# Patient Record
Sex: Female | Born: 1970 | Race: Black or African American | Hispanic: No | Marital: Single | Smoking: Never smoker
Health system: Southern US, Community
[De-identification: ages and names within clinical notes are randomized; demographics above are authoritative.]

## PROBLEM LIST (undated history)

## (undated) DIAGNOSIS — C55 Malignant neoplasm of uterus, part unspecified: Secondary | ICD-10-CM

## (undated) DIAGNOSIS — K56609 Unspecified intestinal obstruction, unspecified as to partial versus complete obstruction: Secondary | ICD-10-CM

## (undated) DIAGNOSIS — O1490 Unspecified pre-eclampsia, unspecified trimester: Secondary | ICD-10-CM

## (undated) DIAGNOSIS — K567 Ileus, unspecified: Secondary | ICD-10-CM

## (undated) DIAGNOSIS — I10 Essential (primary) hypertension: Secondary | ICD-10-CM

## (undated) DIAGNOSIS — Z8759 Personal history of other complications of pregnancy, childbirth and the puerperium: Secondary | ICD-10-CM

## (undated) DIAGNOSIS — D509 Iron deficiency anemia, unspecified: Secondary | ICD-10-CM

## (undated) DIAGNOSIS — K9189 Other postprocedural complications and disorders of digestive system: Secondary | ICD-10-CM

## (undated) HISTORY — DX: Ileus, unspecified: K56.7

## (undated) HISTORY — DX: Iron deficiency anemia, unspecified: D50.9

## (undated) HISTORY — DX: Other postprocedural complications and disorders of digestive system: K91.89

## (undated) HISTORY — PX: OTHER SURGICAL HISTORY: SHX169

## (undated) HISTORY — DX: Unspecified pre-eclampsia, unspecified trimester: O14.90

## (undated) HISTORY — DX: Personal history of other complications of pregnancy, childbirth and the puerperium: Z87.59

## (undated) HISTORY — PX: APPENDECTOMY: SHX54

---

## 1999-12-09 ENCOUNTER — Emergency Department (HOSPITAL_COMMUNITY): Admission: EM | Admit: 1999-12-09 | Discharge: 1999-12-09 | Payer: Self-pay | Admitting: Emergency Medicine

## 2000-03-24 ENCOUNTER — Emergency Department (HOSPITAL_COMMUNITY): Admission: EM | Admit: 2000-03-24 | Discharge: 2000-03-24 | Payer: Self-pay | Admitting: Emergency Medicine

## 2000-07-28 ENCOUNTER — Other Ambulatory Visit: Admission: RE | Admit: 2000-07-28 | Discharge: 2000-07-28 | Payer: Self-pay | Admitting: *Deleted

## 2000-09-27 ENCOUNTER — Ambulatory Visit (HOSPITAL_COMMUNITY): Admission: RE | Admit: 2000-09-27 | Discharge: 2000-09-27 | Payer: Self-pay | Admitting: *Deleted

## 2000-09-27 ENCOUNTER — Encounter: Payer: Self-pay | Admitting: *Deleted

## 2000-11-01 ENCOUNTER — Encounter: Payer: Self-pay | Admitting: Family Medicine

## 2000-11-01 ENCOUNTER — Encounter: Admission: RE | Admit: 2000-11-01 | Discharge: 2000-11-01 | Payer: Self-pay | Admitting: Family Medicine

## 2001-01-16 ENCOUNTER — Encounter: Payer: Self-pay | Admitting: *Deleted

## 2001-01-16 ENCOUNTER — Inpatient Hospital Stay (HOSPITAL_COMMUNITY): Admission: AD | Admit: 2001-01-16 | Discharge: 2001-01-16 | Payer: Self-pay | Admitting: *Deleted

## 2001-02-19 ENCOUNTER — Emergency Department (HOSPITAL_COMMUNITY): Admission: EM | Admit: 2001-02-19 | Discharge: 2001-02-19 | Payer: Self-pay | Admitting: Emergency Medicine

## 2001-08-30 ENCOUNTER — Inpatient Hospital Stay (HOSPITAL_COMMUNITY): Admission: AD | Admit: 2001-08-30 | Discharge: 2001-08-30 | Payer: Self-pay | Admitting: Obstetrics and Gynecology

## 2001-09-08 ENCOUNTER — Inpatient Hospital Stay (HOSPITAL_COMMUNITY): Admission: AD | Admit: 2001-09-08 | Discharge: 2001-09-13 | Payer: Self-pay | Admitting: Obstetrics and Gynecology

## 2001-09-16 ENCOUNTER — Inpatient Hospital Stay (HOSPITAL_COMMUNITY): Admission: AD | Admit: 2001-09-16 | Discharge: 2001-09-16 | Payer: Self-pay | Admitting: Obstetrics and Gynecology

## 2001-10-28 ENCOUNTER — Emergency Department (HOSPITAL_COMMUNITY): Admission: EM | Admit: 2001-10-28 | Discharge: 2001-10-28 | Payer: Self-pay | Admitting: *Deleted

## 2001-12-17 ENCOUNTER — Emergency Department (HOSPITAL_COMMUNITY): Admission: EM | Admit: 2001-12-17 | Discharge: 2001-12-17 | Payer: Self-pay | Admitting: Emergency Medicine

## 2001-12-17 ENCOUNTER — Encounter: Payer: Self-pay | Admitting: Emergency Medicine

## 2002-02-13 ENCOUNTER — Emergency Department (HOSPITAL_COMMUNITY): Admission: EM | Admit: 2002-02-13 | Discharge: 2002-02-13 | Payer: Self-pay | Admitting: Emergency Medicine

## 2002-07-28 ENCOUNTER — Emergency Department (HOSPITAL_COMMUNITY): Admission: EM | Admit: 2002-07-28 | Discharge: 2002-07-28 | Payer: Self-pay | Admitting: Emergency Medicine

## 2002-08-25 ENCOUNTER — Emergency Department (HOSPITAL_COMMUNITY): Admission: EM | Admit: 2002-08-25 | Discharge: 2002-08-26 | Payer: Self-pay | Admitting: Emergency Medicine

## 2003-01-06 ENCOUNTER — Emergency Department (HOSPITAL_COMMUNITY): Admission: EM | Admit: 2003-01-06 | Discharge: 2003-01-07 | Payer: Self-pay | Admitting: Emergency Medicine

## 2003-12-31 ENCOUNTER — Inpatient Hospital Stay (HOSPITAL_COMMUNITY): Admission: AD | Admit: 2003-12-31 | Discharge: 2004-01-02 | Payer: Self-pay | Admitting: Obstetrics and Gynecology

## 2004-01-28 ENCOUNTER — Inpatient Hospital Stay (HOSPITAL_COMMUNITY): Admission: AD | Admit: 2004-01-28 | Discharge: 2004-01-28 | Payer: Self-pay | Admitting: Obstetrics and Gynecology

## 2004-01-30 ENCOUNTER — Other Ambulatory Visit: Admission: RE | Admit: 2004-01-30 | Discharge: 2004-01-30 | Payer: Self-pay | Admitting: Obstetrics and Gynecology

## 2004-03-26 ENCOUNTER — Ambulatory Visit (HOSPITAL_COMMUNITY): Admission: RE | Admit: 2004-03-26 | Discharge: 2004-03-26 | Payer: Self-pay | Admitting: Obstetrics and Gynecology

## 2004-04-09 ENCOUNTER — Ambulatory Visit (HOSPITAL_COMMUNITY): Admission: RE | Admit: 2004-04-09 | Discharge: 2004-04-09 | Payer: Self-pay | Admitting: Obstetrics and Gynecology

## 2004-07-25 ENCOUNTER — Inpatient Hospital Stay (HOSPITAL_COMMUNITY): Admission: AD | Admit: 2004-07-25 | Discharge: 2004-08-03 | Payer: Self-pay | Admitting: Obstetrics and Gynecology

## 2004-07-27 ENCOUNTER — Encounter (INDEPENDENT_AMBULATORY_CARE_PROVIDER_SITE_OTHER): Payer: Self-pay | Admitting: *Deleted

## 2004-10-27 ENCOUNTER — Emergency Department (HOSPITAL_COMMUNITY): Admission: EM | Admit: 2004-10-27 | Discharge: 2004-10-27 | Payer: Self-pay | Admitting: Emergency Medicine

## 2004-10-31 ENCOUNTER — Emergency Department (HOSPITAL_COMMUNITY): Admission: EM | Admit: 2004-10-31 | Discharge: 2004-11-01 | Payer: Self-pay | Admitting: Emergency Medicine

## 2005-02-27 ENCOUNTER — Emergency Department (HOSPITAL_COMMUNITY): Admission: EM | Admit: 2005-02-27 | Discharge: 2005-02-27 | Payer: Self-pay | Admitting: Emergency Medicine

## 2005-06-04 ENCOUNTER — Ambulatory Visit: Payer: Self-pay | Admitting: Nurse Practitioner

## 2005-06-05 ENCOUNTER — Ambulatory Visit (HOSPITAL_COMMUNITY): Admission: RE | Admit: 2005-06-05 | Discharge: 2005-06-05 | Payer: Self-pay | Admitting: Internal Medicine

## 2005-08-06 ENCOUNTER — Ambulatory Visit: Payer: Self-pay | Admitting: Nurse Practitioner

## 2005-08-07 ENCOUNTER — Ambulatory Visit: Payer: Self-pay | Admitting: Nurse Practitioner

## 2005-08-07 ENCOUNTER — Ambulatory Visit: Payer: Self-pay | Admitting: *Deleted

## 2005-09-03 ENCOUNTER — Ambulatory Visit: Payer: Self-pay | Admitting: Internal Medicine

## 2005-11-12 ENCOUNTER — Ambulatory Visit: Payer: Self-pay | Admitting: Nurse Practitioner

## 2006-02-23 ENCOUNTER — Ambulatory Visit: Payer: Self-pay | Admitting: Nurse Practitioner

## 2006-03-04 ENCOUNTER — Ambulatory Visit: Payer: Self-pay | Admitting: Nurse Practitioner

## 2006-03-18 ENCOUNTER — Ambulatory Visit: Payer: Self-pay | Admitting: Nurse Practitioner

## 2006-03-18 ENCOUNTER — Ambulatory Visit (HOSPITAL_COMMUNITY): Admission: RE | Admit: 2006-03-18 | Discharge: 2006-03-18 | Payer: Self-pay | Admitting: Nurse Practitioner

## 2006-09-29 ENCOUNTER — Encounter (INDEPENDENT_AMBULATORY_CARE_PROVIDER_SITE_OTHER): Payer: Self-pay | Admitting: *Deleted

## 2007-10-29 ENCOUNTER — Emergency Department (HOSPITAL_COMMUNITY): Admission: EM | Admit: 2007-10-29 | Discharge: 2007-10-30 | Payer: Self-pay | Admitting: Emergency Medicine

## 2007-12-31 ENCOUNTER — Emergency Department (HOSPITAL_COMMUNITY): Admission: EM | Admit: 2007-12-31 | Discharge: 2007-12-31 | Payer: Self-pay | Admitting: Emergency Medicine

## 2009-04-14 ENCOUNTER — Inpatient Hospital Stay (HOSPITAL_COMMUNITY): Admission: AD | Admit: 2009-04-14 | Discharge: 2009-04-14 | Payer: Self-pay | Admitting: Obstetrics and Gynecology

## 2009-05-09 ENCOUNTER — Inpatient Hospital Stay (HOSPITAL_COMMUNITY): Admission: AD | Admit: 2009-05-09 | Discharge: 2009-05-09 | Payer: Self-pay | Admitting: Obstetrics & Gynecology

## 2009-05-15 ENCOUNTER — Inpatient Hospital Stay (HOSPITAL_COMMUNITY): Admission: AD | Admit: 2009-05-15 | Discharge: 2009-05-15 | Payer: Self-pay | Admitting: Family Medicine

## 2009-05-15 ENCOUNTER — Ambulatory Visit: Payer: Self-pay | Admitting: Advanced Practice Midwife

## 2009-05-23 ENCOUNTER — Ambulatory Visit: Payer: Self-pay | Admitting: Obstetrics & Gynecology

## 2009-05-23 LAB — CONVERTED CEMR LAB
ALT: 11 units/L (ref 0–35)
AST: 14 units/L (ref 0–37)
Albumin: 4 g/dL (ref 3.5–5.2)
Alkaline Phosphatase: 58 units/L (ref 39–117)
Calcium: 8.8 mg/dL (ref 8.4–10.5)
Chloride: 105 meq/L (ref 96–112)
Eosinophils Relative: 2 % (ref 0–5)
HCT: 36.3 % (ref 36.0–46.0)
Hepatitis B Surface Ag: NEGATIVE
Hgb A2 Quant: 2.8 % (ref 2.2–3.2)
Hgb A: 97.2 % (ref 96.8–97.8)
Lymphocytes Relative: 28 % (ref 12–46)
MCV: 83.4 fL (ref 78.0–100.0)
Monocytes Absolute: 0.4 10*3/uL (ref 0.1–1.0)
Monocytes Relative: 5 % (ref 3–12)
Neutrophils Relative %: 65 % (ref 43–77)
Potassium: 3.8 meq/L (ref 3.5–5.3)
RDW: 15.1 % (ref 11.5–15.5)
Sodium: 135 meq/L (ref 135–145)
Total Bilirubin: 0.2 mg/dL — ABNORMAL LOW (ref 0.3–1.2)
Total Protein: 7.4 g/dL (ref 6.0–8.3)
Uric Acid, Serum: 3.6 mg/dL (ref 2.4–7.0)
WBC: 8 10*3/uL (ref 4.0–10.5)

## 2009-05-24 ENCOUNTER — Encounter: Payer: Self-pay | Admitting: Obstetrics & Gynecology

## 2009-05-24 LAB — CONVERTED CEMR LAB
Creatinine 24 HR UR: 778 mg/24hr (ref 700–1800)
Creatinine Clearance: 83 mL/min (ref 75–115)
Creatinine, Urine: 88.9 mg/dL

## 2009-05-25 ENCOUNTER — Inpatient Hospital Stay (HOSPITAL_COMMUNITY): Admission: AD | Admit: 2009-05-25 | Discharge: 2009-05-25 | Payer: Self-pay | Admitting: Obstetrics and Gynecology

## 2009-06-06 ENCOUNTER — Ambulatory Visit: Payer: Self-pay | Admitting: Obstetrics & Gynecology

## 2009-06-26 ENCOUNTER — Ambulatory Visit: Payer: Self-pay | Admitting: Obstetrics and Gynecology

## 2009-07-03 ENCOUNTER — Ambulatory Visit: Payer: Self-pay | Admitting: Obstetrics and Gynecology

## 2009-07-12 ENCOUNTER — Encounter: Payer: Self-pay | Admitting: Family Medicine

## 2009-07-12 ENCOUNTER — Ambulatory Visit (HOSPITAL_COMMUNITY): Admission: RE | Admit: 2009-07-12 | Discharge: 2009-07-12 | Payer: Self-pay | Admitting: Family Medicine

## 2009-07-19 ENCOUNTER — Ambulatory Visit (HOSPITAL_COMMUNITY): Admission: RE | Admit: 2009-07-19 | Discharge: 2009-07-19 | Payer: Self-pay | Admitting: Family Medicine

## 2009-07-19 ENCOUNTER — Encounter: Payer: Self-pay | Admitting: Family Medicine

## 2009-07-24 ENCOUNTER — Ambulatory Visit: Payer: Self-pay | Admitting: Obstetrics and Gynecology

## 2009-07-25 ENCOUNTER — Encounter: Payer: Self-pay | Admitting: Family Medicine

## 2009-07-25 ENCOUNTER — Ambulatory Visit: Payer: Self-pay | Admitting: Obstetrics and Gynecology

## 2009-07-25 ENCOUNTER — Inpatient Hospital Stay (HOSPITAL_COMMUNITY)
Admission: AD | Admit: 2009-07-25 | Discharge: 2009-07-27 | Payer: Self-pay | Source: Home / Self Care | Admitting: Obstetrics and Gynecology

## 2009-08-01 ENCOUNTER — Ambulatory Visit: Payer: Self-pay | Admitting: Family Medicine

## 2009-08-08 ENCOUNTER — Ambulatory Visit: Payer: Self-pay | Admitting: Obstetrics & Gynecology

## 2009-08-08 ENCOUNTER — Ambulatory Visit (HOSPITAL_COMMUNITY): Admission: RE | Admit: 2009-08-08 | Discharge: 2009-08-08 | Payer: Self-pay | Admitting: Family Medicine

## 2009-08-22 ENCOUNTER — Ambulatory Visit: Payer: Self-pay | Admitting: Obstetrics & Gynecology

## 2009-08-29 ENCOUNTER — Encounter: Payer: Self-pay | Admitting: Family Medicine

## 2009-08-29 ENCOUNTER — Ambulatory Visit (HOSPITAL_COMMUNITY): Admission: RE | Admit: 2009-08-29 | Discharge: 2009-08-29 | Payer: Self-pay | Admitting: Family Medicine

## 2009-08-29 ENCOUNTER — Ambulatory Visit: Payer: Self-pay | Admitting: Obstetrics & Gynecology

## 2009-09-26 ENCOUNTER — Ambulatory Visit: Payer: Self-pay | Admitting: Family Medicine

## 2009-09-26 ENCOUNTER — Encounter: Payer: Self-pay | Admitting: Obstetrics and Gynecology

## 2009-09-30 ENCOUNTER — Ambulatory Visit (HOSPITAL_COMMUNITY): Admission: RE | Admit: 2009-09-30 | Discharge: 2009-09-30 | Payer: Self-pay | Admitting: Family Medicine

## 2009-10-07 ENCOUNTER — Ambulatory Visit: Payer: Self-pay | Admitting: Obstetrics & Gynecology

## 2009-10-10 ENCOUNTER — Ambulatory Visit: Payer: Self-pay | Admitting: Obstetrics & Gynecology

## 2009-10-14 ENCOUNTER — Encounter (INDEPENDENT_AMBULATORY_CARE_PROVIDER_SITE_OTHER): Payer: Self-pay | Admitting: *Deleted

## 2009-10-14 ENCOUNTER — Ambulatory Visit (HOSPITAL_COMMUNITY): Admission: RE | Admit: 2009-10-14 | Discharge: 2009-10-14 | Payer: Self-pay | Admitting: Family Medicine

## 2009-10-14 ENCOUNTER — Ambulatory Visit: Payer: Self-pay | Admitting: Obstetrics & Gynecology

## 2009-10-14 LAB — CONVERTED CEMR LAB
ALT: 10 units/L (ref 0–35)
AST: 13 units/L (ref 0–37)
Albumin: 3.4 g/dL — ABNORMAL LOW (ref 3.5–5.2)
Alkaline Phosphatase: 142 units/L — ABNORMAL HIGH (ref 39–117)
Chloride: 107 meq/L (ref 96–112)
Creatinine, Ser: 0.62 mg/dL (ref 0.40–1.20)
Creatinine, Urine: 127.3 mg/dL
HCT: 30.8 % — ABNORMAL LOW (ref 36.0–46.0)
Hemoglobin: 9.9 g/dL — ABNORMAL LOW (ref 12.0–15.0)
MCHC: 32.1 g/dL (ref 30.0–36.0)
Platelets: 243 10*3/uL (ref 150–400)
Protein, Ur: 63 mg/24hr (ref 50–100)
Sodium: 138 meq/L (ref 135–145)
Total Bilirubin: 0.3 mg/dL (ref 0.3–1.2)
Uric Acid, Serum: 5.3 mg/dL (ref 2.4–7.0)
WBC: 12.5 10*3/uL — ABNORMAL HIGH (ref 4.0–10.5)

## 2009-10-17 ENCOUNTER — Ambulatory Visit: Payer: Self-pay | Admitting: Obstetrics & Gynecology

## 2009-10-17 ENCOUNTER — Encounter: Payer: Self-pay | Admitting: Family Medicine

## 2009-10-17 ENCOUNTER — Observation Stay (HOSPITAL_COMMUNITY): Admission: AD | Admit: 2009-10-17 | Discharge: 2009-10-18 | Payer: Self-pay | Admitting: Obstetrics & Gynecology

## 2009-10-21 ENCOUNTER — Ambulatory Visit: Payer: Self-pay | Admitting: Obstetrics and Gynecology

## 2009-10-22 ENCOUNTER — Encounter: Payer: Self-pay | Admitting: Family Medicine

## 2009-10-22 ENCOUNTER — Ambulatory Visit (HOSPITAL_COMMUNITY): Admission: RE | Admit: 2009-10-22 | Discharge: 2009-10-22 | Payer: Self-pay | Admitting: Family Medicine

## 2009-10-24 ENCOUNTER — Ambulatory Visit (HOSPITAL_COMMUNITY): Admission: RE | Admit: 2009-10-24 | Discharge: 2009-10-24 | Payer: Self-pay | Admitting: Family Medicine

## 2009-10-24 ENCOUNTER — Ambulatory Visit: Payer: Self-pay | Admitting: Family Medicine

## 2009-10-29 ENCOUNTER — Ambulatory Visit (HOSPITAL_COMMUNITY): Admission: RE | Admit: 2009-10-29 | Discharge: 2009-10-29 | Payer: Self-pay | Admitting: Family Medicine

## 2009-10-31 ENCOUNTER — Ambulatory Visit: Payer: Self-pay | Admitting: Family Medicine

## 2009-11-04 ENCOUNTER — Ambulatory Visit: Payer: Self-pay | Admitting: Family Medicine

## 2009-11-05 ENCOUNTER — Ambulatory Visit (HOSPITAL_COMMUNITY): Admission: RE | Admit: 2009-11-05 | Discharge: 2009-11-05 | Payer: Self-pay | Admitting: Obstetrics & Gynecology

## 2009-11-07 ENCOUNTER — Ambulatory Visit: Payer: Self-pay | Admitting: Obstetrics & Gynecology

## 2009-11-07 LAB — CONVERTED CEMR LAB
Albumin: 3.3 g/dL — ABNORMAL LOW (ref 3.5–5.2)
BUN: 7 mg/dL (ref 6–23)
CO2: 20 meq/L (ref 19–32)
Calcium: 8.5 mg/dL (ref 8.4–10.5)
Chlamydia, DNA Probe: NEGATIVE
Chloride: 106 meq/L (ref 96–112)
Creatinine, Ser: 0.64 mg/dL (ref 0.40–1.20)
GC Probe Amp, Genital: NEGATIVE
Glucose, Bld: 68 mg/dL — ABNORMAL LOW (ref 70–99)
Hemoglobin: 9.7 g/dL — ABNORMAL LOW (ref 12.0–15.0)
MCHC: 30.2 g/dL (ref 30.0–36.0)
MCV: 85.8 fL (ref 78.0–100.0)
Potassium: 4.4 meq/L (ref 3.5–5.3)
Total Bilirubin: 0.4 mg/dL (ref 0.3–1.2)
WBC: 8.6 10*3/uL (ref 4.0–10.5)

## 2009-11-08 ENCOUNTER — Encounter: Payer: Self-pay | Admitting: Obstetrics & Gynecology

## 2009-11-08 LAB — CONVERTED CEMR LAB
Collection Interval-CRCL: 24 hr
Creatinine 24 HR UR: 862 mg/24hr (ref 700–1800)

## 2009-11-12 ENCOUNTER — Inpatient Hospital Stay (HOSPITAL_COMMUNITY)
Admission: AD | Admit: 2009-11-12 | Discharge: 2009-11-18 | Payer: Self-pay | Source: Home / Self Care | Admitting: Obstetrics and Gynecology

## 2009-11-12 ENCOUNTER — Ambulatory Visit: Payer: Self-pay | Admitting: Obstetrics and Gynecology

## 2009-11-12 ENCOUNTER — Encounter: Payer: Self-pay | Admitting: Obstetrics & Gynecology

## 2009-11-13 ENCOUNTER — Encounter: Payer: Self-pay | Admitting: Obstetrics and Gynecology

## 2009-12-04 ENCOUNTER — Inpatient Hospital Stay (HOSPITAL_COMMUNITY): Admission: AD | Admit: 2009-12-04 | Discharge: 2009-12-04 | Payer: Self-pay | Admitting: Obstetrics and Gynecology

## 2009-12-18 ENCOUNTER — Ambulatory Visit: Payer: Self-pay | Admitting: Obstetrics and Gynecology

## 2010-02-01 ENCOUNTER — Encounter: Payer: Self-pay | Admitting: Obstetrics & Gynecology

## 2010-02-02 ENCOUNTER — Encounter: Payer: Self-pay | Admitting: Family Medicine

## 2010-03-25 LAB — TYPE AND SCREEN
Antibody Screen: NEGATIVE
Unit division: 0

## 2010-03-25 LAB — CBC
HCT: 24.6 % — ABNORMAL LOW (ref 36.0–46.0)
HCT: 28.3 % — ABNORMAL LOW (ref 36.0–46.0)
HCT: 31.8 % — ABNORMAL LOW (ref 36.0–46.0)
Hemoglobin: 8 g/dL — ABNORMAL LOW (ref 12.0–15.0)
MCH: 27.4 pg (ref 26.0–34.0)
MCH: 27.4 pg (ref 26.0–34.0)
MCHC: 32.5 g/dL (ref 30.0–36.0)
MCHC: 32.6 g/dL (ref 30.0–36.0)
MCHC: 32.8 g/dL (ref 30.0–36.0)
MCV: 84 fL (ref 78.0–100.0)
MCV: 84.1 fL (ref 78.0–100.0)
Platelets: 208 10*3/uL (ref 150–400)
Platelets: 238 10*3/uL (ref 150–400)
RBC: 2.96 MIL/uL — ABNORMAL LOW (ref 3.87–5.11)
RBC: 3.83 MIL/uL — ABNORMAL LOW (ref 3.87–5.11)
WBC: 10.5 10*3/uL (ref 4.0–10.5)
WBC: 10.8 10*3/uL — ABNORMAL HIGH (ref 4.0–10.5)

## 2010-03-25 LAB — COMPREHENSIVE METABOLIC PANEL
ALT: 15 U/L (ref 0–35)
Albumin: 2.7 g/dL — ABNORMAL LOW (ref 3.5–5.2)
Alkaline Phosphatase: 109 U/L (ref 39–117)
Alkaline Phosphatase: 154 U/L — ABNORMAL HIGH (ref 39–117)
BUN: 7 mg/dL (ref 6–23)
CO2: 23 mEq/L (ref 19–32)
Chloride: 100 mEq/L (ref 96–112)
Chloride: 105 mEq/L (ref 96–112)
Creatinine, Ser: 0.6 mg/dL (ref 0.4–1.2)
Creatinine, Ser: 0.6 mg/dL (ref 0.4–1.2)
GFR calc Af Amer: >60 mL/min (ref >60)
GFR calc non Af Amer: >60 mL/min (ref >60)
Glucose, Bld: 68 mg/dL — ABNORMAL LOW (ref 70–99)
Glucose, Bld: 77 mg/dL (ref 70–99)
Total Protein: 6.1 g/dL (ref 6.0–8.3)

## 2010-03-25 LAB — URINALYSIS, ROUTINE W REFLEX MICROSCOPIC
Glucose, UA: NEGATIVE mg/dL
Ketones, ur: NEGATIVE mg/dL
Protein, ur: NEGATIVE mg/dL
Specific Gravity, Urine: 1.015 (ref 1.005–1.030)
Urobilinogen, UA: 0.2 mg/dL (ref 0.0–1.0)
pH: 7 (ref 5.0–8.0)

## 2010-03-25 LAB — PROTEIN / CREATININE RATIO, URINE: Protein Creatinine Ratio: 0.46 — ABNORMAL HIGH (ref 0.00–0.15)

## 2010-03-26 LAB — COMPREHENSIVE METABOLIC PANEL
ALT: 12 U/L (ref 0–35)
ALT: 15 U/L (ref 0–35)
AST: 15 U/L (ref 0–37)
Albumin: 2.4 g/dL — ABNORMAL LOW (ref 3.5–5.2)
Albumin: 2.6 g/dL — ABNORMAL LOW (ref 3.5–5.2)
Alkaline Phosphatase: 121 U/L — ABNORMAL HIGH (ref 39–117)
Alkaline Phosphatase: 133 U/L — ABNORMAL HIGH (ref 39–117)
BUN: 7 mg/dL (ref 6–23)
CO2: 23 mEq/L (ref 19–32)
Calcium: 8.8 mg/dL (ref 8.4–10.5)
Chloride: 105 mEq/L (ref 96–112)
Creatinine, Ser: 0.61 mg/dL (ref 0.4–1.2)
GFR calc Af Amer: >60 mL/min (ref >60)
GFR calc non Af Amer: >60 mL/min (ref >60)
Glucose, Bld: 80 mg/dL (ref 70–99)
Potassium: 3.7 mEq/L (ref 3.5–5.1)
Potassium: 3.8 mEq/L (ref 3.5–5.1)
Sodium: 134 mEq/L — ABNORMAL LOW (ref 135–145)
Sodium: 135 mEq/L (ref 135–145)
Total Bilirubin: 0.5 mg/dL (ref 0.3–1.2)
Total Protein: 5.8 g/dL — ABNORMAL LOW (ref 6.0–8.3)
Total Protein: 6.4 g/dL (ref 6.0–8.3)

## 2010-03-26 LAB — CBC
HCT: 29.8 % — ABNORMAL LOW (ref 36.0–46.0)
Hemoglobin: 9.9 g/dL — ABNORMAL LOW (ref 12.0–15.0)
MCH: 28 pg (ref 26.0–34.0)
MCHC: 33.2 g/dL (ref 30.0–36.0)
MCV: 84.4 fL (ref 78.0–100.0)
Platelets: 191 10*3/uL (ref 150–400)
Platelets: 211 10*3/uL (ref 150–400)
RBC: 3.53 MIL/uL — ABNORMAL LOW (ref 3.87–5.11)
RDW: 15.1 % (ref 11.5–15.5)
RDW: 15.8 % — ABNORMAL HIGH (ref 11.5–15.5)
WBC: 11 10*3/uL — ABNORMAL HIGH (ref 4.0–10.5)
WBC: 11.9 10*3/uL — ABNORMAL HIGH (ref 4.0–10.5)

## 2010-03-26 LAB — POCT URINALYSIS DIPSTICK
Bilirubin Urine: NEGATIVE
Bilirubin Urine: NEGATIVE
Bilirubin Urine: NEGATIVE
Glucose, UA: NEGATIVE mg/dL
Glucose, UA: NEGATIVE mg/dL
Hgb urine dipstick: NEGATIVE
Hgb urine dipstick: NEGATIVE
Hgb urine dipstick: NEGATIVE
Hgb urine dipstick: NEGATIVE
Ketones, ur: NEGATIVE mg/dL
Nitrite: NEGATIVE
Nitrite: NEGATIVE
Nitrite: NEGATIVE
Nitrite: NEGATIVE
Protein, ur: NEGATIVE mg/dL
Protein, ur: NEGATIVE mg/dL
Protein, ur: NEGATIVE mg/dL
Specific Gravity, Urine: 1.02 (ref 1.005–1.030)
Specific Gravity, Urine: 1.02 (ref 1.005–1.030)
Urobilinogen, UA: 0.2 mg/dL (ref 0.0–1.0)
Urobilinogen, UA: 0.2 mg/dL (ref 0.0–1.0)
Urobilinogen, UA: 0.2 mg/dL (ref 0.0–1.0)
Urobilinogen, UA: 1 mg/dL (ref 0.0–1.0)
pH: 5 (ref 5.0–8.0)
pH: 5.5 (ref 5.0–8.0)
pH: 5.5 (ref 5.0–8.0)

## 2010-03-26 LAB — CREATININE CLEARANCE, URINE, 24 HOUR
Collection Interval-CRCL: 24 hours
Creatinine Clearance: 195 mL/min — ABNORMAL HIGH (ref 75–115)
Creatinine, 24H Ur: 1545 mg/d (ref 700–1800)
Creatinine, Urine: 81.3 mg/dL
Creatinine: 0.55 mg/dL (ref 0.4–1.2)
Urine Total Volume-CRCL: 1900 mL

## 2010-03-26 LAB — PROTEIN, URINE, 24 HOUR
Collection Interval-UPROT: 24 hours
Protein, 24H Urine: 95 mg/d (ref 50–100)
Urine Total Volume-UPROT: 1900 mL

## 2010-03-27 LAB — POCT URINALYSIS DIPSTICK
Bilirubin Urine: NEGATIVE
Glucose, UA: NEGATIVE mg/dL
Hgb urine dipstick: NEGATIVE
Ketones, ur: NEGATIVE mg/dL
Nitrite: NEGATIVE
Nitrite: NEGATIVE
Nitrite: NEGATIVE
Protein, ur: NEGATIVE mg/dL
Protein, ur: NEGATIVE mg/dL
Protein, ur: NEGATIVE mg/dL
Protein, ur: NEGATIVE mg/dL
Specific Gravity, Urine: 1.02 (ref 1.005–1.030)
Urobilinogen, UA: 0.2 mg/dL (ref 0.0–1.0)
Urobilinogen, UA: 0.2 mg/dL (ref 0.0–1.0)
Urobilinogen, UA: 0.2 mg/dL (ref 0.0–1.0)
pH: 5.5 (ref 5.0–8.0)
pH: 5.5 (ref 5.0–8.0)
pH: 6 (ref 5.0–8.0)
pH: 6 (ref 5.0–8.0)

## 2010-03-28 LAB — POCT URINALYSIS DIPSTICK
Nitrite: NEGATIVE
Protein, ur: NEGATIVE mg/dL
Urobilinogen, UA: 0.2 mg/dL (ref 0.0–1.0)
pH: 5.5 (ref 5.0–8.0)

## 2010-03-29 LAB — POCT URINALYSIS DIP (DEVICE)
Bilirubin Urine: NEGATIVE
Glucose, UA: NEGATIVE mg/dL
Glucose, UA: NEGATIVE mg/dL
Nitrite: NEGATIVE
Nitrite: NEGATIVE
Protein, ur: NEGATIVE mg/dL
Urobilinogen, UA: 0.2 mg/dL (ref 0.0–1.0)
Urobilinogen, UA: 0.2 mg/dL (ref 0.0–1.0)

## 2010-03-30 LAB — POCT URINALYSIS DIP (DEVICE)
Bilirubin Urine: NEGATIVE
Bilirubin Urine: NEGATIVE
Glucose, UA: NEGATIVE mg/dL
Ketones, ur: NEGATIVE mg/dL
Nitrite: NEGATIVE
Nitrite: NEGATIVE
Protein, ur: NEGATIVE mg/dL
Protein, ur: 30 mg/dL — AB
Specific Gravity, Urine: 1.025 (ref 1.005–1.030)
pH: 6.5 (ref 5.0–8.0)
pH: 5.5 (ref 5.0–8.0)

## 2010-03-30 LAB — CBC
HCT: 30.1 % — ABNORMAL LOW (ref 36.0–46.0)
Hemoglobin: 10 g/dL — ABNORMAL LOW (ref 12.0–15.0)
MCHC: 33.2 g/dL (ref 30.0–36.0)
RDW: 15.3 % (ref 11.5–15.5)
WBC: 9 10*3/uL (ref 4.0–10.5)

## 2010-03-31 LAB — POCT URINALYSIS DIP (DEVICE)
Bilirubin Urine: NEGATIVE
Glucose, UA: NEGATIVE mg/dL
Hgb urine dipstick: NEGATIVE
Ketones, ur: NEGATIVE mg/dL
Specific Gravity, Urine: 1.02 (ref 1.005–1.030)
Urobilinogen, UA: 0.2 mg/dL (ref 0.0–1.0)

## 2010-04-01 LAB — WET PREP, GENITAL: Trich, Wet Prep: NONE SEEN

## 2010-04-01 LAB — GC/CHLAMYDIA PROBE AMP, GENITAL
Chlamydia, DNA Probe: NEGATIVE
GC Probe Amp, Genital: NEGATIVE

## 2010-04-01 LAB — CBC
MCV: 83 fL (ref 78.0–100.0)
Platelets: 211 10*3/uL (ref 150–400)
WBC: 9 10*3/uL (ref 4.0–10.5)

## 2010-04-01 LAB — POCT URINALYSIS DIP (DEVICE)
Bilirubin Urine: NEGATIVE
Glucose, UA: NEGATIVE mg/dL
Protein, ur: NEGATIVE mg/dL
Urobilinogen, UA: 0.2 mg/dL (ref 0.0–1.0)

## 2010-04-01 LAB — COMPREHENSIVE METABOLIC PANEL
AST: 15 U/L (ref 0–37)
Albumin: 3.2 g/dL — ABNORMAL LOW (ref 3.5–5.2)
Chloride: 107 mEq/L (ref 96–112)
Creatinine, Ser: 0.62 mg/dL (ref 0.4–1.2)
GFR calc Af Amer: >60 mL/min (ref >60)
Potassium: 3.6 mEq/L (ref 3.5–5.1)
Total Bilirubin: 0.3 mg/dL (ref 0.3–1.2)
Total Protein: 6.6 g/dL (ref 6.0–8.3)

## 2010-04-01 LAB — URINALYSIS, ROUTINE W REFLEX MICROSCOPIC
Bilirubin Urine: NEGATIVE
Glucose, UA: NEGATIVE mg/dL
Hgb urine dipstick: NEGATIVE
Specific Gravity, Urine: 1.025 (ref 1.005–1.030)
Urobilinogen, UA: 0.2 mg/dL (ref 0.0–1.0)

## 2010-04-02 LAB — CBC
HCT: 33.1 % — ABNORMAL LOW (ref 36.0–46.0)
Hemoglobin: 10.8 g/dL — ABNORMAL LOW (ref 12.0–15.0)
MCHC: 32.5 g/dL (ref 30.0–36.0)
MCV: 82.7 fL (ref 78.0–100.0)
Platelets: 232 10*3/uL (ref 150–400)
RDW: 15.7 % — ABNORMAL HIGH (ref 11.5–15.5)

## 2010-04-02 LAB — URINALYSIS, ROUTINE W REFLEX MICROSCOPIC
Bilirubin Urine: NEGATIVE
Hgb urine dipstick: NEGATIVE
Ketones, ur: NEGATIVE mg/dL
Nitrite: NEGATIVE
Protein, ur: NEGATIVE mg/dL
Specific Gravity, Urine: 1.01 (ref 1.005–1.030)
Urobilinogen, UA: 0.2 mg/dL (ref 0.0–1.0)

## 2010-04-02 LAB — WET PREP, GENITAL

## 2010-05-30 NOTE — H&P (Signed)
Krystal [name], Krystal [name]               ACCOUNT NO.:  [id number]   MEDICAL RECORD NO.:  [id number]          PATIENT TYPE:  MAT   LOCATION:  MATC                          [facility]   PHYSICIAN:  Osborn Coho, M.D.   DATE OF BIRTH:  07/27/1970   DATE OF ADMISSION:  07/25/2004  DATE OF DISCHARGE:                                HISTORY & PHYSICAL   HISTORY OF PRESENT ILLNESS:  This is a 40 year old gravida 2, para 1-0-0-1,  at 71 and 5/7ths weeks who presents for ultrasound and decreased fetal  movement.  She was one hour late for the office ultrasound for size less  than dates and was sent here when she stated that the baby had not moved all  day.  She denies leaking or bleeding, and reports positive fetal movement.  She does report seeing stars for the last 1-2 weeks.  Denies headache.  She  reports that she had elevated blood pressure 2 weeks ago, and a normal blood  pressure last week.  The pregnancy has been followed by Dr. Estanislado Pandy and  remarkable for (1) AMA, (2) previous cesarean section, (3) history of PIH,  (4) history of malaria, (5) history of hyperemesis.   OBSTETRIC HISTORY:  Remarkable for primary low transverse cesarean section  in 2003 of a female infant at [redacted] weeks gestation, weighing 7 pounds, 7  ounces, remarkable for induction secondary to pregnancy-induced hypertension  and non-reassuring fetal heart rate.   MEDICAL HISTORY:  1.  Remarkable for pregnancy-induced hypertension with first pregnancy.  2.  Unknown varicella status.  3.  History of malaria numerous times during her lifetime.   SURGICAL HISTORY:  1.  Appendectomy in 1985.  2.  Cesarean section in 2003.   FAMILY HISTORY:  Remarkable for mother with hypertension.   GENETIC HISTORY:  Unremarkable.   SOCIAL HISTORY:  The patient is married to [facility], who is involved and  supportive.  She is of the [place] faith.  She is from [place], [place] and  speaks [place] and [place], as well as [place].   She works in [employer],  and her husband works in Office manager.  She denies any alcohol, tobacco, or drug  use.   PRENATAL LABORATORIES:  Hemoglobin 10.4, platelets 305.  Blood type B+.  Antibody screen negative.  Sickle cell negative.  RPR nonreactive.  Rubella  immune.  Hepatitis negative.  HIV negative.  Gonorrhea negative.  Chlamydia  negative.   HISTORY OF CURRENT PREGNANCY:  The patient entered care at [redacted] weeks  gestation.  She discussed amniocentesis with Dr. Estanislado Pandy at 14 weeks and  declined.  She was placed on Zofran for nausea and vomiting.  She had an  ultrasound at 18 weeks, which was normal, and a follow up one at 20 weeks to  complete the anatomy screen.  She did sign a VBAC consent.  She had an  elevated blood pressure 2 weeks ago, and a normal blood pressure last week.   OBJECTIVE DATA:  VITAL SIGNS:  Stable.  Blood pressures are 141-152/92-94.  HEENT:  Within normal limits.  Thyroid normal and  enlarged.  CHEST:  Clear to auscultation.  HEART:  Regular rate and rhythm.  ABDOMEN:  Gravid at 35 35 cm.  Vertex to Leopold's.  EFM shows a reactive  fetal heart rate with uterine irritability.  PELVIC:  Cervix exam deferred for now, but measures 3.0 cm long on  ultrasound.  Ultrasound shows single intrauterine pregnancy with growth at  75th to 95th percentile, cephalic presentation, AFI of 9.2.  Cervix 3.0,  long.   LABORATORY DATA:  Biophysical profile is 10/10.  CBC shows a white blood  cell count of 9.6, hemoglobin 11, platelets 247.  Chemistries are pending.  Urine is pending.   ASSESSMENT:  1.  Intrauterine pregnancy at 36 and 5/7ths weeks.  2.  Pregnancy-induced hypertension, rule out preeclampsia.  3.  Reassuring fetal status.   PLAN:  Discussed with Dr. Su Hilt, who wants to admit her to the antenatal  unit for a 24-hour urine measurement and vital sign monitoring.  She will  also remain on fetal monitoring, and further orders will follow.       MLW/MEDQ  D:   07/25/2004  T:  07/25/2004  Job:  [id number]

## 2010-05-30 NOTE — Op Note (Signed)
[name], Krystal [name]               ACCOUNT NO.:  [id number]   MEDICAL RECORD NO.:  [id number]          PATIENT TYPE:  INP   LOCATION:  9371                          [facility]   PHYSICIAN:  Osborn Coho, M.D.   DATE OF BIRTH:  10-23-1970   DATE OF PROCEDURE:  07/27/2004  DATE OF DISCHARGE:                                 OPERATIVE REPORT   PREOPERATIVE DIAGNOSIS:  1.  A 36-week intrauterine pregnancy.  2.  Preeclampsia.  3.  Repeat cesarean section.   POSTOPERATIVE DIAGNOSIS:  1.  A 36-week intrauterine pregnancy.  2.  Preeclampsia.  3.  Repeat cesarean section.   PROCEDURE:  Repeat C-section via Pfannenstiel skin incision.   ANESTHESIA:  Spinal.   ATTENDING:  Osborn Coho, M.D.   ASSISTANT:  [name], C.N.M.   FLUIDS:  1500 mL.   URINE OUTPUT:  400 mL.   ESTIMATED BLOOD LOSS:  500 mL.   COMPLICATIONS:  None.   FINDINGS:  Live female infant, Believe. Apgars 9 at one minute and 9 at  minutes. Normal-appearing bilateral ovaries and fallopian tubes.   PROCEDURE:  The patient was taken to the operating room after risks,  benefits, and alternatives reviewed with the patient. The patient verbalized  understanding and consent signed and witnessed. The patient was given spinal  per anesthesia and prepped and draped in the normal sterile fashion in the  dorsal supine position with a leftward tilt. A Pfannenstiel skin incision  was made at the site of the prior scar and carried down to the underlying  layer of fascia with the scalpel and the Bovie. The fascia was excised  bilaterally in the midline and extended bilaterally with the Mayo scissors.  Kocher clamps were placed on the inferior aspect of the fascial incision and  the rectus muscle excised from the fascia. The same was done on the superior  aspect of the fascial incision. The muscle was separated superiorly in the  midline, however, dense adhesions at the lower aspect were taken down with  the  Bovie and with the Mayo scissors. The peritoneum was entered bluntly and  the peritoneum manually extended. The bladder blade was placed and bladder  flap created with the Metzenbaum scissors. A very thin lower uterine segment  was noted, almost in appearance of a window. A low transverse an incision  was made and extended bilaterally with the bandage scissors. The infant was  delivered without difficulty and suctioned. Cord was clamped and cut and the  infant handed to the awaiting pediatricians. Cord bloods were collected.  Placenta was removed via fundal massage and the uterus cleared of all clots  and debris. The uterine incision was repaired with 0 Vicryl in a running  locked fashion and a second imbricating layer was performed. The adnexal  findings were as noted above. Copious irrigation was performed and the  uterine incision was noted to be hemostatic. The  peritoneum was repaired  with 2-0 chromic in a running fashion. The fascia was repaired with 0 Vicryl  in a running fashion. The subcutaneous tissue was irrigated and made  hemostatic with the Bovie. The skin was reapproximated using staples.  Sponge, lap and needle count was correct. The patient tolerated procedure  well and is awaiting transfer to the recovery room in good condition.       AR/MEDQ  D:  07/27/2004  T:  07/27/2004  Job:  [id number]

## 2010-05-30 NOTE — Discharge Summary (Signed)
Krystal [name], Krystal [name]            ACCOUNT NO.:  [id number]   MEDICAL RECORD NO.:  [id number]          PATIENT TYPE:  INP   LOCATION:  9104                          [facility]   PHYSICIAN:  Naima A. Dillard, M.D. DATE OF BIRTH:  30-Oct-1970   DATE OF ADMISSION:  07/25/2004  DATE OF DISCHARGE:  08/03/2004                                 DISCHARGE SUMMARY   ADMITTING DIAGNOSES:  1.  Intrauterine pregnancy at 35-5/7 weeks.  2.  Preeclampsia.  3.  Previous cesarean section, desires repeat.   PROCEDURE:  Repeat low transverse cesarean section.   DISCHARGE DIAGNOSIS:  1.  Intrauterine pregnancy at 36 weeks, delivered.  2.  Preeclampsia with labile blood pressures, stable.  3.  Repeat cesarean section.   HOSPITAL COURSE:  Krystal [name] is a 40 year old gravida 2, para 1-0-0-1 who  was admitted at 35-5/[redacted] weeks gestation for evaluation of PIH to rule out  preeclampsia.  Preeclampsia was identified, and decision was made per  discussion by Dr. Osborn Coho weighing the risks, benefits and  alternatives to proceed with repeat low transverse cesarean section.  The  patient underwent repeat C-section on July 27, 2004, with Dr. Osborn Coho  as surgeon for the birth of a day a viable female infant, named Krystal [name],  with Apgar scores of nine at one minute and 9 at five minutes.  The patient  has had extremely labile blood pressures in the post operative period.  Her  blood pressure medicine has been adjusted several times following  discontinuation of magnesium sulfate after 24 hours.  The patient is now  taking Procardia 60 mg XL once a day and HCTZ 25 mg p.o. every day., and on  this her postoperative day #7, her vital signs have remained stable in the  past 24 hours.  Her blood pressures have been 130-140s over 80s to 90s.  The  patient is feeling much better with no headaches, visual changes or  epigastric pain.  Her physical exam is within normal limits. Her abdomen is  soft. Her  incision is healing well and she is judged to be in satisfactory  condition for discharge.   DISCHARGE INSTRUCTIONS:  Per [facility] handout.   DISCHARGE MEDICATIONS:  1.  Procardia 60 mg XL p.o. every day.  2.  Hydrochlorothiazide 25 mg p.o. every day.  3.  Motrin 600 mg p.o. q.6 h p.r.n. pain.  4.  Tylox one to two p.o. q.3-4h. pain.   FOLLOWUP:  The patient will follow up with the office of [facility] at the end of  this week for a blood pressure check, and she will call for any further  problems or concerns.      Krystal [name], C.N.M.      Naima A. Normand Sloop, M.D.  Electronically Signed    SDM/MEDQ  D:  08/03/2004  T:  08/03/2004  Job:  [id number]

## 2010-06-11 ENCOUNTER — Ambulatory Visit: Payer: Self-pay | Admitting: Family Medicine

## 2010-06-13 ENCOUNTER — Other Ambulatory Visit: Payer: Self-pay | Admitting: General Practice

## 2010-06-13 ENCOUNTER — Ambulatory Visit
Admission: RE | Admit: 2010-06-13 | Discharge: 2010-06-13 | Disposition: A | Discharge status: Home / Self Care | Payer: Self-pay | Source: Ambulatory Visit | Attending: General Practice | Admitting: General Practice

## 2010-06-13 DIAGNOSIS — R05 Cough: Secondary | ICD-10-CM

## 2010-06-18 ENCOUNTER — Other Ambulatory Visit: Payer: Self-pay | Admitting: Obstetrics and Gynecology

## 2010-06-18 ENCOUNTER — Ambulatory Visit (INDEPENDENT_AMBULATORY_CARE_PROVIDER_SITE_OTHER): Payer: Self-pay | Admitting: Obstetrics and Gynecology

## 2010-06-18 DIAGNOSIS — D259 Leiomyoma of uterus, unspecified: Secondary | ICD-10-CM

## 2010-06-18 DIAGNOSIS — N949 Unspecified condition associated with female genital organs and menstrual cycle: Secondary | ICD-10-CM

## 2010-06-19 ENCOUNTER — Other Ambulatory Visit: Payer: Self-pay | Admitting: Obstetrics and Gynecology

## 2010-06-19 ENCOUNTER — Other Ambulatory Visit (HOSPITAL_COMMUNITY)
Admission: RE | Admit: 2010-06-19 | Discharge: 2010-06-19 | Disposition: A | Discharge status: Home / Self Care | Payer: Self-pay | Source: Ambulatory Visit | Attending: Obstetrics and Gynecology | Admitting: Obstetrics and Gynecology

## 2010-06-19 DIAGNOSIS — D219 Benign neoplasm of connective and other soft tissue, unspecified: Secondary | ICD-10-CM

## 2010-06-19 DIAGNOSIS — C549 Malignant neoplasm of corpus uteri, unspecified: Secondary | ICD-10-CM | POA: Insufficient documentation

## 2010-06-19 NOTE — Group Therapy Note (Signed)
Krystal [name], Krystal [name]               ACCOUNT NO.:  [id number]  MEDICAL RECORD NO.:  [id number]           PATIENT TYPE:  A  LOCATION:  [facility]                   [facility]  [facility]  PHYSICIAN:  [name], MD     DATE OF BIRTH:  29-Jul-1970  DATE OF SERVICE:  06/18/2010                                 CLINIC NOTE  This is a 40 year old G4, P2-1-1-3 who is status post repeat C-section on November 13, 2009, with bilateral tubal ligation who presents today for evaluation of dysfunctional uterine bleeding.  The patient states that since her delivery, she was initially breast-feeding and had not seen her period until the month of February 2012 and since the month of February 2012, she has been experiencing menometrorrhagia as she describes having period that lasts 2 weeks that is heavy, incompetent with clots with 1 week of no bleeding followed by the onset of another period lasting for 2 weeks.  The patient states that she changes pads every 2 hours or so, and they are completely soaked.  At times, the bleeding goes down her leg, soiling her clothes.  The patient also complaining of generalized weakness and fatigue.  Denies any chest pain, shortness of breath, or lightheadedness.  The patient presents today, requesting further evaluation as well as hysterectomy.  PAST MEDICAL HISTORY:  Significant for hypertension.  PAST SURGICAL HISTORY:  She has had a C-section.  PAST OB HISTORY:  She has 3 C-sections.  All of them were complicated by preeclampsia and she has a first trimester miscarriage.  PAST GYN HISTORY:  She denies any cyst.  She does have a history of fibroid uterus and denies any history of abnormal Pap smear.  The patient is using bilateral tubal ligation for birth control.  SOCIAL HISTORY:  She denies drinking, smoking, or the use of illicit drugs.  She lives with her husband and her children.  FAMILY HISTORY:  Noncontributory.  REVIEW OF SYSTEMS:  Otherwise, within  normal limits.  PHYSICAL EXAMINATION:  VITAL SIGNS:  Her blood pressure is 127/80, pulse of 92, weight of 87.1 kg, height of 66 inches. LUNGS:  Clear to auscultation bilaterally. HEART:  Regular rate and rhythm. BREASTS:  Nontender and equal in size.  No palpable masses or lymphadenopathy.  No expressible nipple discharge.  No skin dimpling. ABDOMEN:  Soft, nontender, nondistended. PELVIC:  She had normal-appearing external genitalia.  Normal-appearing vaginal mucosa and cervix.  No abnormal bleeding or discharge. Approximately 14-week sized fibroid uterus.  No palpable adnexal masses or tenderness.  ASSESSMENT AND PLAN:  This is a 40 year old G4, P2-1-1-3, who presents today for evaluation of dysfunctional uterine bleeding.  A Pap smear was performed today.  Endometrial biopsy was also performed with 2 passes, likely not well tolerated by the patient.  The uterine cavity sounded to 7 cm.  Pelvic ultrasound will be ordered to evaluate her fibroid uterus. Medical management with Depo-Provera will be initiated today.  The patient will return to discuss the results of the ultrasound, endometrial biopsy, and planning for hysterectomy.          ______________________________ [name], MD    PC/MEDQ  D:  06/18/2010  T:  06/19/2010  Job:  [id number]

## 2010-06-25 ENCOUNTER — Other Ambulatory Visit (HOSPITAL_COMMUNITY): Payer: Self-pay

## 2010-06-25 ENCOUNTER — Ambulatory Visit (HOSPITAL_COMMUNITY)
Admission: RE | Admit: 2010-06-25 | Discharge: 2010-06-25 | Disposition: A | Discharge status: Home / Self Care | Payer: Self-pay | Source: Ambulatory Visit | Attending: Obstetrics and Gynecology | Admitting: Obstetrics and Gynecology

## 2010-06-25 DIAGNOSIS — D219 Benign neoplasm of connective and other soft tissue, unspecified: Secondary | ICD-10-CM

## 2010-06-25 DIAGNOSIS — N83209 Unspecified ovarian cyst, unspecified side: Secondary | ICD-10-CM | POA: Insufficient documentation

## 2010-06-25 DIAGNOSIS — N938 Other specified abnormal uterine and vaginal bleeding: Secondary | ICD-10-CM | POA: Insufficient documentation

## 2010-06-25 DIAGNOSIS — N852 Hypertrophy of uterus: Secondary | ICD-10-CM | POA: Insufficient documentation

## 2010-06-25 DIAGNOSIS — N949 Unspecified condition associated with female genital organs and menstrual cycle: Secondary | ICD-10-CM | POA: Insufficient documentation

## 2010-06-25 DIAGNOSIS — D251 Intramural leiomyoma of uterus: Secondary | ICD-10-CM | POA: Insufficient documentation

## 2010-06-27 ENCOUNTER — Ambulatory Visit (INDEPENDENT_AMBULATORY_CARE_PROVIDER_SITE_OTHER): Payer: Self-pay | Admitting: Advanced Practice Midwife

## 2010-06-27 DIAGNOSIS — C55 Malignant neoplasm of uterus, part unspecified: Secondary | ICD-10-CM

## 2010-06-27 NOTE — Group Therapy Note (Unsigned)
Krystal [name], PATRIE               ACCOUNT NO.:  [id number]  MEDICAL RECORD NO.:  [id number]           PATIENT TYPE:  A  [facility]ATION:  [facility]                   [facility]  [facility]  PHYSICIAN:  Jaynie Collins, MD     DATE OF BIRTH:  Sep 16, 1970  DATE OF SERVICE:  06/27/2010                                 CLINIC NOTE  REASON FOR VISIT:  The patient is here to discuss recent diagnosis of uterine cancer.  HISTORY OF PRESENT ILLNESS:  The patient is a 40 year old, gravida 4, para 2-1-1-3 who was evaluated for dysfunctional uterine bleeding on June 18, 2010.  During this visit, she had a Pap smear and an endometrial biopsy in addition to pelvic ultrasound and was started on Depo-Provera. 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 Her pelvic ultrasound showed a 15-week sized uterus, focal prominence of the anterior midbody myometrium with areas of irregular shadowing, right posterolateral probable fibroid measuring 2.4 cm in diameter. Endometrium was noted to be 1.4 cm and uniformly echogenic, normal adnexa bilaterally.  Given those findings, the patient was called and told to come in for review of the pathology and for further discussion about management.  PAST MEDICAL HISTORY:  Hypertension.  PAST SURGICAL HISTORY:  Three cesarean sections.  PAST OBSTETRIC/GYNECOLOGICAL HISTORY:  The patient has had 3 cesarean sections.  All pregnancies were complicated by preeclampsia.  She has had a first trimester miscarriage.  She denies any abnormal Pap smears. Her Pap smear in May 2011 was negative.  The patient also underwent bilateral tubal sterilization.  SOCIAL HISTORY:  The patient denies any drinking, smoking, and use of illicit drugs.  FAMILY HISTORY:  Noncontributory.  MEDICATIONS:  Norvasc 10 mg daily and iron  tablets.  ALLERGIES:  No known drug allergies.  The diagnosis was reviewed with the patient who was appropriately saddened by this news.  Appropriate support was given to the patient. She was given copies of her pathology report and also given up-to-date patient education pamphlets about uterine cancer, surgery, and other therapies that are used for this diagnosis.  All her questions were answered and she was told that an appointment has been scheduled for her with Dr. Stanford Breed in the [facility] on July 01, 2010, at 3:15 p.m.  It was stressed about how important this appointment is for further evaluation and management.  The patient denies any other acute symptoms now.  She does say she continues to have small amount of bleeding.  She is understandably overwhelmed by this new diagnosis and she wants everything done.  Of note, the patient did bring up that she does not have any health insurance.  She was told that this is something that will be discussed during her visits at the [facility] and that there could be seven resources available there that can help with the cost of her management and evaluation.  She was told that she would likely meet with the case worker at the [facility].  The patient was told to call or come back in for  any further concerns.  We will wait recommendations by Dr. Loree Fee.          ______________________________ Jaynie Collins, MD    UA/MEDQ  D:  06/27/2010  T:  06/27/2010  Job:  [id number]

## 2010-07-01 ENCOUNTER — Other Ambulatory Visit: Payer: Self-pay | Admitting: Gynecology

## 2010-07-01 ENCOUNTER — Ambulatory Visit: Payer: Self-pay | Attending: Gynecology | Admitting: Gynecology

## 2010-07-01 DIAGNOSIS — N898 Other specified noninflammatory disorders of vagina: Secondary | ICD-10-CM | POA: Insufficient documentation

## 2010-07-01 DIAGNOSIS — C541 Malignant neoplasm of endometrium: Secondary | ICD-10-CM

## 2010-07-01 DIAGNOSIS — C549 Malignant neoplasm of corpus uteri, unspecified: Secondary | ICD-10-CM | POA: Insufficient documentation

## 2010-07-01 DIAGNOSIS — Z79899 Other long term (current) drug therapy: Secondary | ICD-10-CM | POA: Insufficient documentation

## 2010-07-01 DIAGNOSIS — I10 Essential (primary) hypertension: Secondary | ICD-10-CM | POA: Insufficient documentation

## 2010-07-01 DIAGNOSIS — Z9089 Acquired absence of other organs: Secondary | ICD-10-CM | POA: Insufficient documentation

## 2010-07-01 DIAGNOSIS — D259 Leiomyoma of uterus, unspecified: Secondary | ICD-10-CM | POA: Insufficient documentation

## 2010-07-01 LAB — CBC
HCT: 34.7 % — ABNORMAL LOW (ref 36.0–46.0)
Hemoglobin: 10.5 g/dL — ABNORMAL LOW (ref 12.0–15.0)
MCH: 23.8 pg — ABNORMAL LOW (ref 26.0–34.0)
MCHC: 30.3 g/dL (ref 30.0–36.0)
Platelets: 329 10*3/uL (ref 150–400)
WBC: 7.6 10*3/uL (ref 4.0–10.5)

## 2010-07-01 LAB — HCG, QUANTITATIVE, PREGNANCY: hCG, Beta Chain, Quant, S: 1 m[IU]/mL (ref <5)

## 2010-07-02 ENCOUNTER — Ambulatory Visit (HOSPITAL_COMMUNITY)
Admission: RE | Admit: 2010-07-02 | Discharge: 2010-07-02 | Disposition: A | Discharge status: Home / Self Care | Payer: Self-pay | Source: Ambulatory Visit | Attending: Gynecology | Admitting: Gynecology

## 2010-07-02 DIAGNOSIS — C549 Malignant neoplasm of corpus uteri, unspecified: Secondary | ICD-10-CM | POA: Insufficient documentation

## 2010-07-02 DIAGNOSIS — C541 Malignant neoplasm of endometrium: Secondary | ICD-10-CM

## 2010-07-02 DIAGNOSIS — K7689 Other specified diseases of liver: Secondary | ICD-10-CM | POA: Insufficient documentation

## 2010-07-02 DIAGNOSIS — E041 Nontoxic single thyroid nodule: Secondary | ICD-10-CM | POA: Insufficient documentation

## 2010-07-02 DIAGNOSIS — N852 Hypertrophy of uterus: Secondary | ICD-10-CM | POA: Insufficient documentation

## 2010-07-02 MED ORDER — IOHEXOL 300 MG/ML  SOLN
100.0000 mL | Freq: Once | INTRAMUSCULAR | Status: AC | PRN
  Administered 2010-07-02: 100 mL via INTRAVENOUS

## 2010-07-02 NOTE — Consult Note (Signed)
Krystal [name], [name]               ACCOUNT NO.:  [id number]  MEDICAL RECORD NO.:  [id number]  LOCATION:  GYN                          FACILITY:  [facility]  PHYSICIAN:  [name], M.D.DATE OF BIRTH:  1970/07/14  DATE OF CONSULTATION:  07/01/2010 DATE OF DISCHARGE:                                CONSULTATION   CHIEF COMPLAINT:  Endometrial cancer.  HISTORY OF PRESENT ILLNESS:  A 40 year old African female seen in consultation at the request of Dr. 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  The patient had her 3rd child by cesarean section approximately 7 months ago.  Since discontinuing breastfeeding in February, she has had intermittent vaginal bleeding sometimes heavy.  She saw Dr. Jolayne Panther in early June.  At that time, she had a Pap smear and an endometrial biopsy obtained.  The uterus is enlarged and the patient has known uterine fibroids.  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  The patient reports that she has always had normal Pap smears and has had no other gynecologic problems except for known fibroids of the uterus.  The patient been pregnant 3 times and delivered by cesarean section 3 times predominately because of severe preeclampsia.  PAST MEDICAL HISTORY/MEDICAL ILLNESSES:  Hypertension.  PAST SURGICAL HISTORY:  Cesarean section x3 and an appendectomy.  CURRENT MEDICATIONS:  Norvasc and iron.  FAMILY HISTORY:  Negative for gynecologic, breast, or colon cancer.  SOCIAL HISTORY:  The patient immigrated from [place] approximately 12 years ago.  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 Her children are 8, 5, and 92 months old.  DRUG ALLERGIES:  None.  REVIEW OF SYSTEMS:  The patient had a cough, but this is cleared with antibiotics approximately 3 weeks  ago.  She denies any abdominal pain and has moderate vaginal bleeding.  PHYSICAL EXAMINATION:  VITAL SIGNS:  Weight 190 pounds, height 5 feet 6 inches, blood pressure 130/68. GENERAL:  The patient is a healthy African female in no acute distress. HEENT:  The left eye to have what appears to be a cataract.  She has a birthmark on the skin around her right eye.  NECK:  Supple without thyromegaly.  There is no supraclavicular or inguinal adenopathy. ABDOMEN:  Soft and nontender.  She has a well healed Pfannenstiel incision.  There is a palpable mass approximately halfway between the pubis and the umbilicus, which is nontender. PELVIC:  EGBUS, vagina, bladder, urethra are normal except for some blood in the os.  The cervix appears normal.  There are no lesions noted.  On bimanual examination, the uterus is approximately 16 weeks' gestational size, mobile, and nontender.  Rectovaginal exam confirms. There is no parametrial involvement.  IMPRESSION:  Based on the biopsy, the patient has a high-grade endometrial carcinoma or carcinosarcoma.  This is clearly a most unusual finding in a 40 year old who had a infant only 7 months ago.  On the Outside Chance that this represents gestational trophoblastic disease, we will obtain a serum hCG.  We will proceed assuming that this is an endometrial cancer and obtain a  CT scan of the chest, abdomen, and pelvis tomorrow.  We will schedule her for abdominal hysterectomy, bilateral salpingo-oophorectomy, and pelvic and periaortic lymphadenectomy.  My next available operating day is at [facility] on June 26th, next Tuesday.  She will come to [facility] prior to surgery for preoperative evaluation and anesthesia consultation.     [name], M.D.     DC/MEDQ  D:  07/01/2010  T:  07/02/2010  Job:  [id number]  cc:   [name], MD  Telford Nab, R.N. [address] [place], [place] [address]    Electronically Signed by [name]  M.D. on 07/02/2010 09:14:50 AM

## 2010-07-08 HISTORY — PX: ABDOMINAL HYSTERECTOMY: SHX81

## 2010-07-13 DIAGNOSIS — K567 Ileus, unspecified: Secondary | ICD-10-CM

## 2010-07-13 HISTORY — DX: Ileus, unspecified: K56.7

## 2010-07-14 ENCOUNTER — Ambulatory Visit: Payer: Self-pay | Admitting: Obstetrics and Gynecology

## 2010-07-20 ENCOUNTER — Encounter (HOSPITAL_COMMUNITY): Payer: Self-pay | Admitting: *Deleted

## 2010-07-20 ENCOUNTER — Inpatient Hospital Stay (HOSPITAL_COMMUNITY)
Admission: AD | Admit: 2010-07-20 | Discharge: 2010-07-20 | Disposition: A | Discharge status: Home / Self Care | Payer: Self-pay | Source: Ambulatory Visit | Attending: Obstetrics & Gynecology | Admitting: Obstetrics & Gynecology

## 2010-07-20 ENCOUNTER — Inpatient Hospital Stay (HOSPITAL_COMMUNITY): Payer: Self-pay | Admitting: Obstetrics & Gynecology

## 2010-07-20 DIAGNOSIS — T8131XA Disruption of external operation (surgical) wound, not elsewhere classified, initial encounter: Secondary | ICD-10-CM | POA: Insufficient documentation

## 2010-07-20 DIAGNOSIS — Y838 Other surgical procedures as the cause of abnormal reaction of the patient, or of later complication, without mention of misadventure at the time of the procedure: Secondary | ICD-10-CM | POA: Insufficient documentation

## 2010-07-20 HISTORY — DX: Essential (primary) hypertension: I10

## 2010-07-20 HISTORY — DX: Malignant neoplasm of uterus, part unspecified: C55

## 2010-07-20 NOTE — ED Provider Notes (Signed)
History     Chief Complaint  Patient presents with  . Wound Check   HPI   Had surgery for uterine cancer.  Was discharged from [facility] on Thursday. Was cleaning incision today and noticed that it was open further on lower part of incision today.  No malodorous discharge.  No fever. Came to be checked.  Has an appointment for chemotherapy this week.  Past Medical History  Diagnosis Date  . Uterine cancer   . Hypertension   . Asthma     Past Surgical History  Procedure Date  . Cesarean section   . Abdominal hysterectomy   . Salpingoophorectomy 07/08/2010    No family history on file.  History  Substance Use Topics  . Smoking status: Never Smoker   . Smokeless tobacco: Not on file  . Alcohol Use: No    Allergies: No Known Allergies  Prescriptions prior to admission  Medication Sig Dispense Refill  . amLODipine (NORVASC) 10 MG tablet Take 10 mg by mouth daily.        Marland Kitchen docusate sodium (COLACE) 100 MG capsule Take 100 mg by mouth 2 (two) times daily as needed. bm       . glycerin adult (GLYCERIN ADULT) 2 G SUPP Place 1 suppository rectally once as needed. bm       . ibuprofen (ADVIL,MOTRIN) 800 MG tablet Take 800 mg by mouth every 8 (eight) hours as needed. pain       . oxyCODONE-acetaminophen (PERCOCET) 5-325 MG per tablet Take 1 tablet by mouth every 4 (four) hours as needed. pain         ROS Physical Exam   Blood pressure 120/79, pulse 81, temperature 98.4 F (36.9 C), temperature source Oral, resp. rate 16, last menstrual period 07/02/2010.  Physical Exam Client is stable, vital signs normal, abdomen clean and dry.  Vertical incision from above umbilicus to pubic symphysis.  1/2 of upper incision well closed.  Middle of incision shows skin layer only open approx 2mm.  Bottom of incision skin layer is open 12-61mm. Fatty layer completely closed. No active drainage.  No inflammation at incision edges.  nontender to palpation at entire length of incision.   No opening through the incision.  No signs of infection noted. MAU Course  Procedures  MDM Incision opened slightly but healing well.  Plan: Client advised to continue to cleanse area and cover as she has been doing.  Advised to leave area open as it is and do not try to approximate edges.  Advised area will heal from inside to outside.  Advised to keep appointments as scheduled and to have provider look at incision again at her appointment.  Reviewed signs of infection and advised to return with pain, redness, abnormal drainage, or fever.   Nolene Bernheim, NP 07/20/10 1425

## 2010-07-20 NOTE — Initial Assessments (Signed)
Pt had hysterectomy and bilateral salpingo-oophorectomy on 07/08/10.  Went home on Thursday.  Was told would have home health, but no one has come yet.  Lower incision began to open yesterday.  No discharge, redness, or odor.

## 2010-07-20 NOTE — Progress Notes (Signed)
Had surgery on 07/08/10 for uterine cancer, feels like incision opening

## 2010-07-22 ENCOUNTER — Encounter (HOSPITAL_BASED_OUTPATIENT_CLINIC_OR_DEPARTMENT_OTHER): Payer: Self-pay | Admitting: Oncology

## 2010-07-22 ENCOUNTER — Other Ambulatory Visit: Payer: Self-pay | Admitting: Oncology

## 2010-07-22 DIAGNOSIS — C549 Malignant neoplasm of corpus uteri, unspecified: Secondary | ICD-10-CM

## 2010-07-22 LAB — CBC WITH DIFFERENTIAL/PLATELET
BASO%: 0 % (ref 0.0–2.0)
Basophils Absolute: 0 10*3/uL (ref 0.0–0.1)
EOS%: 5.6 % (ref 0.0–7.0)
HGB: 10.9 g/dL — ABNORMAL LOW (ref 11.6–15.9)
MCH: 25.5 pg (ref 25.1–34.0)
MCHC: 32.4 g/dL (ref 31.5–36.0)
MCV: 78.6 fL — ABNORMAL LOW (ref 79.5–101.0)
MONO%: 3.5 % (ref 0.0–14.0)
RBC: 4.28 10*6/uL (ref 3.70–5.45)
RDW: 18.2 % — ABNORMAL HIGH (ref 11.2–14.5)
lymph#: 1.8 10*3/uL (ref 0.9–3.3)

## 2010-07-22 LAB — COMPREHENSIVE METABOLIC PANEL
ALT: 35 U/L (ref 0–35)
AST: 14 U/L (ref 0–37)
Albumin: 3.9 g/dL (ref 3.5–5.2)
Alkaline Phosphatase: 102 U/L (ref 39–117)
BUN: 12 mg/dL (ref 6–23)
Creatinine, Ser: 0.78 mg/dL (ref 0.50–1.10)
Potassium: 4.5 mEq/L (ref 3.5–5.3)

## 2010-07-22 LAB — IRON AND TIBC
%SAT: 7 % — ABNORMAL LOW (ref 20–55)
TIBC: 369 ug/dL (ref 250–470)

## 2010-08-01 ENCOUNTER — Encounter (HOSPITAL_BASED_OUTPATIENT_CLINIC_OR_DEPARTMENT_OTHER): Payer: Self-pay | Admitting: Oncology

## 2010-08-01 DIAGNOSIS — Z5111 Encounter for antineoplastic chemotherapy: Secondary | ICD-10-CM

## 2010-08-01 DIAGNOSIS — C779 Secondary and unspecified malignant neoplasm of lymph node, unspecified: Secondary | ICD-10-CM

## 2010-08-01 DIAGNOSIS — C7960 Secondary malignant neoplasm of unspecified ovary: Secondary | ICD-10-CM

## 2010-08-01 DIAGNOSIS — C549 Malignant neoplasm of corpus uteri, unspecified: Secondary | ICD-10-CM

## 2010-08-08 ENCOUNTER — Other Ambulatory Visit: Payer: Self-pay | Admitting: Oncology

## 2010-08-08 ENCOUNTER — Encounter (HOSPITAL_BASED_OUTPATIENT_CLINIC_OR_DEPARTMENT_OTHER): Payer: Self-pay | Admitting: Oncology

## 2010-08-08 DIAGNOSIS — C549 Malignant neoplasm of corpus uteri, unspecified: Secondary | ICD-10-CM

## 2010-08-08 LAB — CBC WITH DIFFERENTIAL/PLATELET
Basophils Absolute: 0 10*3/uL (ref 0.0–0.1)
Eosinophils Absolute: 0.4 10*3/uL (ref 0.0–0.5)
HCT: 32.9 % — ABNORMAL LOW (ref 34.8–46.6)
HGB: 10.2 g/dL — ABNORMAL LOW (ref 11.6–15.9)
NEUT#: 3 10*3/uL (ref 1.5–6.5)
RDW: 16.9 % — ABNORMAL HIGH (ref 11.2–14.5)
lymph#: 1.7 10*3/uL (ref 0.9–3.3)

## 2010-08-11 ENCOUNTER — Ambulatory Visit
Admission: RE | Admit: 2010-08-11 | Discharge: 2010-08-11 | Disposition: A | Discharge status: Home / Self Care | Payer: Self-pay | Source: Ambulatory Visit | Attending: Radiation Oncology | Admitting: Radiation Oncology

## 2010-08-11 DIAGNOSIS — Z510 Encounter for antineoplastic radiation therapy: Secondary | ICD-10-CM | POA: Insufficient documentation

## 2010-08-11 DIAGNOSIS — R110 Nausea: Secondary | ICD-10-CM | POA: Insufficient documentation

## 2010-08-11 DIAGNOSIS — C549 Malignant neoplasm of corpus uteri, unspecified: Secondary | ICD-10-CM | POA: Insufficient documentation

## 2010-08-13 ENCOUNTER — Other Ambulatory Visit: Payer: Self-pay | Admitting: Oncology

## 2010-08-13 ENCOUNTER — Encounter: Payer: Self-pay | Admitting: Oncology

## 2010-08-15 ENCOUNTER — Other Ambulatory Visit: Payer: Self-pay | Admitting: Oncology

## 2010-08-15 ENCOUNTER — Encounter (HOSPITAL_BASED_OUTPATIENT_CLINIC_OR_DEPARTMENT_OTHER): Payer: Self-pay | Admitting: Oncology

## 2010-08-15 DIAGNOSIS — C549 Malignant neoplasm of corpus uteri, unspecified: Secondary | ICD-10-CM

## 2010-08-15 DIAGNOSIS — D509 Iron deficiency anemia, unspecified: Secondary | ICD-10-CM

## 2010-08-15 DIAGNOSIS — D702 Other drug-induced agranulocytosis: Secondary | ICD-10-CM

## 2010-08-15 LAB — CBC WITH DIFFERENTIAL/PLATELET
BASO%: 0.4 % (ref 0.0–2.0)
Basophils Absolute: 0 10*3/uL (ref 0.0–0.1)
Eosinophils Absolute: 0 10*3/uL (ref 0.0–0.5)
HCT: 32.6 % — ABNORMAL LOW (ref 34.8–46.6)
HGB: 10.5 g/dL — ABNORMAL LOW (ref 11.6–15.9)
MONO#: 0.3 10*3/uL (ref 0.1–0.9)
NEUT#: 0.7 10*3/uL — ABNORMAL LOW (ref 1.5–6.5)
NEUT%: 25.4 % — ABNORMAL LOW (ref 38.4–76.8)
WBC: 2.9 10*3/uL — ABNORMAL LOW (ref 3.9–10.3)
lymph#: 1.8 10*3/uL (ref 0.9–3.3)

## 2010-08-15 LAB — COMPREHENSIVE METABOLIC PANEL
ALT: 22 U/L (ref 0–35)
BUN: 13 mg/dL (ref 6–23)
CO2: 23 mEq/L (ref 19–32)
Calcium: 9.4 mg/dL (ref 8.4–10.5)
Chloride: 104 mEq/L (ref 96–112)
Creatinine, Ser: 0.79 mg/dL (ref 0.50–1.10)

## 2010-08-15 LAB — URINE CULTURE

## 2010-08-16 ENCOUNTER — Encounter (HOSPITAL_BASED_OUTPATIENT_CLINIC_OR_DEPARTMENT_OTHER): Payer: Self-pay | Admitting: Oncology

## 2010-08-16 DIAGNOSIS — D702 Other drug-induced agranulocytosis: Secondary | ICD-10-CM

## 2010-08-16 DIAGNOSIS — C549 Malignant neoplasm of corpus uteri, unspecified: Secondary | ICD-10-CM

## 2010-08-18 ENCOUNTER — Other Ambulatory Visit: Payer: Self-pay | Admitting: Oncology

## 2010-08-18 ENCOUNTER — Encounter (HOSPITAL_BASED_OUTPATIENT_CLINIC_OR_DEPARTMENT_OTHER): Payer: Self-pay | Admitting: Oncology

## 2010-08-18 DIAGNOSIS — C549 Malignant neoplasm of corpus uteri, unspecified: Secondary | ICD-10-CM

## 2010-08-18 LAB — CBC WITH DIFFERENTIAL/PLATELET
BASO%: 0.8 % (ref 0.0–2.0)
HCT: 35 % (ref 34.8–46.6)
MCHC: 30.9 g/dL — ABNORMAL LOW (ref 31.5–36.0)
MONO#: 1.1 10*3/uL — ABNORMAL HIGH (ref 0.1–0.9)
NEUT%: 76.9 % — ABNORMAL HIGH (ref 38.4–76.8)
RBC: 4.45 10*6/uL (ref 3.70–5.45)
WBC: 19.1 10*3/uL — ABNORMAL HIGH (ref 3.9–10.3)
lymph#: 3 10*3/uL (ref 0.9–3.3)
nRBC: 0 % (ref 0–0)

## 2010-08-22 ENCOUNTER — Other Ambulatory Visit: Payer: Self-pay | Admitting: Oncology

## 2010-08-22 ENCOUNTER — Encounter (HOSPITAL_BASED_OUTPATIENT_CLINIC_OR_DEPARTMENT_OTHER): Payer: Self-pay | Admitting: Oncology

## 2010-08-22 DIAGNOSIS — Z5111 Encounter for antineoplastic chemotherapy: Secondary | ICD-10-CM

## 2010-08-22 DIAGNOSIS — C549 Malignant neoplasm of corpus uteri, unspecified: Secondary | ICD-10-CM

## 2010-08-22 LAB — CBC WITH DIFFERENTIAL/PLATELET
Basophils Absolute: 0 10*3/uL (ref 0.0–0.1)
Eosinophils Absolute: 0 10*3/uL (ref 0.0–0.5)
HGB: 11.2 g/dL — ABNORMAL LOW (ref 11.6–15.9)
MONO#: 0.1 10*3/uL (ref 0.1–0.9)
NEUT#: 5.2 10*3/uL (ref 1.5–6.5)
RDW: 17.2 % — ABNORMAL HIGH (ref 11.2–14.5)
lymph#: 1.2 10*3/uL (ref 0.9–3.3)

## 2010-08-29 ENCOUNTER — Other Ambulatory Visit: Payer: Self-pay | Admitting: Oncology

## 2010-08-29 ENCOUNTER — Encounter (HOSPITAL_BASED_OUTPATIENT_CLINIC_OR_DEPARTMENT_OTHER): Payer: Self-pay | Admitting: Oncology

## 2010-08-29 DIAGNOSIS — D702 Other drug-induced agranulocytosis: Secondary | ICD-10-CM

## 2010-08-29 DIAGNOSIS — C549 Malignant neoplasm of corpus uteri, unspecified: Secondary | ICD-10-CM

## 2010-08-29 DIAGNOSIS — C55 Malignant neoplasm of uterus, part unspecified: Secondary | ICD-10-CM

## 2010-08-30 ENCOUNTER — Encounter (HOSPITAL_BASED_OUTPATIENT_CLINIC_OR_DEPARTMENT_OTHER): Payer: Self-pay | Admitting: Oncology

## 2010-08-30 DIAGNOSIS — C549 Malignant neoplasm of corpus uteri, unspecified: Secondary | ICD-10-CM

## 2010-08-30 DIAGNOSIS — D721 Eosinophilia, unspecified: Secondary | ICD-10-CM

## 2010-08-31 ENCOUNTER — Encounter (HOSPITAL_BASED_OUTPATIENT_CLINIC_OR_DEPARTMENT_OTHER): Payer: Self-pay | Admitting: Oncology

## 2010-08-31 DIAGNOSIS — D702 Other drug-induced agranulocytosis: Secondary | ICD-10-CM

## 2010-08-31 DIAGNOSIS — C549 Malignant neoplasm of corpus uteri, unspecified: Secondary | ICD-10-CM

## 2010-09-01 ENCOUNTER — Encounter (HOSPITAL_BASED_OUTPATIENT_CLINIC_OR_DEPARTMENT_OTHER): Payer: Self-pay | Admitting: Oncology

## 2010-09-01 ENCOUNTER — Other Ambulatory Visit: Payer: Self-pay | Admitting: Oncology

## 2010-09-01 DIAGNOSIS — C549 Malignant neoplasm of corpus uteri, unspecified: Secondary | ICD-10-CM

## 2010-09-01 LAB — URINALYSIS, MICROSCOPIC - CHCC
Bilirubin (Urine): NEGATIVE
Glucose: NEGATIVE g/dL
Specific Gravity, Urine: 1.02 (ref 1.003–1.035)

## 2010-09-03 LAB — URINE CULTURE

## 2010-09-08 ENCOUNTER — Other Ambulatory Visit: Payer: Self-pay | Admitting: Oncology

## 2010-09-08 ENCOUNTER — Encounter (HOSPITAL_BASED_OUTPATIENT_CLINIC_OR_DEPARTMENT_OTHER): Payer: Self-pay | Admitting: Oncology

## 2010-09-08 DIAGNOSIS — D509 Iron deficiency anemia, unspecified: Secondary | ICD-10-CM

## 2010-09-08 DIAGNOSIS — C779 Secondary and unspecified malignant neoplasm of lymph node, unspecified: Secondary | ICD-10-CM

## 2010-09-08 DIAGNOSIS — C7960 Secondary malignant neoplasm of unspecified ovary: Secondary | ICD-10-CM

## 2010-09-08 DIAGNOSIS — C549 Malignant neoplasm of corpus uteri, unspecified: Secondary | ICD-10-CM

## 2010-09-08 LAB — COMPREHENSIVE METABOLIC PANEL
AST: 17 U/L (ref 0–37)
Albumin: 4.6 g/dL (ref 3.5–5.2)
BUN: 15 mg/dL (ref 6–23)
Calcium: 9.7 mg/dL (ref 8.4–10.5)
Chloride: 106 mEq/L (ref 96–112)
Potassium: 3.8 mEq/L (ref 3.5–5.3)
Sodium: 142 mEq/L (ref 135–145)
Total Protein: 7.9 g/dL (ref 6.0–8.3)

## 2010-09-08 LAB — CBC WITH DIFFERENTIAL/PLATELET
Basophils Absolute: 0 10*3/uL (ref 0.0–0.1)
EOS%: 1 % (ref 0.0–7.0)
Eosinophils Absolute: 0 10*3/uL (ref 0.0–0.5)
HGB: 10.6 g/dL — ABNORMAL LOW (ref 11.6–15.9)
MCH: 25 pg — ABNORMAL LOW (ref 25.1–34.0)
NEUT#: 2 10*3/uL (ref 1.5–6.5)
RBC: 4.22 10*6/uL (ref 3.70–5.45)
RDW: 21 % — ABNORMAL HIGH (ref 11.2–14.5)
lymph#: 1.6 10*3/uL (ref 0.9–3.3)

## 2010-09-09 ENCOUNTER — Other Ambulatory Visit (HOSPITAL_COMMUNITY): Payer: Self-pay

## 2010-09-09 ENCOUNTER — Ambulatory Visit (HOSPITAL_COMMUNITY)
Admission: RE | Admit: 2010-09-09 | Discharge: 2010-09-09 | Disposition: A | Discharge status: Home / Self Care | Payer: Self-pay | Source: Ambulatory Visit | Attending: Oncology | Admitting: Oncology

## 2010-09-09 ENCOUNTER — Other Ambulatory Visit: Payer: Self-pay | Admitting: Oncology

## 2010-09-09 DIAGNOSIS — I10 Essential (primary) hypertension: Secondary | ICD-10-CM | POA: Insufficient documentation

## 2010-09-09 DIAGNOSIS — C55 Malignant neoplasm of uterus, part unspecified: Secondary | ICD-10-CM

## 2010-09-12 ENCOUNTER — Other Ambulatory Visit: Payer: Self-pay | Admitting: Oncology

## 2010-09-12 ENCOUNTER — Encounter (HOSPITAL_BASED_OUTPATIENT_CLINIC_OR_DEPARTMENT_OTHER): Payer: Self-pay | Admitting: Oncology

## 2010-09-12 DIAGNOSIS — Z5111 Encounter for antineoplastic chemotherapy: Secondary | ICD-10-CM

## 2010-09-12 DIAGNOSIS — C549 Malignant neoplasm of corpus uteri, unspecified: Secondary | ICD-10-CM

## 2010-09-12 LAB — CBC WITH DIFFERENTIAL/PLATELET
BASO%: 0.2 % (ref 0.0–2.0)
EOS%: 0 % (ref 0.0–7.0)
HGB: 10.8 g/dL — ABNORMAL LOW (ref 11.6–15.9)
MCH: 24.3 pg — ABNORMAL LOW (ref 25.1–34.0)
MCHC: 31.3 g/dL — ABNORMAL LOW (ref 31.5–36.0)
MCV: 77.5 fL — ABNORMAL LOW (ref 79.5–101.0)
MONO%: 0.5 % (ref 0.0–14.0)
RBC: 4.45 10*6/uL (ref 3.70–5.45)
RDW: 19 % — ABNORMAL HIGH (ref 11.2–14.5)
lymph#: 0.6 10*3/uL — ABNORMAL LOW (ref 0.9–3.3)

## 2010-09-19 ENCOUNTER — Encounter (HOSPITAL_BASED_OUTPATIENT_CLINIC_OR_DEPARTMENT_OTHER): Payer: Self-pay | Admitting: Oncology

## 2010-09-19 DIAGNOSIS — C549 Malignant neoplasm of corpus uteri, unspecified: Secondary | ICD-10-CM

## 2010-09-19 DIAGNOSIS — D702 Other drug-induced agranulocytosis: Secondary | ICD-10-CM

## 2010-09-20 ENCOUNTER — Encounter: Payer: Self-pay | Admitting: Oncology

## 2010-09-21 ENCOUNTER — Encounter: Payer: Self-pay | Admitting: Oncology

## 2010-09-30 ENCOUNTER — Encounter (HOSPITAL_BASED_OUTPATIENT_CLINIC_OR_DEPARTMENT_OTHER): Payer: Self-pay | Admitting: Oncology

## 2010-09-30 ENCOUNTER — Other Ambulatory Visit: Payer: Self-pay | Admitting: Oncology

## 2010-09-30 DIAGNOSIS — C549 Malignant neoplasm of corpus uteri, unspecified: Secondary | ICD-10-CM

## 2010-09-30 DIAGNOSIS — D509 Iron deficiency anemia, unspecified: Secondary | ICD-10-CM

## 2010-09-30 DIAGNOSIS — T451X5A Adverse effect of antineoplastic and immunosuppressive drugs, initial encounter: Secondary | ICD-10-CM

## 2010-09-30 LAB — CBC WITH DIFFERENTIAL/PLATELET
Basophils Absolute: 0 10*3/uL (ref 0.0–0.1)
Eosinophils Absolute: 0 10*3/uL (ref 0.0–0.5)
HGB: 9.8 g/dL — ABNORMAL LOW (ref 11.6–15.9)
MONO%: 6.8 % (ref 0.0–14.0)
NEUT#: 2.6 10*3/uL (ref 1.5–6.5)
RBC: 4.01 10*6/uL (ref 3.70–5.45)
RDW: 19.9 % — ABNORMAL HIGH (ref 11.2–14.5)
WBC: 4.6 10*3/uL (ref 3.9–10.3)
lymph#: 1.6 10*3/uL (ref 0.9–3.3)

## 2010-10-10 ENCOUNTER — Encounter (HOSPITAL_BASED_OUTPATIENT_CLINIC_OR_DEPARTMENT_OTHER): Payer: Self-pay | Admitting: Oncology

## 2010-10-10 DIAGNOSIS — Z23 Encounter for immunization: Secondary | ICD-10-CM

## 2010-10-17 LAB — URINALYSIS, ROUTINE W REFLEX MICROSCOPIC
Glucose, UA: NEGATIVE mg/dL
Hgb urine dipstick: NEGATIVE
Protein, ur: NEGATIVE mg/dL
Specific Gravity, Urine: 1.024 (ref 1.005–1.030)
Urobilinogen, UA: 1 mg/dL (ref 0.0–1.0)

## 2010-10-17 LAB — POCT PREGNANCY, URINE: Preg Test, Ur: NEGATIVE

## 2010-10-21 ENCOUNTER — Other Ambulatory Visit: Payer: Self-pay | Admitting: Oncology

## 2010-10-21 ENCOUNTER — Encounter (HOSPITAL_BASED_OUTPATIENT_CLINIC_OR_DEPARTMENT_OTHER): Payer: Self-pay | Admitting: Oncology

## 2010-10-21 DIAGNOSIS — C7960 Secondary malignant neoplasm of unspecified ovary: Secondary | ICD-10-CM

## 2010-10-21 DIAGNOSIS — C549 Malignant neoplasm of corpus uteri, unspecified: Secondary | ICD-10-CM

## 2010-10-21 DIAGNOSIS — C779 Secondary and unspecified malignant neoplasm of lymph node, unspecified: Secondary | ICD-10-CM

## 2010-10-21 DIAGNOSIS — D509 Iron deficiency anemia, unspecified: Secondary | ICD-10-CM

## 2010-10-21 LAB — CBC WITH DIFFERENTIAL/PLATELET
Basophils Absolute: 0 10*3/uL (ref 0.0–0.1)
EOS%: 2.1 % (ref 0.0–7.0)
Eosinophils Absolute: 0.1 10*3/uL (ref 0.0–0.5)
HCT: 31.9 % — ABNORMAL LOW (ref 34.8–46.6)
HGB: 10 g/dL — ABNORMAL LOW (ref 11.6–15.9)
MCH: 25.1 pg (ref 25.1–34.0)
MCV: 79.9 fL (ref 79.5–101.0)
MONO%: 7 % (ref 0.0–14.0)
NEUT#: 2.1 10*3/uL (ref 1.5–6.5)
NEUT%: 72.4 % (ref 38.4–76.8)
RDW: 19.6 % — ABNORMAL HIGH (ref 11.2–14.5)

## 2010-10-21 LAB — URINALYSIS, MICROSCOPIC - CHCC
Bilirubin (Urine): NEGATIVE
Glucose: NEGATIVE g/dL
Ketones: NEGATIVE mg/dL
Leukocyte Esterase: NEGATIVE
Nitrite: NEGATIVE
Protein: NEGATIVE mg/dL
Specific Gravity, Urine: 1.01 (ref 1.003–1.035)
WBC, UA: NEGATIVE (ref 0–2)
pH: 6.5 (ref 4.6–8.0)

## 2010-10-23 LAB — URINE CULTURE

## 2010-10-27 ENCOUNTER — Encounter (HOSPITAL_BASED_OUTPATIENT_CLINIC_OR_DEPARTMENT_OTHER): Payer: Self-pay | Admitting: Oncology

## 2010-10-27 ENCOUNTER — Other Ambulatory Visit: Payer: Self-pay | Admitting: Oncology

## 2010-10-27 DIAGNOSIS — C779 Secondary and unspecified malignant neoplasm of lymph node, unspecified: Secondary | ICD-10-CM

## 2010-10-27 DIAGNOSIS — C549 Malignant neoplasm of corpus uteri, unspecified: Secondary | ICD-10-CM

## 2010-10-27 DIAGNOSIS — D509 Iron deficiency anemia, unspecified: Secondary | ICD-10-CM

## 2010-10-27 DIAGNOSIS — C7960 Secondary malignant neoplasm of unspecified ovary: Secondary | ICD-10-CM

## 2010-10-27 DIAGNOSIS — Z452 Encounter for adjustment and management of vascular access device: Secondary | ICD-10-CM

## 2010-10-27 LAB — COMPREHENSIVE METABOLIC PANEL
AST: 32 U/L (ref 0–37)
Albumin: 3.9 g/dL (ref 3.5–5.2)
BUN: 12 mg/dL (ref 6–23)
CO2: 26 mEq/L (ref 19–32)
Calcium: 9.5 mg/dL (ref 8.4–10.5)
Chloride: 103 mEq/L (ref 96–112)
Creatinine, Ser: 0.63 mg/dL (ref 0.50–1.10)
Glucose, Bld: 99 mg/dL (ref 70–99)
Potassium: 3.3 mEq/L — ABNORMAL LOW (ref 3.5–5.3)

## 2010-10-27 LAB — CBC WITH DIFFERENTIAL/PLATELET
Basophils Absolute: 0 10*3/uL (ref 0.0–0.1)
Eosinophils Absolute: 0.1 10*3/uL (ref 0.0–0.5)
HCT: 30.8 % — ABNORMAL LOW (ref 34.8–46.6)
HGB: 10 g/dL — ABNORMAL LOW (ref 11.6–15.9)
MONO#: 0.3 10*3/uL (ref 0.1–0.9)
NEUT#: 1.7 10*3/uL (ref 1.5–6.5)
NEUT%: 74.3 % (ref 38.4–76.8)
RDW: 21.3 % — ABNORMAL HIGH (ref 11.2–14.5)
lymph#: 0.2 10*3/uL — ABNORMAL LOW (ref 0.9–3.3)

## 2010-11-10 ENCOUNTER — Ambulatory Visit
Admission: RE | Admit: 2010-11-10 | Discharge: 2010-11-10 | Disposition: A | Discharge status: Home / Self Care | Payer: Self-pay | Source: Ambulatory Visit | Attending: Radiation Oncology | Admitting: Radiation Oncology

## 2010-11-10 DIAGNOSIS — C549 Malignant neoplasm of corpus uteri, unspecified: Secondary | ICD-10-CM | POA: Insufficient documentation

## 2010-11-12 ENCOUNTER — Ambulatory Visit: Payer: Self-pay | Attending: Gynecology | Admitting: Gynecology

## 2010-11-12 DIAGNOSIS — Z9221 Personal history of antineoplastic chemotherapy: Secondary | ICD-10-CM | POA: Insufficient documentation

## 2010-11-12 DIAGNOSIS — Z9079 Acquired absence of other genital organ(s): Secondary | ICD-10-CM | POA: Insufficient documentation

## 2010-11-12 DIAGNOSIS — I10 Essential (primary) hypertension: Secondary | ICD-10-CM | POA: Insufficient documentation

## 2010-11-12 DIAGNOSIS — C549 Malignant neoplasm of corpus uteri, unspecified: Secondary | ICD-10-CM | POA: Insufficient documentation

## 2010-11-12 DIAGNOSIS — Z90710 Acquired absence of both cervix and uterus: Secondary | ICD-10-CM | POA: Insufficient documentation

## 2010-11-14 NOTE — Consult Note (Signed)
Krystal [name], Krystal [name]               ACCOUNT NO.:  [id number]  MEDICAL RECORD NO.:  [id number]  LOCATION:  GYN                          FACILITY:  [facility]  PHYSICIAN:  [name], M.D.DATE OF BIRTH:  1970-12-16  DATE OF CONSULTATION:  11/12/2010 DATE OF DISCHARGE:                                CONSULTATION   CHIEF COMPLAINT:  Papillary serous endometrial carcinoma.  INTERVAL HISTORY:  The patient returns today for continuing followup and evaluation.  She has now received 3 cycles of carboplatin and Taxol, and subsequently extended field radiation therapy.  She is about ready to enter the second phase of her chemotherapy with plans additional 3 cycles of carboplatin and Taxol.  In addition, we still have plans to provide upper vaginal brachytherapy.  Overall, she seems to be tolerating the therapy well with her main complaint being that of nausea.  She denies any vomiting.  She apparently is eating well and actually her weight has increased.  She has no pelvic pain, pressure, vaginal bleeding, or discharge.  HISTORY OF PRESENT ILLNESS:  The patient underwent a total abdominal hysterectomy, bilateral salpingo-oophorectomy, pelvic and periaortic lymphadenectomy, and omentectomy on July 08, 2010.  She was found to have a papillary serous carcinoma.  Which was poorly differentiated with deep myometrial involvement, involvement of the cervix and adnexal as well as 9 of 34 pelvic and periaortic lymph nodes.  Sandwich chemotherapy and radiation therapy was advised, and the patient is currently mid way through that at planned regimen.  PAST MEDICAL HISTORY:  Medical illnesses, hypertension.  PAST SURGICAL HISTORY:  Cesarean section x3, appendectomy, TAH-BSO, and pelvic and periaortic lymphadenectomy.  CURRENT MEDICATIONS:  Norvasc and iron.  FAMILY HISTORY:  Negative for gynecologic, breast, or colon cancer.  SOCIAL HISTORY:  The patient emigrated from [place] approximately 12 years ago.  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 She has children that her 70, 5, and 3 months old.  DRUG ALLERGIES:  None.  REVIEW OF SYSTEMS:  Ten-point comprehensive review of systems is negative except as noted above.  PHYSICAL EXAMINATION:  VITAL SIGNS:  Weight 200 pounds, blood pressure 130/64, pulse 84, and respiratory rate 20. GENERAL:  Patient is pleasant African female, in no acute distress. HEENT:  Negative. NECK:  Supple without thyromegaly.  There is no supraclavicular or inguinal adenopathy. ABDOMEN:  Soft and nontender.  No masses, organomegaly, ascites, or hernias noted.  Midline incision is well healed. PELVIC EXAM:  EGBUS, vagina, bladder, and urethra are normal.  Cervix and uterus are surgically absent.  The vaginal cuff is well healed.  No lesions are noted.  Bimanual rectovaginal exam reveal no masses, induration, nodularity.  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 The patient is receiving adjuvant chemotherapy and radiation therapy.  I will plan on seeing the patient back for continued followup and surveillance, shortly after completing our current planned therapy.     [name], M.D.     DC/MEDQ  D:  11/12/2010  T:  11/13/2010  Job:  [id number]  cc:   [name], MD  Telford Nab, R.N. [address] [place], [place] [address]  Billie Lade,  Ph.D., M.D. Fax: [phone]  [name], M.D. Fax: [phone]  Electronically Signed by [name] M.D. on 11/14/2010 09:44:15 AM

## 2010-11-24 ENCOUNTER — Ambulatory Visit (HOSPITAL_BASED_OUTPATIENT_CLINIC_OR_DEPARTMENT_OTHER): Payer: Self-pay | Admitting: Oncology

## 2010-11-24 ENCOUNTER — Telehealth: Payer: Self-pay | Admitting: Oncology

## 2010-11-24 VITALS — BP 133/81 | HR 91 | Temp 97.3°F | Ht 66.0 in | Wt 201.9 lb

## 2010-11-24 DIAGNOSIS — C549 Malignant neoplasm of corpus uteri, unspecified: Secondary | ICD-10-CM

## 2010-11-24 DIAGNOSIS — R5383 Other fatigue: Secondary | ICD-10-CM

## 2010-11-24 DIAGNOSIS — C541 Malignant neoplasm of endometrium: Secondary | ICD-10-CM | POA: Insufficient documentation

## 2010-11-24 NOTE — Telephone Encounter (Signed)
gv pt appt schedule for nov/dec. °

## 2010-11-25 ENCOUNTER — Encounter: Payer: Self-pay | Admitting: *Deleted

## 2010-11-25 ENCOUNTER — Telehealth: Payer: Self-pay | Admitting: *Deleted

## 2010-11-25 DIAGNOSIS — O1490 Unspecified pre-eclampsia, unspecified trimester: Secondary | ICD-10-CM | POA: Insufficient documentation

## 2010-11-25 DIAGNOSIS — Z8759 Personal history of other complications of pregnancy, childbirth and the puerperium: Secondary | ICD-10-CM | POA: Insufficient documentation

## 2010-11-26 ENCOUNTER — Ambulatory Visit
Admission: RE | Admit: 2010-11-26 | Discharge: 2010-11-26 | Disposition: A | Discharge status: Home / Self Care | Payer: Self-pay | Source: Ambulatory Visit | Attending: Radiation Oncology | Admitting: Radiation Oncology

## 2010-11-26 ENCOUNTER — Encounter: Payer: Self-pay | Admitting: Radiation Oncology

## 2010-11-26 DIAGNOSIS — C549 Malignant neoplasm of corpus uteri, unspecified: Secondary | ICD-10-CM | POA: Insufficient documentation

## 2010-11-26 DIAGNOSIS — Z510 Encounter for antineoplastic radiation therapy: Secondary | ICD-10-CM | POA: Insufficient documentation

## 2010-11-26 NOTE — Procedures (Signed)
DIAGNOSIS:  Endometrial cancer.  NARRATIVE:  Earlier today, Krystal [name] underwent fitting for her vaginal brachytherapy treatments.  The optimal ring and diameter placement for the patient's anatomy was three 2.5 cm rings placed proximally within the vaginal vault.  More distally, two 2 cm rings were placed.  This was the arrangement that optimally dilated the vaginal vault without undue discomfort for the patient.  This vaginal cylinder arrangement will be used for her 3 high-dose treatments.    ______________________________ Billie Lade, Ph.D., M.D. JDK/MEDQ  D:  11/26/2010  T:  11/26/2010  Job:  [id number]

## 2010-11-26 NOTE — Progress Notes (Signed)
Encounter addended by: Lowella Petties, RN on: 11/26/2010  1:56 PM<BR>     Documentation filed: Charges VN

## 2010-11-26 NOTE — Progress Notes (Signed)
Encounter addended by: Lowella Petties, RN on: 11/26/2010  1:55 PM<BR>     Documentation filed: Visit Diagnoses, Notes Section

## 2010-11-26 NOTE — Procedures (Signed)
DIAGNOSIS:  Endometrial cancer.  NARRATIVE:  Earlier today, Krystal [name] underwent film verification of her vaginal cylinder in preparation for her first high-dose rate treatment. The patient had a fiducial marker placed within the vaginal cylinder. An AP and lateral film was obtained in the treatment position verifying accurate placement of the vaginal cylinder for treatment.    ______________________________ Billie Lade, Ph.D., M.D. JDK/MEDQ  D:  11/26/2010  T:  11/26/2010  Job:  [id number]

## 2010-11-26 NOTE — Progress Notes (Signed)
Pt foley d/c'd after ct sim earlier, pt left and came back after planning made, pt had 1st HDR rad tx, tolerated well and d/c home after,eill return next week for 2nd HDR tx, cylinder numbers#[id number]

## 2010-11-26 NOTE — Progress Notes (Signed)
DIAGNOSIS:  Endometrial cancer.  NARRATIVE:  Earlier today Ms. Garr underwent planning for her 1st high- dose rate treatment.  The patient was placed on the CT simulator table. The patient's previously constructed vaginal cylinder was placed in the proximal vagina.  The vaginal cylinder also had a fiducial marker placed for imaging purposes.  The vaginal cylinder was affixed to the CT/MR stabilization plate to prevent slippage.  The patient's Foley catheter was accessed and contrast was placed in the bladder.  The patient also had a rectal tube placed with contrast instilled into the rectum.  The patient then proceeded to undergo CT scan in the treatment position. Under virtual simulation, the patient's vaginal cylinder appeared to be in excellent position in the proximal vagina.  The patient had contouring of the bladder, rectum and sigmoid colon as well as the vaginal cylinder.  PLAN:  The patient will proceed with planning for her 1st high-dose rate treatment.  The patient will be prescribed a dose of 600 cGy to be delivered to the proximal vaginal mucosal surface.  Treatment length will be 3 cm.  The reading 192 will be the high-dose rate source.  The patient will receive 3 weekly treatments for a cumulative brachytherapy dose of 1800 cGy.    ______________________________ Billie Lade, Ph.D., M.D. JDK/MEDQ  D:  11/26/2010  T:  11/26/2010  Job:  [id number]

## 2010-11-26 NOTE — Procedures (Signed)
DIAGNOSIS:  Stage IIIC high-grade serous endometrial adenocarcinoma.  NARRATIVE:  Earlier today Krystal [name] underwent examination and preparation for her first high-dose rate treatment.  The patient was placed on the examination table in the dorsal lithotomy position.  The patient proceeded to undergo placement of a Foley catheter under sterile conditions.  This will be used for imaging of the bladder with contrast material.  The patient then proceeded to undergo serial dilatation of the introitus and pelvic examination.  The patient's vaginal introitus was quite narrowed and made the exam uncomfortable for the patient. Speculum exam showed no obvious recurrence along the vaginal vault.  The patient then underwent fitting for her vaginal cylinder.  The optimal ring and diameter arrangement to distend the vaginal vault without undue discomfort were three 2.5-cm rings placed within the proximal vagina. More distally two 2.0-cm rings were placed.  The patient overall tolerated the procedure reasonably well except for discomfort as above. The patient will proceed with CT simulation and planning for her first high-dose rate treatment later today.    ______________________________ Krystal [name], Ph.D., M.D. JDK/MEDQ  D:  11/26/2010  T:  11/26/2010  Job:  [id number]

## 2010-11-26 NOTE — Procedures (Signed)
DIAGNOSIS:  Endometrial cancer.  NARRATIVE:  Earlier today, Ms. Tiedeman underwent her first high-dose rate treatment directed at the proximal vagina.  The patient was prescribed a dose of 600 cGy to be delivered to the mucosal surface of the proximal vagina.  This was achieved with iridium-192 as the high-dose rate source using a remote afterloader.  The patient was treated with 1 channel. Treatment time was 210.2 seconds.  A total of 7 dwell positions were used to deliver the patient's treatment.  The patient tolerated the procedure well.  After completion of her therapy, a radiation survey was performed documenting return of the iridium source into the Nucletron safe.  The patient will return next week for her 2nd high-dose rate treatment.    ______________________________ Billie Lade, Ph.D., M.D. JDK/MEDQ  D:  11/26/2010  T:  11/26/2010  Job:  [id number]

## 2010-11-26 NOTE — Progress Notes (Signed)
Office Progress Note Date of Visit Nov.12, 2012  Patient is seen, alone for visit today, in continuing attention to her endometrial carcinoma, this IIIC high grade at surgery July 01, 2010. She has had 3 cycles of taxol/carboplatin through Sep 12, 2010 and is continuing radiation by Dr.Kinard. External beam completed Nov.2, 2012 and she is scheduled for HDR Nov 14, Nov 21 and Nov 28.  She had nausea related to the radiation, which has been better in the past week and appetite is good presently.  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 She is still very fatigued "I could sleep all the time", but overall continues to tolerate situation. Bowels have been moving 1- 3 times daily without laxatives now (history of chronic constipation). She denies any bleeding. The children all have respiratory infections at home.  Review of systems: no abdominal distension or abdominal or pelvic pain. Bladder ok. No SOB or other respiratory symptoms. Remainder of full 10 point ROS negative.  Physical Exam:  Weight down 2 lbs to 201. 129/87, 77, 20/not labored RA, 98.0  Hair is growing back. PERRL. Oral mucosa moist and clear. Neck supple without cervical or supraclavicular adenopathy, no axillary or inguinal adenopathy. Lungs clear to A&P. Cor RRR, clear heart sounds. Abdomen soft, nontender, not obviously distended, no appreciable HSM or masses. No inguinal adenopathy. LE no edema, cords, tenderness. Skin not remarkable. Neuro nonfocal. Portacath nontender and without erythema   Labs:  Last 10-27-10 noted  Impression/Plan 1.  IIIC high grade endometrial carcinoma in a 40 year old lady, post surgery by Dr Yolande Jolly with involvement into outer half of myometrium, lower uterine segment and cervix, + LVSI and multiple nodes involved. She had initial 3 cycles taxol/carbo and will resume chemo with 3 additional cycles beginning Dec 15, 2010. She will have CBC/CMET Nov 28 and CBC finger day of chemo. She will  have neupogen x3 days beginning Dec 10 (delay due to taxol aches). I will see her at least Dec. 19 with CBC/CMET and she will be due cycle #5 week of Dec.  25. 2. Portacath in place, last flushed Oct.15, 2012 3. Difficult social situation, with no insurance/ not [place] citizen/ 2 small children and an infant at home  [facility]

## 2010-11-26 NOTE — Progress Notes (Signed)
Pt assisted to table, inserted 12french foley catheter using sterile technique, 100cc uop clear yellow urine, pt tolerated well,taped catheter  tubing to top of right groin, pt tolertaed well, no c/o discomfort or pain,informed Md pt is ready, covered pt with sheet and warm blanket

## 2010-11-26 NOTE — Progress Notes (Signed)
Pt reconsult here for hdr internal radiation, endometrial cancer, explained need for foley cathetter 1 x,, verbal consent from pt, reviewd meds, allergies, no c/o pain

## 2010-12-02 ENCOUNTER — Telehealth: Payer: Self-pay | Admitting: *Deleted

## 2010-12-02 ENCOUNTER — Telehealth: Payer: Self-pay | Admitting: Oncology

## 2010-12-02 ENCOUNTER — Other Ambulatory Visit: Payer: Self-pay | Admitting: Oncology

## 2010-12-02 NOTE — Telephone Encounter (Signed)
Made note.

## 2010-12-02 NOTE — Progress Notes (Signed)
Encounter addended by: Delynn Flavin, RN on: 12/02/2010  7:00 PM<BR>     Documentation filed: Visit Diagnoses, Charges VN

## 2010-12-03 ENCOUNTER — Ambulatory Visit
Admission: RE | Admit: 2010-12-03 | Discharge: 2010-12-03 | Disposition: A | Discharge status: Home / Self Care | Payer: Self-pay | Source: Ambulatory Visit | Attending: Radiation Oncology | Admitting: Radiation Oncology

## 2010-12-03 NOTE — Procedures (Signed)
DIAGNOSIS:  Stage IIIC high-grade serous endometrial adenocarcinoma.  NARRATIVE:  Earlier today Krystal [name] underwent her 2nd brachy operative procedure.  VAGINAL BRACHYTHERAPY PROCEDURE:  The patient was placed on the high- dose rate treatment table.  The patient proceeded to undergo pelvic exam and serial dilatation of the vaginal introitus.  The patient's previously constructed vaginal cylinder was placed in the proximal vagina.  The vaginal cylinder was then affixed to the CT/MR stabilization plate to prevent slippage.  The patient tolerated the procedure well.  VERIFICATION AND SIMULATION:  The patient then had a fiducial marker placed within the vaginal cylinder.  An AP and lateral film was obtained in the treatment position.  This was compared to the patient's planning films last week showing good position of the vaginal cylinder for treatment.  HIGH-DOSE RATE BRACHYTHERAPY PROCEDURE:  The patient proceeded to have hookup of her remote afterloader to the vaginal cylinder.  The patient proceeded to undergo her 2nd high-dose rate treatment.  The patient was treated with iridium-192 with 7 dwell positions using 1 catheter.  A 2.5 cm diameter cylinder was used to deliver the patient's treatment.  After completion of the patient's therapy, a radiation survey was performed documenting return of the iridium source into the Nucletron safe.    ______________________________ Billie Lade, Ph.D., M.D. JDK/MEDQ  D:  12/03/2010  T:  12/03/2010  Job:  [id number]

## 2010-12-09 ENCOUNTER — Telehealth: Payer: Self-pay | Admitting: *Deleted

## 2010-12-10 ENCOUNTER — Ambulatory Visit
Admission: RE | Admit: 2010-12-10 | Discharge: 2010-12-10 | Disposition: A | Discharge status: Home / Self Care | Payer: Self-pay | Source: Ambulatory Visit | Attending: Radiation Oncology | Admitting: Radiation Oncology

## 2010-12-10 ENCOUNTER — Other Ambulatory Visit (HOSPITAL_BASED_OUTPATIENT_CLINIC_OR_DEPARTMENT_OTHER): Payer: Self-pay | Admitting: Lab

## 2010-12-10 ENCOUNTER — Telehealth: Payer: Self-pay

## 2010-12-10 DIAGNOSIS — C549 Malignant neoplasm of corpus uteri, unspecified: Secondary | ICD-10-CM

## 2010-12-10 DIAGNOSIS — C541 Malignant neoplasm of endometrium: Secondary | ICD-10-CM

## 2010-12-10 LAB — COMPREHENSIVE METABOLIC PANEL
ALT: 21 U/L (ref 0–35)
AST: 23 U/L (ref 0–37)
Albumin: 4.3 g/dL (ref 3.5–5.2)
Alkaline Phosphatase: 90 U/L (ref 39–117)
Glucose, Bld: 96 mg/dL (ref 70–99)
Potassium: 3.9 mEq/L (ref 3.5–5.3)
Sodium: 141 mEq/L (ref 135–145)
Total Protein: 7.6 g/dL (ref 6.0–8.3)

## 2010-12-10 LAB — CBC WITH DIFFERENTIAL/PLATELET
BASO%: 0.3 % (ref 0.0–2.0)
Eosinophils Absolute: 0 10*3/uL (ref 0.0–0.5)
MCHC: 31.1 g/dL — ABNORMAL LOW (ref 31.5–36.0)
MCV: 82.1 fL (ref 79.5–101.0)
MONO%: 8.5 % (ref 0.0–14.0)
NEUT#: 2.3 10*3/uL (ref 1.5–6.5)
RBC: 4.03 10*6/uL (ref 3.70–5.45)
RDW: 17.1 % — ABNORMAL HIGH (ref 11.2–14.5)
WBC: 3.3 10*3/uL — ABNORMAL LOW (ref 3.9–10.3)

## 2010-12-10 NOTE — Telephone Encounter (Signed)
TOLD MS. Goeken THAT HER BLOOD COUNTS WERE FINE TODAY FOR HER TREATMENT Monday 12-15-10.  PT. VERBALIZED UNDERSTANDING.

## 2010-12-11 NOTE — Progress Notes (Signed)
Encounter addended by: Delynn Flavin, RN on: 12/11/2010  9:22 AM<BR>     Documentation filed: Charges VN

## 2010-12-14 ENCOUNTER — Other Ambulatory Visit: Payer: Self-pay | Admitting: Oncology

## 2010-12-15 ENCOUNTER — Ambulatory Visit (HOSPITAL_BASED_OUTPATIENT_CLINIC_OR_DEPARTMENT_OTHER): Payer: Self-pay

## 2010-12-15 ENCOUNTER — Telehealth: Payer: Self-pay | Admitting: Oncology

## 2010-12-15 ENCOUNTER — Other Ambulatory Visit (HOSPITAL_BASED_OUTPATIENT_CLINIC_OR_DEPARTMENT_OTHER): Payer: Self-pay | Admitting: Lab

## 2010-12-15 VITALS — BP 143/88 | HR 99 | Temp 98.5°F

## 2010-12-15 DIAGNOSIS — Z5111 Encounter for antineoplastic chemotherapy: Secondary | ICD-10-CM

## 2010-12-15 DIAGNOSIS — C541 Malignant neoplasm of endometrium: Secondary | ICD-10-CM

## 2010-12-15 DIAGNOSIS — C549 Malignant neoplasm of corpus uteri, unspecified: Secondary | ICD-10-CM

## 2010-12-15 LAB — CBC WITH DIFFERENTIAL/PLATELET
BASO%: 0.2 % (ref 0.0–2.0)
EOS%: 0 % (ref 0.0–7.0)
LYMPH%: 9.7 % — ABNORMAL LOW (ref 14.0–49.7)
MCH: 25.8 pg (ref 25.1–34.0)
MCHC: 31.1 g/dL — ABNORMAL LOW (ref 31.5–36.0)
MCV: 83.2 fL (ref 79.5–101.0)
MONO%: 0.6 % (ref 0.0–14.0)
NEUT#: 4.2 10*3/uL (ref 1.5–6.5)
Platelets: 236 10*3/uL (ref 145–400)
RBC: 4.22 10*6/uL (ref 3.70–5.45)
RDW: 16.5 % — ABNORMAL HIGH (ref 11.2–14.5)
nRBC: 0 % (ref 0–0)

## 2010-12-15 MED ORDER — HEPARIN SOD (PORK) LOCK FLUSH 100 UNIT/ML IV SOLN
500.0000 [IU] | Freq: Once | INTRAVENOUS | Status: AC | PRN
  Administered 2010-12-15: 500 [IU]
  Filled 2010-12-15: qty 5

## 2010-12-15 MED ORDER — DEXAMETHASONE SODIUM PHOSPHATE 4 MG/ML IJ SOLN
20.0000 mg | Freq: Once | INTRAMUSCULAR | Status: AC
  Administered 2010-12-15: 20 mg via INTRAVENOUS

## 2010-12-15 MED ORDER — SODIUM CHLORIDE 0.9 % IJ SOLN
10.0000 mL | INTRAMUSCULAR | Status: DC | PRN
  Administered 2010-12-15: 10 mL
  Filled 2010-12-15: qty 10

## 2010-12-15 MED ORDER — FAMOTIDINE IN NACL 20-0.9 MG/50ML-% IV SOLN
20.0000 mg | Freq: Once | INTRAVENOUS | Status: AC
  Administered 2010-12-15: 20 mg via INTRAVENOUS

## 2010-12-15 MED ORDER — DIPHENHYDRAMINE HCL 50 MG/ML IJ SOLN
50.0000 mg | Freq: Once | INTRAMUSCULAR | Status: AC
  Administered 2010-12-15: 50 mg via INTRAVENOUS

## 2010-12-15 MED ORDER — SODIUM CHLORIDE 0.9 % IV SOLN: Freq: Once | INTRAVENOUS | Status: DC

## 2010-12-15 MED ORDER — SODIUM CHLORIDE 0.9 % IV SOLN
727.0000 mg | Freq: Once | INTRAVENOUS | Status: AC
  Administered 2010-12-15: 730 mg via INTRAVENOUS
  Filled 2010-12-15: qty 73

## 2010-12-15 MED ORDER — SODIUM CHLORIDE 0.9 % IV SOLN
16.0000 mg | Freq: Once | INTRAVENOUS | Status: AC
  Administered 2010-12-15: 16 mg via INTRAVENOUS
  Filled 2010-12-15: qty 8

## 2010-12-15 MED ORDER — PACLITAXEL CHEMO INJECTION 300 MG/50ML
175.0000 mg/m2 | Freq: Once | INTRAVENOUS | Status: AC
  Administered 2010-12-15: 360 mg via INTRAVENOUS
  Filled 2010-12-15: qty 60

## 2010-12-15 NOTE — Patient Instructions (Signed)
1545 Pt verbalized understanding of next appt date/time and to call if any questions

## 2010-12-15 NOTE — Telephone Encounter (Signed)
Put patients medicaid child care form in registration desk

## 2010-12-16 NOTE — Procedures (Signed)
DIAGNOSIS:  Endometrial cancer.  NARRATIVE:  Earlier today, Mrs. Maultsby underwent her 3rd high-dose rate brachytherapy procedure.  VAGINAL BRACHYTHERAPY PROCEDURE NOTE:  The patient was placed on the high-dose rate treatment table in the dorsal lithotomy position.  Pelvic exam was performed revealing no intravaginal masses.  The patient proceeded to undergo serial dilatation of the vaginal introitus.  The patient's previously constructed vaginal cylinder was placed in the proximal vagina.  The vaginal cylinder was then affixed to the CT/MR stabilization plate to prevent slippage.  The patient tolerated the procedure well.  VERIFICATION AND STIMULATION:  The patient's vaginal cylinder was accessed and a fiducial marker was placed within the vaginal cylinder. The patient then had an AP and lateral film obtained in the treatment position.  This was compared to the patient's planning films showing accurate position of the vaginal cylinder for treatment.  HIGH-DOSE RATE RADIATION TREATMENT:  The patient then had hooking up of the remote isolator to the patient's vaginal cylinder.  The patient then proceeded to undergo her 3rd high-dose rate treatment.  The patient was prescribed a dose of 600 cGy to be delivered to the mucosal surface. This was achieved with a total dwell time of 224.7 seconds.  The patient was treated with a 2.5 cm diameter cylinder with 7 dwell positions.  The patient tolerated the procedure well.  After completion of her therapy, a radiation survey was performed documenting return of the iridium source into the Nucletron safe.    ______________________________ Billie Lade, Ph.D., M.D. JDK/MEDQ  D:  12/10/2010  T:  12/16/2010  Job:  [id number]

## 2010-12-16 NOTE — Progress Notes (Signed)
CC:   [name], M.D. [name], M.D. [name], MD  DIAGNOSIS:  Stage III-C high-grade serous endometrial adenocarcinoma.  TREATMENT DATES: 1. External beam radiation therapy October 07, 2010 through November 10, 2010. 2. Intracavitary brachytherapy treatments on November 14th, November     21st and November 28th.  SITES/DOSE:  External beam radiation therapy, pelvis and periaortic area, 4500 cGy in 25 fractions (180 cGy per fraction).  The patient was treated with 4 conformally shaped fields encompassing the pelvis and periaortic area.  18 MV photons were used to deliver the patient's treatment.  After completion of the patient's external beam treatments, she proceeded to undergo intracavitary brachytherapy treatments using iridium 192 as the high-dose rate source.  The patient was prescribed a dose of 600 cGy to the mucosal surface.  The treatment length was 3 cm. The patient was treated with a 2.5 cm diameter cylinder.  The patient received 3 weekly treatments at 600 cGy for a cumulative brachytherapy dose of 1800 cGy.  Cumulative dose from external beam and intracavitary brachytherapy to the proximal vagina was 6300 cGy.  NARRATIVE:  [name] tolerated the treatments reasonably well.  The patient did experience some problems with nausea which was helped well with medication.  In addition, the patient did have some problems with fatigue as she progressed through the treatments.  FOLLOWUP APPOINTMENT:  One month.    ______________________________ Krystal [name], Ph.D., M.D. JDK/MEDQ  D:  12/15/2010  T:  12/16/2010  Job:  [id number]

## 2010-12-18 ENCOUNTER — Other Ambulatory Visit: Payer: Self-pay

## 2010-12-18 DIAGNOSIS — C541 Malignant neoplasm of endometrium: Secondary | ICD-10-CM

## 2010-12-18 MED ORDER — DEXAMETHASONE 4 MG PO TABS: 4.0000 mg | ORAL_TABLET | ORAL | Status: DC

## 2010-12-22 ENCOUNTER — Ambulatory Visit (HOSPITAL_BASED_OUTPATIENT_CLINIC_OR_DEPARTMENT_OTHER): Payer: Self-pay

## 2010-12-22 ENCOUNTER — Encounter (HOSPITAL_COMMUNITY): Payer: Self-pay | Admitting: *Deleted

## 2010-12-22 ENCOUNTER — Emergency Department (HOSPITAL_COMMUNITY)
Admission: EM | Admit: 2010-12-22 | Discharge: 2010-12-22 | Disposition: A | Discharge status: Home / Self Care | Payer: Self-pay | Attending: Emergency Medicine | Admitting: Emergency Medicine

## 2010-12-22 VITALS — BP 134/86 | HR 103 | Temp 97.4°F

## 2010-12-22 DIAGNOSIS — Z79899 Other long term (current) drug therapy: Secondary | ICD-10-CM | POA: Insufficient documentation

## 2010-12-22 DIAGNOSIS — R1032 Left lower quadrant pain: Secondary | ICD-10-CM | POA: Insufficient documentation

## 2010-12-22 DIAGNOSIS — M545 Low back pain, unspecified: Secondary | ICD-10-CM | POA: Insufficient documentation

## 2010-12-22 DIAGNOSIS — E669 Obesity, unspecified: Secondary | ICD-10-CM | POA: Insufficient documentation

## 2010-12-22 DIAGNOSIS — J45909 Unspecified asthma, uncomplicated: Secondary | ICD-10-CM | POA: Insufficient documentation

## 2010-12-22 DIAGNOSIS — Z8542 Personal history of malignant neoplasm of other parts of uterus: Secondary | ICD-10-CM | POA: Insufficient documentation

## 2010-12-22 DIAGNOSIS — G893 Neoplasm related pain (acute) (chronic): Secondary | ICD-10-CM

## 2010-12-22 DIAGNOSIS — C549 Malignant neoplasm of corpus uteri, unspecified: Secondary | ICD-10-CM

## 2010-12-22 DIAGNOSIS — G8929 Other chronic pain: Secondary | ICD-10-CM

## 2010-12-22 DIAGNOSIS — C541 Malignant neoplasm of endometrium: Secondary | ICD-10-CM

## 2010-12-22 DIAGNOSIS — I10 Essential (primary) hypertension: Secondary | ICD-10-CM | POA: Insufficient documentation

## 2010-12-22 MED ORDER — ACETAMINOPHEN 325 MG PO TABS
650.0000 mg | ORAL_TABLET | Freq: Once | ORAL | Status: AC
  Administered 2010-12-22: 650 mg via ORAL
  Filled 2010-12-22: qty 2

## 2010-12-22 MED ORDER — FILGRASTIM 300 MCG/0.5ML IJ SOLN
300.0000 ug | Freq: Once | INTRAMUSCULAR | Status: AC
  Administered 2010-12-22: 300 ug via SUBCUTANEOUS
  Filled 2010-12-22: qty 0.5

## 2010-12-22 NOTE — ED Provider Notes (Signed)
History     CSN: [id number] Arrival date & time: 12/22/2010  3:43 AM   First MD Initiated Contact with Patient 12/22/10 0350      Chief Complaint  Patient presents with  . Back Pain    r/t CA, hurts after chemo, last chemo monday     HPI  History provided by patient.  Pt initially presented to ED to have her daughter evaluated for fever and cough.  Pt repots having hx of uterine cancer and receiving chemotherapy with next scheduled dose later today.  While waiting for her daughter to be seen she began having increased chronic back and left side pains related to her medical problems.  Pt had asked staff for tylenol or ibuprofen but was told she needed to check into the ED if she wanted to be treated.  Pt plans to drive home with daughter.  Pt denies any change in pain symptoms.  Pt denies any weakness in legs and has been ambulatory.  Pt denies urinary or fecal incontinence.  Pt denies any dysuria, hematuria, urinary frequency. Pt denies other complaints.   Past Medical History  Diagnosis Date  . Hypertension     SINCE FIRST PREGNANCY  . Asthma   . Constipation   . History of miscarriage   . Preeclampsia   . Uterine cancer   . Postoperative ileus 07/2010  . Iron deficiency anemia     s/p transfusions [facility]    Past Surgical History  Procedure Date  . Cesarean section   . Abdominal hysterectomy 07-08-10     TAH-BSO, WITH OMENTECTOMY AND BILAT. PELVIC AND PERIAORTIC LYMPH NODE DISSECTION  . Appendectomy     IN CHILDHOOD    Family History  Problem Relation Age of Onset  . Diabetes Mother     History  Substance Use Topics  . Smoking status: Never Smoker   . Smokeless tobacco: Not on file  . Alcohol Use: No    OB History    Grav Para Term Preterm Abortions TAB SAB Ect Mult Living   4 3 2 1 1  0 1   3      Review of Systems  All other systems reviewed and are negative.    Allergies  Review of patient's allergies indicates no known allergies.  Home Medications     Current Outpatient Rx  Name Route Sig Dispense Refill  . AMLODIPINE BESYLATE 10 MG PO TABS Oral Take 10 mg by mouth daily.      Marland Kitchen CETIRIZINE HCL 10 MG PO TABS Oral Take 10 mg by mouth daily as needed.      Marland Kitchen DEXAMETHASONE 4 MG PO TABS Oral Take 1 tablet (4 mg total) by mouth as directed. Take 5 tabs =20mg  with food 12 hrs. And 6 hrs. Before taxol chemotherapy. 30 tablet   . DOCUSATE SODIUM 100 MG PO CAPS Oral Take 100 mg by mouth 2 (two) times daily as needed. bm     . GLYCERIN (LAXATIVE) 2 G RE SUPP Rectal Place 1 suppository rectally once as needed. bm     . IBUPROFEN 800 MG PO TABS Oral Take 800 mg by mouth every 8 (eight) hours as needed. pain     . LIDOCAINE-PRILOCAINE 2.5-2.5 % EX CREA Topical Apply 1 application topically as needed.      Marland Kitchen LORAZEPAM 1 MG PO TABS Oral Take 1 mg by mouth every 6 (six) hours as needed. Take 1/2 to 1 tab orally or under the tongue for nausea or to  relax     . ONE-DAILY MULTI VITAMINS PO TABS Oral Take 1 tablet by mouth daily. Take 5 tabs = 20 mg with food 12 hrs. And 6 hrs. Before taxol chemotherapy     . ONDANSETRON HCL 8 MG PO TABS Oral Take 8 mg by mouth every 12 (twelve) hours as needed. May take 1-2 tabs as needed     . OXYCODONE-ACETAMINOPHEN 5-325 MG PO TABS Oral Take 1 tablet by mouth every 4 (four) hours as needed. pain     . POLYETHYLENE GLYCOL 3350 PO PACK Oral Take 17 g by mouth 2 (two) times daily.      Marland Kitchen PROMETHAZINE HCL 25 MG PO TABS Oral Take 25 mg by mouth every 6 (six) hours as needed.        BP 152/99  Pulse 93  Resp 22  SpO2 100%  LMP 07/02/2010  Physical Exam  Nursing note and vitals reviewed. Constitutional: She is oriented to person, place, and time. She appears well-developed and well-nourished. No distress.  HENT:  Head: Normocephalic.  Neck: Normal range of motion. Neck supple.  Cardiovascular: Normal rate, regular rhythm and normal heart sounds.   Pulmonary/Chest: Effort normal. She has no wheezes. She has no rales.   Abdominal: Soft. There is tenderness in the left lower quadrant.       Obese  Musculoskeletal: Normal range of motion. She exhibits no edema and no tenderness.       Cervical back: Normal.       Thoracic back: Normal.       Lumbar back: She exhibits tenderness. She exhibits normal range of motion.       Arms: Neurological: She is alert and oriented to person, place, and time.  Skin: Skin is warm. No rash noted.  Psychiatric: She has a normal mood and affect. Her behavior is normal.    ED Course  Procedures (including critical care time)    1. Chronic pain   2. Cancer associated pain       MDM  Pt seen and evaluated.  Pt in no acute distress.        Angus Seller, PA 12/23/10 1537

## 2010-12-22 NOTE — ED Notes (Signed)
Pt alert, NAD, calm, interactive, skin W&D, resps e/u, denies needs sx questions or concerns unemt, no rx given, steady gait, declined w/c, out carrying child.

## 2010-12-22 NOTE — ED Notes (Signed)
C/o whole back pain, pt restless, intermitantly writhing, pacing, unable to remain still or find positon of comfort, relates pain to post chemo, usually has pain after chemo, last chemo on Monday, does not want anything strong, has an appt for an injection at 1600 today, states, "just give me tylenol", actually here to have daughter seen, but her pain is intolerable. (Denies: nvd, fever or other sx), "just pain".

## 2010-12-23 ENCOUNTER — Ambulatory Visit (HOSPITAL_BASED_OUTPATIENT_CLINIC_OR_DEPARTMENT_OTHER): Payer: Self-pay

## 2010-12-23 VITALS — BP 133/87 | HR 92 | Temp 97.3°F

## 2010-12-23 DIAGNOSIS — C541 Malignant neoplasm of endometrium: Secondary | ICD-10-CM

## 2010-12-23 DIAGNOSIS — Z5189 Encounter for other specified aftercare: Secondary | ICD-10-CM

## 2010-12-23 DIAGNOSIS — C549 Malignant neoplasm of corpus uteri, unspecified: Secondary | ICD-10-CM

## 2010-12-23 MED ORDER — FILGRASTIM 300 MCG/0.5ML IJ SOLN
300.0000 ug | Freq: Once | INTRAMUSCULAR | Status: AC
  Administered 2010-12-23: 300 ug via SUBCUTANEOUS
  Filled 2010-12-23: qty 0.5

## 2010-12-24 ENCOUNTER — Emergency Department (HOSPITAL_COMMUNITY): Payer: Self-pay

## 2010-12-24 ENCOUNTER — Other Ambulatory Visit: Payer: Self-pay

## 2010-12-24 ENCOUNTER — Telehealth: Payer: Self-pay

## 2010-12-24 ENCOUNTER — Ambulatory Visit: Payer: Self-pay

## 2010-12-24 ENCOUNTER — Emergency Department (HOSPITAL_COMMUNITY)
Admission: EM | Admit: 2010-12-24 | Discharge: 2010-12-24 | Disposition: A | Discharge status: Home / Self Care | Payer: Self-pay | Attending: Emergency Medicine | Admitting: Emergency Medicine

## 2010-12-24 ENCOUNTER — Encounter (HOSPITAL_COMMUNITY): Payer: Self-pay | Admitting: Emergency Medicine

## 2010-12-24 ENCOUNTER — Telehealth: Payer: Self-pay | Admitting: *Deleted

## 2010-12-24 DIAGNOSIS — R079 Chest pain, unspecified: Secondary | ICD-10-CM | POA: Insufficient documentation

## 2010-12-24 DIAGNOSIS — M542 Cervicalgia: Secondary | ICD-10-CM | POA: Insufficient documentation

## 2010-12-24 DIAGNOSIS — J45909 Unspecified asthma, uncomplicated: Secondary | ICD-10-CM | POA: Insufficient documentation

## 2010-12-24 DIAGNOSIS — I10 Essential (primary) hypertension: Secondary | ICD-10-CM | POA: Insufficient documentation

## 2010-12-24 LAB — BASIC METABOLIC PANEL
BUN: 12 mg/dL (ref 6–23)
CO2: 25 mEq/L (ref 19–32)
Calcium: 10 mg/dL (ref 8.4–10.5)
Chloride: 102 mEq/L (ref 96–112)
Creatinine, Ser: 0.58 mg/dL (ref 0.50–1.10)
GFR calc Af Amer: >90 mL/min (ref >90)
GFR calc non Af Amer: >90 mL/min (ref >90)
Glucose, Bld: 85 mg/dL (ref 70–99)
Potassium: 3.5 mEq/L (ref 3.5–5.1)
Sodium: 140 mEq/L (ref 135–145)

## 2010-12-24 LAB — CARDIAC PANEL(CRET KIN+CKTOT+MB+TROPI)
CK, MB: 2.1 ng/mL (ref 0.3–4.0)
CK, MB: 2 ng/mL (ref 0.3–4.0)
Relative Index: 1.2 (ref 0.0–2.5)
Relative Index: 1.2 (ref 0.0–2.5)
Total CK: 172 U/L (ref 7–177)
Total CK: 166 U/L (ref 7–177)
Troponin I: <0.3 ng/mL (ref <0.30)

## 2010-12-24 LAB — CBC
HCT: 30.5 % — ABNORMAL LOW (ref 36.0–46.0)
Hemoglobin: 9.5 g/dL — ABNORMAL LOW (ref 12.0–15.0)
MCH: 26 pg (ref 26.0–34.0)
MCHC: 31.1 g/dL (ref 30.0–36.0)
MCV: 83.6 fL (ref 78.0–100.0)
Platelets: 223 10*3/uL (ref 150–400)
RBC: 3.65 MIL/uL — ABNORMAL LOW (ref 3.87–5.11)
RDW: 16 % — ABNORMAL HIGH (ref 11.5–15.5)
WBC: 3.1 10*3/uL — ABNORMAL LOW (ref 4.0–10.5)

## 2010-12-24 MED ORDER — MORPHINE SULFATE 2 MG/ML IJ SOLN
2.0000 mg | Freq: Once | INTRAMUSCULAR | Status: AC
  Administered 2010-12-24: 2 mg via INTRAVENOUS

## 2010-12-24 MED ORDER — IOHEXOL 300 MG/ML  SOLN
70.0000 mL | Freq: Once | INTRAMUSCULAR | Status: AC | PRN
  Administered 2010-12-24: 70 mL via INTRAVENOUS

## 2010-12-24 MED ORDER — IOHEXOL 350 MG/ML SOLN
50.0000 mL | Freq: Once | INTRAVENOUS | Status: AC | PRN
  Administered 2010-12-24: 50 mL via INTRAVENOUS

## 2010-12-24 MED ORDER — MORPHINE SULFATE 4 MG/ML IJ SOLN
6.0000 mg | Freq: Once | INTRAMUSCULAR | Status: DC
  Filled 2010-12-24: qty 1

## 2010-12-24 MED ORDER — HEPARIN SOD (PORK) LOCK FLUSH 100 UNIT/ML IV SOLN
500.0000 [IU] | INTRAVENOUS | Status: AC | PRN
  Administered 2010-12-24: 500 [IU]

## 2010-12-24 MED ORDER — MORPHINE SULFATE 2 MG/ML IJ SOLN
INTRAMUSCULAR | Status: AC
  Filled 2010-12-24: qty 1

## 2010-12-24 MED ORDER — MORPHINE SULFATE 4 MG/ML IJ SOLN
4.0000 mg | Freq: Once | INTRAMUSCULAR | Status: AC
  Administered 2010-12-24: 4 mg via INTRAVENOUS

## 2010-12-24 NOTE — ED Notes (Signed)
Pt began having substernal chest pain that radiated to both sides of neck at 0600 today.  States was already awake at onset.  Reports pain was worse with movement.  Denies sob/ nausea.  States pain was 8/10 at worse, but now 0/10.  Per [facility], NTG x 1 given en route.  Sinus tach on monitor with initial BP of 152/98 then 144/100.  Hx HTN and uterine cancer.

## 2010-12-24 NOTE — ED Provider Notes (Signed)
History     CSN: [id number] Arrival date & time: 12/24/2010 12:21 PM   First MD Initiated Contact with Patient 12/24/10 1241      Chief Complaint  Patient presents with  . Chest Pain    (Consider location/radiation/quality/duration/timing/severity/associated sxs/prior treatment) HPI  40 y.o. female presents to the emergency department c/o chest and neck pain.  Pt awoke with pain at 6 am rating it at a 9/10 and describing it as sharp and changing with movement.  Pain in located in the center of her chest and in the back of her neck, she does not describe the chest pain as radiating into her neck.  Pt took mucinex after waking up, thinking the pain was related to a cold, but when it did not abate, she called 911.  She then took 325mg  ASA and EMS gave nitro 0.4mg  SL.  Pt states pain abated completely about 15 min after the nitro given.  Denies shortness of breath, diaphoresis, headache, nausea, vomiting, diarrhea, abdominal pain, paresthesias, weakness, cough, congestion, fever or fatigue.  She denies pain at initial assessment in the ED.  PMHx HTN and uterine CA with last chemo treatment on 12/22/10.  She is due for a Neupogen shot today.    Past Medical History  Diagnosis Date  . Hypertension     SINCE FIRST PREGNANCY  . Asthma   . Constipation   . History of miscarriage   . Preeclampsia   . Uterine cancer   . Postoperative ileus 07/2010  . Iron deficiency anemia     s/p transfusions [facility]    Past Surgical History  Procedure Date  . Cesarean section   . Abdominal hysterectomy 07-08-10     TAH-BSO, WITH OMENTECTOMY AND BILAT. PELVIC AND PERIAORTIC LYMPH NODE DISSECTION  . Appendectomy     IN CHILDHOOD    Family History  Problem Relation Age of Onset  . Diabetes Mother     History  Substance Use Topics  . Smoking status: Never Smoker   . Smokeless tobacco: Not on file  . Alcohol Use: No    OB History    Grav Para Term Preterm Abortions TAB SAB Ect Mult Living   4 3 2  1 1  0 1   3      Review of Systems  All pertinent positives and negatives in the history of present illness  Allergies  Review of patient's allergies indicates no known allergies.  Home Medications   Current Outpatient Rx  Name Route Sig Dispense Refill  . AMLODIPINE BESYLATE 10 MG PO TABS Oral Take 10 mg by mouth daily.      Marland Kitchen CETIRIZINE HCL 10 MG PO TABS Oral Take 10 mg by mouth daily as needed. For allergies    . DEXAMETHASONE 4 MG PO TABS Oral Take 4 mg by mouth as directed. Take 5 tabs =20mg  with food 12 hrs. And 6 hrs. Before taxol chemotherapy.     . DOCUSATE SODIUM 100 MG PO CAPS Oral Take 100 mg by mouth 2 (two) times daily as needed. For constipation    . GLYCERIN (LAXATIVE) 2 G RE SUPP Rectal Place 1 suppository rectally once as needed. For constipation    . IBUPROFEN 800 MG PO TABS Oral Take 800 mg by mouth every 8 (eight) hours as needed. pain     . LIDOCAINE-PRILOCAINE 2.5-2.5 % EX CREA Topical Apply 1 application topically as needed.      Marland Kitchen LORAZEPAM 1 MG PO TABS Oral Take 0.5-1 mg  by mouth every 6 (six) hours as needed. Take 1/2 to 1 tab orally or under the tongue for nausea or to relax    . ONE-DAILY MULTI VITAMINS PO TABS Oral Take 1 tablet by mouth daily. Take 5 tabs = 20 mg with food 12 hrs. And 6 hrs. Before taxol chemotherapy     . ONDANSETRON HCL 8 MG PO TABS Oral Take 8 mg by mouth every 12 (twelve) hours as needed. May take 1-2 tabs as needed for nausea    . OXYCODONE-ACETAMINOPHEN 5-325 MG PO TABS Oral Take 1 tablet by mouth every 4 (four) hours as needed. pain     . POLYETHYLENE GLYCOL 3350 PO PACK Oral Take 17 g by mouth 2 (two) times daily.      Marland Kitchen PROMETHAZINE HCL 25 MG PO TABS Oral Take 25 mg by mouth every 6 (six) hours as needed. For nausea      BP 129/82  Pulse 96  Temp(Src) 98.6 F (37 C) (Oral)  Resp 16  Ht 5\' 6"  (1.676 m)  Wt 205 lb (92.987 kg)  BMI 33.09 kg/m2  SpO2 100%  LMP 07/02/2010  Physical Exam  Constitutional: She is oriented to  person, place, and time. She appears well-developed and well-nourished. No distress.  HENT:  Head: Normocephalic and atraumatic.  Right Ear: External ear normal.  Left Ear: External ear normal.  Nose: Nose normal.  Mouth/Throat: Oropharynx is clear and moist.  Eyes: Conjunctivae and EOM are normal. Pupils are equal, round, and reactive to light.  Neck: Normal range of motion. Neck supple.  Cardiovascular: Normal rate, regular rhythm, S1 normal, S2 normal and intact distal pulses.  Exam reveals no gallop and no friction rub.   No murmur heard. Pulmonary/Chest: Effort normal and breath sounds normal. No respiratory distress. She has no decreased breath sounds. She has no wheezes. She has no rhonchi. She has no rales. She exhibits no tenderness.  Abdominal: Soft. Bowel sounds are normal. She exhibits no distension and no mass. There is no hepatosplenomegaly. There is no tenderness. There is no rebound and no guarding.  Lymphadenopathy:    She has no cervical adenopathy.       Right: No supraclavicular adenopathy present.       Left: No supraclavicular adenopathy present.  Neurological: She is alert and oriented to person, place, and time. She has normal strength. She exhibits normal muscle tone.  Skin: Skin is warm, dry and intact. She is not diaphoretic. No pallor.  Psychiatric: She has a normal mood and affect. Her behavior is normal. Judgment and thought content normal.    ED Course  Procedures (including critical care time)  Labs Reviewed  CBC - Abnormal; Notable for the following:    WBC 3.1 (*)    RBC 3.65 (*)    Hemoglobin 9.5 (*)    HCT 30.5 (*)    RDW 16.0 (*)    All other components within normal limits  BASIC METABOLIC PANEL  CARDIAC PANEL(CRET KIN+CKTOT+MB+TROPI)   Dg Chest 2 View  12/24/2010  *RADIOLOGY REPORT*  Clinical Data: Chest pain, hypertension, asthma.  CHEST - 2 VIEW  Comparison: 06/13/2010  Findings: Right Port-A-Cath is in place with the tip in the SVC. No  pneumothorax. Heart and mediastinal contours are within normal limits.  No focal opacities or effusions.  No acute bony abnormality.  IMPRESSION: No active cardiopulmonary disease.  Original Report Authenticated By: Cyndie Chime, M.D.      We will get a CTA of  her chest to look for pulmonary and was due to her moderate risk for PE due to her cancer history and current treatment.  This Chest pain seems atypical for cardiac chest pain based on her history of present illness and physical exam.  Patient is a normal vital signs here and has not been hypoxic.    MDM      Date: 12/24/2010  Rate: 82  Rhythm: normal sinus rhythm  QRS Axis: normal  Intervals: normal  ST/T Wave abnormalities: normal  Conduction Disutrbances:none  Narrative Interpretation:   Old EKG Reviewed: none available        Carlyle Dolly, PA-C 12/24/10 1551

## 2010-12-24 NOTE — ED Notes (Signed)
IV team nurse at pt bedside at this time for port access.

## 2010-12-24 NOTE — ED Notes (Signed)
Pt. Discharged to home after port access removed by IV Nurse

## 2010-12-24 NOTE — ED Notes (Signed)
Called IV team to de-access port.  IV team reports they are going through shift change and that it would be a few min.

## 2010-12-24 NOTE — ED Provider Notes (Signed)
Medical screening examination/treatment/procedure(s) were performed by non-physician practitioner and as supervising physician I was immediately available for consultation/collaboration.   Dayton Bailiff, MD 12/24/10 [phone]

## 2010-12-24 NOTE — Telephone Encounter (Signed)
Notified Louise(Dr Livesay's nurse) of missed appointment and call to patient

## 2010-12-24 NOTE — Telephone Encounter (Signed)
SPOKE WITH Krystal [name].  SHE STATED THAT SHE DID NOT COME FOR HER NEUPOGEN INJECTION THIS AFTERNOON BECAUSE SHE IS IN THE [place] WITH CHEST PAIN.  SHE STATED THAT SHE INFORMED THE ED DOCTOR THAT SHE NEEDED A NEUPOGEN INJECTION TODAY.  NOTIFIED DR. Darrold Span OF ABOVE.

## 2010-12-24 NOTE — ED Notes (Signed)
IV team notified of need for port access for med administration.

## 2010-12-24 NOTE — ED Notes (Signed)
Patients Ct angio chest negative for PE.  Patient stable for discharge home.  Patient did express concern that she missed getting her third neupogen injection from oncology today since she was here.  Spoke with Dr. Venda Rodes with hem/onc.  She can call in the morning for an appt time for neupogen tomorrow.    Candis Musa, PA 12/24/10 1854  Geoffery Lyons, MD 01/26/14 1220

## 2010-12-24 NOTE — ED Provider Notes (Signed)
Medical screening examination/treatment/procedure(s) were conducted as a shared visit with non-physician practitioner(s) and myself.  I personally evaluated the patient during the encounter  Hx uterine CA, awoke with posterior neck pain and chest pain at 6 am.  No trauma.  CP improved. No meningismus, normal neuro exam.  Glynn Octave, MD 12/24/10 [phone]

## 2010-12-25 ENCOUNTER — Ambulatory Visit (HOSPITAL_BASED_OUTPATIENT_CLINIC_OR_DEPARTMENT_OTHER): Payer: Self-pay

## 2010-12-25 VITALS — BP 117/73 | HR 93 | Temp 97.8°F

## 2010-12-25 DIAGNOSIS — C541 Malignant neoplasm of endometrium: Secondary | ICD-10-CM

## 2010-12-25 DIAGNOSIS — C549 Malignant neoplasm of corpus uteri, unspecified: Secondary | ICD-10-CM

## 2010-12-25 MED ORDER — FILGRASTIM 300 MCG/0.5ML IJ SOLN
300.0000 ug | Freq: Once | INTRAMUSCULAR | Status: AC
  Administered 2010-12-25: 300 ug via SUBCUTANEOUS
  Filled 2010-12-25: qty 0.5

## 2010-12-31 ENCOUNTER — Other Ambulatory Visit (HOSPITAL_BASED_OUTPATIENT_CLINIC_OR_DEPARTMENT_OTHER): Payer: Self-pay | Admitting: Lab

## 2010-12-31 ENCOUNTER — Ambulatory Visit (HOSPITAL_BASED_OUTPATIENT_CLINIC_OR_DEPARTMENT_OTHER): Payer: Self-pay | Admitting: Oncology

## 2010-12-31 ENCOUNTER — Other Ambulatory Visit: Payer: Self-pay

## 2010-12-31 VITALS — BP 134/84 | HR 92 | Temp 99.7°F | Ht 66.0 in | Wt 201.4 lb

## 2010-12-31 DIAGNOSIS — K439 Ventral hernia without obstruction or gangrene: Secondary | ICD-10-CM

## 2010-12-31 DIAGNOSIS — R110 Nausea: Secondary | ICD-10-CM

## 2010-12-31 DIAGNOSIS — K5900 Constipation, unspecified: Secondary | ICD-10-CM

## 2010-12-31 DIAGNOSIS — C541 Malignant neoplasm of endometrium: Secondary | ICD-10-CM

## 2010-12-31 DIAGNOSIS — C549 Malignant neoplasm of corpus uteri, unspecified: Secondary | ICD-10-CM

## 2010-12-31 LAB — CBC WITH DIFFERENTIAL/PLATELET
BASO%: 0.3 % (ref 0.0–2.0)
EOS%: 0 % (ref 0.0–7.0)
HCT: 30.4 % — ABNORMAL LOW (ref 34.8–46.6)
LYMPH%: 23.3 % (ref 14.0–49.7)
MCH: 25.8 pg (ref 25.1–34.0)
MCHC: 30.9 g/dL — ABNORMAL LOW (ref 31.5–36.0)
NEUT%: 67.3 % (ref 38.4–76.8)
lymph#: 0.7 10*3/uL — ABNORMAL LOW (ref 0.9–3.3)

## 2010-12-31 MED ORDER — ONDANSETRON HCL 8 MG PO TABS: ORAL_TABLET | ORAL | Status: DC

## 2010-12-31 MED ORDER — POLYETHYLENE GLYCOL 3350 17 GM/SCOOP PO POWD: 17.0000 g | Freq: Two times a day (BID) | ORAL | Status: AC | PRN

## 2010-12-31 NOTE — Patient Instructions (Signed)
[facility] [phone]

## 2011-01-01 ENCOUNTER — Telehealth: Payer: Self-pay | Admitting: Oncology

## 2011-01-01 NOTE — Progress Notes (Signed)
OFFICE PROGRESS NOTE Date of Visit 12-31-2010 Physicians: J.Kinard, P.Gehrig  INTERVAL HISTORY:   Patient is seen, alone for visit today, in continuing attention to her IIIC high grade endometrial carcinoma, treated to this point with surgery 07-01-2010 by Dr Yolande Jolly with involvement into outer half of myometrium, lower uterine segment and cervix, + LVSI and multiple nodes involved. She had  3 cycles of taxol/carboplatin thru late Aug, RT by Krystal [name] (external beam then 3 HDR thru Nov 28) and #4 taxol/carbo given 3Dec 2012 with neupogen x3 beginning 10Dec2012.  She presented to ED after second neupogen due to chest and upper back/shoulder pain with no findings of note on CT angio chest and evaluation otherwise consistent with pain from neupogen. She does have pain medication on hand now if similar symptoms after next gCSF. She needed more prn antiemetics, nausea now resolved. She had increased constipation, no BM x 5 days after running out of miralax. We have no samples of anitemetics or laxatives now;  I have spoken with our financial staff Krystal [name] and Krystal [name]) and wit LM for Child psychotherapist. She does have assistance funds that can be used at [facility] outpatient pharmacy, and application will be made for zofran from company. She does have decadron for next treatment. For past few days, she has been aware of central abdominal protuberance, which is not painful. She lifts her baby and small children regularly.  Review of Systems otherwise: no fever or symptoms of infection. Pushes po fluids (water) then up multiple times thru night to void. As she goes to bed by 2130, I have told her not to drink after 1830. No HA, no worse SOB, no cough, no pain now. No significant peripheral neuropathy symptoms. Remainder of 10 point ROS negative.  Objective:  Vital signs in last 24 hours:  BP 134/84  Pulse 92  Temp(Src) 99.7 F (37.6 C) (Oral)  Ht 5\' 6"  (1.676 m)  Wt 201 lb 6.4 oz (91.354 kg)  BMI 32.51 kg/m2   LMP 07/02/2010  Alert. Looks comfortable, easily mobile without assistance  HEENT:mucous membranes moist, pharynx normal without lesions. PERRL not icteric. LymphaticsCervical, supraclavicular, and axillary nodes normal.No inguinal adenopathy Resp: clear to auscultation bilaterally and normal percussion bilaterally Cardio: regular rate and rhythm GI: soft, nontender, few BS, no appreciable HSM/masses. Ventral hernia apparent, defect palpable ~ 6 cm in upper surgical incision, reducible, nontenter. Extremities: extremities normal, atraumatic, no cyanosis or edema Neuro:no sensory deficits noted  Portacath without erythema or tenderness  Lab Results:   Basename 12/31/10 1222  WBC 3.0*  HGB 9.4*  HCT 30.4*  PLT 195  ANC 2.0  BMET No results found for this basename: NA:2,K:2,CL:2,CO2:2,GLUCOSE:2,BUN:2,CREATININE:2,CALCIUM:2 in the last 72 hours  Studies/Results:  No results found.  Medications: I have reviewed the patient's current medications.  Assessment/Plan:  1. IIIC high grade endometrial carcinoma in 40 year old lady, adjuvant therapy continuing. She will have cycle 5 taxol/carbo on 01-07-2011 as long as ANC >= 1.5 and plt >=100k that day (will take decadron 20 mg 12 hrs and 6 hrs prior), then neupogen Jan 2-3-4 due to previous neutropenia. She is scheduled back to [facility] Jan 4 and to Krystal [name] Jan 14; I will see her with CBC/CMET also Jan 14, coordinating with KrystalKinard's visit. She will be due cycle 6 chemo Jan 16. 2.Ventral hernia: not symptomatic. She will discuss with KrystalClarkePearson at upcoming visit. She will try some light support garment. 3.Portacath in place 4.Difficult social situation with very limited finances, not [place] citizen,  2 small children + infant. Appreciate all assistance from [facility] support staff. 5.Chronic constipation much better on regular miralax.        Krystal [name] P, MD   01/01/2011, 9:13 AM

## 2011-01-01 NOTE — Telephone Encounter (Signed)
Patient received two prescription from [facility] on 12/31/10 $24.22,her remaning balance [facility] $145.97.

## 2011-01-07 ENCOUNTER — Other Ambulatory Visit (HOSPITAL_BASED_OUTPATIENT_CLINIC_OR_DEPARTMENT_OTHER): Payer: Self-pay | Admitting: Lab

## 2011-01-07 ENCOUNTER — Ambulatory Visit (HOSPITAL_BASED_OUTPATIENT_CLINIC_OR_DEPARTMENT_OTHER): Payer: Self-pay

## 2011-01-07 ENCOUNTER — Other Ambulatory Visit: Payer: Self-pay | Admitting: Certified Registered Nurse Anesthetist

## 2011-01-07 ENCOUNTER — Other Ambulatory Visit: Payer: Self-pay | Admitting: *Deleted

## 2011-01-07 VITALS — BP 131/85 | HR 92 | Temp 98.2°F

## 2011-01-07 DIAGNOSIS — C541 Malignant neoplasm of endometrium: Secondary | ICD-10-CM

## 2011-01-07 DIAGNOSIS — C549 Malignant neoplasm of corpus uteri, unspecified: Secondary | ICD-10-CM

## 2011-01-07 DIAGNOSIS — Z5111 Encounter for antineoplastic chemotherapy: Secondary | ICD-10-CM

## 2011-01-07 LAB — CBC WITH DIFFERENTIAL/PLATELET
EOS%: 0 % (ref 0.0–7.0)
MCH: 26 pg (ref 25.1–34.0)
MCV: 83.6 fL (ref 79.5–101.0)
MONO%: 0.6 % (ref 0.0–14.0)
NEUT#: 2.8 10*3/uL (ref 1.5–6.5)
RBC: 3.65 10*6/uL — ABNORMAL LOW (ref 3.70–5.45)
RDW: 16.4 % — ABNORMAL HIGH (ref 11.2–14.5)
nRBC: 0 % (ref 0–0)

## 2011-01-07 MED ORDER — FAMOTIDINE IN NACL 20-0.9 MG/50ML-% IV SOLN
20.0000 mg | Freq: Once | INTRAVENOUS | Status: AC
  Administered 2011-01-07: 20 mg via INTRAVENOUS

## 2011-01-07 MED ORDER — SODIUM CHLORIDE 0.9 % IJ SOLN
10.0000 mL | INTRAMUSCULAR | Status: DC | PRN
  Administered 2011-01-07: 10 mL
  Filled 2011-01-07: qty 10

## 2011-01-07 MED ORDER — SODIUM CHLORIDE 0.9 % IV SOLN
727.0000 mg | Freq: Once | INTRAVENOUS | Status: AC
  Administered 2011-01-07: 730 mg via INTRAVENOUS
  Filled 2011-01-07: qty 73

## 2011-01-07 MED ORDER — PACLITAXEL CHEMO INJECTION 300 MG/50ML
175.0000 mg/m2 | Freq: Once | INTRAVENOUS | Status: AC
  Administered 2011-01-07: 360 mg via INTRAVENOUS
  Filled 2011-01-07: qty 60

## 2011-01-07 MED ORDER — DIPHENHYDRAMINE HCL 50 MG/ML IJ SOLN
50.0000 mg | Freq: Once | INTRAMUSCULAR | Status: AC
  Administered 2011-01-07: 50 mg via INTRAVENOUS

## 2011-01-07 MED ORDER — HEPARIN SOD (PORK) LOCK FLUSH 100 UNIT/ML IV SOLN
500.0000 [IU] | Freq: Once | INTRAVENOUS | Status: AC | PRN
  Administered 2011-01-07: 500 [IU]
  Filled 2011-01-07: qty 5

## 2011-01-07 MED ORDER — SODIUM CHLORIDE 0.9 % IV SOLN: 16.0000 mg | Freq: Once | INTRAVENOUS | Status: DC

## 2011-01-07 MED ORDER — ONDANSETRON 16 MG/50ML IVPB (CHCC)
16.0000 mg | Freq: Once | INTRAVENOUS | Status: AC
  Administered 2011-01-07: 16 mg via INTRAVENOUS

## 2011-01-07 MED ORDER — DEXAMETHASONE SODIUM PHOSPHATE 4 MG/ML IJ SOLN
20.0000 mg | Freq: Once | INTRAMUSCULAR | Status: AC
  Administered 2011-01-07: 20 mg via INTRAVENOUS

## 2011-01-07 MED ORDER — SODIUM CHLORIDE 0.9 % IV SOLN
Freq: Once | INTRAVENOUS | Status: AC
  Administered 2011-01-07: 14:00:00 via INTRAVENOUS

## 2011-01-14 ENCOUNTER — Encounter: Payer: Self-pay | Admitting: Gynecologic Oncology

## 2011-01-14 ENCOUNTER — Ambulatory Visit (HOSPITAL_BASED_OUTPATIENT_CLINIC_OR_DEPARTMENT_OTHER): Payer: Self-pay

## 2011-01-14 VITALS — BP 123/84 | HR 100 | Temp 97.6°F

## 2011-01-14 DIAGNOSIS — C541 Malignant neoplasm of endometrium: Secondary | ICD-10-CM

## 2011-01-14 DIAGNOSIS — C549 Malignant neoplasm of corpus uteri, unspecified: Secondary | ICD-10-CM

## 2011-01-14 MED ORDER — FILGRASTIM 300 MCG/0.5ML IJ SOLN
300.0000 ug | Freq: Once | INTRAMUSCULAR | Status: AC
  Administered 2011-01-14: 300 ug via SUBCUTANEOUS
  Filled 2011-01-14: qty 0.5

## 2011-01-15 ENCOUNTER — Ambulatory Visit (HOSPITAL_BASED_OUTPATIENT_CLINIC_OR_DEPARTMENT_OTHER): Payer: Self-pay

## 2011-01-15 VITALS — BP 137/91 | HR 101 | Temp 99.2°F

## 2011-01-15 DIAGNOSIS — C549 Malignant neoplasm of corpus uteri, unspecified: Secondary | ICD-10-CM

## 2011-01-15 DIAGNOSIS — C541 Malignant neoplasm of endometrium: Secondary | ICD-10-CM

## 2011-01-15 MED ORDER — FILGRASTIM 300 MCG/0.5ML IJ SOLN
300.0000 ug | Freq: Once | INTRAMUSCULAR | Status: AC
  Administered 2011-01-15: 300 ug via SUBCUTANEOUS
  Filled 2011-01-15: qty 0.5

## 2011-01-16 ENCOUNTER — Ambulatory Visit: Payer: Self-pay | Attending: Gynecology | Admitting: Gynecology

## 2011-01-16 ENCOUNTER — Encounter: Payer: Self-pay | Admitting: Gynecology

## 2011-01-16 ENCOUNTER — Other Ambulatory Visit: Payer: Self-pay | Admitting: Physician Assistant

## 2011-01-16 ENCOUNTER — Ambulatory Visit (HOSPITAL_BASED_OUTPATIENT_CLINIC_OR_DEPARTMENT_OTHER): Payer: Self-pay

## 2011-01-16 VITALS — BP 125/88 | HR 90 | Temp 100.5°F

## 2011-01-16 VITALS — BP 118/60 | HR 72 | Temp 99.3°F | Resp 16 | Ht 66.0 in | Wt 200.8 lb

## 2011-01-16 DIAGNOSIS — C541 Malignant neoplasm of endometrium: Secondary | ICD-10-CM

## 2011-01-16 DIAGNOSIS — C549 Malignant neoplasm of corpus uteri, unspecified: Secondary | ICD-10-CM

## 2011-01-16 MED ORDER — FILGRASTIM 300 MCG/0.5ML IJ SOLN
300.0000 ug | Freq: Once | INTRAMUSCULAR | Status: AC
  Administered 2011-01-16: 300 ug via SUBCUTANEOUS
  Filled 2011-01-16: qty 0.5

## 2011-01-16 NOTE — Progress Notes (Signed)
Consult Note: Gyn-Onc   Krystal [name] 41 y.o. female  Chief Complaint  Patient presents with  . Endometrial cancer    Follow-up    Interval History: The patient returns today for a scheduled visit. She is now received a total of 5 cycles of carboplatin Taxol chemotherapy with her final (6) cycles scheduled for mid January. She's had a moderate amount of nausea associated with the chemotherapy. She is receiving Neulasta or marrow support. She denies any neuropathy. She has no other GI or GU symptoms and functional status is good.  HPI:The patient underwent a total abdominal  hysterectomy, bilateral salpingo-oophorectomy, pelvic and periaortic  lymphadenectomy, and omentectomy on July 08, 2010. 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 [place]wich  chemotherapy and radiation therapy was advised,    No Known Allergies  Past Medical History  Diagnosis Date  . Hypertension     SINCE FIRST PREGNANCY  . Asthma   . Constipation   . History of miscarriage   . Preeclampsia   . Uterine cancer   . Postoperative ileus 07/2010  . Iron deficiency anemia     s/p transfusions [facility]    Past Surgical History  Procedure Date  . Cesarean section   . Abdominal hysterectomy 07-08-10     TAH-BSO, WITH OMENTECTOMY AND BILAT. PELVIC AND PERIAORTIC LYMPH NODE DISSECTION  . Appendectomy     IN CHILDHOOD    Current Outpatient Prescriptions  Medication Sig Dispense Refill  . amLODipine (NORVASC) 10 MG tablet Take 10 mg by mouth daily.        . cetirizine (ZYRTEC) 10 MG tablet Take 10 mg by mouth daily as needed. For allergies      . dexamethasone (DECADRON) 4 MG tablet Take 4 mg by mouth as directed. Take 5 tabs =20mg  with food 12 hrs. And 6 hrs. Before taxol chemotherapy.       . docusate sodium (COLACE) 100 MG capsule Take 100 mg  by mouth 2 (two) times daily as needed. For constipation      . glycerin adult (GLYCERIN ADULT) 2 G SUPP Place 1 suppository rectally once as needed. For constipation      . ibuprofen (ADVIL,MOTRIN) 800 MG tablet Take 800 mg by mouth every 8 (eight) hours as needed. pain       . lidocaine-prilocaine (EMLA) cream Apply 1 application topically as needed.        Marland Kitchen LORazepam (ATIVAN) 1 MG tablet Take 0.5-1 mg by mouth every 6 (six) hours as needed. Take 1/2 to 1 tab orally or under the tongue for nausea or to relax      . Multiple Vitamin (MULTIVITAMIN) tablet Take 1 tablet by mouth daily. Take 5 tabs = 20 mg with food 12 hrs. And 6 hrs. Before taxol chemotherapy       . ondansetron (ZOFRAN) 8 MG tablet May take 1-2 tabs as needed for nausea  30 tablet  1  . oxyCODONE-acetaminophen (PERCOCET) 5-325 MG per tablet Take 1 tablet by mouth every 4 (four) hours as needed. pain       . promethazine (PHENERGAN) 25 MG tablet Take 25 mg by mouth every 6 (six) hours as needed. For nausea       No current facility-administered medications for this visit.   Facility-Administered Medications Ordered in Other Visits  Medication  Dose Route Frequency Provider Last Rate Last Dose  . filgrastim (NEUPOGEN) injection 300 mcg  300 mcg Subcutaneous Once [name], MD   300 mcg at 01/15/11 1627    History   Social History  . Marital Status: Single    Spouse Name: N/A    Number of Children: N/A  . Years of Education: N/A   Occupational History  . Not on file.   Social History Main Topics  . Smoking status: Never Smoker   . Smokeless tobacco: Not on file  . Alcohol Use: No  . Drug Use: No  . Sexually Active: Not Currently     due to surgery   Other Topics Concern  . Not on file   Social History Narrative  . No narrative on file    Family History  Problem Relation Age of Onset  . Diabetes Mother     Review of Systems: 10 point review of systems negative except as noted above  Vitals: Blood  pressure 118/60, pulse 72, temperature 99.3 F (37.4 C), temperature source Oral, resp. rate 16, height 5\' 6"  (1.676 m), weight 200 lb 12.8 oz (91.082 kg), last menstrual period 07/02/2010.  Physical Exam: General the patient is a healthy African female in no acute distress HEENT is negative neck supple without thyromegaly there is no supra-auricular or inguinal adenopathy  The abdomen is soft and nontender she is easily reducible ventral hernia at the apex of her midline incision. No masses organomegaly or ascites are noted.  Pelvic exam EGBUS vagina bladder urethra are normal cervix and uterus are surgically absent adnexa are without masses rectovaginal exam confirms  lower extremities without edema varicosity  Assessment/Plan: Impression stage IIIC to poorly differentiated endometrial carcinoma.  She will go ahead with her sixth and final cycle of carboplatin Taxol mid-January. In mid February we will obtain a new CT scan of the chest abdomen and pelvis for reassessment. I will see the patient back shortly after the CT scan to review all results. Hopefully this can wait negative and we will discontinue of therapy and put the patient and observation program.   Jeannette Corpus, MD 01/16/2011, 1:50 PM                         Consult Note: Gyn-Onc   Krystal [name] 41 y.o. female  Chief Complaint  Patient presents with  . Endometrial cancer    Follow-up    Interval History:   HPI:  No Known Allergies  Past Medical History  Diagnosis Date  . Hypertension     SINCE FIRST PREGNANCY  . Asthma   . Constipation   . History of miscarriage   . Preeclampsia   . Uterine cancer   . Postoperative ileus 07/2010  . Iron deficiency anemia     s/p transfusions [facility]    Past Surgical History  Procedure Date  . Cesarean section   . Abdominal hysterectomy 07-08-10     TAH-BSO, WITH OMENTECTOMY AND BILAT. PELVIC AND PERIAORTIC LYMPH NODE DISSECTION  .  Appendectomy     IN CHILDHOOD    Current Outpatient Prescriptions  Medication Sig Dispense Refill  . amLODipine (NORVASC) 10 MG tablet Take 10 mg by mouth daily.        . cetirizine (ZYRTEC) 10 MG tablet Take 10 mg by mouth daily as needed. For allergies      . dexamethasone (DECADRON) 4 MG tablet Take 4 mg by mouth as directed. Take  5 tabs =20mg  with food 12 hrs. And 6 hrs. Before taxol chemotherapy.       . docusate sodium (COLACE) 100 MG capsule Take 100 mg by mouth 2 (two) times daily as needed. For constipation      . glycerin adult (GLYCERIN ADULT) 2 G SUPP Place 1 suppository rectally once as needed. For constipation      . ibuprofen (ADVIL,MOTRIN) 800 MG tablet Take 800 mg by mouth every 8 (eight) hours as needed. pain       . lidocaine-prilocaine (EMLA) cream Apply 1 application topically as needed.        Marland Kitchen LORazepam (ATIVAN) 1 MG tablet Take 0.5-1 mg by mouth every 6 (six) hours as needed. Take 1/2 to 1 tab orally or under the tongue for nausea or to relax      . Multiple Vitamin (MULTIVITAMIN) tablet Take 1 tablet by mouth daily. Take 5 tabs = 20 mg with food 12 hrs. And 6 hrs. Before taxol chemotherapy       . ondansetron (ZOFRAN) 8 MG tablet May take 1-2 tabs as needed for nausea  30 tablet  1  . oxyCODONE-acetaminophen (PERCOCET) 5-325 MG per tablet Take 1 tablet by mouth every 4 (four) hours as needed. pain       . promethazine (PHENERGAN) 25 MG tablet Take 25 mg by mouth every 6 (six) hours as needed. For nausea       No current facility-administered medications for this visit.   Facility-Administered Medications Ordered in Other Visits  Medication Dose Route Frequency Provider Last Rate Last Dose  . filgrastim (NEUPOGEN) injection 300 mcg  300 mcg Subcutaneous Once [name], MD   300 mcg at 01/15/11 1627    History   Social History  . Marital Status: Single    Spouse Name: N/A    Number of Children: N/A  . Years of Education: N/A   Occupational History  .  Not on file.   Social History Main Topics  . Smoking status: Never Smoker   . Smokeless tobacco: Not on file  . Alcohol Use: No  . Drug Use: No  . Sexually Active: Not Currently     due to surgery   Other Topics Concern  . Not on file   Social History Narrative  . No narrative on file    Family History  Problem Relation Age of Onset  . Diabetes Mother     Review of Systems:  Vitals: Blood pressure 118/60, pulse 72, temperature 99.3 F (37.4 C), temperature source Oral, resp. rate 16, height 5\' 6"  (1.676 m), weight 200 lb 12.8 oz (91.082 kg), last menstrual period 07/02/2010.  Physical Exam:  Assessment/Plan:   Jeannette Corpus, MD 01/16/2011, 1:50 PM

## 2011-01-16 NOTE — Patient Instructions (Signed)
Schedule CT scan for mid February and to see Dr. Stanford Breed shortly thereafter.

## 2011-01-19 ENCOUNTER — Telehealth: Payer: Self-pay

## 2011-01-19 ENCOUNTER — Other Ambulatory Visit: Payer: Self-pay

## 2011-01-19 ENCOUNTER — Ambulatory Visit (HOSPITAL_BASED_OUTPATIENT_CLINIC_OR_DEPARTMENT_OTHER): Payer: Self-pay | Admitting: Oncology

## 2011-01-19 DIAGNOSIS — C569 Malignant neoplasm of unspecified ovary: Secondary | ICD-10-CM

## 2011-01-19 DIAGNOSIS — C549 Malignant neoplasm of corpus uteri, unspecified: Secondary | ICD-10-CM

## 2011-01-19 DIAGNOSIS — C541 Malignant neoplasm of endometrium: Secondary | ICD-10-CM

## 2011-01-19 LAB — CBC WITH DIFFERENTIAL/PLATELET
BASO%: 3.2 % — ABNORMAL HIGH (ref 0.0–2.0)
MCHC: 30.8 g/dL — ABNORMAL LOW (ref 31.5–36.0)
MONO#: 0.9 10*3/uL (ref 0.1–0.9)
RBC: 3.4 10*6/uL — ABNORMAL LOW (ref 3.70–5.45)
WBC: 4 10*3/uL (ref 3.9–10.3)
lymph#: 0.7 10*3/uL — ABNORMAL LOW (ref 0.9–3.3)
nRBC: 1 % — ABNORMAL HIGH (ref 0–0)

## 2011-01-19 MED ORDER — IBUPROFEN 800 MG PO TABS: 800.0000 mg | ORAL_TABLET | Freq: Three times a day (TID) | ORAL | Status: DC | PRN

## 2011-01-19 NOTE — Telephone Encounter (Signed)
PT. NOTIFIED THAT IBUPROFEN PRESCRIPTION WAS REFILLED.

## 2011-01-19 NOTE — Telephone Encounter (Signed)
[name] CALLED STATING THAT SHE HAS BEEN EXPERIENCING A LOT OF LOWER BACK AND PERINEAL PAIN THE LAST SEVERAL DAYS.  PT.'S TEMP 100.5 TODAY HAS BEEN 100.6 MAX.  NO SHAKING CHILLS. HER YOUNGEST CHILD IS ON ATB FOR COLD. PT. TREATED 01-07-11.  RECEIVED NEUPOGEN 300 MCG 1-2;3;4.  SHE WILL COME IN THIS AFTERNOON FOR A CBC AND WAIT FOR THE RESULTS. PT. OUT OF IBUPROFEN 800MG .  WILL CALL REFILL TO [facility].

## 2011-01-19 NOTE — Progress Notes (Signed)
PT.'S WBC GOOD.  TEMP. 98.0  CALLED IN IBUPROFEN 800 MG TABS FOR PATIENT.  KEEP APPT. 01-26-11 AS SCHEDULED.  SHE IS TO CALL IF TEP IS 101 OR HIGHER.  HER YOUNGEST CHILD IS CURRENTLY ON ATB FOR INFECTION.

## 2011-01-23 ENCOUNTER — Encounter: Payer: Self-pay | Admitting: Oncology

## 2011-01-23 NOTE — Progress Notes (Signed)
Patient received one prescription from [place] [facility] pharmacy on 01/22/11 $3.00,her remaning balance [facility] $142.97

## 2011-01-26 ENCOUNTER — Telehealth: Payer: Self-pay | Admitting: Oncology

## 2011-01-26 ENCOUNTER — Ambulatory Visit (HOSPITAL_BASED_OUTPATIENT_CLINIC_OR_DEPARTMENT_OTHER): Payer: Self-pay | Admitting: Oncology

## 2011-01-26 ENCOUNTER — Ambulatory Visit
Admission: RE | Admit: 2011-01-26 | Discharge: 2011-01-26 | Disposition: A | Discharge status: Home / Self Care | Payer: Self-pay | Source: Ambulatory Visit | Attending: Radiation Oncology | Admitting: Radiation Oncology

## 2011-01-26 ENCOUNTER — Other Ambulatory Visit: Payer: Self-pay | Admitting: Lab

## 2011-01-26 VITALS — BP 126/78 | HR 91 | Ht 67.0 in | Wt 204.5 lb

## 2011-01-26 DIAGNOSIS — C541 Malignant neoplasm of endometrium: Secondary | ICD-10-CM

## 2011-01-26 DIAGNOSIS — C549 Malignant neoplasm of corpus uteri, unspecified: Secondary | ICD-10-CM

## 2011-01-26 LAB — COMPREHENSIVE METABOLIC PANEL
ALT: 20 U/L (ref 0–35)
CO2: 27 mEq/L (ref 19–32)
Calcium: 9.2 mg/dL (ref 8.4–10.5)
Chloride: 104 mEq/L (ref 96–112)
Creatinine, Ser: 0.66 mg/dL (ref 0.50–1.10)

## 2011-01-26 LAB — CBC WITH DIFFERENTIAL/PLATELET
BASO%: 0 % (ref 0.0–2.0)
Basophils Absolute: 0 10*3/uL (ref 0.0–0.1)
HCT: 27.7 % — ABNORMAL LOW (ref 34.8–46.6)
HGB: 9.1 g/dL — ABNORMAL LOW (ref 11.6–15.9)
MCHC: 32.9 g/dL (ref 31.5–36.0)
MONO#: 0.2 10*3/uL (ref 0.1–0.9)
NEUT#: 2.4 10*3/uL (ref 1.5–6.5)
NEUT%: 74.2 % (ref 38.4–76.8)
WBC: 3.2 10*3/uL — ABNORMAL LOW (ref 3.9–10.3)
lymph#: 0.6 10*3/uL — ABNORMAL LOW (ref 0.9–3.3)

## 2011-01-26 NOTE — Progress Notes (Signed)
Encounter addended by: Tessa Lerner, RN on: 01/26/2011  9:22 AM<BR>     Documentation filed: Vitals Section

## 2011-01-26 NOTE — Progress Notes (Signed)
Given size medium dilator and instructions on use at least three times weekly to prevent closing of vaginal orifice.

## 2011-01-26 NOTE — Telephone Encounter (Signed)
INJ MADE FOR 1/23-1/25,AND LL APPT,LAB FLUSH MADE FOR 2/20,PRINTED FOR PT  AOM

## 2011-01-26 NOTE — Progress Notes (Signed)
Here for follow up post HDR radiation for endometrial ca. Pt. Scheduled for last chemo this week 1/16/1`2. Denies nausea.  Has some fatigue more r/t to being up with 41year old. Has some pain in lower extremities on first standing but resolves on ambulation.

## 2011-01-26 NOTE — Progress Notes (Signed)
Encounter addended by: Tessa Lerner, RN on: 01/26/2011  5:23 PM<BR>     Documentation filed: Notes Section

## 2011-01-26 NOTE — Progress Notes (Signed)
CC:   [name], M.D. [name], M.D. [name], MD  DIAGNOSIS:  Stage IIIC high-grade serous endometrial adenocarcinoma.  INTERVAL SINCE RADIATION THERAPY:  6 weeks.  NARRATIVE:  Mrs. Dicarlo comes in today for routine followup.  She completed external beam and intracavitary brachytherapy as part of the management of her advanced endometrial cancer.  The patient continues on chemotherapy through Dr. Precious Reel office.  According the patient she has 1 more treatment remaining.  The patient denies any vaginal bleeding or urination difficulties.  She denies any rectal bleeding.  The patient has had some constipation.  The patient did see Dr. Stanford Breed recently and the patient may have an incisional hernia.  Plans are for this to be tentatively corrected after the patient completes her chemotherapy.  PHYSICAL EXAMINATION:  Temperature is 98.6, pulse is 93, blood pressure is 105/69.  Weight is 204.6 pounds.  Examination of the neck and supraclavicular region reveals no evidence of adenopathy.  Axillary areas are free of adenopathy.  Examination of the lungs reveals them to be clear.  The heart has a regular rhythm and rate.  The abdomen is soft and nontender with normal bowel sounds.  The patient does have an easily reducible hernia in the upper aspect of her abdominal incision.  Pap smear and pelvic exam are not performed in light of the patient's recent completion of treatment.  IMPRESSION AND PLAN:  The patient is doing reasonably well at this time except for persistent fatigue related to her radiation and ongoing chemotherapy.  The patient will see Dr. Stanford Breed in February for a pelvic exam and Pap smear.  The patient returns for followup in Radiation Oncology in May of this year.  The patient was given a vaginal dilator and instructions on its use today.    ______________________________ Billie Lade, Ph.D., M.D. JDK/MEDQ  D:  01/26/2011  T:   01/26/2011  Job:  [id number]

## 2011-01-27 ENCOUNTER — Ambulatory Visit: Payer: Self-pay

## 2011-01-27 NOTE — Progress Notes (Signed)
OFFICE PROGRESS NOTE Date of Visit Jan 26, 2011 Physicians:  D.ClarkePearson, J.Kinard  INTERVAL HISTORY:   Patient is seen, alone for visit today, in continuing attention to her IIIC high grade endometrial carcinoma, due cycle 6 Taxol/carboplatin on 01-28-11. She has additionallly had surgery at diagnosis June 2012 by Dr.ClarkePearson (involvement into outer half of myometrium, lower uterine segment and cervix and multiple nodes positive) and RT by Dr.Kinard given after first 3 cycles of chemotherapy (external beam and HDR x 3). She saw Dr.ClarkePearson on Jan 4 and is scheduled back to him Feb 11; she needs repeat scans prior to that visit. Patient is still experiencing fatigue, but otherwise continues to tolerate treatment well. She has had no further fever or symptoms of infection. Bowels are moving regularly now back on miralax. She is tolerating taxol aches and does not have significant peripheral neuropathy symptoms. The abdominal hernia is not changed and light support garments there are helpful. She denies increased shortness of breath, is eating, no bleeding, no voiding symptoms, is sleeping at night. She does not think she will be able to tolerate standing x 7 hrs at work by February and I have written an extension on the out of work note. Review of Systems: otherwise full 10 point ROS negative.  Objective:  Vital signs in last 24 hours:  BP 126/78  Pulse 91  Ht 5\' 7"  (1.702 m)  Wt 204 lb 8 oz (92.761 kg)  BMI 32.03 kg/m2  LMP 07/02/2010  Alert, NAD, easily ambulatory, respirations not labored on RA.  HEENT:mucous membranes moist, pharynx normal without lesions. Complete alopecia. PERRL, not icteric  LymphaticsCervical, supraclavicular, and axillary nodes normal. Resp: clear to auscultation bilaterally and normal percussion bilaterally Cardio: regular rate and rhythm GI: soft, nontender, not obviously distended. Ventral hernia easily reducible, nontender. No appreciable HSM or  masses. No inguinal adenopathy. Extremities: extremities normal, atraumatic, no cyanosis or edema Skin without rash or ecchymoses Portacath-without erythema  Lab Results:   Basename 01/26/11 1003  WBC 3.2*  HGB 9.1*  HCT 27.7*  PLT 225  ANC 2.4 MCV 83  BMET/CMET  Basename 01/26/11 1003  NA 142  K 3.9  CL 104  CO2 27  GLUCOSE 101*  BUN 14  CREATININE 0.66  CALCIUM 9.2   Remainder of full CMET unremarkable Studies/Results:  No results found.  Medications: I have reviewed the patient's current medications.  Assessment/Plan:  1. IIIC high grade endometrial carcinoma in a 41 yo lady, post treatment as above and for 6th cycle of taxol/carboplatin 01-28-11. She will have neupogen x 3 doses beginning a week after chemo, then restaging scans and follow up appointment with gyn onc. I will see her at least 03-04-11 or sooner if needed. 2.PAC in 3.Chronic constipation preceeding cancer diagnosis 4.difficult social situation as she is not a [place] citizen, has no insurance and has 3 small children.  5.Ventral abdominal hernia, stable        Adriyana Greenbaum P, MD   01/27/2011, 9:22 PM

## 2011-01-28 ENCOUNTER — Other Ambulatory Visit: Payer: Self-pay | Admitting: Oncology

## 2011-01-28 ENCOUNTER — Ambulatory Visit (HOSPITAL_BASED_OUTPATIENT_CLINIC_OR_DEPARTMENT_OTHER): Payer: Self-pay

## 2011-01-28 VITALS — BP 122/76 | HR 86 | Temp 98.1°F

## 2011-01-28 DIAGNOSIS — C541 Malignant neoplasm of endometrium: Secondary | ICD-10-CM

## 2011-01-28 DIAGNOSIS — Z5111 Encounter for antineoplastic chemotherapy: Secondary | ICD-10-CM

## 2011-01-28 DIAGNOSIS — C549 Malignant neoplasm of corpus uteri, unspecified: Secondary | ICD-10-CM

## 2011-01-28 MED ORDER — SODIUM CHLORIDE 0.9 % IJ SOLN
10.0000 mL | INTRAMUSCULAR | Status: DC | PRN
  Administered 2011-01-28: 10 mL
  Filled 2011-01-28: qty 10

## 2011-01-28 MED ORDER — FAMOTIDINE IN NACL 20-0.9 MG/50ML-% IV SOLN
20.0000 mg | Freq: Once | INTRAVENOUS | Status: AC
  Administered 2011-01-28: 20 mg via INTRAVENOUS

## 2011-01-28 MED ORDER — ONDANSETRON 16 MG/50ML IVPB (CHCC)
16.0000 mg | Freq: Once | INTRAVENOUS | Status: AC
  Administered 2011-01-28: 16 mg via INTRAVENOUS

## 2011-01-28 MED ORDER — DIPHENHYDRAMINE HCL 50 MG/ML IJ SOLN
50.0000 mg | Freq: Once | INTRAMUSCULAR | Status: AC
  Administered 2011-01-28: 50 mg via INTRAVENOUS

## 2011-01-28 MED ORDER — DEXAMETHASONE SODIUM PHOSPHATE 4 MG/ML IJ SOLN
20.0000 mg | Freq: Once | INTRAMUSCULAR | Status: AC
  Administered 2011-01-28: 20 mg via INTRAVENOUS

## 2011-01-28 MED ORDER — HEPARIN SOD (PORK) LOCK FLUSH 100 UNIT/ML IV SOLN
500.0000 [IU] | Freq: Once | INTRAVENOUS | Status: AC | PRN
  Administered 2011-01-28: 500 [IU]
  Filled 2011-01-28: qty 5

## 2011-01-28 MED ORDER — SODIUM CHLORIDE 0.9 % IV SOLN
Freq: Once | INTRAVENOUS | Status: AC
  Administered 2011-01-28: 13:00:00 via INTRAVENOUS

## 2011-01-28 MED ORDER — SODIUM CHLORIDE 0.9 % IV SOLN
727.0000 mg | Freq: Once | INTRAVENOUS | Status: AC
  Administered 2011-01-28: 730 mg via INTRAVENOUS
  Filled 2011-01-28: qty 73

## 2011-01-28 MED ORDER — PACLITAXEL CHEMO INJECTION 300 MG/50ML
175.0000 mg/m2 | Freq: Once | INTRAVENOUS | Status: AC
  Administered 2011-01-28: 360 mg via INTRAVENOUS
  Filled 2011-01-28: qty 60

## 2011-01-29 ENCOUNTER — Telehealth: Payer: Self-pay | Admitting: Oncology

## 2011-01-29 ENCOUNTER — Other Ambulatory Visit: Payer: Self-pay | Admitting: Oncology

## 2011-01-29 DIAGNOSIS — C55 Malignant neoplasm of uterus, part unspecified: Secondary | ICD-10-CM

## 2011-01-29 NOTE — Telephone Encounter (Signed)
Talked to pt, gave her appt for injection, CT, MD and lab. Pt will come to pick up oral contrast before CT. Pt is aware to be NPO 4 hrs prior to scan

## 2011-02-02 ENCOUNTER — Telehealth: Payer: Self-pay

## 2011-02-02 NOTE — Telephone Encounter (Signed)
SPOKE WITH MS. [name] AND TOLD HER TO BE SURE TO P/U THE CONTRAST FOR HER CT SCAN  WHEN  SHE COME FOR INJECTIONS LATER THIS WEEK.  PT. VERBALIZED UNDERSTANDING.

## 2011-02-04 ENCOUNTER — Ambulatory Visit (HOSPITAL_BASED_OUTPATIENT_CLINIC_OR_DEPARTMENT_OTHER): Payer: Self-pay

## 2011-02-04 VITALS — BP 137/92 | HR 92 | Temp 98.6°F

## 2011-02-04 DIAGNOSIS — C549 Malignant neoplasm of corpus uteri, unspecified: Secondary | ICD-10-CM

## 2011-02-04 DIAGNOSIS — C541 Malignant neoplasm of endometrium: Secondary | ICD-10-CM

## 2011-02-04 MED ORDER — FILGRASTIM 300 MCG/0.5ML IJ SOLN
300.0000 ug | Freq: Once | INTRAMUSCULAR | Status: AC
  Administered 2011-02-04: 300 ug via SUBCUTANEOUS

## 2011-02-05 ENCOUNTER — Ambulatory Visit (HOSPITAL_BASED_OUTPATIENT_CLINIC_OR_DEPARTMENT_OTHER): Payer: Self-pay

## 2011-02-05 VITALS — BP 123/74 | HR 101 | Temp 98.7°F

## 2011-02-05 DIAGNOSIS — Z5189 Encounter for other specified aftercare: Secondary | ICD-10-CM

## 2011-02-05 DIAGNOSIS — C549 Malignant neoplasm of corpus uteri, unspecified: Secondary | ICD-10-CM

## 2011-02-05 DIAGNOSIS — C541 Malignant neoplasm of endometrium: Secondary | ICD-10-CM

## 2011-02-05 MED ORDER — FILGRASTIM 300 MCG/0.5ML IJ SOLN
300.0000 ug | Freq: Once | INTRAMUSCULAR | Status: AC
  Administered 2011-02-05: 300 ug via SUBCUTANEOUS
  Filled 2011-02-05: qty 0.5

## 2011-02-06 ENCOUNTER — Ambulatory Visit (HOSPITAL_BASED_OUTPATIENT_CLINIC_OR_DEPARTMENT_OTHER): Payer: Self-pay

## 2011-02-06 VITALS — BP 129/79 | HR 103 | Temp 100.4°F

## 2011-02-06 DIAGNOSIS — Z5189 Encounter for other specified aftercare: Secondary | ICD-10-CM

## 2011-02-06 DIAGNOSIS — C541 Malignant neoplasm of endometrium: Secondary | ICD-10-CM

## 2011-02-06 DIAGNOSIS — C549 Malignant neoplasm of corpus uteri, unspecified: Secondary | ICD-10-CM

## 2011-02-06 MED ORDER — FILGRASTIM 300 MCG/0.5ML IJ SOLN
300.0000 ug | Freq: Once | INTRAMUSCULAR | Status: AC
  Administered 2011-02-06: 300 ug via SUBCUTANEOUS
  Filled 2011-02-06: qty 0.5

## 2011-02-06 NOTE — Progress Notes (Signed)
Patient here for last Neupogen injection,  Temp 100.4(oral)   Rechecked after 5 minutes 100.2(oral). States she is feeling fine, "just too many clothes to keep warm"    Called Dr Precious Reel nurse Sallye Ober and we have told her to watch her temperature and take Tylenol.   Pt knows to call us if her temp goes up to 101.0.

## 2011-02-17 ENCOUNTER — Other Ambulatory Visit (HOSPITAL_COMMUNITY): Payer: Self-pay

## 2011-02-18 ENCOUNTER — Ambulatory Visit (HOSPITAL_COMMUNITY)
Admission: RE | Admit: 2011-02-18 | Discharge: 2011-02-18 | Disposition: A | Discharge status: Home / Self Care | Payer: Self-pay | Source: Ambulatory Visit | Attending: Oncology | Admitting: Oncology

## 2011-02-18 DIAGNOSIS — C541 Malignant neoplasm of endometrium: Secondary | ICD-10-CM

## 2011-02-18 DIAGNOSIS — R918 Other nonspecific abnormal finding of lung field: Secondary | ICD-10-CM | POA: Insufficient documentation

## 2011-02-18 DIAGNOSIS — C549 Malignant neoplasm of corpus uteri, unspecified: Secondary | ICD-10-CM | POA: Insufficient documentation

## 2011-02-18 DIAGNOSIS — Z9079 Acquired absence of other genital organ(s): Secondary | ICD-10-CM | POA: Insufficient documentation

## 2011-02-18 DIAGNOSIS — Z923 Personal history of irradiation: Secondary | ICD-10-CM | POA: Insufficient documentation

## 2011-02-18 DIAGNOSIS — Z90710 Acquired absence of both cervix and uterus: Secondary | ICD-10-CM | POA: Insufficient documentation

## 2011-02-18 DIAGNOSIS — Z9221 Personal history of antineoplastic chemotherapy: Secondary | ICD-10-CM | POA: Insufficient documentation

## 2011-02-18 DIAGNOSIS — K439 Ventral hernia without obstruction or gangrene: Secondary | ICD-10-CM | POA: Insufficient documentation

## 2011-02-18 DIAGNOSIS — K7689 Other specified diseases of liver: Secondary | ICD-10-CM | POA: Insufficient documentation

## 2011-02-18 MED ORDER — IOHEXOL 300 MG/ML  SOLN
100.0000 mL | Freq: Once | INTRAMUSCULAR | Status: AC | PRN
  Administered 2011-02-18: 100 mL via INTRAVENOUS

## 2011-02-23 ENCOUNTER — Encounter: Payer: Self-pay | Admitting: Gynecology

## 2011-02-23 ENCOUNTER — Ambulatory Visit: Payer: Self-pay | Attending: Gynecology | Admitting: Gynecology

## 2011-02-23 VITALS — BP 124/70 | HR 80 | Temp 99.3°F | Resp 20 | Ht 66.0 in | Wt 205.0 lb

## 2011-02-23 DIAGNOSIS — G609 Hereditary and idiopathic neuropathy, unspecified: Secondary | ICD-10-CM | POA: Insufficient documentation

## 2011-02-23 DIAGNOSIS — C541 Malignant neoplasm of endometrium: Secondary | ICD-10-CM

## 2011-02-23 DIAGNOSIS — Z90710 Acquired absence of both cervix and uterus: Secondary | ICD-10-CM | POA: Insufficient documentation

## 2011-02-23 DIAGNOSIS — I10 Essential (primary) hypertension: Secondary | ICD-10-CM | POA: Insufficient documentation

## 2011-02-23 DIAGNOSIS — C549 Malignant neoplasm of corpus uteri, unspecified: Secondary | ICD-10-CM | POA: Insufficient documentation

## 2011-02-23 DIAGNOSIS — Z79899 Other long term (current) drug therapy: Secondary | ICD-10-CM | POA: Insufficient documentation

## 2011-02-23 NOTE — Patient Instructions (Signed)
Return in 3 months. You may return for levels of activity. We will obtain a CT scan prior to her next visit. If you develop pain or problems with the hernia please contact us

## 2011-02-23 NOTE — Progress Notes (Signed)
Consult Note: Gyn-Onc   Krystal [name] 41 y.o. female  Chief Complaint  Patient presents with  . Endometrial cancer    follow up    Interval History: The patient returns today for followup. She had a CT scan on 02/18/2011 for re\re staging. The CT scan appears to be normal with the exception of a 6 mm nodule in the left lower lobe of the lung.  Overall the patient has increased her energy level is still feels somewhat fatigued. 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 Otherwise she has no GI or GU symptoms denies any pelvic pain pressure vaginal bleeding or discharge.  HPI::The patient underwent a total abdominal  hysterectomy, bilateral salpingo-oophorectomy, pelvic and periaortic  lymphadenectomy, and omentectomy on July 08, 2010. 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 Sandwich  Chemotherapy with carboplatin and taxol  and external radiation therapy was given postop.   No Known Allergies  Past Medical History  Diagnosis Date  . Hypertension     SINCE FIRST PREGNANCY  . Asthma   . Constipation   . History of miscarriage   . Preeclampsia   . Uterine cancer   . Postoperative ileus 07/2010  . Iron deficiency anemia     s/p transfusions [facility]    Past Surgical History  Procedure Date  . Cesarean section   . Abdominal hysterectomy 07-08-10     TAH-BSO, WITH OMENTECTOMY AND BILAT. PELVIC AND PERIAORTIC LYMPH NODE DISSECTION  . Appendectomy     IN CHILDHOOD    Current Outpatient Prescriptions  Medication Sig Dispense Refill  . amLODipine (NORVASC) 10 MG tablet Take 10 mg by mouth daily.        . cetirizine (ZYRTEC) 10 MG tablet Take 10 mg by mouth daily as needed. For allergies      . dexamethasone (DECADRON) 4 MG tablet  Take 4 mg by mouth as directed. Take 5 tabs =20mg  with food 12 hrs. And 6 hrs. Before taxol chemotherapy.       . docusate sodium (COLACE) 100 MG capsule Take 100 mg by mouth 2 (two) times daily as needed. For constipation      . glycerin adult (GLYCERIN ADULT) 2 G SUPP Place 1 suppository rectally once as needed. For constipation      . ibuprofen (ADVIL,MOTRIN) 800 MG tablet Take 1 tablet (800 mg total) by mouth every 8 (eight) hours as needed. pain  30 tablet  2  . lidocaine-prilocaine (EMLA) cream Apply 1 application topically as needed.        Marland Kitchen LORazepam (ATIVAN) 1 MG tablet Take 0.5-1 mg by mouth every 6 (six) hours as needed. Take 1/2 to 1 tab orally or under the tongue for nausea or to relax      . Multiple Vitamin (MULTIVITAMIN) tablet Take 1 tablet by mouth daily. Take 5 tabs = 20 mg with food 12 hrs. And 6 hrs. Before taxol chemotherapy       . ondansetron (ZOFRAN) 8 MG tablet May take 1-2 tabs as needed for nausea  30 tablet  1  . oxyCODONE-acetaminophen (PERCOCET) 5-325 MG per tablet Take 1 tablet by mouth every 4 (four) hours as needed. pain       . polyethylene glycol (MIRALAX /  GLYCOLAX) packet Take 17 g by mouth 2 (two) times daily as needed.      . promethazine (PHENERGAN) 25 MG tablet Take 25 mg by mouth every 6 (six) hours as needed. For nausea        History   Social History  . Marital Status: Single    Spouse Name: N/A    Number of Children: N/A  . Years of Education: N/A   Occupational History  . Not on file.   Social History Main Topics  . Smoking status: Never Smoker   . Smokeless tobacco: Not on file  . Alcohol Use: No  . Drug Use: No  . Sexually Active: Not Currently     due to surgery   Other Topics Concern  . Not on file   Social History Narrative  . No narrative on file    Family History  Problem Relation Age of Onset  . Diabetes Mother     Review of Systems: 10 point review of systems negative except as noted above  Vitals: Blood pressure  124/70, pulse 80, temperature 99.3 F (37.4 C), temperature source Oral, resp. rate 20, height 5\' 6"  (1.676 m), weight 205 lb (92.987 kg), last menstrual period 07/02/2010.  Physical Exam: In general this is a healthy female no acute distress  HEENT is negative neck is supple without thyromegaly  There is no supraventricular or inguinal adenopathy.  The abdomen is soft and nontender she does have a large ventral hernia. This is easily reduced and not painful.  Pelvic exam EGBUS vagina bladder urethra are normal  Vaginal cuff is well healed well supported and no lesions are noted.  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 Patient is clinically free of disease  She's given the okay to return for levels of activity  She return in 3 months for reevaluation and repeat CT scan will be obtained just prior to that visit with particular attention to the left lower lobe pulmonary nodule.  Jeannette Corpus, MD 02/23/2011, 2:40 PM

## 2011-03-04 ENCOUNTER — Ambulatory Visit: Payer: Self-pay

## 2011-03-04 ENCOUNTER — Ambulatory Visit (HOSPITAL_BASED_OUTPATIENT_CLINIC_OR_DEPARTMENT_OTHER): Payer: Self-pay | Admitting: Oncology

## 2011-03-04 ENCOUNTER — Telehealth: Payer: Self-pay | Admitting: Oncology

## 2011-03-04 ENCOUNTER — Other Ambulatory Visit: Payer: Self-pay | Admitting: Lab

## 2011-03-04 VITALS — Ht 66.0 in | Wt 200.1 lb

## 2011-03-04 DIAGNOSIS — C549 Malignant neoplasm of corpus uteri, unspecified: Secondary | ICD-10-CM

## 2011-03-04 DIAGNOSIS — C541 Malignant neoplasm of endometrium: Secondary | ICD-10-CM

## 2011-03-04 DIAGNOSIS — K5900 Constipation, unspecified: Secondary | ICD-10-CM

## 2011-03-04 DIAGNOSIS — K439 Ventral hernia without obstruction or gangrene: Secondary | ICD-10-CM

## 2011-03-04 LAB — COMPREHENSIVE METABOLIC PANEL
AST: 19 U/L (ref 0–37)
Albumin: 4.1 g/dL (ref 3.5–5.2)
Alkaline Phosphatase: 105 U/L (ref 39–117)
Potassium: 3.7 mEq/L (ref 3.5–5.3)
Sodium: 142 mEq/L (ref 135–145)
Total Protein: 6.9 g/dL (ref 6.0–8.3)

## 2011-03-04 LAB — CBC WITH DIFFERENTIAL/PLATELET
BASO%: 0.3 % (ref 0.0–2.0)
EOS%: 1.3 % (ref 0.0–7.0)
HGB: 9.3 g/dL — ABNORMAL LOW (ref 11.6–15.9)
MCH: 26.1 pg (ref 25.1–34.0)
MCHC: 31 g/dL — ABNORMAL LOW (ref 31.5–36.0)
RDW: 17.4 % — ABNORMAL HIGH (ref 11.2–14.5)
lymph#: 1.1 10*3/uL (ref 0.9–3.3)
nRBC: 0 % (ref 0–0)

## 2011-03-04 MED ORDER — SODIUM CHLORIDE 0.9 % IJ SOLN
10.0000 mL | INTRAMUSCULAR | Status: DC | PRN
  Administered 2011-03-04: 10 mL via INTRAVENOUS
  Filled 2011-03-04: qty 10

## 2011-03-04 MED ORDER — HEPARIN SOD (PORK) LOCK FLUSH 100 UNIT/ML IV SOLN
500.0000 [IU] | Freq: Once | INTRAVENOUS | Status: AC
  Administered 2011-03-04: 500 [IU] via INTRAVENOUS
  Filled 2011-03-04: qty 5

## 2011-03-04 NOTE — Progress Notes (Signed)
OFFICE PROGRESS NOTE Date of VIsit 03-04-2011 Physicians: D.ClarkePearson, J.Kinard  INTERVAL HISTORY:  Patient is seen, alone for visit, in continuing attention to her history of IIIC high grade endometrial carcinoma. She has been treated with surgery at diagnosis June 2012 by Dr.ClarkePearson, with 9/34 nodes involved as well as deep myometrial, cervical and adnexal involvement. She had total 6 cycles of taxol/carboplatin completed 01-28-2011, with external beam radiation + HDR given after first 3 chemotherapy treatments. She has PAC still in. She had repeat CT abdomen/pelvis done in the [facility] 02-18-2011, with an enlarging 6 mm nodule LLL lung, small fluid, no adenopathy or soft tissue mass, ventral hernia, no other obvious disease. She saw Dr.ClarkePearson 03-02-2011, his exam not remarkable. She is to have repeat CT CAP (set up for 05-21-11) and will see Dr.ClarkePearson again 05-25-11.   Patient has felt progressively better since completing treatment, tho she has persistent neuropathy symptoms in feet. We have discussed good shoe choices and she understands that this may still improve. She has had no recent infectious illnesses, no bleeding, no abdominal or pelvic pain and fatigue is some improved. She has had no problem with the PAC, this flushed today. She continues miralax daily which keeps bowels moving daily (has history of severe constipation since childhood). The ventral hernia is not painful, but is bothersome to her. We will ask general surgery to meet her and discuss repair. Remainder of 10 point review of systems negative/ unchanged.   Objective:  Vital signs in last 24 hours:  Ht 5\' 6"  (1.676 m)  Wt 200 lb 1.6 oz (90.765 kg)  BMI 32.30 kg/m2  LMP 07/02/2010 Alert, easily mobile, NAD. Still alopecia.   HEENT:mucous membranes moist, pharynx normal without lesions. PERRL.  LymphaticsCervical, supraclavicular, and axillary nodes normal.No inguinal adenopathy Resp: clear to  auscultation bilaterally and normal percussion bilaterally Cardio: regular rate and rhythm GI:  Large ventral hernia easily reducible, otherwise soft and nontender without HSM or mass, some bowel sounds, not distended Extremities: extremities normal, atraumatic, no cyanosis or edema Neuro:decreased light touch feet Portacath-without erythema or tenderness  Lab Results:   Basename 03/04/11 1004  WBC 3.8*  HGB 9.3*  HCT 30.0*  PLT 279  ANC 2.4 MCV 84  BMET  Basename 03/04/11 1004  NA 142  K 3.7  CL 107  CO2 25  GLUCOSE 100*  BUN 12  CREATININE 0.72  CALCIUM 9.4   Remainder of full CMET WNL Studies/Results:  CT AP 02-18-11 Medications: I have reviewed the patient's current medications.  Assessment/Plan: 1.IIIC high grade endometrial carcinoma in 41 yo lady, post treatment as above and with close follow up planned as she is certainly as high risk for recurrence. Will keep PAC in and flush every 8 wks when not used otherwise (~ 4-17, 6-12 and 8-7). I will see her back with flush 8-7 or sooner if needed. We will recheck CBC/ CMET from PAC 4-17 and 8-7.  2.Ventral abdominal hernia: patient would like to discuss repair with general surgery 3.PAC in 4.Chronic constipation much better on miralax 5.Difficult social situation as she is not [place] resident/ no insurance/ 3 small children. Her mother plans to visit soon.        [name], MD   03/04/2011, 6:24 PM

## 2011-03-04 NOTE — Telephone Encounter (Signed)
Gv pt appt for april-aug2013.  scheduled appt with Dr. Dwain Sarna for 03/07 @ 11am @ [facility]

## 2011-03-06 NOTE — Telephone Encounter (Signed)
xxx

## 2011-03-06 NOTE — Telephone Encounter (Signed)
XXXX 

## 2011-03-07 ENCOUNTER — Encounter: Payer: Self-pay | Admitting: Oncology

## 2011-03-19 ENCOUNTER — Encounter (INDEPENDENT_AMBULATORY_CARE_PROVIDER_SITE_OTHER): Payer: Self-pay | Admitting: General Surgery

## 2011-03-19 ENCOUNTER — Ambulatory Visit (INDEPENDENT_AMBULATORY_CARE_PROVIDER_SITE_OTHER): Payer: Self-pay | Admitting: General Surgery

## 2011-03-19 VITALS — BP 124/88 | HR 88 | Temp 97.4°F | Resp 18 | Ht 66.0 in | Wt 203.4 lb

## 2011-03-19 DIAGNOSIS — K432 Incisional hernia without obstruction or gangrene: Secondary | ICD-10-CM

## 2011-03-19 NOTE — Patient Instructions (Signed)
I will talk to Dr.Livesay and call you back

## 2011-03-19 NOTE — Progress Notes (Signed)
Patient ID: Krystal [name], female   DOB: December 23, 1970, 41 y.o.   MRN: [id number]  Chief Complaint  Patient presents with  . Hernia    new pt- eval; ventral hernia    HPI Krystal [name] is a 41 y.o. female.  Referred by Dr. Jama Flavors HPI 37 yof who underwent tah/bso with node dissection for uterine cancer followed by chemo/xrt.  Postoperatively she apparently had an ileus requiring ng placement.  She has complained of an increasing in size abdominal bulge for a number of months now.  This goes down when she is lying down but is mostly out when standing.  It is getting bigger and causing her some discomfort now.  She has some constipation but otherwise no changes in her bowel habits.  She underwent ct about a month ago which shows no evidence of recurrent disease and the presence of this hernia.  She states she is due another follow up ct in 2 months.  She comes in to discuss possible surgery for hernia.  Past Medical History  Diagnosis Date  . Hypertension     SINCE FIRST PREGNANCY  . Asthma   . Constipation   . History of miscarriage   . Preeclampsia   . Uterine cancer   . Postoperative ileus 07/2010  . Iron deficiency anemia     s/p transfusions [facility]    Past Surgical History  Procedure Date  . Cesarean section   . Abdominal hysterectomy 07-08-10     TAH-BSO, WITH OMENTECTOMY AND BILAT. PELVIC AND PERIAORTIC LYMPH NODE DISSECTION  . Appendectomy     IN CHILDHOOD    Family History  Problem Relation Age of Onset  . Diabetes Mother   . Hypertension Mother   . Hypertension Father     Social History History  Substance Use Topics  . Smoking status: Never Smoker   . Smokeless tobacco: Not on file  . Alcohol Use: Yes     occ.    No Known Allergies  Current Outpatient Prescriptions  Medication Sig Dispense Refill  . amLODipine (NORVASC) 10 MG tablet Take 10 mg by mouth daily.        . cetirizine (ZYRTEC) 10 MG tablet Take 10 mg by mouth daily as needed. For allergies       . dexamethasone (DECADRON) 4 MG tablet Take 4 mg by mouth as directed. Take 5 tabs =20mg  with food 12 hrs. And 6 hrs. Before taxol chemotherapy.       . docusate sodium (COLACE) 100 MG capsule Take 100 mg by mouth 2 (two) times daily as needed. For constipation      . glycerin adult (GLYCERIN ADULT) 2 G SUPP Place 1 suppository rectally once as needed. For constipation      . ibuprofen (ADVIL,MOTRIN) 800 MG tablet Take 1 tablet (800 mg total) by mouth every 8 (eight) hours as needed. pain  30 tablet  2  . lidocaine-prilocaine (EMLA) cream Apply 1 application topically as needed.        Marland Kitchen LORazepam (ATIVAN) 1 MG tablet Take 0.5-1 mg by mouth every 6 (six) hours as needed. Take 1/2 to 1 tab orally or under the tongue for nausea or to relax      . Multiple Vitamin (MULTIVITAMIN) tablet Take 1 tablet by mouth daily. Take 5 tabs = 20 mg with food 12 hrs. And 6 hrs. Before taxol chemotherapy       . ondansetron (ZOFRAN) 8 MG tablet May take 1-2 tabs as needed  for nausea  30 tablet  1  . oxyCODONE-acetaminophen (PERCOCET) 5-325 MG per tablet Take 1 tablet by mouth every 4 (four) hours as needed. pain       . polyethylene glycol (MIRALAX / GLYCOLAX) packet Take 17 g by mouth 2 (two) times daily as needed.      . promethazine (PHENERGAN) 25 MG tablet Take 25 mg by mouth every 6 (six) hours as needed. For nausea        Review of Systems Review of Systems  Constitutional: Negative for fever, chills and unexpected weight change.  HENT: Negative for hearing loss, congestion, sore throat, trouble swallowing and voice change.   Eyes: Negative for visual disturbance.  Respiratory: Positive for cough. Negative for wheezing.   Cardiovascular: Negative for chest pain, palpitations and leg swelling.  Gastrointestinal: Positive for constipation. Negative for nausea, vomiting, abdominal pain, diarrhea, blood in stool, abdominal distention and anal bleeding.  Genitourinary: Negative for hematuria, vaginal  bleeding and difficulty urinating.  Musculoskeletal: Negative for arthralgias.  Skin: Negative for rash and wound.  Neurological: Negative for seizures, syncope and headaches.  Hematological: Negative for adenopathy. Does not bruise/bleed easily.  Psychiatric/Behavioral: Negative for confusion.    Blood pressure 124/88, pulse 88, temperature 97.4 F (36.3 C), temperature source Temporal, resp. rate 18, height 5\' 6"  (1.676 m), weight 203 lb 6.4 oz (92.262 kg), last menstrual period 07/02/2010.  Physical Exam Physical Exam  Vitals reviewed. Constitutional: She appears well-developed and well-nourished.  Cardiovascular: Normal rate, regular rhythm and normal heart sounds.   Pulmonary/Chest: Effort normal. She has no wheezes. She has no rales.  Abdominal: Soft. There is no hepatomegaly. There is no tenderness. A hernia is present. Hernia confirmed positive in the ventral area.    Lymphadenopathy:    She has no cervical adenopathy.    Data Reviewed CT a/p reviewed and shown to patient  Assessment   Incisional hernia    Plan    She has a symptomatic hernia that needs to be repaired.  It think she is low risk for incarceration as this a a broad based defect but clearly there is colon involved with the hernia.  I will plan on discussing timing with her oncologist Dr. Darrold Span. We talked about laparoscopic vs open ventral hernia repair and the pros and cons of both of these.  I think either would be reasonable.  The laparoscopic repair will reduce risk of infection but wont necessarily get rid of the bulge there due to sac present and could have seroma at that site.  The open repair would make this flatter and give her a little more of a functional abdominal wall I think. I am going to wait to hear back from oncology then will discuss procedure and timing.    Kymberlee Viger 03/19/2011, 6:31 PM

## 2011-03-22 ENCOUNTER — Other Ambulatory Visit: Payer: Self-pay

## 2011-03-22 ENCOUNTER — Encounter (HOSPITAL_COMMUNITY): Payer: Self-pay | Admitting: *Deleted

## 2011-03-22 ENCOUNTER — Emergency Department (HOSPITAL_COMMUNITY): Payer: Self-pay

## 2011-03-22 ENCOUNTER — Emergency Department (HOSPITAL_COMMUNITY)
Admission: EM | Admit: 2011-03-22 | Discharge: 2011-03-23 | Disposition: A | Discharge status: Home / Self Care | Payer: Self-pay | Attending: Emergency Medicine | Admitting: Emergency Medicine

## 2011-03-22 DIAGNOSIS — Z8544 Personal history of malignant neoplasm of other female genital organs: Secondary | ICD-10-CM | POA: Insufficient documentation

## 2011-03-22 DIAGNOSIS — R05 Cough: Secondary | ICD-10-CM | POA: Insufficient documentation

## 2011-03-22 DIAGNOSIS — R911 Solitary pulmonary nodule: Secondary | ICD-10-CM | POA: Insufficient documentation

## 2011-03-22 DIAGNOSIS — R059 Cough, unspecified: Secondary | ICD-10-CM | POA: Insufficient documentation

## 2011-03-22 DIAGNOSIS — R079 Chest pain, unspecified: Secondary | ICD-10-CM | POA: Insufficient documentation

## 2011-03-22 LAB — COMPREHENSIVE METABOLIC PANEL
ALT: 22 U/L (ref 0–35)
Calcium: 9.7 mg/dL (ref 8.4–10.5)
Creatinine, Ser: 0.85 mg/dL (ref 0.50–1.10)
GFR calc Af Amer: >90 mL/min (ref >90)
Glucose, Bld: 103 mg/dL — ABNORMAL HIGH (ref 70–99)
Sodium: 140 mEq/L (ref 135–145)
Total Protein: 7.9 g/dL (ref 6.0–8.3)

## 2011-03-22 LAB — DIFFERENTIAL
Basophils Relative: 0 % (ref 0–1)
Eosinophils Absolute: 0.1 10*3/uL (ref 0.0–0.7)
Lymphs Abs: 1.1 10*3/uL (ref 0.7–4.0)
Monocytes Absolute: 0.5 10*3/uL (ref 0.1–1.0)
Neutrophils Relative %: 71 % (ref 43–77)

## 2011-03-22 LAB — CBC
Hemoglobin: 9.9 g/dL — ABNORMAL LOW (ref 12.0–15.0)
MCH: 27 pg (ref 26.0–34.0)
MCHC: 31.8 g/dL (ref 30.0–36.0)
Platelets: 191 10*3/uL (ref 150–400)

## 2011-03-22 LAB — URINALYSIS, ROUTINE W REFLEX MICROSCOPIC
Leukocytes, UA: NEGATIVE
Nitrite: NEGATIVE
Protein, ur: NEGATIVE mg/dL
Specific Gravity, Urine: 1.03 (ref 1.005–1.030)
Urobilinogen, UA: 0.2 mg/dL (ref 0.0–1.0)

## 2011-03-22 LAB — PROCALCITONIN: Procalcitonin: <0.1 ng/mL

## 2011-03-22 LAB — LACTIC ACID, PLASMA: Lactic Acid, Venous: 1.5 mmol/L (ref 0.5–2.2)

## 2011-03-22 MED ORDER — ACETAMINOPHEN 500 MG PO TABS
1000.0000 mg | ORAL_TABLET | Freq: Once | ORAL | Status: AC
  Administered 2011-03-22: 1000 mg via ORAL
  Filled 2011-03-22: qty 2

## 2011-03-22 MED ORDER — SODIUM CHLORIDE 0.9 % IV BOLUS (SEPSIS)
1000.0000 mL | Freq: Once | INTRAVENOUS | Status: AC
  Administered 2011-03-22: 1000 mL via INTRAVENOUS

## 2011-03-22 MED ORDER — ALBUTEROL SULFATE (5 MG/ML) 0.5% IN NEBU
2.5000 mg | INHALATION_SOLUTION | Freq: Once | RESPIRATORY_TRACT | Status: AC
  Administered 2011-03-22: 2.5 mg via RESPIRATORY_TRACT
  Filled 2011-03-22: qty 0.5

## 2011-03-22 NOTE — ED Notes (Signed)
Patient denies pain and is resting comfortably.  

## 2011-03-22 NOTE — ED Notes (Signed)
Correction in documentation Audelia Acton.

## 2011-03-22 NOTE — ED Notes (Signed)
Patient is resting comfortably. 

## 2011-03-22 NOTE — ED Provider Notes (Signed)
History     CSN: [id number]  Arrival date & time 03/22/11  1645   First MD Initiated Contact with Patient 03/22/11 1744      Chief Complaint  Patient presents with  . Pleurisy  . Cough    Pleasant 41 yo female with PMH significant for  IIIC high grade endometrial carcinoma in , post treatment.  Productive cough for past 4 days.  Chest pain w/ cough. Pain with coughing.  No fevers.  No numbness, weakness or tingling. No difficulty breathing. No calf pain. As for syncope. This is a nonsmoker. She's had no headaches. No vomiting   (Consider location/radiation/quality/duration/timing/severity/associated sxs/prior treatment) HPI  Past Medical History  Diagnosis Date  . Hypertension     SINCE FIRST PREGNANCY  . Asthma   . Constipation   . History of miscarriage   . Preeclampsia   . Uterine cancer   . Postoperative ileus 07/2010  . Iron deficiency anemia     s/p transfusions [facility]    Past Surgical History  Procedure Date  . Cesarean section   . Abdominal hysterectomy 07-08-10     TAH-BSO, WITH OMENTECTOMY AND BILAT. PELVIC AND PERIAORTIC LYMPH NODE DISSECTION  . Appendectomy     IN CHILDHOOD    Family History  Problem Relation Age of Onset  . Diabetes Mother   . Hypertension Mother   . Hypertension Father     History  Substance Use Topics  . Smoking status: Never Smoker   . Smokeless tobacco: Not on file  . Alcohol Use: Yes     occ.    OB History    Grav Para Term Preterm Abortions TAB SAB Ect Mult Living   4 3 2 1 1  0 1   3      Review of Systems  All other systems reviewed and are negative.    Allergies  Review of patient's allergies indicates no known allergies.  Home Medications   Current Outpatient Rx  Name Route Sig Dispense Refill  . AMLODIPINE BESYLATE 10 MG PO TABS Oral Take 10 mg by mouth daily.      Marland Kitchen DOCUSATE SODIUM 100 MG PO CAPS Oral Take 100 mg by mouth 2 (two) times daily as needed. For constipation    . IBUPROFEN 800 MG PO TABS Oral  Take 1 tablet (800 mg total) by mouth every 8 (eight) hours as needed. pain 30 tablet 2  . OXYCODONE-ACETAMINOPHEN 5-325 MG PO TABS Oral Take 1 tablet by mouth every 4 (four) hours as needed. pain       BP 129/78  Pulse 88  Temp 98 F (36.7 C)  Resp 16  SpO2 99%  LMP 07/02/2010  Physical Exam  Nursing note and vitals reviewed. Constitutional: She is oriented to person, place, and time. She appears well-developed and well-nourished.  HENT:  Head: Normocephalic and atraumatic.  Eyes: Conjunctivae and EOM are normal. Pupils are equal, round, and reactive to light.  Neck: Neck supple.  Cardiovascular: Normal rate and regular rhythm.  Exam reveals no gallop and no friction rub.   No murmur heard. Pulmonary/Chest: Breath sounds normal. She has no wheezes. She has no rales. She exhibits no tenderness.       Dry cough. Otherwise, negative.  Abdominal: Soft. Bowel sounds are normal. She exhibits no distension. There is no tenderness. There is no rebound and no guarding.  Musculoskeletal: Normal range of motion. She exhibits no edema and no tenderness.  Neurological: She is alert and oriented to person,  place, and time. No cranial nerve deficit. Coordination normal.  Skin: Skin is warm and dry. No rash noted.  Psychiatric: She has a normal mood and affect.    ED Course  Procedures (including critical care time)  Labs Reviewed - No data to display No results found.   No diagnosis found.    MDM  Patient is seen and examined, initial history and physical is completed. Evaluation initiated    Date: 03/22/2011  Rate: 95  Rhythm: normal sinus rhythm  QRS Axis: normal  Intervals: normal  ST/T Wave abnormalities: nonspecific ST/T changes  Conduction Disutrbances:none  Narrative Interpretation:   Old EKG Reviewed: none available     Results for orders placed during the hospital encounter of 03/22/11  CBC      Component Value Range   WBC 5.8  4.0 - 10.5 (K/uL)   RBC 3.67 (*)  3.87 - 5.11 (MIL/uL)   Hemoglobin 9.9 (*) 12.0 - 15.0 (g/dL)   HCT 29.5 (*) 62.1 - 46.0 (%)   MCV 84.7  78.0 - 100.0 (fL)   MCH 27.0  26.0 - 34.0 (pg)   MCHC 31.8  30.0 - 36.0 (g/dL)   RDW 30.8 (*) 65.7 - 15.5 (%)   Platelets 191  150 - 400 (K/uL)  DIFFERENTIAL      Component Value Range   Neutrophils Relative 71  43 - 77 (%)   Lymphocytes Relative 19  12 - 46 (%)   Monocytes Relative 8  3 - 12 (%)   Eosinophils Relative 2  0 - 5 (%)   Basophils Relative 0  0 - 1 (%)   Neutro Abs 4.1  1.7 - 7.7 (K/uL)   Lymphs Abs 1.1  0.7 - 4.0 (K/uL)   Monocytes Absolute 0.5  0.1 - 1.0 (K/uL)   Eosinophils Absolute 0.1  0.0 - 0.7 (K/uL)   Basophils Absolute 0.0  0.0 - 0.1 (K/uL)   RBC Morphology CRENATED RBCs    COMPREHENSIVE METABOLIC PANEL      Component Value Range   Sodium 140  135 - 145 (mEq/L)   Potassium 3.4 (*) 3.5 - 5.1 (mEq/L)   Chloride 103  96 - 112 (mEq/L)   CO2 27  19 - 32 (mEq/L)   Glucose, Bld 103 (*) 70 - 99 (mg/dL)   BUN 16  6 - 23 (mg/dL)   Creatinine, Ser 8.46  0.50 - 1.10 (mg/dL)   Calcium 9.7  8.4 - 96.2 (mg/dL)   Total Protein 7.9  6.0 - 8.3 (g/dL)   Albumin 3.7  3.5 - 5.2 (g/dL)   AST 20  0 - 37 (U/L)   ALT 22  0 - 35 (U/L)   Alkaline Phosphatase 118 (*) 39 - 117 (U/L)   Total Bilirubin 0.2 (*) 0.3 - 1.2 (mg/dL)   GFR calc non Af Amer 83 (*) >90 (mL/min)   GFR calc Af Amer >90  >90 (mL/min)  LACTIC ACID, PLASMA      Component Value Range   Lactic Acid, Venous 1.5  0.5 - 2.2 (mmol/L)  PROCALCITONIN      Component Value Range   Procalcitonin <0.10    URINALYSIS, ROUTINE W REFLEX MICROSCOPIC      Component Value Range   Color, Urine YELLOW  YELLOW    APPearance CLEAR  CLEAR    Specific Gravity, Urine 1.030  1.005 - 1.030    pH 5.0  5.0 - 8.0    Glucose, UA NEGATIVE  NEGATIVE (mg/dL)   Hgb  urine dipstick NEGATIVE  NEGATIVE    Bilirubin Urine NEGATIVE  NEGATIVE    Ketones, ur NEGATIVE  NEGATIVE (mg/dL)   Protein, ur NEGATIVE  NEGATIVE (mg/dL)    Urobilinogen, UA 0.2  0.0 - 1.0 (mg/dL)   Nitrite NEGATIVE  NEGATIVE    Leukocytes, UA NEGATIVE  NEGATIVE    Dg Chest 2 View  03/22/2011  *RADIOLOGY REPORT*  Clinical Data: Cough  CHEST - 2 VIEW  Comparison: 12/24/2010  Findings: Upper normal heart size.  Stable Port-A-Cath.  Clear lungs.  Bronchitic changes are chronic.  No pneumothorax and no pleural fluid.  IMPRESSION: No active cardiopulmonary disease.  Original Report Authenticated By: Donavan Burnet, M.D.     Initial lab studies, fairly unremarkable. Lactic acid was normal. White count was normal. Calcitonin was normal. Urine analysis was normal. Chest x-ray showed no acute cardiopulmonary disease. Disposition will be pending. CT angiogram of the chest.             Theron Arista A. Patrica Duel, MD 03/22/11 2334

## 2011-03-22 NOTE — ED Notes (Signed)
IV team responded aware of need for access of port or new site

## 2011-03-22 NOTE — ED Notes (Signed)
Joselyn Glassman RN not successful will access to [facility] port- IV team notified and Joselyn Glassman [place] to update IV team on this pt

## 2011-03-22 NOTE — ED Notes (Signed)
Pt made aware of pending CT

## 2011-03-22 NOTE — ED Notes (Signed)
MD at bedside. 

## 2011-03-22 NOTE — ED Notes (Signed)
Pt has uterine cancer. Developed a productive cough 4 days ago and started having chest pain with the cough this am. States chest only hurts when she is coughing. Pt's last chemotherapy was in January. Pt states that she wanted to come to the ER to because of the chest pain. Pt also has an abdominal hernia that she is to have surgery sometime in the near future. Pt has a productive cough at this time.

## 2011-03-22 NOTE — ED Notes (Signed)
Tyler RN present at [facility] to access port for CT scan [employer] RN aware of delay for this pt.  CT updated on the current status of this pt

## 2011-03-23 ENCOUNTER — Telehealth: Payer: Self-pay

## 2011-03-23 LAB — POCT I-STAT TROPONIN I: Troponin i, poc: 0.01 ng/mL (ref 0.00–0.08)

## 2011-03-23 MED ORDER — BENZONATATE 100 MG PO CAPS: 100.0000 mg | ORAL_CAPSULE | Freq: Three times a day (TID) | ORAL | Status: AC | PRN

## 2011-03-23 MED ORDER — IOHEXOL 300 MG/ML  SOLN
100.0000 mL | Freq: Once | INTRAMUSCULAR | Status: AC | PRN
  Administered 2011-03-23: 100 mL via INTRAVENOUS

## 2011-03-23 MED ORDER — AZITHROMYCIN 250 MG PO TABS
500.0000 mg | ORAL_TABLET | Freq: Once | ORAL | Status: AC
  Administered 2011-03-23: 500 mg via ORAL
  Filled 2011-03-23: qty 2

## 2011-03-23 MED ORDER — AZITHROMYCIN 250 MG PO TABS: 250.0000 mg | ORAL_TABLET | Freq: Every day | ORAL | Status: AC

## 2011-03-23 MED ORDER — AZITHROMYCIN 250 MG PO TABS: 250.0000 mg | ORAL_TABLET | Freq: Every day | ORAL | Status: DC

## 2011-03-23 NOTE — Telephone Encounter (Signed)
SPOKE WITH [name] AND TOLD HER THAT THE INFORMATION FROM THE ER WAS GATHERED AND WILL DISCUSS WITH GYN ONC AND DR. Darrold Span  WHEN THEY ARE IN THE OFFICE.  DR. Darrold Span IS OUT FOR THE WEEK AND GYN ONC OUT TIL WED. 03-25-11. THERE WERE SOME NEW NODULES ON CT SCAN DONE 03-22-11 IN THE LEFT LUNG. WILL CALL HER WITH FOLLOW UP PLAN WHEN DECIDED.

## 2011-03-23 NOTE — Discharge Instructions (Signed)
Your x-ray and CAT scan shows that you do not have a blood clot, you did not have any obvious signs of pneumonia. You do have some new nodules in your left lung and some swollen lymph nodes in your chest. This is concerning for possible cancer and you must call your doctor's in the morning to schedule a followup visit. You may return to the emergency department immediately for severe or worsening symptoms.  Zithromax for your cough.

## 2011-03-23 NOTE — ED Notes (Signed)
Patient given discharge instructions, information, prescriptions, and diet order. Patient states that they adequately understand discharge information given and to return to ED if symptoms return or worsen.     

## 2011-03-23 NOTE — ED Provider Notes (Signed)
  Physical Exam  BP 120/99  Pulse 72  Temp 98.5 F (36.9 C)  Resp 18  SpO2 100%  LMP 07/02/2010  Physical Exam  ED Course  Procedures  MDM Change of shift, care except from Dr. Patrica Duel - CT scan reviewed and shows no left hilar lymphadenopathy and pulmonary nodules, no signs of pulmonary embolism or infection. I have discussed these findings with the patient who is aware and is expressed her understanding of the need to followup with oncology this week. She states that she still has contact information and knows to call. Vital signs are normal, Zithromax given prior to discharge and prescription for home.      Vida Roller, MD 03/23/11 0200

## 2011-03-24 ENCOUNTER — Encounter: Payer: Self-pay | Admitting: Oncology

## 2011-03-24 LAB — URINE CULTURE

## 2011-03-24 NOTE — Progress Notes (Signed)
Patient received one prescription from [place] [facility] on 03/23/11 $3.54,her remaning balance [facility] $139.43.

## 2011-03-26 ENCOUNTER — Encounter: Payer: Self-pay | Admitting: Gynecologic Oncology

## 2011-03-26 ENCOUNTER — Ambulatory Visit: Payer: Self-pay | Attending: Gynecologic Oncology | Admitting: Gynecologic Oncology

## 2011-03-26 VITALS — BP 128/80 | HR 76 | Temp 98.2°F | Resp 18 | Ht 66.0 in | Wt 204.9 lb

## 2011-03-26 DIAGNOSIS — C541 Malignant neoplasm of endometrium: Secondary | ICD-10-CM

## 2011-03-26 DIAGNOSIS — Z90710 Acquired absence of both cervix and uterus: Secondary | ICD-10-CM | POA: Insufficient documentation

## 2011-03-26 DIAGNOSIS — C549 Malignant neoplasm of corpus uteri, unspecified: Secondary | ICD-10-CM | POA: Insufficient documentation

## 2011-03-26 DIAGNOSIS — Z9221 Personal history of antineoplastic chemotherapy: Secondary | ICD-10-CM | POA: Insufficient documentation

## 2011-03-26 DIAGNOSIS — I10 Essential (primary) hypertension: Secondary | ICD-10-CM | POA: Insufficient documentation

## 2011-03-26 DIAGNOSIS — Z833 Family history of diabetes mellitus: Secondary | ICD-10-CM | POA: Insufficient documentation

## 2011-03-26 DIAGNOSIS — R911 Solitary pulmonary nodule: Secondary | ICD-10-CM | POA: Insufficient documentation

## 2011-03-26 DIAGNOSIS — Z923 Personal history of irradiation: Secondary | ICD-10-CM | POA: Insufficient documentation

## 2011-03-26 NOTE — Patient Instructions (Signed)
Follow up for PET scan

## 2011-03-26 NOTE — Progress Notes (Signed)
Consult Note: Gyn-Onc  Krystal [name] 41 y.o. female  CC:  Chief Complaint  Patient presents with  . Endo ca    Follow up    HPI: Patient is a very pleasant 41 year old with stage IIIc endometrial carcinoma who underwent went to surgery in June of 2012. 9 of 34 lymph nodes were involved as well she had deep myometrial invasion, cervical and adnexal involvement. She completed 6 cycles of paclitaxel and carboplatin and exam which form with external beam radiation and high-dose radiation. She completed her chemotherapy 01/28/2011. She had a posttreatment CT scan of the abdomen and pelvis February 6 which revealed an enlarging 6 mm nodule in the left lower lung. There is no adenopathy or soft tissue masses. The CT did not have a dedicated chest component only the abdomen and pelvis with lung windows. She then presented to the emergency room with a chest pain and a cough. Of note her young child had a pneumonia. Due to her history she had a CT scan angiography which was compared to CT scan from December 12. It revealed no evidence of a pulmonary embolism. There was prevascular soft tissue density that was not unchanged measuring 1.9 x 3 cm. There was abnormal mediastinal and left hilar adenopathy appreciated. A 1.5 cm short axis diameter prevascular node and an adjacent 1.5 cm short axis diameter prevascular node were noted. In addition, it showed a 7 mm left lower pulmonary nodule and a 5 mm left lower pulmonary nodule. It is difficult to ascertain whether or not these are significantly different from the 6 mm nodule noted on the CT scan of her abdomen and pelvis that was obtained in February. She comes in today to discuss these results.  10 point review of systems is negative.  Interval History:   Review of Systems: As above  Current Meds:  Outpatient Encounter Prescriptions as of 03/26/2011  Medication Sig Dispense Refill  . amLODipine (NORVASC) 10 MG tablet Take 10 mg by mouth daily.        Marland Kitchen  azithromycin (ZITHROMAX Z-PAK) 250 MG tablet Take 1 tablet (250 mg total) by mouth daily. 500mg  PO day 1, then 250mg  PO days 205  6 tablet  0  . benzonatate (TESSALON PERLES) 100 MG capsule Take 1 capsule (100 mg total) by mouth 3 (three) times daily as needed for cough.  20 capsule  0  . docusate sodium (COLACE) 100 MG capsule Take 100 mg by mouth 2 (two) times daily as needed. For constipation      . ibuprofen (ADVIL,MOTRIN) 800 MG tablet Take 1 tablet (800 mg total) by mouth every 8 (eight) hours as needed. pain  30 tablet  2  . oxyCODONE-acetaminophen (PERCOCET) 5-325 MG per tablet Take 1 tablet by mouth every 4 (four) hours as needed. pain         Allergy: No Known Allergies  Social Hx:   History   Social History  . Marital Status: Single    Spouse Name: N/A    Number of Children: N/A  . Years of Education: N/A   Occupational History  . Not on file.   Social History Main Topics  . Smoking status: Never Smoker   . Smokeless tobacco: Not on file  . Alcohol Use: Yes     occ.  . Drug Use: No  . Sexually Active: Not Currently     due to surgery   Other Topics Concern  . Not on file   Social History Narrative  .  No narrative on file    Past Surgical Hx:  Past Surgical History  Procedure Date  . Cesarean section   . Abdominal hysterectomy 07-08-10     TAH-BSO, WITH OMENTECTOMY AND BILAT. PELVIC AND PERIAORTIC LYMPH NODE DISSECTION  . Appendectomy     IN CHILDHOOD    Past Medical Hx:  Past Medical History  Diagnosis Date  . Hypertension     SINCE FIRST PREGNANCY  . Asthma   . Constipation   . History of miscarriage   . Preeclampsia   . Uterine cancer   . Postoperative ileus 07/2010  . Iron deficiency anemia     s/p transfusions [facility]    Family Hx:  Family History  Problem Relation Age of Onset  . Diabetes Mother   . Hypertension Mother   . Hypertension Father     Vitals:  Blood pressure 128/80, pulse 76, temperature 98.2 F (36.8 C), resp. rate 18,  height 5\' 6"  (1.676 m), weight 204 lb 14.4 oz (92.942 kg), last menstrual period 07/02/2010.  Physical Exam: Well-nourished well-developed female in no acute distress.  Neck: No lymphadenopathy no thyromegaly.  Lungs: Clear to auscultation bilaterally.  Cardiovascular: Regular rate and rhythm.  Abdomen: 5 cm is reducible incisional hernia. Nontender. There is no masses or nodularity.  Groins: No lymphadenopathy.  Extremities: No edema.  Pelvic: Normal external female genitalia. The vagina is atrophic. The vaginal cuff is visualized. There is no visible lesions. Bimanual examination reveals no masses or nodularity  Assessment/Plan:  41 year old with stage IIIc high-grade endometrial carcinoma diagnosed in June of 2012. She was treated with paclitaxel and carboplatin x6 cycles sandwiched with external beam and vaginal cuff brachytherapy. She now appears to have a questionable pulmonary metastases and hilar adenopathy. She did have a small pulmonary nodule noticed on her CT of the abdomen pelvis from February. It is difficult to ascertain if this is a significant increase in disease however, compared to the CT scan of the chest she had in June of 2012 this does appear to be new disease. Discussed with the patient obtaining a PET CT to confirm that this is metastatic disease. If it is metastatic disease 20 to proceed with chemotherapy. Short interval between completion of chemotherapy in January of this year and a recurrent I would lean he reports clinical trials her Adriamycin-based treatment. We'll contact the patient with the results of her PET scan results and treatment disposition planning.  Her questions as well as those of her partner were elicited in answer to her satisfaction today. Brace Welte A., MD 03/26/2011, 9:07 AM

## 2011-03-27 ENCOUNTER — Telehealth: Payer: Self-pay

## 2011-03-27 NOTE — Telephone Encounter (Signed)
Krystal [name] STATES THAT HER THROAT IS STICKING TOGETHER AND DRY.  SHE IS ABLE TO BREATHE AND SWALLOW FINE. SHE FINISHED Z-PACK THAT SHE RECEIVED IN THE ED FOR CHEST PAIN AND COUGH.  THE CONGESTION AND COUGH IS A LITTLE BETTER.  TOLD HER THAT THE Z-PACK ATB CONTINUES TO WORK FOR A FEW DAYS BEYOND WHEN COMPLETED.  Krystal [name] STATES THAT SHE IS NOT DRINKING MUCH FLUID. ENCOURAGED HER TO INCREASE HER FLUIDS TO 8 OZ WVERY 2 HOURS AND GET SOME THROAT LOZNGES.  SHE HAS ENVIRONMENTAL ALLERGIES AND HAS MEDICATION FOR THIS.  SHE WILL START TAKING IT.  TOLD HER IF SHE FEELS WORSE OR NOT SIGNIFICANTLY BETTER BY Monday TO GO TO THE [facility].  THEY WILL HAVE ALL HER PAST RECORDS IN THE COMPUTER SYSTEM.  PT. APPRECIATED THE CALL.

## 2011-03-28 LAB — CULTURE, BLOOD (ROUTINE X 2)
Culture  Setup Time: 201303102210
Culture: NO GROWTH

## 2011-04-07 ENCOUNTER — Encounter (HOSPITAL_COMMUNITY)
Admission: RE | Admit: 2011-04-07 | Discharge: 2011-04-07 | Disposition: A | Discharge status: Home / Self Care | Payer: Self-pay | Source: Ambulatory Visit | Attending: Gynecologic Oncology | Admitting: Gynecologic Oncology

## 2011-04-07 DIAGNOSIS — R911 Solitary pulmonary nodule: Secondary | ICD-10-CM | POA: Insufficient documentation

## 2011-04-07 DIAGNOSIS — G9389 Other specified disorders of brain: Secondary | ICD-10-CM | POA: Insufficient documentation

## 2011-04-07 DIAGNOSIS — C541 Malignant neoplasm of endometrium: Secondary | ICD-10-CM

## 2011-04-07 DIAGNOSIS — C549 Malignant neoplasm of corpus uteri, unspecified: Secondary | ICD-10-CM | POA: Insufficient documentation

## 2011-04-07 MED ORDER — FLUDEOXYGLUCOSE F - 18 (FDG) INJECTION
15.7000 | Freq: Once | INTRAVENOUS | Status: AC | PRN
  Administered 2011-04-07: 15.7 via INTRAVENOUS

## 2011-04-10 ENCOUNTER — Encounter: Payer: Self-pay | Admitting: Oncology

## 2011-04-10 ENCOUNTER — Other Ambulatory Visit: Payer: Self-pay | Admitting: Oncology

## 2011-04-10 ENCOUNTER — Telehealth: Payer: Self-pay | Admitting: Oncology

## 2011-04-10 ENCOUNTER — Telehealth: Payer: Self-pay

## 2011-04-10 DIAGNOSIS — C541 Malignant neoplasm of endometrium: Secondary | ICD-10-CM

## 2011-04-10 NOTE — Progress Notes (Signed)
Patient called for result of PET done 04-07-2011; gyn onc not here today. I spoke with patient by phone and told her there are concerning lymph nodes. I offered to see her 04-13-2011, which she would like; I have sent email to Dr.Gehrig also. Patient is feeling ok.

## 2011-04-10 NOTE — Telephone Encounter (Signed)
gave patient appointment for 04-13-2011 at 10:45am per the orders from 04-10-2011

## 2011-04-10 NOTE — Telephone Encounter (Signed)
Krystal [name] CALLED TO GET PET SCAN RESULTS FROM 04-07-11.

## 2011-04-10 NOTE — Telephone Encounter (Signed)
Added lb to 4/1 appt. S/w pt she is aware of new time for 4/1 @ 10:15 am.

## 2011-04-13 ENCOUNTER — Encounter: Payer: Self-pay | Admitting: Oncology

## 2011-04-13 ENCOUNTER — Telehealth: Payer: Self-pay | Admitting: Oncology

## 2011-04-13 ENCOUNTER — Other Ambulatory Visit: Payer: Self-pay

## 2011-04-13 ENCOUNTER — Other Ambulatory Visit: Payer: Self-pay | Admitting: Lab

## 2011-04-13 ENCOUNTER — Ambulatory Visit (HOSPITAL_BASED_OUTPATIENT_CLINIC_OR_DEPARTMENT_OTHER): Payer: Self-pay | Admitting: Oncology

## 2011-04-13 VITALS — BP 135/89 | HR 72 | Temp 99.1°F | Ht 66.0 in | Wt 207.0 lb

## 2011-04-13 DIAGNOSIS — C541 Malignant neoplasm of endometrium: Secondary | ICD-10-CM

## 2011-04-13 DIAGNOSIS — K439 Ventral hernia without obstruction or gangrene: Secondary | ICD-10-CM

## 2011-04-13 DIAGNOSIS — C549 Malignant neoplasm of corpus uteri, unspecified: Secondary | ICD-10-CM

## 2011-04-13 LAB — CBC WITH DIFFERENTIAL/PLATELET
Eosinophils Absolute: 0 10*3/uL (ref 0.0–0.5)
HCT: 31.2 % — ABNORMAL LOW (ref 34.8–46.6)
LYMPH%: 32.4 % (ref 14.0–49.7)
MCHC: 31.7 g/dL (ref 31.5–36.0)
MCV: 82.7 fL (ref 79.5–101.0)
MONO#: 0.3 10*3/uL (ref 0.1–0.9)
MONO%: 9.7 % (ref 0.0–14.0)
NEUT#: 1.9 10*3/uL (ref 1.5–6.5)
NEUT%: 55.7 % (ref 38.4–76.8)
Platelets: 215 10*3/uL (ref 145–400)
RBC: 3.77 10*6/uL (ref 3.70–5.45)
WBC: 3.3 10*3/uL — ABNORMAL LOW (ref 3.9–10.3)

## 2011-04-13 LAB — COMPREHENSIVE METABOLIC PANEL
BUN: 14 mg/dL (ref 6–23)
CO2: 25 mEq/L (ref 19–32)
Calcium: 9.8 mg/dL (ref 8.4–10.5)
Chloride: 108 mEq/L (ref 96–112)
Creatinine, Ser: 0.8 mg/dL (ref 0.50–1.10)
Total Bilirubin: 0.3 mg/dL (ref 0.3–1.2)

## 2011-04-13 MED ORDER — FERROUS FUMARATE 324 MG PO TABS: 1.0000 | ORAL_TABLET | Freq: Every day | ORAL | Status: DC

## 2011-04-13 MED ORDER — POLYETHYLENE GLYCOL 3350 17 GM/SCOOP PO POWD: 17.0000 g | Freq: Two times a day (BID) | ORAL | Status: DC | PRN

## 2011-04-13 MED ORDER — ONDANSETRON HCL 8 MG PO TABS: 8.0000 mg | ORAL_TABLET | Freq: Two times a day (BID) | ORAL | Status: DC | PRN

## 2011-04-13 MED ORDER — LORAZEPAM 1 MG PO TABS: ORAL_TABLET | ORAL | Status: DC

## 2011-04-13 MED ORDER — MAGNESIUM OXIDE 400 MG PO TABS: 400.0000 mg | ORAL_TABLET | Freq: Every day | ORAL | Status: DC

## 2011-04-13 NOTE — Progress Notes (Signed)
OFFICE PROGRESS NOTE Date of Visit 04-13-2011 Physicians: M.Dwain Sarna, P.Gehrig, J.Kinard  INTERVAL HISTORY:  Patient is seen, together with significant other, earlier than scheduled visit as PET done 04-07-2011 shows clinical progression of disease in a few lymph nodes in chest, abdomen and pelvis. I spoke with patient by phone on 04-10-11 and was also in communication with Dr Duard Brady then. Following patient's visit today, I spoke with interventional radiologist, who does not feel that any of the nodes seen on PET are amenable to CT needle biopsy now (including gastrohepatic due to location, left common iliac due to small size or nodes in central chest).  Patient had seen Dr Duard Brady on 03-26-2011, her exam not remarkable, however CT angio chest done in ED 03-23-11 (for what was eventually diagnosed to be a respiratory infection) showed a 7 mm and a 5 mm left lower lobe pulmonary nodule, and left hilar and mediastinal nodes 1.3 - 1.5 cm. The PET was ordered by Dr Duard Brady, and has prevascular and AP window nodes 1.4 and 1.5 cm in chest with SUV 7.6 - 12.7, a 1.8 cm gastrohepatic node with SUV 14.2, a left common iliac 8mm node with SUV 8.1 and ill-defined soft tissue in anterior mediastinum likely residual thymus 2 x 3.6 cm with SUV 5.7. There is no uptake in lung,size of those nodules below resolution of PET. She has some calcification right frontal/temporal lobe incidentally noted, nonspecific and suggesting dural finding or meningioma. We have discussed the PET information now and I have shown her PACS images. We discussed trying CT needle biopsy if possible, or resuming chemotherapy with different agents and following with scans if unable to biopsy now (see above). I have told patient that involvement just in these lymph nodes is not a vital organ, that aim of chemo will be to keep cancer from progressing, and that it could move to vital organs such as lung or liver if we cannot keep it controlled and that she could  die from the cancer if so. There is some language barrier especially with the significant other, but patient seems to understand the discussion well. History in this 41 yo patient is of IIIC high grade endometrial carcinoma, treated with surgery at diagnosis June 2012 by Dr.ClarkePearson, with 9/34 nodes involved as well as deep myometrial, cervical and adnexal involvement. She had total 6 cycles of taxol/carboplatin completed 01-28-2011, with external beam radiation + HDR given after first 3 chemotherapy treatments. She has PAC still in. Last CT abdomen/pelvis was done in the [facility] 02-18-2011, with an enlarging 6 mm nodule LLL lung, small fluid, no adenopathy or soft tissue mass, ventral hernia, no other obvious disease; subsequent CT chest and PET as above.   Other than anxiety, including difficulty sleeping, related to cancer, patient has been feeling well. She is not any more symptomatic from the large ventral abdominal hernia and does not want to delay chemotherapy to have this addressed by Dr.Wakefield now. The respiratory infection resolved entirely and she has had no further fevers or symptoms of infection. Bowels are moving well with miralax, the PAC has been fine, appetite is generally good, no bleeding, no HA or other neurologic symptoms, no bladder symptoms. Remainder of 10 point Review of Systems negative. Her 3 small children are well now; her mother has not been able to come to [place] as yet. 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  Objective:  Vital signs in last 24 hours:  BP 135/89  Pulse 72  Temp(Src) 99.1 F (37.3 C) (Oral)  Ht 5\' 6"  (1.676 m)  Wt 207 lb (93.895 kg)  BMI 33.41 kg/m2  LMP 06/20/2012This weight is up 7 lbs from Feb. Alert, easily mobile, upset but NAD. Wearing wig.  HEENT:mucous membranes moist, pharynx normal without lesionsPERRL, not icteric. LymphaticsCervical, supraclavicular, and axillary nodes normal.. No inguinal  adenopathy palpable Resp: clear to auscultation bilaterally and normal percussion bilaterally Cardio: regular rate and rhythm GI: soft, non-tender; bowel sounds normal; no masses,  no organomegaly. Large ventral hernia mid abdomen to left of midline, easily reducible and nontender. Extremities: extremities normal, atraumatic, no cyanosis or edema Neuro CN, motor, sensory nonfocal  Portacath without erythema or tenderness.  Lab Results:   Basename 04/13/11 1012  WBC 3.3*  HGB 9.9*  HCT 31.2*  PLT 215  ANC 1.9, MCV 82.7, RDW 17  BMET  Basename 04/13/11 1012  NA 141  K 3.9  CL 108  CO2 25  GLUCOSE 96  BUN 14  CREATININE 0.80  CALCIUM 9.8   Remainder of full CMET available after visit normal Studies/Results: PET as above Medications: I have reviewed the patient's current medications.  We have discussed resuming chemotherapy with adriamycin-based regimen. I have discussed GOG findings of improved response rate and progression free survival  with adria + CDDP as opposed to single agent adria, tho certainly this combination could have very significant nausea and cytopenias. She has had teaching on these agents by RN now. She would like to begin with both drugs and understands that we could change to single Adria if combination is not tolerated. We will need to repeat scans after at least 3 cycles. She will need echocardiogram prior to starting adria. I have left financial staff know plan, as she has no insurance coverage. I have discussed with pharmacy, and for cycle 1 I have used thymoma careplan with adjustments. Will use 50 mg/m2 for both agents cycle 1 due to prior chemo and RT; GOG studies used 60mg /m2 for adria and 50mg /m2 for CDDP. She will need neulasta and I have planned IVF with IV antiemetics cycle 1. Cycle 1 will be given 4-15 with neulasta and IVF/nausea meds 4-16.  Assessment/Plan: 1. High grade IIIC endometrial carcinoma: history as above, adjuvant chemo with  taxol/carbo + RT just completed midJan, now CT/PET evidence of involved nodes chest/abd/pelvis which are not amenable to needle biopsy. Plan to begin chemotherapy with adriamycin + CDDP and follow scans. 2.PAC in 3.difficult social situation as she is not [place] citizen 4.ventral hernia: I will let Dr.Wakefield know situation. Will delay hernia repair due to chemotherapy.  I will see her back 4-22 or sooner if needed. Patient had all questions answered to her satisfaction and was in agreement with plan. [name], MD   04/13/2011, 4:36 PM

## 2011-04-13 NOTE — Patient Instructions (Signed)
Will set up echocardiogram to check heart before the new chemo.  Medicines called in to [facility].

## 2011-04-13 NOTE — Telephone Encounter (Signed)
Gv pt appt for april2013.  scheduled echo for 04/09 @ [facility]

## 2011-04-15 ENCOUNTER — Encounter: Payer: Self-pay | Admitting: Oncology

## 2011-04-15 NOTE — Progress Notes (Signed)
I call Department of Social Service today and they said her Medicaid ended on June 30 th 2012,so she would have to re-apply all over again,she has no case Financial controller.

## 2011-04-17 ENCOUNTER — Encounter: Payer: Self-pay | Admitting: Oncology

## 2011-04-17 ENCOUNTER — Telehealth: Payer: Self-pay

## 2011-04-17 NOTE — Telephone Encounter (Signed)
SPOKE WITH MS. Rodeheaver AND TOLD HER THAT THE RADIOLOGIST DOES NOT THINK A NEEDLE BIOPSY WOULD BE POSSIBLE DUE TO THE LOCATIONS AND SMALL SIZE OF THE LYMPH NODES. DR. Darrold Span THINKS IT IS BEST TO GO AHEAD WITH CHEMOTHERAPY THEN RECHECK SCANS AS DISCUSSED AT VISIT ON  04-13-11. PT. VERBALIZED UNDERSTANDING.

## 2011-04-17 NOTE — Progress Notes (Signed)
Patient received five prescriptions from [facility] on 04/16/11 $34.66,her remaninig balance [facility] $104.77.

## 2011-04-21 ENCOUNTER — Other Ambulatory Visit: Payer: Self-pay | Admitting: *Deleted

## 2011-04-21 ENCOUNTER — Telehealth: Payer: Self-pay | Admitting: *Deleted

## 2011-04-21 ENCOUNTER — Other Ambulatory Visit: Payer: Self-pay

## 2011-04-21 ENCOUNTER — Ambulatory Visit (HOSPITAL_COMMUNITY)
Admission: RE | Admit: 2011-04-21 | Discharge: 2011-04-21 | Disposition: A | Discharge status: Home / Self Care | Payer: Self-pay | Source: Ambulatory Visit | Attending: Oncology | Admitting: Oncology

## 2011-04-21 ENCOUNTER — Telehealth: Payer: Self-pay

## 2011-04-21 DIAGNOSIS — Z09 Encounter for follow-up examination after completed treatment for conditions other than malignant neoplasm: Secondary | ICD-10-CM | POA: Insufficient documentation

## 2011-04-21 DIAGNOSIS — C541 Malignant neoplasm of endometrium: Secondary | ICD-10-CM

## 2011-04-21 DIAGNOSIS — I10 Essential (primary) hypertension: Secondary | ICD-10-CM

## 2011-04-21 DIAGNOSIS — C549 Malignant neoplasm of corpus uteri, unspecified: Secondary | ICD-10-CM | POA: Insufficient documentation

## 2011-04-21 DIAGNOSIS — M7989 Other specified soft tissue disorders: Secondary | ICD-10-CM

## 2011-04-21 MED ORDER — AMLODIPINE BESYLATE 10 MG PO TABS: 10.0000 mg | ORAL_TABLET | Freq: Every day | ORAL | Status: DC

## 2011-04-21 NOTE — Progress Notes (Signed)
  Echocardiogram 2D Echocardiogram has been performed.  Cathie Beams Deneen 04/21/2011, 10:00 AM

## 2011-04-21 NOTE — Telephone Encounter (Signed)
Pt. Here at [facility] this am for echo (which she has completed).  Her complaint this am is that her left leg is swollen from her ankle to calf.  She just noticed this after her shower this am.  She denies redness or pain.  She also denies any SOB and is having no other sx.  Discussed with Dr. Cleophas Dunker and pt. Taken to [facility] admitting via wc for venous doppler of left lower extremity.  Called report requested to Dr. Cleophas Dunker nurse at [phone].  Pt. To stay at vascular lab until instructions given from Dr. Cleophas Dunker

## 2011-04-21 NOTE — Progress Notes (Signed)
VASCULAR LAB PRELIMINARY  PRELIMINARY  PRELIMINARY  PRELIMINARY  Left lower extremity venous duplex completed.    Preliminary report:  Left:  No evidence of DVT, superficial thrombosis, or Baker's cyst.  Terance Hart, RVT 04/21/2011, 11:04 AM

## 2011-04-21 NOTE — Telephone Encounter (Addendum)
[facility] CALLED AND STATED THAT THE DOPPLER OF HER LEFT LEG WAS NEGATIVE FOR DVT. TOLD PT. THAT SHE NEEDS TO KEEP LEG ELEVATED AS MUCH AS POSSIBLE.  DR. Darrold Span SUGGESTS LIGHT SUPPOR HOSE THAT SHE CAN FET AT [facility]ORE.  PT. VERBALIZED UNDERSTANDING. MS. Mitton INQUIRED IF NOT TAKING HER NORVASC FOR ~WEEK COULD BE CONTRIBUTING TO THE SWELLING. SHE CAN NOT AFFORD TO GO TO THE GYN DOC. WHO PRESCRIBED MEDICATION.  SHE HAS TO GO FOR A VISIT  TO GET NORVASC REFILLED,  APPT. ~ %80.00.  COULD DR. Darrold Span REFILL HER NORVASC? DR. Darrold Span WILL REFILL NORVASC FOR NOW.

## 2011-04-22 ENCOUNTER — Encounter: Payer: Self-pay | Admitting: Oncology

## 2011-04-22 ENCOUNTER — Telehealth: Payer: Self-pay

## 2011-04-22 NOTE — Progress Notes (Signed)
Patient received one prescription from [facility] on 04/21/11 $3.00,her remaninig balance [facility] $101.77.

## 2011-04-22 NOTE — Telephone Encounter (Signed)
SPOKE WITH MS. Marini AND TOLD HER THAT THE ECHOCARDIOGRAM WAS GOOD FOR THE CHEMO TO BEGIN ON 04-27-11. SHE STATED THAT HER LEFT ANKLE AND FOOT SWELLING WAS DOWN SOME TODAY.  SHE IS KEEPING IT ELEVATED AS MUCH AS POSSIBLE AND WILL GET SUPPORT PANTY HOSE TODAY.

## 2011-04-24 ENCOUNTER — Telehealth: Payer: Self-pay

## 2011-04-24 ENCOUNTER — Telehealth: Payer: Self-pay | Admitting: Oncology

## 2011-04-24 ENCOUNTER — Other Ambulatory Visit: Payer: Self-pay | Admitting: Oncology

## 2011-04-24 NOTE — Telephone Encounter (Signed)
email to gyn onc with re upcoming chemo. She is for CT 5-9, planned prior to recent imaging, then visit to Dr.ClarkePearson 5-13; she may not need that CT so soon.

## 2011-04-24 NOTE — Telephone Encounter (Signed)
SPOKE WITH MS. Jubb AND TOLD HER THAT SHE NEEDS TO TAKE THE MAGNESIUM DAILY EVEN DAYS NOT RECEIVING CHEMOTHERAPY  TO HELP KEEP HER MAGNESIUM LEVEL UP AS THE CDDP DEPLETES THE MAGNESIUM IN THE BODY. SHE IS TO START THIS ON 04-27-11.  SHE CAN TAKE IT IN THE AM OR PM JUST AS LONG AS SHE TAKES IT DAILY.  PT. VERBALIZED UNDERSTANDING.   TOLD HER THAT MAGNESIUM CAN CAUSE DIARRHEA SO THIS MAY ACTUALLY HELP HER WITH HER CONSTIPATION ISSUES.

## 2011-04-27 ENCOUNTER — Encounter: Payer: Self-pay | Admitting: Oncology

## 2011-04-27 ENCOUNTER — Other Ambulatory Visit (HOSPITAL_BASED_OUTPATIENT_CLINIC_OR_DEPARTMENT_OTHER): Payer: Self-pay | Admitting: Lab

## 2011-04-27 ENCOUNTER — Ambulatory Visit (HOSPITAL_BASED_OUTPATIENT_CLINIC_OR_DEPARTMENT_OTHER): Payer: Self-pay

## 2011-04-27 VITALS — BP 140/97 | HR 87 | Temp 97.0°F

## 2011-04-27 DIAGNOSIS — C549 Malignant neoplasm of corpus uteri, unspecified: Secondary | ICD-10-CM

## 2011-04-27 DIAGNOSIS — C541 Malignant neoplasm of endometrium: Secondary | ICD-10-CM

## 2011-04-27 DIAGNOSIS — Z5111 Encounter for antineoplastic chemotherapy: Secondary | ICD-10-CM

## 2011-04-27 LAB — CBC WITH DIFFERENTIAL/PLATELET
BASO%: 0.2 % (ref 0.0–2.0)
Basophils Absolute: 0 10*3/uL (ref 0.0–0.1)
Eosinophils Absolute: 0.1 10*3/uL (ref 0.0–0.5)
HCT: 34.7 % — ABNORMAL LOW (ref 34.8–46.6)
HGB: 11 g/dL — ABNORMAL LOW (ref 11.6–15.9)
MONO#: 0.4 10*3/uL (ref 0.1–0.9)
NEUT#: 4 10*3/uL (ref 1.5–6.5)
NEUT%: 71 % (ref 38.4–76.8)
Platelets: 233 10*3/uL (ref 145–400)
WBC: 5.6 10*3/uL (ref 3.9–10.3)
lymph#: 1.2 10*3/uL (ref 0.9–3.3)

## 2011-04-27 MED ORDER — HEPARIN SOD (PORK) LOCK FLUSH 100 UNIT/ML IV SOLN
500.0000 [IU] | Freq: Once | INTRAVENOUS | Status: AC | PRN
  Administered 2011-04-27: 500 [IU]
  Filled 2011-04-27: qty 5

## 2011-04-27 MED ORDER — SODIUM CHLORIDE 0.9 % IJ SOLN
10.0000 mL | INTRAMUSCULAR | Status: DC | PRN
Start: 2011-04-27 — End: 2011-04-27
  Administered 2011-04-27: 10 mL
  Filled 2011-04-27: qty 10

## 2011-04-27 MED ORDER — DOXORUBICIN HCL CHEMO IV INJECTION 2 MG/ML
50.0000 mg/m2 | Freq: Once | INTRAVENOUS | Status: AC
  Administered 2011-04-27: 104 mg via INTRAVENOUS
  Filled 2011-04-27: qty 52

## 2011-04-27 MED ORDER — PALONOSETRON HCL INJECTION 0.25 MG/5ML
0.2500 mg | Freq: Once | INTRAVENOUS | Status: AC
  Administered 2011-04-27: 0.25 mg via INTRAVENOUS

## 2011-04-27 MED ORDER — SODIUM CHLORIDE 0.9 % IV SOLN
50.0000 mg/m2 | Freq: Once | INTRAVENOUS | Status: AC
  Administered 2011-04-27: 105 mg via INTRAVENOUS
  Filled 2011-04-27: qty 105

## 2011-04-27 MED ORDER — FOSAPREPITANT DIMEGLUMINE INJECTION 150 MG
150.0000 mg | Freq: Once | INTRAVENOUS | Status: DC
  Filled 2011-04-27: qty 5

## 2011-04-27 MED ORDER — DEXAMETHASONE SODIUM PHOSPHATE 4 MG/ML IJ SOLN: 12.0000 mg | Freq: Once | INTRAMUSCULAR | Status: DC

## 2011-04-27 MED ORDER — MANNITOL 25 % IV SOLN
Freq: Once | INTRAVENOUS | Status: AC
  Administered 2011-04-27: 10:00:00 via INTRAVENOUS
  Filled 2011-04-27: qty 10

## 2011-04-27 NOTE — Progress Notes (Signed)
Patient came in this morning complaining about her stomach hurting really bad, patient stated she was constipated really bad.

## 2011-04-27 NOTE — Progress Notes (Signed)
Patient came in today, gave patient medicaid application and EPP application to reapply for assistance. I advised patient to bring application back in before 05/07/11 which is when her discount will end.

## 2011-04-28 ENCOUNTER — Ambulatory Visit (HOSPITAL_BASED_OUTPATIENT_CLINIC_OR_DEPARTMENT_OTHER): Payer: Self-pay

## 2011-04-28 VITALS — BP 127/82 | HR 87 | Temp 97.0°F

## 2011-04-28 DIAGNOSIS — C549 Malignant neoplasm of corpus uteri, unspecified: Secondary | ICD-10-CM

## 2011-04-28 DIAGNOSIS — C541 Malignant neoplasm of endometrium: Secondary | ICD-10-CM

## 2011-04-28 MED ORDER — POTASSIUM CHLORIDE 2 MEQ/ML IV SOLN
INTRAVENOUS | Status: DC
  Administered 2011-04-28: 12:00:00 via INTRAVENOUS
  Filled 2011-04-28: qty 1000

## 2011-04-28 MED ORDER — DEXAMETHASONE SODIUM PHOSPHATE 10 MG/ML IJ SOLN
10.0000 mg | Freq: Once | INTRAMUSCULAR | Status: AC
  Administered 2011-04-28: 10 mg via INTRAVENOUS

## 2011-04-28 MED ORDER — SODIUM CHLORIDE 0.9 % IJ SOLN
10.0000 mL | INTRAMUSCULAR | Status: DC | PRN
  Administered 2011-04-28: 10 mL via INTRAVENOUS
  Filled 2011-04-28: qty 10

## 2011-04-28 MED ORDER — HEPARIN SOD (PORK) LOCK FLUSH 100 UNIT/ML IV SOLN
500.0000 [IU] | Freq: Once | INTRAVENOUS | Status: AC
  Administered 2011-04-28: 500 [IU] via INTRAVENOUS
  Filled 2011-04-28: qty 5

## 2011-04-28 MED ORDER — LORAZEPAM 1 MG PO TABS
1.0000 mg | ORAL_TABLET | Freq: Once | ORAL | Status: AC
  Administered 2011-04-28: 1 mg via ORAL

## 2011-04-29 ENCOUNTER — Emergency Department (HOSPITAL_COMMUNITY): Payer: Medicaid Other

## 2011-04-29 ENCOUNTER — Other Ambulatory Visit: Payer: Self-pay | Admitting: Lab

## 2011-04-29 ENCOUNTER — Inpatient Hospital Stay (HOSPITAL_COMMUNITY)
Admission: EM | Admit: 2011-04-29 | Discharge: 2011-05-21 | DRG: 389 | Disposition: A | Discharge status: Home Health | Payer: Medicaid Other | Attending: Internal Medicine | Admitting: Internal Medicine

## 2011-04-29 ENCOUNTER — Encounter (HOSPITAL_COMMUNITY): Payer: Self-pay

## 2011-04-29 DIAGNOSIS — K56609 Unspecified intestinal obstruction, unspecified as to partial versus complete obstruction: Secondary | ICD-10-CM

## 2011-04-29 DIAGNOSIS — C55 Malignant neoplasm of uterus, part unspecified: Secondary | ICD-10-CM

## 2011-04-29 DIAGNOSIS — K5909 Other constipation: Secondary | ICD-10-CM | POA: Diagnosis present

## 2011-04-29 DIAGNOSIS — N179 Acute kidney failure, unspecified: Secondary | ICD-10-CM | POA: Diagnosis present

## 2011-04-29 DIAGNOSIS — R638 Other symptoms and signs concerning food and fluid intake: Secondary | ICD-10-CM | POA: Diagnosis present

## 2011-04-29 DIAGNOSIS — K5900 Constipation, unspecified: Secondary | ICD-10-CM | POA: Diagnosis present

## 2011-04-29 DIAGNOSIS — T451X5A Adverse effect of antineoplastic and immunosuppressive drugs, initial encounter: Secondary | ICD-10-CM | POA: Diagnosis present

## 2011-04-29 DIAGNOSIS — N1330 Unspecified hydronephrosis: Secondary | ICD-10-CM | POA: Diagnosis present

## 2011-04-29 DIAGNOSIS — C541 Malignant neoplasm of endometrium: Secondary | ICD-10-CM

## 2011-04-29 DIAGNOSIS — C549 Malignant neoplasm of corpus uteri, unspecified: Secondary | ICD-10-CM | POA: Diagnosis present

## 2011-04-29 DIAGNOSIS — R180 Malignant ascites: Secondary | ICD-10-CM | POA: Diagnosis present

## 2011-04-29 DIAGNOSIS — I10 Essential (primary) hypertension: Secondary | ICD-10-CM | POA: Diagnosis present

## 2011-04-29 DIAGNOSIS — D709 Neutropenia, unspecified: Secondary | ICD-10-CM | POA: Diagnosis present

## 2011-04-29 DIAGNOSIS — D72829 Elevated white blood cell count, unspecified: Secondary | ICD-10-CM | POA: Insufficient documentation

## 2011-04-29 DIAGNOSIS — E876 Hypokalemia: Secondary | ICD-10-CM | POA: Diagnosis present

## 2011-04-29 DIAGNOSIS — D6959 Other secondary thrombocytopenia: Secondary | ICD-10-CM | POA: Diagnosis present

## 2011-04-29 DIAGNOSIS — R55 Syncope and collapse: Secondary | ICD-10-CM | POA: Diagnosis present

## 2011-04-29 DIAGNOSIS — D6481 Anemia due to antineoplastic chemotherapy: Secondary | ICD-10-CM | POA: Diagnosis present

## 2011-04-29 DIAGNOSIS — D630 Anemia in neoplastic disease: Secondary | ICD-10-CM | POA: Diagnosis present

## 2011-04-29 DIAGNOSIS — K5669 Other intestinal obstruction: Principal | ICD-10-CM | POA: Diagnosis present

## 2011-04-29 DIAGNOSIS — K56600 Partial intestinal obstruction, unspecified as to cause: Secondary | ICD-10-CM

## 2011-04-29 DIAGNOSIS — C799 Secondary malignant neoplasm of unspecified site: Secondary | ICD-10-CM

## 2011-04-29 DIAGNOSIS — D638 Anemia in other chronic diseases classified elsewhere: Secondary | ICD-10-CM | POA: Diagnosis present

## 2011-04-29 DIAGNOSIS — D696 Thrombocytopenia, unspecified: Secondary | ICD-10-CM | POA: Diagnosis present

## 2011-04-29 LAB — DIFFERENTIAL
Lymphocytes Relative: 7 % — ABNORMAL LOW (ref 12–46)
Lymphs Abs: 0.8 10*3/uL (ref 0.7–4.0)
Monocytes Absolute: 0.7 10*3/uL (ref 0.1–1.0)
Monocytes Relative: 6 % (ref 3–12)
Neutro Abs: 10.4 10*3/uL — ABNORMAL HIGH (ref 1.7–7.7)
Neutrophils Relative %: 87 % — ABNORMAL HIGH (ref 43–77)

## 2011-04-29 LAB — LIPASE, BLOOD: Lipase: 18 U/L (ref 11–59)

## 2011-04-29 LAB — MAGNESIUM: Magnesium: 2.4 mg/dL (ref 1.5–2.5)

## 2011-04-29 LAB — COMPREHENSIVE METABOLIC PANEL
ALT: 23 U/L (ref 0–35)
Alkaline Phosphatase: 99 U/L (ref 39–117)
BUN: 19 mg/dL (ref 6–23)
CO2: 25 mEq/L (ref 19–32)
GFR calc Af Amer: 71 mL/min — ABNORMAL LOW (ref >90)
GFR calc non Af Amer: 61 mL/min — ABNORMAL LOW (ref >90)
Glucose, Bld: 115 mg/dL — ABNORMAL HIGH (ref 70–99)
Potassium: 4.2 mEq/L (ref 3.5–5.1)
Sodium: 139 mEq/L (ref 135–145)
Total Bilirubin: 0.4 mg/dL (ref 0.3–1.2)

## 2011-04-29 LAB — URINALYSIS, MICROSCOPIC ONLY
Bilirubin Urine: NEGATIVE
Hgb urine dipstick: NEGATIVE
Ketones, ur: NEGATIVE mg/dL
Nitrite: NEGATIVE
Protein, ur: NEGATIVE mg/dL
Urobilinogen, UA: 0.2 mg/dL (ref 0.0–1.0)

## 2011-04-29 LAB — CBC
HCT: 33.3 % — ABNORMAL LOW (ref 36.0–46.0)
Hemoglobin: 10.5 g/dL — ABNORMAL LOW (ref 12.0–15.0)
RBC: 4.06 MIL/uL (ref 3.87–5.11)
WBC: 11.9 10*3/uL — ABNORMAL HIGH (ref 4.0–10.5)

## 2011-04-29 MED ORDER — MAGNESIUM OXIDE 400 MG PO TABS
400.0000 mg | ORAL_TABLET | Freq: Every day | ORAL | Status: DC
  Administered 2011-04-29 – 2011-05-01 (×3): 400 mg via ORAL
  Filled 2011-04-29 (×5): qty 1

## 2011-04-29 MED ORDER — ACETAMINOPHEN 325 MG PO TABS
650.0000 mg | ORAL_TABLET | Freq: Four times a day (QID) | ORAL | Status: DC | PRN
  Administered 2011-05-01 – 2011-05-15 (×3): 650 mg via ORAL
  Filled 2011-04-29 (×3): qty 2
  Filled 2011-04-29: qty 1

## 2011-04-29 MED ORDER — ACETAMINOPHEN 650 MG RE SUPP: 650.0000 mg | Freq: Four times a day (QID) | RECTAL | Status: DC | PRN

## 2011-04-29 MED ORDER — ALBUTEROL SULFATE (5 MG/ML) 0.5% IN NEBU
2.5000 mg | INHALATION_SOLUTION | Freq: Four times a day (QID) | RESPIRATORY_TRACT | Status: DC
  Administered 2011-04-29 (×2): 2.5 mg via RESPIRATORY_TRACT
  Filled 2011-04-29 (×2): qty 0.5

## 2011-04-29 MED ORDER — HYDROMORPHONE HCL PF 1 MG/ML IJ SOLN
0.5000 mg | INTRAMUSCULAR | Status: DC | PRN
  Administered 2011-04-29: 0.5 mg via INTRAVENOUS
  Filled 2011-04-29: qty 1

## 2011-04-29 MED ORDER — IOHEXOL 300 MG/ML  SOLN
100.0000 mL | Freq: Once | INTRAMUSCULAR | Status: AC | PRN
  Administered 2011-04-29: 100 mL via INTRAVENOUS

## 2011-04-29 MED ORDER — LORAZEPAM 0.5 MG PO TABS
0.5000 mg | ORAL_TABLET | ORAL | Status: DC | PRN
  Administered 2011-04-29 – 2011-05-17 (×31): 0.5 mg via SUBLINGUAL
  Filled 2011-04-29 (×31): qty 1

## 2011-04-29 MED ORDER — ENOXAPARIN SODIUM 40 MG/0.4ML ~~LOC~~ SOLN
40.0000 mg | SUBCUTANEOUS | Status: DC
  Administered 2011-04-29 – 2011-05-20 (×19): 40 mg via SUBCUTANEOUS
  Filled 2011-04-29 (×23): qty 0.4

## 2011-04-29 MED ORDER — FILGRASTIM 300 MCG/ML IJ SOLN
300.0000 ug | Freq: Every day | INTRAMUSCULAR | Status: DC
  Administered 2011-04-30 – 2011-05-02 (×3): 300 ug via SUBCUTANEOUS
  Filled 2011-04-29 (×7): qty 1

## 2011-04-29 MED ORDER — OXYCODONE HCL 5 MG PO TABS
5.0000 mg | ORAL_TABLET | ORAL | Status: DC | PRN
  Administered 2011-04-29 – 2011-05-08 (×4): 5 mg via ORAL
  Filled 2011-04-29 (×5): qty 1

## 2011-04-29 MED ORDER — MORPHINE SULFATE 4 MG/ML IJ SOLN
4.0000 mg | Freq: Once | INTRAMUSCULAR | Status: AC
  Administered 2011-04-29: 4 mg via INTRAVENOUS
  Filled 2011-04-29: qty 1

## 2011-04-29 MED ORDER — ONDANSETRON HCL 4 MG/2ML IJ SOLN
4.0000 mg | Freq: Once | INTRAMUSCULAR | Status: AC
  Administered 2011-04-29: 4 mg via INTRAVENOUS
  Filled 2011-04-29: qty 2

## 2011-04-29 MED ORDER — SODIUM CHLORIDE 0.9 % IV SOLN: INTRAVENOUS | Status: DC

## 2011-04-29 MED ORDER — HYDROMORPHONE HCL PF 1 MG/ML IJ SOLN
1.0000 mg | INTRAMUSCULAR | Status: DC | PRN
Start: 2011-04-29 — End: 2011-05-08
  Administered 2011-04-29 – 2011-05-06 (×6): 1 mg via INTRAVENOUS
  Filled 2011-04-29 (×7): qty 1

## 2011-04-29 MED ORDER — ALBUTEROL SULFATE (5 MG/ML) 0.5% IN NEBU: 2.5000 mg | INHALATION_SOLUTION | RESPIRATORY_TRACT | Status: DC | PRN

## 2011-04-29 MED ORDER — IPRATROPIUM BROMIDE 0.02 % IN SOLN: 0.5000 mg | RESPIRATORY_TRACT | Status: DC | PRN

## 2011-04-29 MED ORDER — GUAIFENESIN-DM 100-10 MG/5ML PO SYRP
5.0000 mL | ORAL_SOLUTION | ORAL | Status: DC | PRN
  Administered 2011-05-20: 5 mL via ORAL
  Filled 2011-04-29 (×3): qty 10

## 2011-04-29 NOTE — Progress Notes (Signed)
MEDICAL ONCOLOGY  Date of  Consult 04-29-2011 Reason for Consult: progressive endometrial carcinoma with partial SBO Referring Physician: hospitalist Other physicians: P.Gehrig/ D.ClarkePearson, M.Dwain Sarna, J.Kinard  Krystal [name] is an 41 y.o. 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 She had first treatment of cisplatin/adriamycin at [facility] on 04-27-2011 and has now been admitted thru ED with partial small bowel obstruction.  History is of full term pregnancy delivered by [facility] Nov 2011, then heavy vaginal bleeding from Feb thru June 2012. She was found to have high grade endometrial carcinoma and went to TAH/BSO/omentectomy/nodes by Dr.ClarkePearson at [facility] 07-08-2010 with pathology showing poorly differentiated serous carcinoma extending into outer half of myometrium, involving lower uterine segment and endocervix, extensive LVSI, 9 of 34 nodes involved with extracapsular extension and invasion into large vessels, and malignant cells in ascites fluid. The surgery was optimal debulking. She ws treated with three cycles of taxol/ carboplatin, then RT, then three more cycles of taxol/ carboplatin thru 01-28-2011. CT chest in March was concerning for possible pulmonary mets and PET 04-07-2011 had hypermetabolic nodes in chest/abdomen/pelvis. She had good LVF on echocardiogram and was treated with first cisplatin/adriamycin this week as above.   Patient has had remarkable little nausea from chemotherapy this week, did have additional IVF and IV antiemetics at [facility] on 04-28-2011. She has chronic constipation (since childhood, also severe ileus after gyn onc surgery) but generally manages this with miralax, however bowels have not moved in past several days. She had intermittent abdominal pain, which became severe last pm such that she presented to ED. CT AP done today is noted below.   She has known  large ventral abdominal hernia and has been seen for this by Dr. Darnelle Spangle, tho repair was not pursued due to progressive disease. She has had no concern for incarceration of bowel in this hernia.  Patient is seen now with several visitors, whom she introduces as family and requests to stay in room during my visit.  Review of Systems: no fever. Is voiding well, although abdomen is painful when she gets up. No vomiting. No bleeding. No shortness of breath. No problems with PAC. No swelling LE.   Allergies: No Known Allergies Past Medical History  Diagnosis Date  . Hypertension     SINCE FIRST PREGNANCY  . Asthma   . Constipation   . History of miscarriage   . Preeclampsia   . Postoperative ileus 07/2010  . Iron deficiency anemia     s/p transfusions [facility]  . Uterine cancer     Past Surgical History  Procedure Date  . Cesarean section   . Abdominal hysterectomy 07-08-10     TAH-BSO, WITH OMENTECTOMY AND BILAT. PELVIC AND PERIAORTIC LYMPH NODE DISSECTION  . Appendectomy     IN CHILDHOOD    Family History  Problem Relation Age of Onset  . Diabetes Mother   . Hypertension Mother   . Hypertension Father     Social History:  reports that she has never smoked. She has never used smokeless tobacco. She reports that she drinks alcohol. She reports that she does not use illicit drugs. Patient came to [place] from [place] ~ 13 years ago and is not [place] citizen. 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 She has 3 young daughters; father of the 2 youngest lives here and father of the older child is in [place]. She has a cousin in [place].  Medications  reviewed.  Blood pressure 149/89, pulse 86, temperature 98.3 F (36.8 C), temperature source Oral, resp. rate 16, height 5\' 6"  (1.676 m), weight 205 lb (92.987 kg), last menstrual period 07/02/2010, SpO2 98.00%. Awake, alert, looks somewhat uncomfortable tho tells me pain is much better just post IV pain medication. Respirations not  labored. Does not have complete alopecia. Mouth a little dry without lesions. PAC site fine. PERRL, not icteric. Neck supple without JVD. Lungs without wheezes or rales. Cor RRR. Abdomen somewhat distended but not tight, quiet, soft. Ventral hernia noticeable even supine. Cannot feel liver edge. LE no edema or cords.   Results for orders placed during the hospital encounter of 04/29/11 (from the past 48 hour(s))  CBC     Status: Abnormal   Collection Time   04/29/11  3:26 AM      Component Value Range Comment   WBC 11.9 (*) 4.0 - 10.5 (K/uL)    RBC 4.06  3.87 - 5.11 (MIL/uL)    Hemoglobin 10.5 (*) 12.0 - 15.0 (g/dL)    HCT 16.1 (*) 09.6 - 46.0 (%)    MCV 82.0  78.0 - 100.0 (fL)    MCH 25.9 (*) 26.0 - 34.0 (pg)    MCHC 31.5  30.0 - 36.0 (g/dL)    RDW 04.5 (*) 40.9 - 15.5 (%)    Platelets 315  150 - 400 (K/uL)   DIFFERENTIAL     Status: Abnormal   Collection Time   04/29/11  3:26 AM      Component Value Range Comment   Neutrophils Relative 87 (*) 43 - 77 (%)    Neutro Abs 10.4 (*) 1.7 - 7.7 (K/uL)    Lymphocytes Relative 7 (*) 12 - 46 (%)    Lymphs Abs 0.8  0.7 - 4.0 (K/uL)    Monocytes Relative 6  3 - 12 (%)    Monocytes Absolute 0.7  0.1 - 1.0 (K/uL)    Eosinophils Relative 0  0 - 5 (%)    Eosinophils Absolute 0.0  0.0 - 0.7 (K/uL)    Basophils Relative 0  0 - 1 (%)    Basophils Absolute 0.0  0.0 - 0.1 (K/uL)   COMPREHENSIVE METABOLIC PANEL     Status: Abnormal   Collection Time   04/29/11  3:26 AM      Component Value Range Comment   Sodium 139  135 - 145 (mEq/L)    Potassium 4.2  3.5 - 5.1 (mEq/L)    Chloride 103  96 - 112 (mEq/L)    CO2 25  19 - 32 (mEq/L)    Glucose, Bld 115 (*) 70 - 99 (mg/dL)    BUN 19  6 - 23 (mg/dL)    Creatinine, Ser 8.11  0.50 - 1.10 (mg/dL)    Calcium 9.5  8.4 - 10.5 (mg/dL)    Total Protein 7.9  6.0 - 8.3 (g/dL)    Albumin 3.8  3.5 - 5.2 (g/dL)    AST 25  0 - 37 (U/L)    ALT 23  0 - 35 (U/L)    Alkaline Phosphatase 99  39 - 117 (U/L)    Total  Bilirubin 0.4  0.3 - 1.2 (mg/dL)    GFR calc non Af Amer 61 (*) >90 (mL/min)    GFR calc Af Amer 71 (*) >90 (mL/min)   LIPASE, BLOOD     Status: Normal   Collection Time   04/29/11  3:26 AM      Component Value  Range Comment   Lipase 18  11 - 59 (U/L)   MAGNESIUM     Status: Normal   Collection Time   04/29/11  3:26 AM      Component Value Range Comment   Magnesium 2.4  1.5 - 2.5 (mg/dL)   URINALYSIS, WITH MICROSCOPIC     Status: Abnormal   Collection Time   04/29/11  8:50 AM      Component Value Range Comment   Color, Urine YELLOW  YELLOW     APPearance CLEAR  CLEAR     Specific Gravity, Urine 1.019  1.005 - 1.030     pH 6.0  5.0 - 8.0     Glucose, UA NEGATIVE  NEGATIVE (mg/dL)    Hgb urine dipstick NEGATIVE  NEGATIVE     Bilirubin Urine NEGATIVE  NEGATIVE     Ketones, ur NEGATIVE  NEGATIVE (mg/dL)    Protein, ur NEGATIVE  NEGATIVE (mg/dL)    Urobilinogen, UA 0.2  0.0 - 1.0 (mg/dL)    Nitrite NEGATIVE  NEGATIVE     Leukocytes, UA NEGATIVE  NEGATIVE     WBC, UA 7-10  <3 (WBC/hpf)    RBC / HPF 0-2  <3 (RBC/hpf)    Bacteria, UA FEW (*) RARE     Squamous Epithelial / LPF FEW (*) RARE      Ct Abdomen Pelvis W Contrast  04/29/2011  **ADDENDUM** CREATED: 04/29/2011 09:14:54  These results were called by telephone on 04/29/2011  at  0915 hours to  Dr. Raeford Razor, who verbally acknowledged these results.  **END ADDENDUM** SIGNED BY: Charline Bills, M.D.    04/29/2011  *RADIOLOGY REPORT*  Clinical Data: Abdominal pain, endometrial cancer diagnosed 06/2010 status post hysterectomy, omentectomy, and bilateral pelvic and para-aortic lymph node dissection, ongoing chemotherapy, prior appendectomy  CT ABDOMEN AND PELVIS WITH CONTRAST  Technique:  Multidetector CT imaging of the abdomen and pelvis was performed following the standard protocol during bolus administration of intravenous contrast.  Contrast: OMNIPAQUE IOHEXOL 300 MG/ML  SOLN  Comparison: PET CT dated 04/07/2011  Findings:  Mild patchy opacities at the lung bases.  Small cysts and too small to characterize lesions in the liver, unchanged.  Spleen, pancreas, and adrenal glands are within normal limits.  Gallbladder is underdistended.  No intrahepatic or extrahepatic ductal dilatation.  Left kidney is notable for mild left hydronephrosis and diminished enhancement relative to the right.  Dilated loops of small bowel in the left upper abdomen.  Relative narrowing in the central abdomen (series 2/image 52, possibly reflecting a focal transition, although distal pelvic loops of bowel are not decompressed (series 2/image 79).  At the time of the study, oral contrast has not yet passed to this area.  Angulation of a loop of small bowel in the central abdomen (series 2/image 52), suggesting adhesions.  Additional transition in the left mid abdomen (series 2/image 57) with decompressed distal loops of small bowel.  Midline lower pelvic ventral hernia containing a loop of small bowel (series 2/image 81).  No evidence of abdominal aortic aneurysm.  1.7 cm short axis gastrohepatic nodal metastasis (series 2/image 24).  Additional smaller retroperitoneal nodes measuring up to 8 mm short axis (series 2/image 47). Prior para-aortic/pelvic lymph node dissection.  Interval development of moderate abdominopelvic ascites with peritoneal nodularity in the left upper abdomen (series 2/image 37), suspicious for peritoneal disease.  Status post hysterectomy.  No adnexal masses.  No ureteral or bladder calculi.  Mid/distal left ureter is narrowed, possibly  reflecting extensive compression.  Bladder is within normal limits.  Visualized osseous structures are within normal limits.  IMPRESSION: Dilated loops of small bowel in the left upper abdomen, compatible with at least early/partial small bowel obstruction, possibly on the basis of adhesions.  Additional suspected focal transition in the left mid abdomen.  Interval development of moderate abdominopelvic  ascites with peritoneal nodularity, suggesting peritoneal disease.  Gastrohepatic and retroperitoneal nodal metastases, as described above.  Diminished enhancement of the left kidney relative to the right with mild left hydronephrosis, possibly on the basis of extrinsic compression on the mid/lower ureter by peritoneal disease.  Midline lower pelvic ventral hernia containing a loop of small bowel. Original Report Authenticated By: Charline Bills, M.D.        Assessment/Plan: 1.Progressive metastatic endometrial carcinoma: now complicated by partial SBO, day 3 cycle 1 adriamycin/cisplatin. Agree with bowel rest and prn pain medication. I will start neupogen as I anticipate significant drop in counts from chemotherapy in next week or so. She has significant, chronic constipation which preceeded cancer diagnosis and certainly will be much worse with narcotics, so it may be worth trying to continue laxatives. Dr.Wakefield of general surgery knows her. 2.new mild left hydronephrosis, with renal function good. Will follow for now. 3.elevated WBC: may be from steroids used with antiemetics last 2 days, or from urine or abdomen. She has not had gCSF yet. Will send urine culture if not already done. 4.PAC in 5.Difficult social situation: no insurance/ not [place] citizen/ 3 small children (youngest 17 mo). Our chaplain and social workers have been involved and I will let them know of admission. I will follow with you --  Thank you  Kirsty Monjaraz P 04/29/2011, 5:29 PM

## 2011-04-29 NOTE — H&P (Signed)
PCP:   [name], MD, MD   Chief Complaint:  Abdominal cramping  HPI: [name] is a 41 year old African American female with past medical history significant for high-grade endometrial carcinoma stage IIIc diagnosed in June 2012 status post hysterectomy. Patient is being followed and treated with chemotherapy by Oncology. Patient stated that she was in the oncology office yesterday for IV hydration prior to starting her  chemotherapy. She stated that yesterday after leaving the oncology office she developed severe abdominal cramping but no nausea or vomiting. The abdominal cramping has gotten severely worse so she presented to the emergency room. In the emergency room she was noted to have extensive peritoneal. metastatic disease on CT scan of the abdomen and pelvis. She was also noted to have partial small bowel obstruction. We were asked to admit her for further management. Her Oncologist has been consulted.  Review of Systems:  10 point review of system negative otherwise stated in the history of present illness.  Past Medical History: Past Medical History  Diagnosis Date  . Hypertension     SINCE FIRST PREGNANCY  . Asthma   . Constipation   . History of miscarriage   . Preeclampsia   . Postoperative ileus 07/2010  . Iron deficiency anemia     s/p transfusions [facility]  . Uterine cancer    Past Surgical History  Procedure Date  . Cesarean section   . Abdominal hysterectomy 07-08-10     TAH-BSO, WITH OMENTECTOMY AND BILAT. PELVIC AND PERIAORTIC LYMPH NODE DISSECTION  . Appendectomy     IN CHILDHOOD    Medications: Prior to Admission medications   Medication Sig Start Date End Date Taking? Authorizing Provider  amLODipine (NORVASC) 10 MG tablet Take 1 tablet (10 mg total) by mouth daily. 04/21/11  Yes Lennis Buzzy Han, MD  Ferrous Fumarate 324 MG TABS Take 1 tablet (106 mg of iron total) by mouth daily. 04/13/11  Yes Lennis Buzzy Han, MD  ibuprofen (ADVIL,MOTRIN) 800 MG tablet  Take 1 tablet (800 mg total) by mouth every 8 (eight) hours as needed. pain 01/19/11  Yes Lennis Buzzy Han, MD  LORazepam (ATIVAN) 1 MG tablet TAKE 1 TABLET UNDER THE TONGUE OR SWALLOW EVERY 4 HOURS AS NEEDED  FOR NAUSEA.  WILL MAKE YOU DROWSY. 04/13/11  Yes Lennis Buzzy Han, MD  magnesium oxide (MAG-OX 400) 400 MG tablet Take 1 tablet (400 mg total) by mouth daily. 04/13/11  Yes Lennis P Livesay, MD  ondansetron (ZOFRAN) 8 MG tablet Take 1-2 tablets (8-16 mg total) by mouth every 12 (twelve) hours as needed. 04/13/11  Yes Lennis Buzzy Han, MD    Allergies:  No Known Allergies  Social History: Reports that she has never smoked. She has never used smokeless tobacco. She reports that she drinks alcohol. She reports that she does not use illicit drugs. She is single. She has a boyfriend. She has 3 small children that are 53-year-old 82-year-old and 12-year-old. She braids hair.  Family History: Family History  Problem Relation Age of Onset  . Diabetes Mother   . Hypertension Mother   . Hypertension Father     Physical Exam: Filed Vitals:   04/29/11 0205 04/29/11 0413 04/29/11 0804 04/29/11 1118  BP: 150/88 133/84 140/89 140/87  Pulse: 83 79 88 84  Temp: 99 F (37.2 C) 98.5 F (36.9 C) 98.7 F (37.1 C) 98.6 F (37 C)  TempSrc: Oral Oral Oral Oral  Resp: 20 18 18 16   Height: 5\' 6"  (1.676 m)  Weight: 92.987 kg (205 lb)     SpO2: 100% 98% 99% 99%   General: Patient does not seem to be in any acute distress. She is crying. HEENT: Head normocephalic atraumatic. Cardiovascular: Regular rate rhythm. Lungs: Clear to auscultation bilaterally. Abdomen: Mildly distended nontender positive bowel sounds with abdominal hernia. Extremities: No edema   Labs on Admission:   [facility] 04/29/11 0326  NA 139  K 4.2  CL 103  CO2 25  GLUCOSE 115*  BUN 19  CREATININE 1.09  CALCIUM 9.5  MG --  PHOS --    Basename 04/29/11 0326  AST 25  ALT 23  ALKPHOS 99  BILITOT 0.4  PROT 7.9  ALBUMIN  3.8    Basename 04/29/11 0326  LIPASE 18  AMYLASE --    Basename 04/29/11 0326 04/27/11 0852  WBC 11.9* 5.6  NEUTROABS 10.4* 4.0  HGB 10.5* 11.0*  HCT 33.3* 34.7*  MCV 82.0 81.1  PLT 315 233   No results found for this basename: CKTOTAL:3,CKMB:3,CKMBINDEX:3,TROPONINI:3 in the last 72 hours No results found for this basename: TSH,T4TOTAL,FREET3,T3FREE,THYROIDAB in the last 72 hours No results found for this basename: VITAMINB12:2,FOLATE:2,FERRITIN:2,TIBC:2,IRON:2,RETICCTPCT:2 in the last 72 hours  Radiological Exams on Admission: Ct Abdomen Pelvis W Contrast  04/29/2011  **ADDENDUM** CREATED: 04/29/2011 09:14:54  These results were called by telephone on 04/29/2011  at  0915 hours to  Dr. Raeford Razor, who verbally acknowledged these results.  **END ADDENDUM** SIGNED BY: Charline Bills, M.D.    04/29/2011  *RADIOLOGY REPORT*  Clinical Data: Abdominal pain, endometrial cancer diagnosed 06/2010 status post hysterectomy, omentectomy, and bilateral pelvic and para-aortic lymph node dissection, ongoing chemotherapy, prior appendectomy  CT ABDOMEN AND PELVIS WITH CONTRAST  Technique:  Multidetector CT imaging of the abdomen and pelvis was performed following the standard protocol during bolus administration of intravenous contrast.  Contrast: OMNIPAQUE IOHEXOL 300 MG/ML  SOLN  Comparison: PET CT dated 04/07/2011  Findings: Mild patchy opacities at the lung bases.  Small cysts and too small to characterize lesions in the liver, unchanged.  Spleen, pancreas, and adrenal glands are within normal limits.  Gallbladder is underdistended.  No intrahepatic or extrahepatic ductal dilatation.  Left kidney is notable for mild left hydronephrosis and diminished enhancement relative to the right.  Dilated loops of small bowel in the left upper abdomen.  Relative narrowing in the central abdomen (series 2/image 52, possibly reflecting a focal transition, although distal pelvic loops of bowel are not  decompressed (series 2/image 79).  At the time of the study, oral contrast has not yet passed to this area.  Angulation of a loop of small bowel in the central abdomen (series 2/image 52), suggesting adhesions.  Additional transition in the left mid abdomen (series 2/image 57) with decompressed distal loops of small bowel.  Midline lower pelvic ventral hernia containing a loop of small bowel (series 2/image 81).  No evidence of abdominal aortic aneurysm.  1.7 cm short axis gastrohepatic nodal metastasis (series 2/image 24).  Additional smaller retroperitoneal nodes measuring up to 8 mm short axis (series 2/image 47). Prior para-aortic/pelvic lymph node dissection.  Interval development of moderate abdominopelvic ascites with peritoneal nodularity in the left upper abdomen (series 2/image 37), suspicious for peritoneal disease.  Status post hysterectomy.  No adnexal masses.  No ureteral or bladder calculi.  Mid/distal left ureter is narrowed, possibly reflecting extensive compression.  Bladder is within normal limits.  Visualized osseous structures are within normal limits.  IMPRESSION: Dilated loops of small bowel in  the left upper abdomen, compatible with at least early/partial small bowel obstruction, possibly on the basis of adhesions.  Additional suspected focal transition in the left mid abdomen.  Interval development of moderate abdominopelvic ascites with peritoneal nodularity, suggesting peritoneal disease.  Gastrohepatic and retroperitoneal nodal metastases, as described above.  Diminished enhancement of the left kidney relative to the right with mild left hydronephrosis, possibly on the basis of extrinsic compression on the mid/lower ureter by peritoneal disease.  Midline lower pelvic ventral hernia containing a loop of small bowel. Original Report Authenticated By: Charline Bills, M.D.   Nm Pet Image Initial (pi) Skull Base To Thigh  04/07/2011  *RADIOLOGY REPORT*  Clinical Data: Subsequent treatment  strategy for endometrial cancer.  New lung nodules.  Rule out metastasis.  NUCLEAR MEDICINE PET CT SKULL BASE TO THIGH  Technique:  15.7 mCi F-18 FDG was injected intravenously via the right power port.  Full-ring PET imaging was performed from the skull base through the mid-thighs 55  minutes after injection.  CT data was obtained and used for attenuation correction and anatomic localization only.  (This was not acquired as a diagnostic CT examination.)  Fasting Blood Glucose:  89  Patient Weight:  204 pounds.  Comparison: Chest CT of 03/22/2011.  Abdominal pelvic CT of 02/18/2011.  Findings: PET images demonstrate moderate hypermetabolic "brown" fat within the neck.  This mildly degrades evaluation for hypermetabolic lymph nodes.  Hypermetabolic prevascular nodes. These measure up to 1.5 cm and a S.U.V. max of 12.4 on image 69. Central AP window node measures 1.4 cm and a S.U.V. max of 7.6 on image 69. Ill-defined soft tissue density within the anterior mediastinum measures 2.0 x 3.6 cm and a S.U.V. max of 5.7 on image 69.  Does have morphology suggestive of residual thymus or thymus based lesion.  Celiac/gastrohepatic ligament node measures 1.8 cm and a S.U.V. max of 14.2 on image 115.  Left common iliac 8 mm node which measures a S.U.V. max of 8.1 on image 177.  CT images performed for attenuation correction demonstrate area of peripheral calcification within the right frontal temporal lobe. No well-defined underlying mass.  Example image four.  Chest findings deferred to recent diagnostic chest CT. No acute superimposed process.  Small lung nodules described on the prior are below the resolution of PET. No acute findings within the abdomen or pelvis.  Hysterectomy.  Postoperative or radiation induced edema within the pelvis.  IMPRESSION: 1.  Hypermetabolic lymph nodes within the chest, abdomen, and pelvis.  Favored to represent metastatic disease from the patients endometrial cancer.  Anterior mediastinal  mildly hypermetabolic soft tissue could represent rebound thymic hyperplasia. 2.  Hypermetabolic "brown" fat within the neck and upper chest. Given this factor, no evidence of cervical disease. 3.  Minimal nonspecific calcification in the periphery of the right frontal/temporal region.  Either dural or possibly related to a small underlying meningioma.  This could be reevaluated on follow- up.  Original Report Authenticated By: Consuello Bossier, M.D.    Assessment/Plan .Partial small bowel obstruction .Leukocytosis Stage IIIc high-grade endometrial carcinoma Patient will be admitted to the hospital. Will make her n.p.o. except for ice chips. IV fluids. Her oncologist Dr. Darrold Span has been consulted per the emergency department. Will defer to them for further recommendations. She does have some mild hydronephrosis most likely secondary to the adhesions and obstruction. Will monitor for now. Will monitor her output. Patient admitted to the cancer floor.  KUB in the morning.  CODE STATUS was  not discussed. Will defer to oncology.  Time spent on this patient including examination and decision-making process:45 minutes.  Carollee Massed Pager [phone] 04/29/2011, 12:07 PM

## 2011-04-29 NOTE — ED Provider Notes (Signed)
Ct Abdomen Pelvis W Contrast  04/29/2011  **ADDENDUM** CREATED: 04/29/2011 09:14:54  These results were called by telephone on 04/29/2011  at  0915 hours to  Dr. Raeford Razor, who verbally acknowledged these results.  **END ADDENDUM** SIGNED BY: Charline Bills, M.D.    04/29/2011  *RADIOLOGY REPORT*  Clinical Data: Abdominal pain, endometrial cancer diagnosed 06/2010 status post hysterectomy, omentectomy, and bilateral pelvic and para-aortic lymph node dissection, ongoing chemotherapy, prior appendectomy  CT ABDOMEN AND PELVIS WITH CONTRAST  Technique:  Multidetector CT imaging of the abdomen and pelvis was performed following the standard protocol during bolus administration of intravenous contrast.  Contrast: OMNIPAQUE IOHEXOL 300 MG/ML  SOLN  Comparison: PET CT dated 04/07/2011  Findings: Mild patchy opacities at the lung bases.  Small cysts and too small to characterize lesions in the liver, unchanged.  Spleen, pancreas, and adrenal glands are within normal limits.  Gallbladder is underdistended.  No intrahepatic or extrahepatic ductal dilatation.  Left kidney is notable for mild left hydronephrosis and diminished enhancement relative to the right.  Dilated loops of small bowel in the left upper abdomen.  Relative narrowing in the central abdomen (series 2/image 52, possibly reflecting a focal transition, although distal pelvic loops of bowel are not decompressed (series 2/image 79).  At the time of the study, oral contrast has not yet passed to this area.  Angulation of a loop of small bowel in the central abdomen (series 2/image 52), suggesting adhesions.  Additional transition in the left mid abdomen (series 2/image 57) with decompressed distal loops of small bowel.  Midline lower pelvic ventral hernia containing a loop of small bowel (series 2/image 81).  No evidence of abdominal aortic aneurysm.  1.7 cm short axis gastrohepatic nodal metastasis (series 2/image 24).  Additional smaller  retroperitoneal nodes measuring up to 8 mm short axis (series 2/image 47). Prior para-aortic/pelvic lymph node dissection.  Interval development of moderate abdominopelvic ascites with peritoneal nodularity in the left upper abdomen (series 2/image 37), suspicious for peritoneal disease.  Status post hysterectomy.  No adnexal masses.  No ureteral or bladder calculi.  Mid/distal left ureter is narrowed, possibly reflecting extensive compression.  Bladder is within normal limits.  Visualized osseous structures are within normal limits.  IMPRESSION: Dilated loops of small bowel in the left upper abdomen, compatible with at least early/partial small bowel obstruction, possibly on the basis of adhesions.  Additional suspected focal transition in the left mid abdomen.  Interval development of moderate abdominopelvic ascites with peritoneal nodularity, suggesting peritoneal disease.  Gastrohepatic and retroperitoneal nodal metastases, as described above.  Diminished enhancement of the left kidney relative to the right with mild left hydronephrosis, possibly on the basis of extrinsic compression on the mid/lower ureter by peritoneal disease.  Midline lower pelvic ventral hernia containing a loop of small bowel. Original Report Authenticated By: Charline Bills, M.D.   Discussed findings with pt. Understandably very upset. Presenting symptoms likely 2/2 partial SBO. Multiple other concerning findings including interval progression of mets, development of ascites and mild L hydronephrosis likely 2/2 extrinsic compression. Discussed with Dr Darrold Span and hospitalist for admission.  Raeford Razor, MD 04/29/11 [phone]

## 2011-04-29 NOTE — ED Provider Notes (Signed)
History     CSN: [id number]  Arrival date & time 04/29/11  0059   First MD Initiated Contact with Patient 04/29/11 [phone]      Chief Complaint  Patient presents with  . Abdominal Pain     The history is provided by the patient.   patient has a history of uterine cancer status post chemotherapy and radiation now with recurrence of a possible node in her lung.  She is currently getting chemotherapy with her first dose of chemotherapy 2 days ago.  She's unsure if the nausea is caused by her chemotherapy or whether it's associated with her new abdominal discomfort.  Proximally 18 hours ago she developed some abdominal swelling and discomfort located around her umbilicus.  She reports it radiated to her the bilateral sides.  She denies dysuria or urinary frequency.  She's had no fevers, chills, melena, hematochezia.  She has had an appendectomy.  Her uterus was removed one year ago.  She reports her pain is mild to moderate.  Her pain as being constant.  Past Medical History  Diagnosis Date  . Hypertension     SINCE FIRST PREGNANCY  . Asthma   . Constipation   . History of miscarriage   . Preeclampsia   . Uterine cancer   . Postoperative ileus 07/2010  . Iron deficiency anemia     s/p transfusions [facility]    Past Surgical History  Procedure Date  . Cesarean section   . Abdominal hysterectomy 07-08-10     TAH-BSO, WITH OMENTECTOMY AND BILAT. PELVIC AND PERIAORTIC LYMPH NODE DISSECTION  . Appendectomy     IN CHILDHOOD    Family History  Problem Relation Age of Onset  . Diabetes Mother   . Hypertension Mother   . Hypertension Father     History  Substance Use Topics  . Smoking status: Never Smoker   . Smokeless tobacco: Not on file  . Alcohol Use: Yes     occ.    OB History    Grav Para Term Preterm Abortions TAB SAB Ect Mult Living   4 3 2 1 1  0 1   3      Review of Systems  All other systems reviewed and are negative.    Allergies  Review of patient's allergies  indicates no known allergies.  Home Medications   Current Outpatient Rx  Name Route Sig Dispense Refill  . AMLODIPINE BESYLATE 10 MG PO TABS Oral Take 1 tablet (10 mg total) by mouth daily. 30 tablet 2  . FERROUS FUMARATE 324 MG PO TABS Oral Take 1 tablet (106 mg of iron total) by mouth daily. 30 each 2  . IBUPROFEN 800 MG PO TABS Oral Take 1 tablet (800 mg total) by mouth every 8 (eight) hours as needed. pain 30 tablet 2  . LORAZEPAM 1 MG PO TABS  TAKE 1 TABLET UNDER THE TONGUE OR SWALLOW EVERY 4 HOURS AS NEEDED  FOR NAUSEA.  WILL MAKE YOU DROWSY. 30 tablet 0  . MAGNESIUM OXIDE 400 MG PO TABS Oral Take 1 tablet (400 mg total) by mouth daily. 30 tablet 2    BEGIN WHEN CHEMOTHERAPY STARTS.  Marland Kitchen ONDANSETRON HCL 8 MG PO TABS Oral Take 1-2 tablets (8-16 mg total) by mouth every 12 (twelve) hours as needed. 20 tablet 2    BP 133/84  Pulse 79  Temp(Src) 98.5 F (36.9 C) (Oral)  Resp 18  Ht 5\' 6"  (1.676 m)  Wt 205 lb (92.987 kg)  BMI  33.09 kg/m2  SpO2 98%  LMP 07/02/2010  Physical Exam  Nursing note and vitals reviewed. Constitutional: She is oriented to person, place, and time. She appears well-developed and well-nourished. No distress.  HENT:  Head: Normocephalic and atraumatic.  Eyes: EOM are normal.  Neck: Normal range of motion.  Cardiovascular: Normal rate, regular rhythm and normal heart sounds.   Pulmonary/Chest: Effort normal and breath sounds normal.  Abdominal: Soft. There is no tenderness.       Mild abd distention without tympany.  Mild generalized abdominal tenderness without guarding or rebound.  No focality of tenderness.   Musculoskeletal: Normal range of motion.  Neurological: She is alert and oriented to person, place, and time.  Skin: Skin is warm and dry.  Psychiatric: She has a normal mood and affect. Judgment normal.    ED Course  Procedures (including critical care time)  Labs Reviewed  CBC - Abnormal; Notable for the following:    WBC 11.9 (*)     Hemoglobin 10.5 (*)    HCT 33.3 (*)    MCH 25.9 (*)    RDW 15.9 (*)    All other components within normal limits  DIFFERENTIAL - Abnormal; Notable for the following:    Neutrophils Relative 87 (*)    Neutro Abs 10.4 (*)    Lymphocytes Relative 7 (*)    All other components within normal limits  COMPREHENSIVE METABOLIC PANEL - Abnormal; Notable for the following:    Glucose, Bld 115 (*)    GFR calc non Af Amer 61 (*)    GFR calc Af Amer 71 (*)    All other components within normal limits  LIPASE, BLOOD  URINALYSIS, WITH MICROSCOPIC   No results found.   No diagnosis found.    MDM  The time of my valuation the patient is feeling much better at this time.  She still does have some mild lower abdominal pain and she is at risk for development of a small bowel obstruction.  His may represent partial small bowel obstruction.  CT scan is pending at this time. Care to Dr Denyse Dago, MD 04/29/11 [phone]

## 2011-04-29 NOTE — ED Notes (Signed)
Report received from Patina, Charity fundraiser. Pt denies pain at this time. Up to bathroom.

## 2011-04-29 NOTE — ED Notes (Signed)
Pt has hx of uterine cancer and as of last visit to cancer center the cancer has spread, pt complains of abdominal pain since yesterday

## 2011-04-30 ENCOUNTER — Encounter: Payer: Self-pay | Admitting: Oncology

## 2011-04-30 ENCOUNTER — Inpatient Hospital Stay (HOSPITAL_COMMUNITY): Payer: Medicaid Other

## 2011-04-30 LAB — COMPREHENSIVE METABOLIC PANEL
ALT: 20 U/L (ref 0–35)
AST: 20 U/L (ref 0–37)
Alkaline Phosphatase: 82 U/L (ref 39–117)
CO2: 26 mEq/L (ref 19–32)
Calcium: 8.9 mg/dL (ref 8.4–10.5)
Chloride: 103 mEq/L (ref 96–112)
GFR calc non Af Amer: 54 mL/min — ABNORMAL LOW (ref >90)
Potassium: 4.1 mEq/L (ref 3.5–5.1)
Sodium: 136 mEq/L (ref 135–145)

## 2011-04-30 LAB — URINE CULTURE
Colony Count: NO GROWTH
Culture: NO GROWTH

## 2011-04-30 LAB — CBC
Hemoglobin: 10.5 g/dL — ABNORMAL LOW (ref 12.0–15.0)
MCH: 25.8 pg — ABNORMAL LOW (ref 26.0–34.0)
MCHC: 31.2 g/dL (ref 30.0–36.0)
MCV: 82.8 fL (ref 78.0–100.0)
Platelets: 253 10*3/uL (ref 150–400)
RBC: 4.07 MIL/uL (ref 3.87–5.11)

## 2011-04-30 MED ORDER — ONDANSETRON 8 MG/NS 50 ML IVPB
8.0000 mg | Freq: Three times a day (TID) | INTRAVENOUS | Status: DC | PRN
  Administered 2011-04-30 – 2011-05-12 (×5): 8 mg via INTRAVENOUS
  Filled 2011-04-30 (×7): qty 8

## 2011-04-30 MED ORDER — POLYETHYLENE GLYCOL 3350 17 G PO PACK
17.0000 g | PACK | Freq: Three times a day (TID) | ORAL | Status: DC
  Administered 2011-04-30 – 2011-05-01 (×5): 17 g via ORAL
  Filled 2011-04-30 (×12): qty 1

## 2011-04-30 MED ORDER — ONDANSETRON HCL 8 MG PO TABS
8.0000 mg | ORAL_TABLET | Freq: Three times a day (TID) | ORAL | Status: DC | PRN
  Administered 2011-05-02 – 2011-05-14 (×2): 8 mg via ORAL
  Filled 2011-04-30 (×4): qty 1

## 2011-04-30 MED ORDER — SODIUM CHLORIDE 0.9 % IV SOLN
INTRAVENOUS | Status: DC
  Administered 2011-04-30 – 2011-05-02 (×5): via INTRAVENOUS
  Administered 2011-05-03: 125 mL via INTRAVENOUS
  Administered 2011-05-03: 08:00:00 via INTRAVENOUS
  Administered 2011-05-03 – 2011-05-04 (×2): 1000 mL via INTRAVENOUS
  Administered 2011-05-04: 15:00:00 via INTRAVENOUS

## 2011-04-30 NOTE — Clinical Social Work Psychosocial (Signed)
Clinical Social Work Department BRIEF PSYCHOSOCIAL ASSESSMENT 04/30/2011  Patient:  Krystal [name], Krystal [name]     Account Number:  [id number]     Admit date:  04/29/2011  Clinical Social Worker:  Etta Grandchild  Date/Time:  04/30/2011 11:00 AM  Referred by:  Physician  Date Referred:  04/30/2011 Referred for  Other - See comment   Other Referral:   Emotional Support/grief counseling   Interview type:  Patient Other interview type:   patient's signficant other    PSYCHOSOCIAL DATA Living Status:  FAMILY Admitted from facility:   Level of care:   Primary support name:  Patient's significant other? Primary support relationship to patient:  FRIEND Degree of support available:   poor support. Patient lives with her significant other/father to her two youngest children. Her sister lives in [place] and all family lives in [place].  Patient has reported in the past that she receives support from her friend network in [place].    CURRENT CONCERNS Current Concerns  Adjustment to Illness   Other Concerns:   Metastatic cancer / small children / lack of support    SOCIAL WORK ASSESSMENT / PLAN CSW met with patient and patient's significant other at bedside today to provide emotional support. The patient states she is very sad due to recent progession in disease and has concerns for her children.  She stated she has less than 2 months to live and purpose of treatment was to give her more time. CSW validated patient's feelings and encouraged patient to share her greatest fears at this time. Patient wishes to tell her children today, CSW will provide patient support in discussion with children. CSW will also educate patient on age appropriate conversations relating to cancer.  CSW discussed with MD, MD plans to discuss patient's prognosis further to ensure patient understands at this time. CSW will follow up with patient and family this afternoon.   Assessment/plan status:  Psychosocial  Support/Ongoing Assessment of Needs Other assessment/ plan:   Information/referral to community resources:   [facility]    PATIENT'S/FAMILY'S RESPONSE TO PLAN OF CARE: Patient very appreciative of CSW visit and has requested CSW return this afternoon when children are present. Patient also wishes that her mother would be able to obtain a VISA to visit. CSW can provide assistance as necessary. At this time, patient's significant other is providing care for the children while Ms. Lawniczak is in the hospital.

## 2011-04-30 NOTE — Progress Notes (Signed)
  04/30/2011, 10:02 AM  Hospital day: 2 Antibiotics: none Chemotherapy: day 4 cycle 1 CDDP/adriamycin  Subjective: Abdominal pain has resolved, last pain medication given at 1600 yesterday. Is having flatus, no BM since admission. KUB this am shows nonobstructive pattern, still lots of stool. She is sipping clear liquids, has had no nausea or vomiting. She did sleep last pm. She has been up to BR and I have encouraged her to walk. She denies shortness of breath, mucositis, dysuria.  Patient is tearful, requesting physician letter to help her mother obtain VISA to come to [place], which I am glad to do. Patient has not seen mother in 13 years and  is extremely concerned about her 3 children if she dies. Objective: Vital signs in last 24 hours: Blood pressure 152/88, pulse 110, temperature 98.5 F (36.9 C), temperature source Oral, resp. rate 16, height 5\' 6"  (1.676 m), weight 205 lb (92.987 kg), last menstrual period 07/02/2010, SpO2 98.00%. Alert, appropriate, emotional but otherwise looks comfortable supine in bed on RA. PAC infusing without difficulty. Oral mucosa moist, a little pale, coated but not clearly thrush. Lungs clear anteriorly. Cor RRR. Abdomen full, quiet, soft, not tender. LE no edema, cords, tenderness.   Intake/Output from previous day: 04/17 0701 - 04/18 0700 In: 1397 [I.V.:1397] Out: 1 [Urine:1] Intake/Output this shift:     Lab Results:  Basename 04/30/11 0530 04/29/11 0326  WBC 5.0 11.9*  HGB 10.5* 10.5*  HCT 33.7* 33.3*  PLT 253 315  WBC dropping likely from recent chemo, neupogen to start today. BMET  Basename 04/30/11 0530 04/29/11 0326  NA 136 139  K 4.1 4.2  CL 103 103  CO2 26 25  GLUCOSE 83 115*  BUN 17 19  CREATININE 1.21* 1.09  CALCIUM 8.9 9.5  Will increase IVF rate and follow BMET in AM.   Studies/Results: 04-30-2011 Clinical Data: Abdominal pain and distention, status post  hysterectomy  ABDOMEN - 1 VIEW  Comparison: CT scan performed  yesterday.  Findings: The previously seen dilated small bowel loops have  resolved. Stool and oral contrast are present throughout the  colon. No pneumoperitoneum. No gross organomegaly.  IMPRESSION:  Normal stool and bowel gas pattern.     Assessment/Plan: 1. Partial SBO symptoms improved and Xray also shows resolution. Would go slowly on PO's, with just clear liquids for now. Will give miralax tid today and minimize narcotics. If stable next 24 hours likely can go home.  2.Progressive metastatic endometrial carcinoma, this within 2 months of completing taxol/carboplatin and just having started second line treatment with cisplatin and adriamycin. Neupogen to begin today. She is already scheduled to see me at [facility] on [facility]04[facility]. Prn Zofran available if any nausea. 3.mild left hydronephrosis by CT, new in last 3 weeks. Slight increase in creatinine today may reflect some dehydration in last 36 hrs. Will increase IVF rate to 125 and follow. Would not do any stenting as yet for the mild left hydro. 4.PAC in  5.difficult social situation: not [place] citizen, no insurance, 3 young daughters, mother in [place]. Letter for Visa as above. I LM for oncology chaplain and SW.  Seini Lannom P

## 2011-04-30 NOTE — Progress Notes (Signed)
INITIAL ADULT NUTRITION ASSESSMENT Date: 04/30/2011   Time: 11:39 AM Reason for Assessment: Nutrition risk   ASSESSMENT: Female 41 y.o.  Dx: Partial small bowel obstruction  Hx:  Past Medical History  Diagnosis Date  . Hypertension     SINCE FIRST PREGNANCY  . Asthma   . Constipation   . History of miscarriage   . Preeclampsia   . Postoperative ileus 07/2010  . Iron deficiency anemia     s/p transfusions [facility]  . Uterine cancer    Related Meds:  Scheduled Meds:   . enoxaparin  40 mg Subcutaneous Q24H  . filgrastim  300 mcg Subcutaneous Daily  . magnesium oxide  400 mg Oral Daily  . polyethylene glycol  17 g Oral TID  . DISCONTD: albuterol  2.5 mg Nebulization Q6H   Continuous Infusions:   . sodium chloride 125 mL/hr at 04/30/11 1054  . DISCONTD: sodium chloride 75 mL/hr at 04/29/11 1350   PRN Meds:.acetaminophen, acetaminophen, albuterol, guaiFENesin-dextromethorphan, HYDROmorphone, ipratropium, LORazepam, ondansetron (ZOFRAN) IV, ondansetron, oxyCODONE, DISCONTD: HYDROmorphone  Ht: 5\' 6"  (167.6 cm)  Wt: 205 lb (92.987 kg)  Ideal Wt: 130 lb % Ideal Wt: 157  Usual Wt: 205 lb % Usual Wt: 100  Body mass index is 33.09 kg/(m^2).  Food/Nutrition Related Hx: Pt with endometrial CA s/p chemotherapy and radiation. Pt admitted with abdominal pain. Pt found to have metastatic disease and partial SBO. Pt with chronic constipation, however typically manages with Miralax. Met with pt who reports eating well, 3 meals/day PTA, and no changes in weight. Pt still c/o nausea, which she thinks was worse after drinking juice this morning, however abdominal pain has resolved. Pt with difficult social situation - Child psychotherapist planning to bring children to see pt today and discuss diagnosis, MD working with pt to get pt's mother who lives out of [place] visit daughter.   CT of abdomen/pelvis on 4/17 showed: Dilated loops of small bowel in the left upper abdomen, compatible  with  at least early/partial small bowel obstruction, possibly on  the basis of adhesions. Additional suspected focal transition in  the left mid abdomen.  Interval development of moderate abdominopelvic ascites with  peritoneal nodularity, suggesting peritoneal disease.  Gastrohepatic and retroperitoneal nodal metastases, as described  above.  Diminished enhancement of the left kidney relative to the right  with mild left hydronephrosis, possibly on the basis of extrinsic  compression on the mid/lower ureter by peritoneal disease.  Midline lower pelvic ventral hernia containing a loop of small  Bowel.  KUB today showed: Normal stool and bowel gas pattern.  Labs:  CMP     Component Value Date/Time   NA 136 04/30/2011 0530   K 4.1 04/30/2011 0530   CL 103 04/30/2011 0530   CO2 26 04/30/2011 0530   GLUCOSE 83 04/30/2011 0530   BUN 17 04/30/2011 0530   CREATININE 1.21* 04/30/2011 0530   CREATININE 0.55 10/17/2009 1300   CALCIUM 8.9 04/30/2011 0530   PROT 6.9 04/30/2011 0530   ALBUMIN 3.2* 04/30/2011 0530   AST 20 04/30/2011 0530   ALT 20 04/30/2011 0530   ALKPHOS 82 04/30/2011 0530   BILITOT 0.4 04/30/2011 0530   GFRNONAA 54* 04/30/2011 0530   GFRAA 63* 04/30/2011 0530    Intake/Output Summary (Last 24 hours) at 04/30/11 1156 Last data filed at 04/30/11 0500  Gross per 24 hour  Intake   1397 ml  Output      1 ml  Net   1396 ml  Last BM - 04/27/11  Diet Order: NPO  IVF:    sodium chloride Last Rate: 125 mL/hr at 04/30/11 1054  DISCONTD: sodium chloride Last Rate: 75 mL/hr at 04/29/11 1350    Estimated Nutritional Needs:   Kcal:1750-2050 Protein:70-90g Fluid:1.7-2L  NUTRITION DIAGNOSIS: -Inadequate oral intake (NI-2.1).  Status: Ongoing  RELATED TO: nausea  AS EVIDENCE BY: pt statement, <25% meal intake  MONITORING/EVALUATION(Goals): Advance diet as tolerated to regular diet.   EDUCATION NEEDS: -No education needs identified at this time  INTERVENTION: Diet advancement  per MD. [facility]. Hopefully as nausea and constipation improve, pt's intake can improve and diet can be advanced. Will monitor.   Dietitian #: [phone]  DOCUMENTATION CODES Per approved criteria  -Obesity Unspecified    Marshall Cork 04/30/2011, 11:39 AM

## 2011-04-30 NOTE — Progress Notes (Signed)
Subjective: Patient states that she feels fine this morning. No nausea vomiting or abdominal pain.  Objective: Weight change: 0 kg (0 lb)  Intake/Output Summary (Last 24 hours) at 04/30/11 1111 Last data filed at 04/30/11 0500  Gross per 24 hour  Intake   1397 ml  Output      1 ml  Net   1396 ml    Filed Vitals:   04/30/11 0555  BP: 152/88  Pulse:   Temp:   Resp:    General: Patient does not seem to be in any acute distress.   HEENT: Head normocephalic atraumatic.  Cardiovascular: Regular rate rhythm.  Lungs: Clear to auscultation bilaterally.  Abdomen: Mildly distended nontender positive bowel sounds with abdominal hernia.  Extremities: No edema   Lab Results: Review  Micro Results: No results found for this or any previous visit (from the past 240 hour(s)).  Studies/Results: Abd 1 View (kub)  04/30/2011  *RADIOLOGY REPORT*  Clinical Data: Abdominal pain and distention, status post hysterectomy  ABDOMEN - 1 VIEW  Comparison: CT scan performed yesterday.  Findings: The previously seen dilated small bowel loops have resolved.  Stool and oral contrast are present throughout the colon.  No pneumoperitoneum.  No gross organomegaly.  IMPRESSION: Normal stool and bowel gas pattern.  Original Report Authenticated By: Brandon Melnick, M.D.   Ct Abdomen Pelvis W Contrast  04/29/2011  **ADDENDUM** CREATED: 04/29/2011 09:14:54  These results were called by telephone on 04/29/2011  at  0915 hours to  Dr. Raeford Razor, who verbally acknowledged these results.  **END ADDENDUM** SIGNED BY: Charline Bills, M.D.    04/29/2011  *RADIOLOGY REPORT*  Clinical Data: Abdominal pain, endometrial cancer diagnosed 06/2010 status post hysterectomy, omentectomy, and bilateral pelvic and para-aortic lymph node dissection, ongoing chemotherapy, prior appendectomy  CT ABDOMEN AND PELVIS WITH CONTRAST  Technique:  Multidetector CT imaging of the abdomen and pelvis was performed following the standard  protocol during bolus administration of intravenous contrast.  Contrast: OMNIPAQUE IOHEXOL 300 MG/ML  SOLN  Comparison: PET CT dated 04/07/2011  Findings: Mild patchy opacities at the lung bases.  Small cysts and too small to characterize lesions in the liver, unchanged.  Spleen, pancreas, and adrenal glands are within normal limits.  Gallbladder is underdistended.  No intrahepatic or extrahepatic ductal dilatation.  Left kidney is notable for mild left hydronephrosis and diminished enhancement relative to the right.  Dilated loops of small bowel in the left upper abdomen.  Relative narrowing in the central abdomen (series 2/image 52, possibly reflecting a focal transition, although distal pelvic loops of bowel are not decompressed (series 2/image 79).  At the time of the study, oral contrast has not yet passed to this area.  Angulation of a loop of small bowel in the central abdomen (series 2/image 52), suggesting adhesions.  Additional transition in the left mid abdomen (series 2/image 57) with decompressed distal loops of small bowel.  Midline lower pelvic ventral hernia containing a loop of small bowel (series 2/image 81).  No evidence of abdominal aortic aneurysm.  1.7 cm short axis gastrohepatic nodal metastasis (series 2/image 24).  Additional smaller retroperitoneal nodes measuring up to 8 mm short axis (series 2/image 47). Prior para-aortic/pelvic lymph node dissection.  Interval development of moderate abdominopelvic ascites with peritoneal nodularity in the left upper abdomen (series 2/image 37), suspicious for peritoneal disease.  Status post hysterectomy.  No adnexal masses.  No ureteral or bladder calculi.  Mid/distal left ureter is narrowed, possibly reflecting extensive  compression.  Bladder is within normal limits.  Visualized osseous structures are within normal limits.  IMPRESSION: Dilated loops of small bowel in the left upper abdomen, compatible with at least early/partial small bowel  obstruction, possibly on the basis of adhesions.  Additional suspected focal transition in the left mid abdomen.  Interval development of moderate abdominopelvic ascites with peritoneal nodularity, suggesting peritoneal disease.  Gastrohepatic and retroperitoneal nodal metastases, as described above.  Diminished enhancement of the left kidney relative to the right with mild left hydronephrosis, possibly on the basis of extrinsic compression on the mid/lower ureter by peritoneal disease.  Midline lower pelvic ventral hernia containing a loop of small bowel. Original Report Authenticated By: Charline Bills, M.D.   Nm Pet Image Initial (pi) Skull Base To Thigh  04/07/2011  *RADIOLOGY REPORT*  Clinical Data: Subsequent treatment strategy for endometrial cancer.  New lung nodules.  Rule out metastasis.  NUCLEAR MEDICINE PET CT SKULL BASE TO THIGH  Technique:  15.7 mCi F-18 FDG was injected intravenously via the right power port.  Full-ring PET imaging was performed from the skull base through the mid-thighs 55  minutes after injection.  CT data was obtained and used for attenuation correction and anatomic localization only.  (This was not acquired as a diagnostic CT examination.)  Fasting Blood Glucose:  89  Patient Weight:  204 pounds.  Comparison: Chest CT of 03/22/2011.  Abdominal pelvic CT of 02/18/2011.  Findings: PET images demonstrate moderate hypermetabolic "brown" fat within the neck.  This mildly degrades evaluation for hypermetabolic lymph nodes.  Hypermetabolic prevascular nodes. These measure up to 1.5 cm and a S.U.V. max of 12.4 on image 69. Central AP window node measures 1.4 cm and a S.U.V. max of 7.6 on image 69. Ill-defined soft tissue density within the anterior mediastinum measures 2.0 x 3.6 cm and a S.U.V. max of 5.7 on image 69.  Does have morphology suggestive of residual thymus or thymus based lesion.  Celiac/gastrohepatic ligament node measures 1.8 cm and a S.U.V. max of 14.2 on image 115.   Left common iliac 8 mm node which measures a S.U.V. max of 8.1 on image 177.  CT images performed for attenuation correction demonstrate area of peripheral calcification within the right frontal temporal lobe. No well-defined underlying mass.  Example image four.  Chest findings deferred to recent diagnostic chest CT. No acute superimposed process.  Small lung nodules described on the prior are below the resolution of PET. No acute findings within the abdomen or pelvis.  Hysterectomy.  Postoperative or radiation induced edema within the pelvis.  IMPRESSION: 1.  Hypermetabolic lymph nodes within the chest, abdomen, and pelvis.  Favored to represent metastatic disease from the patients endometrial cancer.  Anterior mediastinal mildly hypermetabolic soft tissue could represent rebound thymic hyperplasia. 2.  Hypermetabolic "brown" fat within the neck and upper chest. Given this factor, no evidence of cervical disease. 3.  Minimal nonspecific calcification in the periphery of the right frontal/temporal region.  Either dural or possibly related to a small underlying meningioma.  This could be reevaluated on follow- up.  Original Report Authenticated By: Consuello Bossier, M.D.   Medications: Scheduled Meds:   . enoxaparin  40 mg Subcutaneous Q24H  . filgrastim  300 mcg Subcutaneous Daily  . magnesium oxide  400 mg Oral Daily  . polyethylene glycol  17 g Oral TID  . DISCONTD: albuterol  2.5 mg Nebulization Q6H   Continuous Infusions:   . sodium chloride 125 mL/hr at 04/30/11 1054  .  DISCONTD: sodium chloride 75 mL/hr at 04/29/11 1350   PRN Meds:.acetaminophen, acetaminophen, albuterol, guaiFENesin-dextromethorphan, HYDROmorphone, ipratropium, LORazepam, ondansetron (ZOFRAN) IV, ondansetron, oxyCODONE, DISCONTD: HYDROmorphone  Assessment/Plan: Partial small bowel obstruction (04/29/2011) Seem to have resolved. Start patient on clear liquid diet. Lots of stool in the colon. Patient started on MiraLAX per  oncology. Continue IV fluids.    Endometrial ca (11/24/2010)  Per oncology. Start chemotherapy next week.   Incisional hernia (03/19/2011)  stable  Leukocytosis (04/29/2011)  Probably secondary to steroid demargination.   Renal insufficiency/mild hydronephrosis IV fluids increased per oncology    LOS: 1 day   Bertin Inabinet JARRETT 04/30/2011, 11:11 AM

## 2011-05-01 ENCOUNTER — Inpatient Hospital Stay (HOSPITAL_COMMUNITY): Payer: Medicaid Other

## 2011-05-01 DIAGNOSIS — K439 Ventral hernia without obstruction or gangrene: Secondary | ICD-10-CM

## 2011-05-01 LAB — DIFFERENTIAL
Basophils Relative: 0 % (ref 0–1)
Lymphocytes Relative: 6 % — ABNORMAL LOW (ref 12–46)
Lymphs Abs: 0.8 10*3/uL (ref 0.7–4.0)
Monocytes Relative: 0 % — ABNORMAL LOW (ref 3–12)
Neutro Abs: 13.8 10*3/uL — ABNORMAL HIGH (ref 1.7–7.7)
Neutrophils Relative %: 94 % — ABNORMAL HIGH (ref 43–77)

## 2011-05-01 LAB — CBC
Hemoglobin: 9.2 g/dL — ABNORMAL LOW (ref 12.0–15.0)
Hemoglobin: 9.3 g/dL — ABNORMAL LOW (ref 12.0–15.0)
MCH: 25.8 pg — ABNORMAL LOW (ref 26.0–34.0)
MCV: 81.7 fL (ref 78.0–100.0)
RBC: 3.55 MIL/uL — ABNORMAL LOW (ref 3.87–5.11)
RBC: 3.61 MIL/uL — ABNORMAL LOW (ref 3.87–5.11)
WBC: 14.6 10*3/uL — ABNORMAL HIGH (ref 4.0–10.5)

## 2011-05-01 LAB — BASIC METABOLIC PANEL
GFR calc Af Amer: 63 mL/min — ABNORMAL LOW (ref >90)
GFR calc non Af Amer: 55 mL/min — ABNORMAL LOW (ref >90)
Potassium: 3.8 mEq/L (ref 3.5–5.1)
Sodium: 139 mEq/L (ref 135–145)

## 2011-05-01 MED ORDER — BISACODYL 10 MG RE SUPP
10.0000 mg | Freq: Every day | RECTAL | Status: DC | PRN
  Administered 2011-05-01: 10 mg via RECTAL
  Filled 2011-05-01: qty 1

## 2011-05-01 NOTE — Clinical Social Work Note (Signed)
Clinical Social Work met with patient and family multiple times to follow up regarding emergency medical VISA for patient's mother living in the [place] as well as emotional support for the patient and her children.  CSW spoke with [facility] office regarding the VISA and emailed Dr. Precious Reel medical statement to the [place] [employer] in the [place].  CSW will support patient throughout this process as other needs arise.  CSW met with patient, and her three children (Hope, Believe, & Blessing) at bedside.  Additional family members were in the room.  CSW facilitated discussion with children around mother's illness and encouraged children to ask questions.  CSW has arranged for patient's children to be seen by [employer]	[place] counselors.  Patient continues to express her greatest concern at this time is her children.  CSW will support patient and family while in hospital and in outpatient setting.  Kathrin Penner, MSW, [facility] Clinical Social Worker [facility] [phone]

## 2011-05-01 NOTE — Progress Notes (Signed)
Pt is very pleasant, she states the she has no needs at discharge/ meds, or  Home Health NA, but would like HHRN to come. Pt would benefit with HHRN and HHSW. mp

## 2011-05-01 NOTE — Progress Notes (Signed)
Subjective: Patient states that she feels fine this morning. She had a little vomiting this morning while brushing her teeth.  Objective: Weight change:   Intake/Output Summary (Last 24 hours) at 05/01/11 1325 Last data filed at 05/01/11 0600  Gross per 24 hour  Intake 2507.5 ml  Output      1 ml  Net 2506.5 ml    Filed Vitals:   05/01/11 0506  BP: 102/69  Pulse: 87  Temp: 98.8 F (37.1 C)  Resp: 16   General: Patient does not seem to be in any acute distress.   HEENT: Head normocephalic atraumatic.  Cardiovascular: Regular rate rhythm.  Lungs: Clear to auscultation bilaterally.  Abdomen: Mildly distended nontender positive bowel sounds with abdominal hernia.  Extremities: No edema   Lab Results: Review  Micro Results: Recent Results (from the past 240 hour(s))  URINE CULTURE     Status: Normal   Collection Time   04/29/11 10:17 PM      Component Value Range Status Comment   Specimen Description URINE, CLEAN CATCH   Final    Special Requests none   Final    Culture  Setup Time [id number]   Final    Colony Count NO GROWTH   Final    Culture NO GROWTH   Final    Report Status 04/30/2011 FINAL   Final     Studies/Results: Abd 1 View (kub)  04/30/2011  *RADIOLOGY REPORT*  Clinical Data: Abdominal pain and distention, status post hysterectomy  ABDOMEN - 1 VIEW  Comparison: CT scan performed yesterday.  Findings: The previously seen dilated small bowel loops have resolved.  Stool and oral contrast are present throughout the colon.  No pneumoperitoneum.  No gross organomegaly.  IMPRESSION: Normal stool and bowel gas pattern.  Original Report Authenticated By: Brandon Melnick, M.D.   Ct Abdomen Pelvis W Contrast  04/29/2011  **ADDENDUM** CREATED: 04/29/2011 09:14:54  These results were called by telephone on 04/29/2011  at  0915 hours to  Dr. Raeford Razor, who verbally acknowledged these results.  **END ADDENDUM** SIGNED BY: Charline Bills, M.D.    04/29/2011   *RADIOLOGY REPORT*  Clinical Data: Abdominal pain, endometrial cancer diagnosed 06/2010 status post hysterectomy, omentectomy, and bilateral pelvic and para-aortic lymph node dissection, ongoing chemotherapy, prior appendectomy  CT ABDOMEN AND PELVIS WITH CONTRAST  Technique:  Multidetector CT imaging of the abdomen and pelvis was performed following the standard protocol during bolus administration of intravenous contrast.  Contrast: OMNIPAQUE IOHEXOL 300 MG/ML  SOLN  Comparison: PET CT dated 04/07/2011  Findings: Mild patchy opacities at the lung bases.  Small cysts and too small to characterize lesions in the liver, unchanged.  Spleen, pancreas, and adrenal glands are within normal limits.  Gallbladder is underdistended.  No intrahepatic or extrahepatic ductal dilatation.  Left kidney is notable for mild left hydronephrosis and diminished enhancement relative to the right.  Dilated loops of small bowel in the left upper abdomen.  Relative narrowing in the central abdomen (series 2/image 52, possibly reflecting a focal transition, although distal pelvic loops of bowel are not decompressed (series 2/image 79).  At the time of the study, oral contrast has not yet passed to this area.  Angulation of a loop of small bowel in the central abdomen (series 2/image 52), suggesting adhesions.  Additional transition in the left mid abdomen (series 2/image 57) with decompressed distal loops of small bowel.  Midline lower pelvic ventral hernia containing a loop of small bowel (series 2/image  81).  No evidence of abdominal aortic aneurysm.  1.7 cm short axis gastrohepatic nodal metastasis (series 2/image 24).  Additional smaller retroperitoneal nodes measuring up to 8 mm short axis (series 2/image 47). Prior para-aortic/pelvic lymph node dissection.  Interval development of moderate abdominopelvic ascites with peritoneal nodularity in the left upper abdomen (series 2/image 37), suspicious for peritoneal disease.  Status  post hysterectomy.  No adnexal masses.  No ureteral or bladder calculi.  Mid/distal left ureter is narrowed, possibly reflecting extensive compression.  Bladder is within normal limits.  Visualized osseous structures are within normal limits.  IMPRESSION: Dilated loops of small bowel in the left upper abdomen, compatible with at least early/partial small bowel obstruction, possibly on the basis of adhesions.  Additional suspected focal transition in the left mid abdomen.  Interval development of moderate abdominopelvic ascites with peritoneal nodularity, suggesting peritoneal disease.  Gastrohepatic and retroperitoneal nodal metastases, as described above.  Diminished enhancement of the left kidney relative to the right with mild left hydronephrosis, possibly on the basis of extrinsic compression on the mid/lower ureter by peritoneal disease.  Midline lower pelvic ventral hernia containing a loop of small bowel. Original Report Authenticated By: Charline Bills, M.D.   Nm Pet Image Initial (pi) Skull Base To Thigh  04/07/2011  *RADIOLOGY REPORT*  Clinical Data: Subsequent treatment strategy for endometrial cancer.  New lung nodules.  Rule out metastasis.  NUCLEAR MEDICINE PET CT SKULL BASE TO THIGH  Technique:  15.7 mCi F-18 FDG was injected intravenously via the right power port.  Full-ring PET imaging was performed from the skull base through the mid-thighs 55  minutes after injection.  CT data was obtained and used for attenuation correction and anatomic localization only.  (This was not acquired as a diagnostic CT examination.)  Fasting Blood Glucose:  89  Patient Weight:  204 pounds.  Comparison: Chest CT of 03/22/2011.  Abdominal pelvic CT of 02/18/2011.  Findings: PET images demonstrate moderate hypermetabolic "brown" fat within the neck.  This mildly degrades evaluation for hypermetabolic lymph nodes.  Hypermetabolic prevascular nodes. These measure up to 1.5 cm and a S.U.V. max of 12.4 on image 69.  Central AP window node measures 1.4 cm and a S.U.V. max of 7.6 on image 69. Ill-defined soft tissue density within the anterior mediastinum measures 2.0 x 3.6 cm and a S.U.V. max of 5.7 on image 69.  Does have morphology suggestive of residual thymus or thymus based lesion.  Celiac/gastrohepatic ligament node measures 1.8 cm and a S.U.V. max of 14.2 on image 115.  Left common iliac 8 mm node which measures a S.U.V. max of 8.1 on image 177.  CT images performed for attenuation correction demonstrate area of peripheral calcification within the right frontal temporal lobe. No well-defined underlying mass.  Example image four.  Chest findings deferred to recent diagnostic chest CT. No acute superimposed process.  Small lung nodules described on the prior are below the resolution of PET. No acute findings within the abdomen or pelvis.  Hysterectomy.  Postoperative or radiation induced edema within the pelvis.  IMPRESSION: 1.  Hypermetabolic lymph nodes within the chest, abdomen, and pelvis.  Favored to represent metastatic disease from the patients endometrial cancer.  Anterior mediastinal mildly hypermetabolic soft tissue could represent rebound thymic hyperplasia. 2.  Hypermetabolic "brown" fat within the neck and upper chest. Given this factor, no evidence of cervical disease. 3.  Minimal nonspecific calcification in the periphery of the right frontal/temporal region.  Either dural or possibly related to  a small underlying meningioma.  This could be reevaluated on follow- up.  Original Report Authenticated By: Consuello Bossier, M.D.   Medications: Scheduled Meds:   . enoxaparin  40 mg Subcutaneous Q24H  . filgrastim  300 mcg Subcutaneous Daily  . magnesium oxide  400 mg Oral Daily  . polyethylene glycol  17 g Oral TID  . DISCONTD: albuterol  2.5 mg Nebulization Q6H   Continuous Infusions:   . sodium chloride 125 mL/hr at 04/30/11 1054  . DISCONTD: sodium chloride 75 mL/hr at 04/29/11 1350   PRN  Meds:.acetaminophen, acetaminophen, albuterol, bisacodyl, guaiFENesin-dextromethorphan, HYDROmorphone, ipratropium, LORazepam, ondansetron (ZOFRAN) IV, ondansetron, oxyCODONE  Assessment/Plan: Partial small bowel obstruction (04/29/2011) Seem to have Resolved. Patient is on clear liquid diet.  Continue MiraLAX, Continue IV fluids. Vomiting most likely related to chemotherapy per discussion with Oncology    Endometrial ca (11/24/2010)  Per Oncology. Start Chemotherapy next week.   Incisional Hernia (03/19/2011)  stable  Leukocytosis (04/29/2011) Probably secondary to steroid demargination.  Renal insufficiency/Mild Hydronephrosis IV fluids increased per Oncology.  Continue IV fluids  Disposition: Will defer to Oncology.    LOS: 2 days   Carollee Massed Pager [phone] 05/01/2011, 1:25 PM

## 2011-05-01 NOTE — Progress Notes (Signed)
  05/01/2011, 1:10 PM  Hospital day: 3 Antibiotics: none  Chemotherapy: day 5 cycle 1 CDDP/adriamycin  Subjective: More abdominal discomfort and nausea with vomiting x1 this am. No bowel movement since admission tho is passing some gas. Able to take miralax yesterday and tolerated clear liquids also yesterday, walked in hall, able to sleep after ativan last pm. Significant other at bedside.  Objective: Vital signs in last 24 hours: Blood pressure 102/69, pulse 87, temperature 98.8 F (37.1 C), temperature source Oral, resp. rate 16, height 5\' 6"  (1.676 m), weight 205 lb (92.987 kg), last menstrual period 07/02/2010, SpO2 94.00%.  IV NS at 125 cc/hr   Physical exam: Awake, alert, complaining of nausea and abdominal discomfort. No alopecia. Oral mucosa moist and clear. PAC infusing without difficulty. PERRL. Lungs without wheezes or rales. Cor RRR, clear heart sounds. Abdomen distended but soft, quiet, ventral hernia reduces, minimally tender diffusely. LE without edema, cords, tenderness. Moves easily in bed.  Lab Results:  Basename 05/01/11 0521 04/30/11 0530  WBC 14.6* 5.0  HGB 9.2* 10.5*  HCT 29.4* 33.7*  PLT 202 253   BMET  Basename 05/01/11 0521 04/30/11 0530  NA 139 136  K 3.8 4.1  CL 105 103  CO2 26 26  GLUCOSE 82 83  BUN 16 17  CREATININE 1.20* 1.21*  CALCIUM 8.6 8.9    Studies/Results: Dg Abd 1 View  05/01/2011  *RADIOLOGY REPORT*  Clinical Data: Recent onset of abdominal pain with nausea and vomiting.  History of hysterectomy 10 months ago for uterine cancer.  ABDOMEN - 1 VIEW  Comparison: Abdominal radiographs 04/30/2011.  CT 04/29/2011.  Findings: There are moderately dilated loops of small bowel in the midabdomen as demonstrated on recent CT.  Contrast has passed into the colon which is normal in caliber.  There is no supine evidence of free intraperitoneal air.  There is persistent evidence of ascites as seen on CT.  Surgical clips are present within the right  pelvis.  IMPRESSION: Recurrent small bowel dilatation as demonstrated on recent CT and concerning for partial small-bowel obstruction, possibly on the basis of adhesions or peritoneal carcinomatosis.  Original Report Authenticated By: Gerrianne Scale, M.D.   Abd 1 View (kub)  04/30/2011  *RADIOLOGY REPORT*  Clinical Data: Abdominal pain and distention, status post hysterectomy  ABDOMEN - 1 VIEW  Comparison: CT scan performed yesterday.  Findings: The previously seen dilated small bowel loops have resolved.  Stool and oral contrast are present throughout the colon.  No pneumoperitoneum.  No gross organomegaly.  IMPRESSION: Normal stool and bowel gas pattern.  Original Report Authenticated By: Brandon Melnick, M.D.   Discussed with hospitalist on unit.  Assessment/Plan: 1.Progressive endometrial carcinoma: second-line chemotherapy with CDDP/adria begun on 04-27-2011. Neupogen begun 4-18 and would give at least thru 4-20 depending on ANC and amount of aching. Nausea just from this chemo is likely. 2.recurrent symptoms of at least partial SBO today: would not try more than present clear liquids and may need to go back to NPO depending on symptoms. Minimize narcotics as possible, but may need to use if significant pain. 3.ventral abdominal hernia 4.PAC in 5.difficult social situation: not [place] citizen, 3 very young daughters My partners will follow also this weekend. Thanks -  Rainn Bullinger P

## 2011-05-02 MED ORDER — PROMETHAZINE HCL 25 MG/ML IJ SOLN
12.5000 mg | Freq: Once | INTRAMUSCULAR | Status: AC
  Administered 2011-05-03: 12.5 mg via INTRAVENOUS
  Filled 2011-05-02: qty 1

## 2011-05-02 MED ORDER — PROMETHAZINE HCL 25 MG/ML IJ SOLN: 12.5000 mg | Freq: Four times a day (QID) | INTRAMUSCULAR | Status: DC | PRN

## 2011-05-02 NOTE — Progress Notes (Signed)
Subjective: Patient with nausea and vomiting overnight.  NG tube placed per oncology.  Objective: Weight change:   Intake/Output Summary (Last 24 hours) at 05/01/11 1325 Last data filed at 05/01/11 0600  Gross per 24 hour  Intake 2507.5 ml  Output      1 ml  Net 2506.5 ml    Filed Vitals:   05/01/11 0506  BP: 102/69  Pulse: 87  Temp: 98.8 F (37.1 C)  Resp: 16   General: Patient does not seem to be in any acute distress.   HEENT: Head normocephalic atraumatic.  Cardiovascular: Regular rate rhythm.  Lungs: Clear to auscultation bilaterally.  Abdomen: Mildly distended nontender minimal bowel sounds; positive for abdominal hernia.  Extremities: No edema   Lab Results: Review  Micro Results: Recent Results (from the past 240 hour(s))  URINE CULTURE     Status: Normal   Collection Time   04/29/11 10:17 PM      Component Value Range Status Comment   Specimen Description URINE, CLEAN CATCH   Final    Special Requests none   Final    Culture  Setup Time [id number]   Final    Colony Count NO GROWTH   Final    Culture NO GROWTH   Final    Report Status 04/30/2011 FINAL   Final     Studies/Results: Abd 1 View (kub)  04/30/2011  *RADIOLOGY REPORT*  Clinical Data: Abdominal pain and distention, status post hysterectomy  ABDOMEN - 1 VIEW  Comparison: CT scan performed yesterday.  Findings: The previously seen dilated small bowel loops have resolved.  Stool and oral contrast are present throughout the colon.  No pneumoperitoneum.  No gross organomegaly.  IMPRESSION: Normal stool and bowel gas pattern.  Original Report Authenticated By: Brandon Melnick, M.D.   Ct Abdomen Pelvis W Contrast  04/29/2011  **ADDENDUM** CREATED: 04/29/2011 09:14:54  These results were called by telephone on 04/29/2011  at  0915 hours to  Dr. Raeford Razor, who verbally acknowledged these results.  **END ADDENDUM** SIGNED BY: Charline Bills, M.D.    04/29/2011  *RADIOLOGY REPORT*  Clinical Data:  Abdominal pain, endometrial cancer diagnosed 06/2010 status post hysterectomy, omentectomy, and bilateral pelvic and para-aortic lymph node dissection, ongoing chemotherapy, prior appendectomy  CT ABDOMEN AND PELVIS WITH CONTRAST  Technique:  Multidetector CT imaging of the abdomen and pelvis was performed following the standard protocol during bolus administration of intravenous contrast.  Contrast: OMNIPAQUE IOHEXOL 300 MG/ML  SOLN  Comparison: PET CT dated 04/07/2011  Findings: Mild patchy opacities at the lung bases.  Small cysts and too small to characterize lesions in the liver, unchanged.  Spleen, pancreas, and adrenal glands are within normal limits.  Gallbladder is underdistended.  No intrahepatic or extrahepatic ductal dilatation.  Left kidney is notable for mild left hydronephrosis and diminished enhancement relative to the right.  Dilated loops of small bowel in the left upper abdomen.  Relative narrowing in the central abdomen (series 2/image 52, possibly reflecting a focal transition, although distal pelvic loops of bowel are not decompressed (series 2/image 79).  At the time of the study, oral contrast has not yet passed to this area.  Angulation of a loop of small bowel in the central abdomen (series 2/image 52), suggesting adhesions.  Additional transition in the left mid abdomen (series 2/image 57) with decompressed distal loops of small bowel.  Midline lower pelvic ventral hernia containing a loop of small bowel (series 2/image 81).  No evidence of abdominal  aortic aneurysm.  1.7 cm short axis gastrohepatic nodal metastasis (series 2/image 24).  Additional smaller retroperitoneal nodes measuring up to 8 mm short axis (series 2/image 47). Prior para-aortic/pelvic lymph node dissection.  Interval development of moderate abdominopelvic ascites with peritoneal nodularity in the left upper abdomen (series 2/image 37), suspicious for peritoneal disease.  Status post hysterectomy.  No adnexal  masses.  No ureteral or bladder calculi.  Mid/distal left ureter is narrowed, possibly reflecting extensive compression.  Bladder is within normal limits.  Visualized osseous structures are within normal limits.  IMPRESSION: Dilated loops of small bowel in the left upper abdomen, compatible with at least early/partial small bowel obstruction, possibly on the basis of adhesions.  Additional suspected focal transition in the left mid abdomen.  Interval development of moderate abdominopelvic ascites with peritoneal nodularity, suggesting peritoneal disease.  Gastrohepatic and retroperitoneal nodal metastases, as described above.  Diminished enhancement of the left kidney relative to the right with mild left hydronephrosis, possibly on the basis of extrinsic compression on the mid/lower ureter by peritoneal disease.  Midline lower pelvic ventral hernia containing a loop of small bowel. Original Report Authenticated By: Charline Bills, M.D.   Nm Pet Image Initial (pi) Skull Base To Thigh  04/07/2011  *RADIOLOGY REPORT*  Clinical Data: Subsequent treatment strategy for endometrial cancer.  New lung nodules.  Rule out metastasis.  NUCLEAR MEDICINE PET CT SKULL BASE TO THIGH  Technique:  15.7 mCi F-18 FDG was injected intravenously via the right power port.  Full-ring PET imaging was performed from the skull base through the mid-thighs 55  minutes after injection.  CT data was obtained and used for attenuation correction and anatomic localization only.  (This was not acquired as a diagnostic CT examination.)  Fasting Blood Glucose:  89  Patient Weight:  204 pounds.  Comparison: Chest CT of 03/22/2011.  Abdominal pelvic CT of 02/18/2011.  Findings: PET images demonstrate moderate hypermetabolic "brown" fat within the neck.  This mildly degrades evaluation for hypermetabolic lymph nodes.  Hypermetabolic prevascular nodes. These measure up to 1.5 cm and a S.U.V. max of 12.4 on image 69. Central AP window node measures 1.4  cm and a S.U.V. max of 7.6 on image 69. Ill-defined soft tissue density within the anterior mediastinum measures 2.0 x 3.6 cm and a S.U.V. max of 5.7 on image 69.  Does have morphology suggestive of residual thymus or thymus based lesion.  Celiac/gastrohepatic ligament node measures 1.8 cm and a S.U.V. max of 14.2 on image 115.  Left common iliac 8 mm node which measures a S.U.V. max of 8.1 on image 177.  CT images performed for attenuation correction demonstrate area of peripheral calcification within the right frontal temporal lobe. No well-defined underlying mass.  Example image four.  Chest findings deferred to recent diagnostic chest CT. No acute superimposed process.  Small lung nodules described on the prior are below the resolution of PET. No acute findings within the abdomen or pelvis.  Hysterectomy.  Postoperative or radiation induced edema within the pelvis.  IMPRESSION: 1.  Hypermetabolic lymph nodes within the chest, abdomen, and pelvis.  Favored to represent metastatic disease from the patients endometrial cancer.  Anterior mediastinal mildly hypermetabolic soft tissue could represent rebound thymic hyperplasia. 2.  Hypermetabolic "brown" fat within the neck and upper chest. Given this factor, no evidence of cervical disease. 3.  Minimal nonspecific calcification in the periphery of the right frontal/temporal region.  Either dural or possibly related to a small underlying meningioma.  This  could be reevaluated on follow- up.  Original Report Authenticated By: Consuello Bossier, M.D.   Medications: Scheduled Meds:   . enoxaparin  40 mg Subcutaneous Q24H  . filgrastim  300 mcg Subcutaneous Daily  . magnesium oxide  400 mg Oral Daily  . polyethylene glycol  17 g Oral TID  . DISCONTD: albuterol  2.5 mg Nebulization Q6H   Continuous Infusions:   . sodium chloride 125 mL/hr at 04/30/11 1054  . DISCONTD: sodium chloride 75 mL/hr at 04/29/11 1350   PRN Meds:.acetaminophen, acetaminophen,  albuterol, bisacodyl, guaiFENesin-dextromethorphan, HYDROmorphone, ipratropium, LORazepam, ondansetron (ZOFRAN) IV, ondansetron, oxyCODONE, DISCONTD: promethazine  Assessment/Plan: Partial small bowel obstruction (04/29/2011) Patient now with nausea vomiting. Will resume bowel rest; NG tube placed ; repeat KUB in the morning.   Endometrial ca (11/24/2010)  Per Oncology. Start Chemotherapy next week.   Incisional Hernia (03/19/2011)  stable  Leukocytosis (04/29/2011) Probably secondary to steroid demargination.  Renal insufficiency/Mild Hydronephrosis IV fluids increased per Oncology.  Continue IV fluids  Disposition: Will defer to Oncology.    LOS: 3 days   Carollee Massed Pager [phone] 05/02/2011, 2:02 PM

## 2011-05-02 NOTE — Progress Notes (Signed)
Subjective: The patient is seen and examined today. She is to have nausea and abdominal distention as well as tenderness. She vomited a few times yesterday. She denied having any significant fever or chills, no chest pain or shortness of breath.  Objective: Vital signs in last 24 hours: Temp:  [99.5 F (37.5 C)-100.3 F (37.9 C)] 99.5 F (37.5 C) (04/20 0520) Pulse Rate:  [95-97] 95  (04/20 0520) Resp:  [16] 16  (04/20 0520) BP: (126-155)/(77-78) 126/77 mmHg (04/20 0520) SpO2:  [92 %-100 %] 92 % (04/20 0520)  Intake/Output from previous day: 04/19 0701 - 04/20 0700 In: 1250 [I.V.:1250] Out: 800 [Emesis/NG output:800] Intake/Output this shift: Total I/O In: 1972 [I.V.:1972] Out: -   General appearance: alert, cooperative, fatigued and unconfortable. Resp: clear to auscultation bilaterally Cardio: regular rate and rhythm, S1, S2 normal, no murmur, click, rub or gallop GI: Tender to palpation with distention and hypoactive bowel sounds. Extremities: extremities normal, atraumatic, no cyanosis or edema  Lab Results:   [facility] 05/01/11 1530 05/01/11 0521  WBC 15.3* 14.6*  HGB 9.3* 9.2*  HCT 29.5* 29.4*  PLT 225 202   BMET  Basename 05/01/11 0521 04/30/11 0530  NA 139 136  K 3.8 4.1  CL 105 103  CO2 26 26  GLUCOSE 82 83  BUN 16 17  CREATININE 1.20* 1.21*  CALCIUM 8.6 8.9    Studies/Results: Dg Abd 1 View  05/01/2011  *RADIOLOGY REPORT*  Clinical Data: Recent onset of abdominal pain with nausea and vomiting.  History of hysterectomy 10 months ago for uterine cancer.  ABDOMEN - 1 VIEW  Comparison: Abdominal radiographs 04/30/2011.  CT 04/29/2011.  Findings: There are moderately dilated loops of small bowel in the midabdomen as demonstrated on recent CT.  Contrast has passed into the colon which is normal in caliber.  There is no supine evidence of free intraperitoneal air.  There is persistent evidence of ascites as seen on CT.  Surgical clips are present within the right  pelvis.  IMPRESSION: Recurrent small bowel dilatation as demonstrated on recent CT and concerning for partial small-bowel obstruction, possibly on the basis of adhesions or peritoneal carcinomatosis.  Original Report Authenticated By: Gerrianne Scale, M.D.    Medications: I have reviewed the patient's current medications.  Assessment/Plan: #1 progressive endometrial carcinoma status post treatment with carboplatin and paclitaxel with disease progression. She is currently on systemic therapy with cisplatin and Adriamycin. #2 small bowel obstruction: I would consider the patient for NG tube suction for bowel rest. She agreed. Will keep her n.p.o. for now. Continue IV fluid.    LOS: 3 days    Lylliana Kitamura K. 05/02/2011

## 2011-05-03 ENCOUNTER — Inpatient Hospital Stay (HOSPITAL_COMMUNITY): Payer: Medicaid Other

## 2011-05-03 DIAGNOSIS — K56609 Unspecified intestinal obstruction, unspecified as to partial versus complete obstruction: Secondary | ICD-10-CM

## 2011-05-03 DIAGNOSIS — Z86718 Personal history of other venous thrombosis and embolism: Secondary | ICD-10-CM

## 2011-05-03 LAB — URINALYSIS, ROUTINE W REFLEX MICROSCOPIC
Bilirubin Urine: NEGATIVE
Hgb urine dipstick: NEGATIVE
Specific Gravity, Urine: 1.015 (ref 1.005–1.030)
Urobilinogen, UA: 1 mg/dL (ref 0.0–1.0)

## 2011-05-03 LAB — CBC
HCT: 27.6 % — ABNORMAL LOW (ref 36.0–46.0)
Hemoglobin: 8.5 g/dL — ABNORMAL LOW (ref 12.0–15.0)
Hemoglobin: 8.5 g/dL — ABNORMAL LOW (ref 12.0–15.0)
MCH: 25.4 pg — ABNORMAL LOW (ref 26.0–34.0)
MCHC: 30.8 g/dL (ref 30.0–36.0)
MCHC: 31.4 g/dL (ref 30.0–36.0)
MCV: 82.6 fL (ref 78.0–100.0)
Platelets: 178 10*3/uL (ref 150–400)
RDW: 15.5 % (ref 11.5–15.5)

## 2011-05-03 LAB — BASIC METABOLIC PANEL
BUN: 12 mg/dL (ref 6–23)
GFR calc non Af Amer: 71 mL/min — ABNORMAL LOW (ref >90)
Glucose, Bld: 65 mg/dL — ABNORMAL LOW (ref 70–99)
Potassium: 3.6 mEq/L (ref 3.5–5.1)

## 2011-05-03 LAB — DIFFERENTIAL
Basophils Absolute: 0 10*3/uL (ref 0.0–0.1)
Eosinophils Absolute: 0 10*3/uL (ref 0.0–0.7)
Eosinophils Absolute: 0 10*3/uL (ref 0.0–0.7)
Lymphocytes Relative: 17 % (ref 12–46)
Lymphs Abs: 0.5 10*3/uL — ABNORMAL LOW (ref 0.7–4.0)
Monocytes Relative: 1 % — ABNORMAL LOW (ref 3–12)
Neutrophils Relative %: 90 % — ABNORMAL HIGH (ref 43–77)
Neutrophils Relative %: 81 % — ABNORMAL HIGH (ref 43–77)

## 2011-05-03 MED ORDER — HYDRALAZINE HCL 20 MG/ML IJ SOLN
5.0000 mg | Freq: Once | INTRAMUSCULAR | Status: AC
  Administered 2011-05-03: 5 mg via INTRAVENOUS
  Filled 2011-05-03: qty 1

## 2011-05-03 MED ORDER — FILGRASTIM 300 MCG/ML IJ SOLN
300.0000 ug | Freq: Once | INTRAMUSCULAR | Status: AC
  Administered 2011-05-03: 300 ug via SUBCUTANEOUS
  Filled 2011-05-03: qty 1

## 2011-05-03 MED ORDER — ACETAMINOPHEN 650 MG RE SUPP
650.0000 mg | Freq: Once | RECTAL | Status: AC
  Administered 2011-05-03: 650 mg via RECTAL
  Filled 2011-05-03: qty 1

## 2011-05-03 NOTE — Progress Notes (Signed)
IP PROGRESS NOTE  Subjective:   She complains of abdominal pain. No bowel movement.  Objective: Vital signs in last 24 hours: Blood pressure 146/90, pulse 100, temperature 99.7 F (37.6 C), temperature source Oral, resp. rate 16, height 5\' 6"  (1.676 m), weight 205 lb (92.987 kg), last menstrual period 07/02/2010, SpO2 95.00%.  Intake/Output from previous day: 04/20 0701 - 04/21 0700 In: 4775 [I.V.:4775] Out: 351 [Urine:1; Emesis/NG output:350]  Physical Exam:  HEENT: No thrush, in G-tube in place Lungs: End inspiratory wheeze at the left posterior chest, no respiratory distress Cardiac: Regular rate and rhythm Abdomen: Distended, bowel sounds are present Extremities: No leg edema  Portacath/PICC-without erythema  Lab Results:  [facility] 05/01/11 1530 05/01/11 0521  WBC 15.3* 14.6*  HGB 9.3* 9.2*  HCT 29.5* 29.4*  PLT 225 202    BMET  Basename 05/01/11 0521  NA 139  K 3.8  CL 105  CO2 26  GLUCOSE 82  BUN 16  CREATININE 1.20*  CALCIUM 8.6    Studies/Results: Dg Abd 1 View  05/01/2011  *RADIOLOGY REPORT*  Clinical Data: Recent onset of abdominal pain with nausea and vomiting.  History of hysterectomy 10 months ago for uterine cancer.  ABDOMEN - 1 VIEW  Comparison: Abdominal radiographs 04/30/2011.  CT 04/29/2011.  Findings: There are moderately dilated loops of small bowel in the midabdomen as demonstrated on recent CT.  Contrast has passed into the colon which is normal in caliber.  There is no supine evidence of free intraperitoneal air.  There is persistent evidence of ascites as seen on CT.  Surgical clips are present within the right pelvis.  IMPRESSION: Recurrent small bowel dilatation as demonstrated on recent CT and concerning for partial small-bowel obstruction, possibly on the basis of adhesions or peritoneal carcinomatosis.  Original Report Authenticated By: Gerrianne Scale, M.D.    Medications: I have reviewed the patient's current  medications.  Assessment/Plan: 1. Endometrial carcinoma, status post cisplatin/Adriamycin 04/27/2011 with Neupogen support, okay to discontinue Neupogen if the white count remains elevated today 2. Small bowel obstruction-NG tube in place, consider surgical consultation if her symptoms do not improve. Followup repeat abdominal x-ray today. 3. DVT prophylaxis-Lovenox   LOS: 4 days   Krystal [name]  05/03/2011, 9:02 AM

## 2011-05-03 NOTE — Consult Note (Signed)
Reason for Consult:abdominal pain and distension Referring Physician: [name] is an 41 y.o. female.  HPI: consult on this patient to evaluate for abdominal pain and distention. She has had 5 days of abdominal pain and distention and some nausea and vomiting. She was admitted through the emergency room with the diagnosis of small bowel obstruction. She has a history of poorly differentiated and locally advanced endometrial cancer and has undergone debulking and hysterectomy at [facility]. She is receiving chemotherapy and recently had a PET scan which was concerning for possible metastatic disease. She has a history of some chronic constipation and takes laxatives for this but recently had associated nausea and vomiting as well. Since she has been in the hospital she had originally improved was some bowel rest and her diet was advanced but she had subsequent distention and nausea and vomiting an NG tube was placed yesterday. She had some relief but states that her distention is similar to yesterday. She has crampy abdominal pain and her central abdomen but no fevers or chills. She has been passing some gas today and even had a bowel movement but she still has some nausea even around the NG tube.  Past Medical History  Diagnosis Date  . Hypertension     SINCE FIRST PREGNANCY  . Asthma   . Constipation   . History of miscarriage   . Preeclampsia   . Postoperative ileus 07/2010  . Iron deficiency anemia     s/p transfusions [facility]  . Uterine cancer     Past Surgical History  Procedure Date  . Cesarean section   . Abdominal hysterectomy 07-08-10     TAH-BSO, WITH OMENTECTOMY AND BILAT. PELVIC AND PERIAORTIC LYMPH NODE DISSECTION  . Appendectomy     IN CHILDHOOD    Family History  Problem Relation Age of Onset  . Diabetes Mother   . Hypertension Mother   . Hypertension Father     Social History:  reports that she has never smoked. She has never used smokeless tobacco. She reports  that she drinks alcohol. She reports that she does not use illicit drugs.  Allergies: No Known Allergies  Medications: I have reviewed the patient's current medications.  Results for orders placed during the hospital encounter of 04/29/11 (from the past 48 hour(s))  DIFFERENTIAL     Status: Abnormal   Collection Time   05/03/11  2:00 PM      Component Value Range Comment   Neutrophils Relative 90 (*) 43 - 77 (%)    Neutro Abs 4.8  1.7 - 7.7 (K/uL)    Lymphocytes Relative 9 (*) 12 - 46 (%)    Lymphs Abs 0.5 (*) 0.7 - 4.0 (K/uL)    Monocytes Relative 1 (*) 3 - 12 (%)    Monocytes Absolute 0.1  0.1 - 1.0 (K/uL)    Eosinophils Relative 0  0 - 5 (%)    Eosinophils Absolute 0.0  0.0 - 0.7 (K/uL)    Basophils Relative 0  0 - 1 (%)    Basophils Absolute 0.0  0.0 - 0.1 (K/uL)   BASIC METABOLIC PANEL     Status: Abnormal   Collection Time   05/03/11  2:00 PM      Component Value Range Comment   Sodium 141  135 - 145 (mEq/L)    Potassium 3.6  3.5 - 5.1 (mEq/L)    Chloride 105  96 - 112 (mEq/L)    CO2 21  19 - 32 (mEq/L)  Glucose, Bld 65 (*) 70 - 99 (mg/dL)    BUN 12  6 - 23 (mg/dL)    Creatinine, Ser 1.61  0.50 - 1.10 (mg/dL)    Calcium 8.9  8.4 - 10.5 (mg/dL)    GFR calc non Af Amer 71 (*) >90 (mL/min)    GFR calc Af Amer 82 (*) >90 (mL/min)   CBC     Status: Abnormal   Collection Time   05/03/11  2:00 PM      Component Value Range Comment   WBC 5.3  4.0 - 10.5 (K/uL)    RBC 3.34 (*) 3.87 - 5.11 (MIL/uL)    Hemoglobin 8.5 (*) 12.0 - 15.0 (g/dL)    HCT 09.6 (*) 04.5 - 46.0 (%)    MCV 82.6  78.0 - 100.0 (fL)    MCH 25.4 (*) 26.0 - 34.0 (pg)    MCHC 30.8  30.0 - 36.0 (g/dL)    RDW 40.9 (*) 81.1 - 15.5 (%)    Platelets 193  150 - 400 (K/uL)     Dg Abd 1 View  05/03/2011  *RADIOLOGY REPORT*  Clinical Data: Evaluate possible small bowel obstruction. Distension and abdominal pain.  ABDOMEN - 1 VIEW  Comparison: 05/01/2011  Findings: Nasogastric tube is in place, tip overlying the  level of the stomach.  There is residual contrast within colonic loops. There is persistent dilatation of central small bowel loops.  No evidence for free intraperitoneal air on these supine views.  IMPRESSION:  1.  Persistent retained contrast.  Last contrast exam 04/29/2011. 2.  Persistent dilatation of small bowel loops, consistent with at least partial small bowel obstruction.  Original Report Authenticated By: Patterson Hammersmith, M.D.     Blood pressure 148/94, pulse 96, temperature 99.5 F (37.5 C), temperature source Oral, resp. rate 16, height 5\' 6"  (1.676 m), weight 205 lb (92.987 kg), last menstrual period 07/02/2010, SpO2 95.00%. General appearance: alert, cooperative and no distress Resp: nonlabored, no wheezing Cardio: normal rate, regular GI: soft, very distended, with small midline ventral hernia which is reducible, minimal diffuse tenderness, no peritoneal signs  Assessment/Plan: advanced endometrial cancer with abdominal pain and distention likely due to malignant bowel obstruction or partial bowel obstruction This is certainly a very difficult situation because she likely does have a malignant bowel obstruction. She has no peritonitis and no evidence of the need for emergent exploration. I doubt that her ventral hernia is the cause of this stenosis appears to be reducible  On exam and there was no evidence of obstruction on CT scan. This may be adhesive bowel obstruction as well. Some of her distention may be due to her ascites. I discussed with her the possibilities for surgery including the possibility of bowel resection, diversion, or possible gastrostomy tube for ongoing decompression.  I think the best plan would be to have a conference with her admitting physician, and her oncologist, and surgery, as well as her GYN oncologist to discuss a treatment plan for her. Certainly, if we could avoid any surgery in this patient, I think that this would be in her best interest since this  would not likely be curative for her.  Krystal [name] Krystal [name] 05/03/2011, 5:36 PM

## 2011-05-03 NOTE — Progress Notes (Signed)
Subjective: Patient with NG tube in place.  Complains of some abdominal pain this morning.  Objective: Weight change:   Intake/Output Summary (Last 24 hours) at 05/01/11 1325 Last data filed at 05/01/11 0600  Gross per 24 hour  Intake 2507.5 ml  Output      1 ml  Net 2506.5 ml    Filed Vitals:   05/01/11 0506  BP: 102/69  Pulse: 87  Temp: 98.8 F (37.1 C)  Resp: 16   General: Patient does not seem to be in any acute distress.   HEENT: Head normocephalic atraumatic.  Cardiovascular: Regular rate rhythm.  Lungs: Clear to auscultation bilaterally.  Abdomen: Mildly distended diffuse minimal tenderness, minimal bowel sounds; positive for abdominal hernia.  Extremities: No edema   Lab Results: Review  Micro Results: Recent Results (from the past 240 hour(s))  URINE CULTURE     Status: Normal   Collection Time   04/29/11 10:17 PM      Component Value Range Status Comment   Specimen Description URINE, CLEAN CATCH   Final    Special Requests none   Final    Culture  Setup Time [id number]   Final    Colony Count NO GROWTH   Final    Culture NO GROWTH   Final    Report Status 04/30/2011 FINAL   Final     Studies/Results: Abd 1 View (kub)  04/30/2011  *RADIOLOGY REPORT*  Clinical Data: Abdominal pain and distention, status post hysterectomy  ABDOMEN - 1 VIEW  Comparison: CT scan performed yesterday.  Findings: The previously seen dilated small bowel loops have resolved.  Stool and oral contrast are present throughout the colon.  No pneumoperitoneum.  No gross organomegaly.  IMPRESSION: Normal stool and bowel gas pattern.  Original Report Authenticated By: Brandon Melnick, M.D.   Ct Abdomen Pelvis W Contrast  04/29/2011  **ADDENDUM** CREATED: 04/29/2011 09:14:54  These results were called by telephone on 04/29/2011  at  0915 hours to  Dr. Raeford Razor, who verbally acknowledged these results.  **END ADDENDUM** SIGNED BY: Charline Bills, M.D.    04/29/2011  *RADIOLOGY  REPORT*  Clinical Data: Abdominal pain, endometrial cancer diagnosed 06/2010 status post hysterectomy, omentectomy, and bilateral pelvic and para-aortic lymph node dissection, ongoing chemotherapy, prior appendectomy  CT ABDOMEN AND PELVIS WITH CONTRAST  Technique:  Multidetector CT imaging of the abdomen and pelvis was performed following the standard protocol during bolus administration of intravenous contrast.  Contrast: OMNIPAQUE IOHEXOL 300 MG/ML  SOLN  Comparison: PET CT dated 04/07/2011  Findings: Mild patchy opacities at the lung bases.  Small cysts and too small to characterize lesions in the liver, unchanged.  Spleen, pancreas, and adrenal glands are within normal limits.  Gallbladder is underdistended.  No intrahepatic or extrahepatic ductal dilatation.  Left kidney is notable for mild left hydronephrosis and diminished enhancement relative to the right.  Dilated loops of small bowel in the left upper abdomen.  Relative narrowing in the central abdomen (series 2/image 52, possibly reflecting a focal transition, although distal pelvic loops of bowel are not decompressed (series 2/image 79).  At the time of the study, oral contrast has not yet passed to this area.  Angulation of a loop of small bowel in the central abdomen (series 2/image 52), suggesting adhesions.  Additional transition in the left mid abdomen (series 2/image 57) with decompressed distal loops of small bowel.  Midline lower pelvic ventral hernia containing a loop of small bowel (series 2/image 81).  No evidence of abdominal aortic aneurysm.  1.7 cm short axis gastrohepatic nodal metastasis (series 2/image 24).  Additional smaller retroperitoneal nodes measuring up to 8 mm short axis (series 2/image 47). Prior para-aortic/pelvic lymph node dissection.  Interval development of moderate abdominopelvic ascites with peritoneal nodularity in the left upper abdomen (series 2/image 37), suspicious for peritoneal disease.  Status post  hysterectomy.  No adnexal masses.  No ureteral or bladder calculi.  Mid/distal left ureter is narrowed, possibly reflecting extensive compression.  Bladder is within normal limits.  Visualized osseous structures are within normal limits.  IMPRESSION: Dilated loops of small bowel in the left upper abdomen, compatible with at least early/partial small bowel obstruction, possibly on the basis of adhesions.  Additional suspected focal transition in the left mid abdomen.  Interval development of moderate abdominopelvic ascites with peritoneal nodularity, suggesting peritoneal disease.  Gastrohepatic and retroperitoneal nodal metastases, as described above.  Diminished enhancement of the left kidney relative to the right with mild left hydronephrosis, possibly on the basis of extrinsic compression on the mid/lower ureter by peritoneal disease.  Midline lower pelvic ventral hernia containing a loop of small bowel. Original Report Authenticated By: Charline Bills, M.D.   Nm Pet Image Initial (pi) Skull Base To Thigh  04/07/2011  *RADIOLOGY REPORT*  Clinical Data: Subsequent treatment strategy for endometrial cancer.  New lung nodules.  Rule out metastasis.  NUCLEAR MEDICINE PET CT SKULL BASE TO THIGH  Technique:  15.7 mCi F-18 FDG was injected intravenously via the right power port.  Full-ring PET imaging was performed from the skull base through the mid-thighs 55  minutes after injection.  CT data was obtained and used for attenuation correction and anatomic localization only.  (This was not acquired as a diagnostic CT examination.)  Fasting Blood Glucose:  89  Patient Weight:  204 pounds.  Comparison: Chest CT of 03/22/2011.  Abdominal pelvic CT of 02/18/2011.  Findings: PET images demonstrate moderate hypermetabolic "brown" fat within the neck.  This mildly degrades evaluation for hypermetabolic lymph nodes.  Hypermetabolic prevascular nodes. These measure up to 1.5 cm and a S.U.V. max of 12.4 on image 69. Central  AP window node measures 1.4 cm and a S.U.V. max of 7.6 on image 69. Ill-defined soft tissue density within the anterior mediastinum measures 2.0 x 3.6 cm and a S.U.V. max of 5.7 on image 69.  Does have morphology suggestive of residual thymus or thymus based lesion.  Celiac/gastrohepatic ligament node measures 1.8 cm and a S.U.V. max of 14.2 on image 115.  Left common iliac 8 mm node which measures a S.U.V. max of 8.1 on image 177.  CT images performed for attenuation correction demonstrate area of peripheral calcification within the right frontal temporal lobe. No well-defined underlying mass.  Example image four.  Chest findings deferred to recent diagnostic chest CT. No acute superimposed process.  Small lung nodules described on the prior are below the resolution of PET. No acute findings within the abdomen or pelvis.  Hysterectomy.  Postoperative or radiation induced edema within the pelvis.  IMPRESSION: 1.  Hypermetabolic lymph nodes within the chest, abdomen, and pelvis.  Favored to represent metastatic disease from the patients endometrial cancer.  Anterior mediastinal mildly hypermetabolic soft tissue could represent rebound thymic hyperplasia. 2.  Hypermetabolic "brown" fat within the neck and upper chest. Given this factor, no evidence of cervical disease. 3.  Minimal nonspecific calcification in the periphery of the right frontal/temporal region.  Either dural or possibly related to a small  underlying meningioma.  This could be reevaluated on follow- up.  Original Report Authenticated By: Consuello Bossier, M.D.   Medications: Scheduled Meds:   . enoxaparin  40 mg Subcutaneous Q24H  . filgrastim  300 mcg Subcutaneous Daily  . magnesium oxide  400 mg Oral Daily  . polyethylene glycol  17 g Oral TID  . DISCONTD: albuterol  2.5 mg Nebulization Q6H   Continuous Infusions:   . sodium chloride 125 mL/hr at 04/30/11 1054  . DISCONTD: sodium chloride 75 mL/hr at 04/29/11 1350   PRN  Meds:.acetaminophen, acetaminophen, albuterol, bisacodyl, guaiFENesin-dextromethorphan, HYDROmorphone, ipratropium, LORazepam, ondansetron (ZOFRAN) IV, ondansetron, oxyCODONE, DISCONTD: promethazine  Assessment/Plan: Partial small bowel obstruction (04/29/2011) Patient  With NG tube in place. Will consult general surgery tomorrow. Patient is familiar with Dr. Dwain Sarna.  Continue NG tube to wall suction. N.p.o., IV fluids.  Endometrial ca (11/24/2010)  Per Oncology. Start Chemotherapy next week.   Incisional Hernia (03/19/2011)  stable  Leukocytosis (04/29/2011) Probably secondary to steroid demargination.  Renal insufficiency/Mild Hydronephrosis IV fluids increased per Oncology.  Continue IV fluids  Disposition: When medically stable.    LOS: 4 days   Carollee Massed Pager [phone] 05/03/2011, 11:45 AM

## 2011-05-04 ENCOUNTER — Other Ambulatory Visit: Payer: Self-pay | Admitting: Lab

## 2011-05-04 ENCOUNTER — Ambulatory Visit: Payer: Self-pay | Admitting: Oncology

## 2011-05-04 ENCOUNTER — Inpatient Hospital Stay (HOSPITAL_COMMUNITY): Payer: Medicaid Other

## 2011-05-04 LAB — DIFFERENTIAL
Basophils Absolute: 0 10*3/uL (ref 0.0–0.1)
Basophils Relative: 1 % (ref 0–1)
Eosinophils Relative: 1 % (ref 0–5)
Lymphocytes Relative: 20 % (ref 12–46)
Neutro Abs: 1.3 10*3/uL — ABNORMAL LOW (ref 1.7–7.7)

## 2011-05-04 LAB — CBC
MCHC: 31.5 g/dL (ref 30.0–36.0)
Platelets: 176 10*3/uL (ref 150–400)
RDW: 15.3 % (ref 11.5–15.5)
WBC: 1.7 10*3/uL — ABNORMAL LOW (ref 4.0–10.5)

## 2011-05-04 LAB — MAGNESIUM: Magnesium: 1.7 mg/dL (ref 1.5–2.5)

## 2011-05-04 LAB — BASIC METABOLIC PANEL
Calcium: 8.8 mg/dL (ref 8.4–10.5)
Creatinine, Ser: 0.96 mg/dL (ref 0.50–1.10)
GFR calc non Af Amer: 71 mL/min — ABNORMAL LOW (ref >90)
Sodium: 137 mEq/L (ref 135–145)

## 2011-05-04 MED ORDER — FILGRASTIM 300 MCG/ML IJ SOLN
300.0000 ug | Freq: Every day | INTRAMUSCULAR | Status: AC
  Administered 2011-05-04 – 2011-05-07 (×4): 300 ug via SUBCUTANEOUS
  Filled 2011-05-04 (×5): qty 1

## 2011-05-04 MED ORDER — KCL IN DEXTROSE-NACL 40-5-0.9 MEQ/L-%-% IV SOLN
INTRAVENOUS | Status: AC
  Administered 2011-05-04: 17:00:00 via INTRAVENOUS
  Administered 2011-05-05: 1000 mL via INTRAVENOUS
  Administered 2011-05-06: 20:00:00 via INTRAVENOUS
  Filled 2011-05-04 (×8): qty 1000

## 2011-05-04 NOTE — Progress Notes (Signed)
  Subjective: Feels somewhat better - flatus and small bowel movement last night; feels much less distended; no nausea   Objective: Vital signs in last 24 hours: Temp:  [99.1 F (37.3 C)-100.5 F (38.1 C)] 99.7 F (37.6 C) (04/22 0537) Pulse Rate:  [83-96] 83  (04/22 0537) Resp:  [16-18] 18  (04/22 0537) BP: (146-171)/(83-99) 146/83 mmHg (04/22 0537) SpO2:  [95 %-100 %] 100 % (04/22 0537) Last BM Date: 05/01/11  Intake/Output from previous day: 04/21 0701 - 04/22 0700 In: 3013 [I.V.:3013] Out: 303 [Urine:3; Emesis/NG output:300] Intake/Output this shift:    General appearance: alert, cooperative and no distress GI: soft, minimal distention; palpable hernia at top of midline incision - 4 cm defect, soft, easily reducible  Lab Results:   Basename 05/03/11 2310 05/03/11 1400  WBC 2.9* 5.3  HGB 8.5* 8.5*  HCT 27.1* 27.6*  PLT 178 193   BMET  Basename 05/03/11 1400  NA 141  K 3.6  CL 105  CO2 21  GLUCOSE 65*  BUN 12  CREATININE 0.97  CALCIUM 8.9   PT/INR No results found for this basename: LABPROT:2,INR:2 in the last 72 hours ABG No results found for this basename: PHART:2,PCO2:2,PO2:2,HCO3:2 in the last 72 hours  Studies/Results: Dg Abd 1 View  05/03/2011  *RADIOLOGY REPORT*  Clinical Data: Evaluate possible small bowel obstruction. Distension and abdominal pain.  ABDOMEN - 1 VIEW  Comparison: 05/01/2011  Findings: Nasogastric tube is in place, tip overlying the level of the stomach.  There is residual contrast within colonic loops. There is persistent dilatation of central small bowel loops.  No evidence for free intraperitoneal air on these supine views.  IMPRESSION:  1.  Persistent retained contrast.  Last contrast exam 04/29/2011. 2.  Persistent dilatation of small bowel loops, consistent with at least partial small bowel obstruction.  Original Report Authenticated By: Patterson Hammersmith, M.D.   Dg Chest Port 1 View  05/03/2011  *RADIOLOGY REPORT*  Clinical  Data: Fever, weakness  PORTABLE CHEST - 1 VIEW  Comparison: PET CT 04/07/2011, chest CT 03/22/2011, chest radiograph 03/22/2011  Findings: Right-sided Port-A-Cath tip terminates over the cavoatrial junction.  Nasogastric tube terminates below the level of the diaphragms but the tip is not included on the film.  Lung volumes are extremely low, with curvilinear bilateral lower lobe atelectasis.  Underlying pulmonary consolidation could be obscured. There is a suggestion of retrocardiac patchy airspace opacity but this is not well visualized.  No pleural effusion.  IMPRESSION: Extremely low lung volumes with bilateral lower lobe atelectasis and possible retrocardiac opacity.  PA and lateral chest radiograph at full inspiration are recommended when the patient is clinically able, for better evaluation.  Original Report Authenticated By: Harrel Lemon, M.D.    Anti-infectives: Anti-infectives    None      Assessment/Plan: s/p * No surgery found * Partial SBO - likely due to progression of tumor; patient is not completely obstructed, as contrast is in colon and still having bowel movements Recheck labs/ plain films today If she becomes obstructed, then surgical options for palliation would include exploratory laparotomy with possible small bowel resection; exp lap with possible small bowel bypass; or open gastrostomy tube. We will continue to follow with you. Encourage ambulation.  LOS: 5 days    Dorismar Chay K. 05/04/2011

## 2011-05-04 NOTE — Progress Notes (Signed)
Subjective: Patient with NG tube in place.  Her abdominal pain is improving.  Objective: Weight change:   Intake/Output Summary (Last 24 hours) at 05/01/11 1325 Last data filed at 05/01/11 0600  Gross per 24 hour  Intake 2507.5 ml  Output      1 ml  Net 2506.5 ml    Filed Vitals:   05/01/11 0506  BP: 102/69  Pulse: 87  Temp: 98.8 F (37.1 C)  Resp: 16   General: Patient does not seem to be in any acute distress.   HEENT: Head normocephalic atraumatic.  Cardiovascular: Regular rate rhythm.  Lungs: Clear to auscultation bilaterally.  Abdomen: Mildly distended diffuse minimal tenderness, minimal bowel sounds; positive for abdominal hernia.  Extremities: No edema   Lab Results: Review  Micro Results: Recent Results (from the past 240 hour(s))  URINE CULTURE     Status: Normal   Collection Time   04/29/11 10:17 PM      Component Value Range Status Comment   Specimen Description URINE, CLEAN CATCH   Final    Special Requests none   Final    Culture  Setup Time [id number]   Final    Colony Count NO GROWTH   Final    Culture NO GROWTH   Final    Report Status 04/30/2011 FINAL   Final     Studies/Results: Abd 1 View (kub)  04/30/2011  *RADIOLOGY REPORT*  Clinical Data: Abdominal pain and distention, status post hysterectomy  ABDOMEN - 1 VIEW  Comparison: CT scan performed yesterday.  Findings: The previously seen dilated small bowel loops have resolved.  Stool and oral contrast are present throughout the colon.  No pneumoperitoneum.  No gross organomegaly.  IMPRESSION: Normal stool and bowel gas pattern.  Original Report Authenticated By: Brandon Melnick, M.D.   Ct Abdomen Pelvis W Contrast  04/29/2011  **ADDENDUM** CREATED: 04/29/2011 09:14:54  These results were called by telephone on 04/29/2011  at  0915 hours to  Dr. Raeford Razor, who verbally acknowledged these results.  **END ADDENDUM** SIGNED BY: Charline Bills, M.D.    04/29/2011  *RADIOLOGY REPORT*  Clinical  Data: Abdominal pain, endometrial cancer diagnosed 06/2010 status post hysterectomy, omentectomy, and bilateral pelvic and para-aortic lymph node dissection, ongoing chemotherapy, prior appendectomy  CT ABDOMEN AND PELVIS WITH CONTRAST  Technique:  Multidetector CT imaging of the abdomen and pelvis was performed following the standard protocol during bolus administration of intravenous contrast.  Contrast: OMNIPAQUE IOHEXOL 300 MG/ML  SOLN  Comparison: PET CT dated 04/07/2011  Findings: Mild patchy opacities at the lung bases.  Small cysts and too small to characterize lesions in the liver, unchanged.  Spleen, pancreas, and adrenal glands are within normal limits.  Gallbladder is underdistended.  No intrahepatic or extrahepatic ductal dilatation.  Left kidney is notable for mild left hydronephrosis and diminished enhancement relative to the right.  Dilated loops of small bowel in the left upper abdomen.  Relative narrowing in the central abdomen (series 2/image 52, possibly reflecting a focal transition, although distal pelvic loops of bowel are not decompressed (series 2/image 79).  At the time of the study, oral contrast has not yet passed to this area.  Angulation of a loop of small bowel in the central abdomen (series 2/image 52), suggesting adhesions.  Additional transition in the left mid abdomen (series 2/image 57) with decompressed distal loops of small bowel.  Midline lower pelvic ventral hernia containing a loop of small bowel (series 2/image 81).  No evidence  of abdominal aortic aneurysm.  1.7 cm short axis gastrohepatic nodal metastasis (series 2/image 24).  Additional smaller retroperitoneal nodes measuring up to 8 mm short axis (series 2/image 47). Prior para-aortic/pelvic lymph node dissection.  Interval development of moderate abdominopelvic ascites with peritoneal nodularity in the left upper abdomen (series 2/image 37), suspicious for peritoneal disease.  Status post hysterectomy.  No adnexal  masses.  No ureteral or bladder calculi.  Mid/distal left ureter is narrowed, possibly reflecting extensive compression.  Bladder is within normal limits.  Visualized osseous structures are within normal limits.  IMPRESSION: Dilated loops of small bowel in the left upper abdomen, compatible with at least early/partial small bowel obstruction, possibly on the basis of adhesions.  Additional suspected focal transition in the left mid abdomen.  Interval development of moderate abdominopelvic ascites with peritoneal nodularity, suggesting peritoneal disease.  Gastrohepatic and retroperitoneal nodal metastases, as described above.  Diminished enhancement of the left kidney relative to the right with mild left hydronephrosis, possibly on the basis of extrinsic compression on the mid/lower ureter by peritoneal disease.  Midline lower pelvic ventral hernia containing a loop of small bowel. Original Report Authenticated By: Charline Bills, M.D.   Nm Pet Image Initial (pi) Skull Base To Thigh  04/07/2011  *RADIOLOGY REPORT*  Clinical Data: Subsequent treatment strategy for endometrial cancer.  New lung nodules.  Rule out metastasis.  NUCLEAR MEDICINE PET CT SKULL BASE TO THIGH  Technique:  15.7 mCi F-18 FDG was injected intravenously via the right power port.  Full-ring PET imaging was performed from the skull base through the mid-thighs 55  minutes after injection.  CT data was obtained and used for attenuation correction and anatomic localization only.  (This was not acquired as a diagnostic CT examination.)  Fasting Blood Glucose:  89  Patient Weight:  204 pounds.  Comparison: Chest CT of 03/22/2011.  Abdominal pelvic CT of 02/18/2011.  Findings: PET images demonstrate moderate hypermetabolic "brown" fat within the neck.  This mildly degrades evaluation for hypermetabolic lymph nodes.  Hypermetabolic prevascular nodes. These measure up to 1.5 cm and a S.U.V. max of 12.4 on image 69. Central AP window node measures 1.4  cm and a S.U.V. max of 7.6 on image 69. Ill-defined soft tissue density within the anterior mediastinum measures 2.0 x 3.6 cm and a S.U.V. max of 5.7 on image 69.  Does have morphology suggestive of residual thymus or thymus based lesion.  Celiac/gastrohepatic ligament node measures 1.8 cm and a S.U.V. max of 14.2 on image 115.  Left common iliac 8 mm node which measures a S.U.V. max of 8.1 on image 177.  CT images performed for attenuation correction demonstrate area of peripheral calcification within the right frontal temporal lobe. No well-defined underlying mass.  Example image four.  Chest findings deferred to recent diagnostic chest CT. No acute superimposed process.  Small lung nodules described on the prior are below the resolution of PET. No acute findings within the abdomen or pelvis.  Hysterectomy.  Postoperative or radiation induced edema within the pelvis.  IMPRESSION: 1.  Hypermetabolic lymph nodes within the chest, abdomen, and pelvis.  Favored to represent metastatic disease from the patients endometrial cancer.  Anterior mediastinal mildly hypermetabolic soft tissue could represent rebound thymic hyperplasia. 2.  Hypermetabolic "brown" fat within the neck and upper chest. Given this factor, no evidence of cervical disease. 3.  Minimal nonspecific calcification in the periphery of the right frontal/temporal region.  Either dural or possibly related to a small underlying meningioma.  This could be reevaluated on follow- up.  Original Report Authenticated By: Consuello Bossier, M.D.   Medications: Scheduled Meds:   . enoxaparin  40 mg Subcutaneous Q24H  . filgrastim  300 mcg Subcutaneous Daily  . magnesium oxide  400 mg Oral Daily  . polyethylene glycol  17 g Oral TID  . DISCONTD: albuterol  2.5 mg Nebulization Q6H   Continuous Infusions:   . sodium chloride 125 mL/hr at 04/30/11 1054  . DISCONTD: sodium chloride 75 mL/hr at 04/29/11 1350   PRN Meds:.acetaminophen, acetaminophen,  albuterol, bisacodyl, guaiFENesin-dextromethorphan, HYDROmorphone, ipratropium, LORazepam, ondansetron (ZOFRAN) IV, ondansetron, oxyCODONE, DISCONTD: promethazine  Assessment/Plan: Partial small bowel obstruction (04/29/2011) Patient  With NG tube in place. General surgery was consulted yesterday. Patient continues to improve. Appreciate general surgery/oncology's input.  Endometrial ca (11/24/2010)  Per Oncology. Start Chemotherapy next week.   Incisional Hernia (03/19/2011)  stable  Leukocytosis (04/29/2011) Resolved  Renal insufficiency/Mild Hydronephrosis  Continue IV fluids  Disposition: When medically stable.    LOS: 5 days   Carollee Massed Pager [phone] 05/04/2011, 5:56 PM

## 2011-05-04 NOTE — Progress Notes (Signed)
05/04/2011, 9:35 AM  Hospital day: 6 Antibiotics: none Chemotherapy: day 8 cycle 1 cisplatin/ adriamycin  Subjective: More problems over weekend with bowel obstruction symptoms and nausea, with NG placed on 05-02-11. She had slight temperature elevation, but has not yet been neutropenic and no localizing symptoms of infection. She did have small BM yesterday morning and has had increased flatus. Abdomen is less uncomfortable now and she has not needed dilaudid since 1400 yesterday (1 mg x2 only on 05-03-11). She has been up walking. NG is not too uncomfortable. She is voiding easily. No problems with PAC. No mucositis symptoms or sore throat. Denies shortness of breath.  Husband at bedside and I have written note for his work. Paperwork has been faxed for her mother in [place].  Objective: Vital signs in last 24 hours: Blood pressure 146/83, pulse 83, temperature 99.7 F (37.6 C), temperature source Oral, resp. rate 18, height 5\' 6"  (1.676 m), weight 205 lb (92.987 kg), last menstrual period 07/02/2010, SpO2 100.00%.  IVF NS at 125 cc/ hr.  Awake and alert, not in any acute discomfort now. NG draining clear yellow fluid, tho dark fluid is in container. Oral mucosa pale and some coating on tongue, but not clearly thrush. PERRL. No JVD. PAC site fine. Abdomen distended but not as much as on my exam 4-19, softer, minimal BS (may be NG), not tender to gentle palpation. Ventral hernia reduces easily, not tender.  LE no edema or cords. Moves easily in bed. Lungs without wheezes or rales. Cor RRR. Intake/Output from previous day: 04/21 0701 - 04/22 0700 In: 3013 [I.V.:3013] Out: 303 [Urine:3; Emesis/NG output:300] Intake/Output this shift:    Lab Results: CBC diff and Bmet/ magnesium ordered for today (magnesium due to recent CDDP) Will follow up counts today as she may well need further neupogen.  Basename 05/03/11 2310 05/03/11 1400  WBC 2.9* 5.3  HGB 8.5* 8.5*  HCT 27.1* 27.6*  PLT 178  193   BMET  Basename 05/03/11 1400  NA 141  K 3.6  CL 105  CO2 21  GLUCOSE 65*  BUN 12  CREATININE 0.97  CALCIUM 8.9    Studies/Results: repeat abd Xray ordered by Dr.Tsuei now. Dg Abd 1 View  05/03/2011  *RADIOLOGY REPORT*  Clinical Data: Evaluate possible small bowel obstruction. Distension and abdominal pain.  ABDOMEN - 1 VIEW  Comparison: 05/01/2011  Findings: Nasogastric tube is in place, tip overlying the level of the stomach.  There is residual contrast within colonic loops. There is persistent dilatation of central small bowel loops.  No evidence for free intraperitoneal air on these supine views.  IMPRESSION:  1.  Persistent retained contrast.  Last contrast exam 04/29/2011. 2.  Persistent dilatation of small bowel loops, consistent with at least partial small bowel obstruction.  Original Report Authenticated By: Patterson Hammersmith, M.D.   Dg Chest Port 1 View  05/03/2011  *RADIOLOGY REPORT*  Clinical Data: Fever, weakness  PORTABLE CHEST - 1 VIEW  Comparison: PET CT 04/07/2011, chest CT 03/22/2011, chest radiograph 03/22/2011  Findings: Right-sided Port-A-Cath tip terminates over the cavoatrial junction.  Nasogastric tube terminates below the level of the diaphragms but the tip is not included on the film.  Lung volumes are extremely low, with curvilinear bilateral lower lobe atelectasis.  Underlying pulmonary consolidation could be obscured. There is a suggestion of retrocardiac patchy airspace opacity but this is not well visualized.  No pleural effusion.  IMPRESSION: Extremely low lung volumes with bilateral lower lobe atelectasis and  possible retrocardiac opacity.  PA and lateral chest radiograph at full inspiration are recommended when the patient is clinically able, for better evaluation.  Original Report Authenticated By: Harrel Lemon, M.D.   Discussed with patient and husband at bedside now, explaining that surgery would not be able to remove all of the cancer now so that  surgery just to remove cancer would not be helpful enough to pursue. Hopefully the chemotherapy which was begun just prior to this admission will improve the cancer, tho it will not cure the cancer. Surgery for the bowel obstruction can still be considered if present problem does not resolve with conservative management.  I have discussed also with Dr.Tsuei, who is aware that she had new chemotherapy regimen begun shortly prior to this admission; G tube for intermittent drainage might also be consideration if gastric emptying remains a problem.  Assessment/Plan: 1. Progressive metastatic endometrial carcinoma:  counts not yet at nadir from first cycle of cisplatin/ adriamycin given  04-27-2011 and I expect to order further neupogen when CBC/diff results today. May need K+ and Mg++ supplemented IV just from cisplatin (as well as NG drainage). 2. Partial bowel obstruction: appreciate general surgery following 3.severe chronic constipation since childhood: will follow up Xray pending. May want to try clamping NG around doses of miralax or lactulose, as she requires regular laxatives even without chemo/narcotics. 4.PAC in 5.difficult social situation as noted.  Krystal [name] P

## 2011-05-04 NOTE — Progress Notes (Signed)
Pt. Had BMET, CBC and Mag checked earlier today. Drs changed her fluids to D5&.9NaClw/KCl running at 100 instead of the 125 her NS was at. NG tube continues to drain greenish fluids. Pt. Also had a solid bowel movement today. Pt. Denies any pain.  Biggest issue is the high number of family members/visitors in the room simultaneously making it difficult to assess/care for patient. Barth Kirks 7:56 PM

## 2011-05-04 NOTE — Progress Notes (Signed)
[facility] hospitalist progress note. Chief complaint. Fever. History of present illness. This 41 year old female in hospital with partial small bowel obstruction and endometrial cancer. She was noted by the nursing staff to have a temperature of 100.5 by mouth. Patient has no specific complaints. I do note that she has had a low-grade temp 99.6-99.7 over the past 48 hours. She had some leukocytosis earlier but this thought secondary to steroids. I don't see any previous blood cultures. She had a urine culture from 04/29/11 that produced no growth. I did order repeat blood cultures in response to the fever. Also I checked a repeat urinalysis and this appears unremarkable. A repeat CBC was obtained and was found WBC 2.9, hemoglobin 8.5, platelets 178. I also checked a portable chest x-ray and his son extremely low lung findings with bilateral lower lobe atelectasis and possible retrocardiac opacity. PA and lateral chest radiograph in full inspiration recommended when the patient is clinically able, for better evaluation. I did see the patient at bedside. She has no acute complaints. She does appear somewhat depressed. Vital signs. Temperature 100.5, pulse 88, respiration 18, blood pressure 171/94. O2 saturation at 97%. General appearance. Well-developed middle-aged female in no acute distress. She is alert, cooperative and oriented. Her affect is quite flat, she does not make eye contact, vocal volume is quite diminished, all consistent with depression. Cardiac. Rate and rhythm regular. No jugular venous distention or edema. Lungs. Breath sounds are clear and equal bilaterally. Abdomen. Soft with positive bowel sounds. No pain noted with palpation. Impression/plan. Problem #1. Fever unknown origin. After seeing the patient and reviewing the lab work and radiology I can find no evidence of active infection. I suspect low-grade temperatures over the past 48 hours maybe secondary to malignancy. Will defer initiation  of any antibiotics to the discretion of the rounding physician later this a.m.

## 2011-05-04 NOTE — Progress Notes (Signed)
Pt has a temp of 100.5 and B/P of 171/94, notified the on call. New order given for blood culture X2,  per Lenny Pastel. Pt refuse the lab drawn and states " if it not drawn from my port than I don't  Want it."  Lenny Pastel, NP  notified

## 2011-05-05 LAB — DIFFERENTIAL
Band Neutrophils: 0 % (ref 0–10)
Basophils Absolute: 0 10*3/uL (ref 0.0–0.1)
Eosinophils Absolute: 0 10*3/uL (ref 0.0–0.7)
Lymphocytes Relative: 0 % — ABNORMAL LOW (ref 12–46)
Monocytes Absolute: 0 10*3/uL — ABNORMAL LOW (ref 0.1–1.0)
Monocytes Relative: 0 % — ABNORMAL LOW (ref 3–12)
Neutrophils Relative %: 0 % — ABNORMAL LOW (ref 43–77)

## 2011-05-05 LAB — CBC
HCT: 28.7 % — ABNORMAL LOW (ref 36.0–46.0)
MCHC: 31.4 g/dL (ref 30.0–36.0)
Platelets: 162 10*3/uL (ref 150–400)
RDW: 15.2 % (ref 11.5–15.5)
WBC: 1 10*3/uL — CL (ref 4.0–10.5)

## 2011-05-05 LAB — BASIC METABOLIC PANEL
BUN: 9 mg/dL (ref 6–23)
CO2: 23 mEq/L (ref 19–32)
Calcium: 9 mg/dL (ref 8.4–10.5)
Chloride: 103 mEq/L (ref 96–112)
Creatinine, Ser: 0.94 mg/dL (ref 0.50–1.10)
GFR calc Af Amer: 85 mL/min — ABNORMAL LOW (ref >90)
GFR calc non Af Amer: 73 mL/min — ABNORMAL LOW (ref >90)
Glucose, Bld: 103 mg/dL — ABNORMAL HIGH (ref 70–99)
Potassium: 3.7 mEq/L (ref 3.5–5.1)
Sodium: 136 mEq/L (ref 135–145)

## 2011-05-05 LAB — URINE CULTURE
Colony Count: 25000
Culture  Setup Time: 201304220309

## 2011-05-05 LAB — PATHOLOGIST SMEAR REVIEW

## 2011-05-05 MED ORDER — PHENOL 1.4 % MT LIQD
1.0000 | OROMUCOSAL | Status: DC | PRN
  Filled 2011-05-05 (×2): qty 177

## 2011-05-05 NOTE — Progress Notes (Signed)
Pt.'s NG tube clamped from 0930 till 1800, pt started to feel full and asked to be hooked back up to suction to LIS. Abdomen felt distended and firm. WBC critical value 1.0, Dr. Darrold Span called orders given. Pt. ambulating in the hall around the unit, tolerating well. Pt. Has had many visitors all day and she appears to be in a happier mood. Ice chips will be given to pt. Per her requests. Will monitor her abdomen whille she is hooked back up to suction.

## 2011-05-05 NOTE — Progress Notes (Signed)
Krystal [name]. Corliss Skains, MD, [facility]  05/05/2011 5:27 PM

## 2011-05-05 NOTE — Clinical Social Work Note (Signed)
Clinical Social Work met with patient at bedside today.  Patient's partner also in the room, but appeared to be sleeping.  CSW and patient discussed patient's values and wishes for her children.  Ms. Munar is very hopeful that her mother will be able to visit from the [place] to help provide care at home.  This would be temporary; hopefully a temporary non-immigrant VISA will be granted, her mother has an interview at the [employer] today.  Ms. Slappey shares that if she passes, her partner and his mother will provide primary care.  At this time, her biggest concern is leaving no funds for them and getting her durable will finalized.  The patient has a strong support system and she states her friends are getting these affairs in order.  She reports feeling hopeful that her friends are creating a college fund for her daughters.  At this time, Ms. Piazza expresses feeling scared of the unknown.  We processed her current emotions and discussed her current goals for her family.  She would like to spend quality time with her children, have pictures taken together, and get her children enrolled in [employer] to ensure they have strong emotional support/counseling.  CSW currently in collaboration with [employer] to arrange future sessions.  CSW will continue to follow throughout DC and in outpatient setting.  Kathrin Penner, MSW, [facility] Clinical Social Worker [facility] [phone]

## 2011-05-05 NOTE — Progress Notes (Signed)
Patient seems to be tolerating having her NG clamped for a couple of hours.  Will wait until later today.  If she is still doing OK, then will d/c NG tube.  No surgical indications at this time.  Wilmon Arms. Corliss Skains, MD, [facility]  05/05/2011 11:51 AM

## 2011-05-05 NOTE — Progress Notes (Signed)
  05/05/2011, 9:03 AM  Hospital day: 7 Antibiotics: none Chemotherapy: day 9 cycle 1 CDDP/ adriamycin    Subjective: Feels better. Had BM, no nausea, walked full hall x2 yesterday, not really hungry, throat uncomfortable from NG which remains to suction. No abdominal pain, no pain meds in > 24 hrs. Not aching from neupogen. Not extremely fatigued when up walking.  Objective: Vital signs in last 24 hours: Blood pressure 141/88, pulse 87, temperature 99.4 F (37.4 C), temperature source Oral, resp. rate 20, height 5\' 6"  (1.676 m), weight 205 lb (92.987 kg), last menstrual period 07/02/2010, SpO2 97.00%.  Awake, alert, laughing some, moves easily in bed. NG to suction, clear yellow fluid in tubing. PERRL. Not icteric. Tongue coated without change, no candida on buccal mucosa.  PAC site fine. COR RRR. Lungs clear ant/lat. Abdomen softer, nontender, quiet, less distended. LE no edema or cords. Intake/Output from previous day: May 31, 2022 0701 - 04/23 0700 In: 1338 [I.V.:1338] Out: 1301 [Urine:1300; Stool:1] Intake/Output this shift: Total I/O In: 795 [I.V.:795] Out: 600 [Urine:300; Emesis/NG output:300]  IVF D5NS + 40 K at 100.  Lab Results:  Basename 31-May-2011 1055 05/03/11 2310  WBC 1.7* 2.9*  HGB 8.4* 8.5*  HCT 26.7* 27.1*  PLT 176 178  CBC diff today pending. I will make adjustments in neupogen if needed then. BMET  Basename 05/31/2011 1055 05/03/11 1400  NA 137 141  K 3.5 3.6  CL 103 105  CO2 19 21  GLUCOSE 62* 65*  BUN 12 12  CREATININE 0.96 0.97  CALCIUM 8.8 8.9   BMET pending today. If K low would add Magnesium level to same blood and add magnesium to IVF (cisplatin causes renal wasting of K and Mg)  Studies/Results: Abdominal Xrays 05-31-11 noted I do not believe these have been repeated today  Assessment/Plan: 1. Endometrial carcinoma progressive within 2 months of initial taxol/carboplatin/RT. Begun on second line cisplatin/adriamycin 04-27-2011. On gCSF, counts  pending today. Next chemo due May 6 2. Partial bowel obstruction: symptoms improved today. Surgery following. I have asked for NG to be clamped at least until surgery follows up a little later today. Still NPO 3. PAC in 4. No positve cultures 5.difficult social situation, [facility] support staff aware   Lakeena Downie P

## 2011-05-05 NOTE — Progress Notes (Signed)
Brief narrative: Patient is a 41 year old female from [place]. She has metastatic endometrial cancer. She came in with partial small bowel obstruction. Her oncologist Dr. Darrold Span and the Gen. surgery is following. Patient presently with NG tube. She is due for her next chemotherapy may 6.  Subjective: Patient with NG tube in place.  Her abdominal pain continues to improve.  Objective: Weight change:   Intake/Output Summary (Last 24 hours) at 05/01/11 1325 Last data filed at 05/01/11 0600  Gross per 24 hour  Intake 2507.5 ml  Output      1 ml  Net 2506.5 ml    Filed Vitals:   05/01/11 0506  BP: 102/69  Pulse: 87  Temp: 98.8 F (37.1 C)  Resp: 16   General: Patient does not seem to be in any acute distress.   HEENT: Head normocephalic atraumatic.  Cardiovascular: Regular rate rhythm.  Lungs: Clear to auscultation bilaterally.  Abdomen: Mildly distended diffuse minimal tenderness,no bowel sounds; positive for abdominal hernia.  Extremities: No edema   Lab Results: Review  Micro Results: Recent Results (from the past 240 hour(s))  URINE CULTURE     Status: Normal   Collection Time   04/29/11 10:17 PM      Component Value Range Status Comment   Specimen Description URINE, CLEAN CATCH   Final    Special Requests none   Final    Culture  Setup Time [id number]   Final    Colony Count NO GROWTH   Final    Culture NO GROWTH   Final    Report Status 04/30/2011 FINAL   Final     Studies/Results: Abd 1 View (kub)  04/30/2011  *RADIOLOGY REPORT*  Clinical Data: Abdominal pain and distention, status post hysterectomy  ABDOMEN - 1 VIEW  Comparison: CT scan performed yesterday.  Findings: The previously seen dilated small bowel loops have resolved.  Stool and oral contrast are present throughout the colon.  No pneumoperitoneum.  No gross organomegaly.  IMPRESSION: Normal stool and bowel gas pattern.  Original Report Authenticated By: Brandon Melnick, M.D.   Ct Abdomen Pelvis  W Contrast  04/29/2011  **ADDENDUM** CREATED: 04/29/2011 09:14:54  These results were called by telephone on 04/29/2011  at  0915 hours to  Dr. Raeford Razor, who verbally acknowledged these results.  **END ADDENDUM** SIGNED BY: Charline Bills, M.D.    04/29/2011  *RADIOLOGY REPORT*  Clinical Data: Abdominal pain, endometrial cancer diagnosed 06/2010 status post hysterectomy, omentectomy, and bilateral pelvic and para-aortic lymph node dissection, ongoing chemotherapy, prior appendectomy  CT ABDOMEN AND PELVIS WITH CONTRAST  Technique:  Multidetector CT imaging of the abdomen and pelvis was performed following the standard protocol during bolus administration of intravenous contrast.  Contrast: OMNIPAQUE IOHEXOL 300 MG/ML  SOLN  Comparison: PET CT dated 04/07/2011  Findings: Mild patchy opacities at the lung bases.  Small cysts and too small to characterize lesions in the liver, unchanged.  Spleen, pancreas, and adrenal glands are within normal limits.  Gallbladder is underdistended.  No intrahepatic or extrahepatic ductal dilatation.  Left kidney is notable for mild left hydronephrosis and diminished enhancement relative to the right.  Dilated loops of small bowel in the left upper abdomen.  Relative narrowing in the central abdomen (series 2/image 52, possibly reflecting a focal transition, although distal pelvic loops of bowel are not decompressed (series 2/image 79).  At the time of the study, oral contrast has not yet passed to this area.  Angulation of a loop  of small bowel in the central abdomen (series 2/image 52), suggesting adhesions.  Additional transition in the left mid abdomen (series 2/image 57) with decompressed distal loops of small bowel.  Midline lower pelvic ventral hernia containing a loop of small bowel (series 2/image 81).  No evidence of abdominal aortic aneurysm.  1.7 cm short axis gastrohepatic nodal metastasis (series 2/image 24).  Additional smaller retroperitoneal nodes  measuring up to 8 mm short axis (series 2/image 47). Prior para-aortic/pelvic lymph node dissection.  Interval development of moderate abdominopelvic ascites with peritoneal nodularity in the left upper abdomen (series 2/image 37), suspicious for peritoneal disease.  Status post hysterectomy.  No adnexal masses.  No ureteral or bladder calculi.  Mid/distal left ureter is narrowed, possibly reflecting extensive compression.  Bladder is within normal limits.  Visualized osseous structures are within normal limits.  IMPRESSION: Dilated loops of small bowel in the left upper abdomen, compatible with at least early/partial small bowel obstruction, possibly on the basis of adhesions.  Additional suspected focal transition in the left mid abdomen.  Interval development of moderate abdominopelvic ascites with peritoneal nodularity, suggesting peritoneal disease.  Gastrohepatic and retroperitoneal nodal metastases, as described above.  Diminished enhancement of the left kidney relative to the right with mild left hydronephrosis, possibly on the basis of extrinsic compression on the mid/lower ureter by peritoneal disease.  Midline lower pelvic ventral hernia containing a loop of small bowel. Original Report Authenticated By: Charline Bills, M.D.   Nm Pet Image Initial (pi) Skull Base To Thigh  04/07/2011  *RADIOLOGY REPORT*  Clinical Data: Subsequent treatment strategy for endometrial cancer.  New lung nodules.  Rule out metastasis.  NUCLEAR MEDICINE PET CT SKULL BASE TO THIGH  Technique:  15.7 mCi F-18 FDG was injected intravenously via the right power port.  Full-ring PET imaging was performed from the skull base through the mid-thighs 55  minutes after injection.  CT data was obtained and used for attenuation correction and anatomic localization only.  (This was not acquired as a diagnostic CT examination.)  Fasting Blood Glucose:  89  Patient Weight:  204 pounds.  Comparison: Chest CT of 03/22/2011.  Abdominal pelvic  CT of 02/18/2011.  Findings: PET images demonstrate moderate hypermetabolic "brown" fat within the neck.  This mildly degrades evaluation for hypermetabolic lymph nodes.  Hypermetabolic prevascular nodes. These measure up to 1.5 cm and a S.U.V. max of 12.4 on image 69. Central AP window node measures 1.4 cm and a S.U.V. max of 7.6 on image 69. Ill-defined soft tissue density within the anterior mediastinum measures 2.0 x 3.6 cm and a S.U.V. max of 5.7 on image 69.  Does have morphology suggestive of residual thymus or thymus based lesion.  Celiac/gastrohepatic ligament node measures 1.8 cm and a S.U.V. max of 14.2 on image 115.  Left common iliac 8 mm node which measures a S.U.V. max of 8.1 on image 177.  CT images performed for attenuation correction demonstrate area of peripheral calcification within the right frontal temporal lobe. No well-defined underlying mass.  Example image four.  Chest findings deferred to recent diagnostic chest CT. No acute superimposed process.  Small lung nodules described on the prior are below the resolution of PET. No acute findings within the abdomen or pelvis.  Hysterectomy.  Postoperative or radiation induced edema within the pelvis.  IMPRESSION: 1.  Hypermetabolic lymph nodes within the chest, abdomen, and pelvis.  Favored to represent metastatic disease from the patients endometrial cancer.  Anterior mediastinal mildly hypermetabolic soft tissue  could represent rebound thymic hyperplasia. 2.  Hypermetabolic "brown" fat within the neck and upper chest. Given this factor, no evidence of cervical disease. 3.  Minimal nonspecific calcification in the periphery of the right frontal/temporal region.  Either dural or possibly related to a small underlying meningioma.  This could be reevaluated on follow- up.  Original Report Authenticated By: Consuello Bossier, M.D.   Medications: Scheduled Meds:   . enoxaparin  40 mg Subcutaneous Q24H  . filgrastim  300 mcg Subcutaneous Daily  .  magnesium oxide  400 mg Oral Daily  . polyethylene glycol  17 g Oral TID  . DISCONTD: albuterol  2.5 mg Nebulization Q6H   Continuous Infusions:   . sodium chloride 125 mL/hr at 04/30/11 1054  . DISCONTD: sodium chloride 75 mL/hr at 04/29/11 1350   PRN Meds:.acetaminophen, acetaminophen, albuterol, bisacodyl, guaiFENesin-dextromethorphan, HYDROmorphone, ipratropium, LORazepam, ondansetron (ZOFRAN) IV, ondansetron, oxyCODONE, DISCONTD: promethazine  Assessment/Plan: Partial small bowel obstruction (04/29/2011) Patient  With NG tube in place. Appreciate general surgery input.   Endometrial ca (11/24/2010)  Per Oncology. Start Chemotherapy next week.   Incisional Hernia (03/19/2011)  stable  Leukocytosis (04/29/2011) Resolved  Renal insufficiency/Mild Hydronephrosis  Continue IV fluids  Disposition: When medically stable.    LOS: 6 days   Carollee Massed Pager [phone] 05/05/2011, 5:42 PM

## 2011-05-05 NOTE — Progress Notes (Addendum)
Subjective: NG not recorded for yesterday.  She did have a BM.Marland Kitchen Not painful like before, passing gas. 600 ml in the cannister now,  Pt says it was changed this AM.  300 recorded last shift.  She had a BM which she says looked like stuff in NG cannister, which is green. ^  Objective: Vital signs in last 24 hours: Temp:  [98.8 F (37.1 C)-99.4 F (37.4 C)] 99.4 F (37.4 C) (04/23 0545) Pulse Rate:  [78-87] 87  (04/23 0545) Resp:  [16-20] 20  (04/23 0545) BP: (141-162)/(88-95) 141/88 mmHg (04/23 0545) SpO2:  [95 %-99 %] 97 % (04/23 0545) Last BM Date: 05/04/11 +BM last PM, TM 100.5 BP is elevated.No acute changes in labs Intake/Output from previous day: 04/22 0701 - 04/23 0700 In: 1338 [I.V.:1338] Out: 1301 [Urine:1300; Stool:1] Intake/Output this shift: Total I/O In: 795 [I.V.:795] Out: 600 [Urine:300; Emesis/NG output:300]  General appearance: alert, cooperative and no distress GI: soft, not tender this AM. few bowel sounds, no distension, + flatus  Lab Results:   Basename 05/04/11 1055 05/03/11 2310  WBC 1.7* 2.9*  HGB 8.4* 8.5*  HCT 26.7* 27.1*  PLT 176 178    BMET  Basename 05/04/11 1055 05/03/11 1400  NA 137 141  K 3.5 3.6  CL 103 105  CO2 19 21  GLUCOSE 62* 65*  BUN 12 12  CREATININE 0.96 0.97  CALCIUM 8.8 8.9   PT/INR No results found for this basename: LABPROT:2,INR:2 in the last 72 hours   Lab 04/30/11 0530 04/29/11 0326  AST 20 25  ALT 20 23  ALKPHOS 82 99  BILITOT 0.4 0.4  PROT 6.9 7.9  ALBUMIN 3.2* 3.8     Lipase     Component Value Date/Time   LIPASE 18 04/29/2011 0326     Studies/Results: Dg Chest Port 1 View  05/03/2011  *RADIOLOGY REPORT*  Clinical Data: Fever, weakness  PORTABLE CHEST - 1 VIEW  Comparison: PET CT 04/07/2011, chest CT 03/22/2011, chest radiograph 03/22/2011  Findings: Right-sided Port-A-Cath tip terminates over the cavoatrial junction.  Nasogastric tube terminates below the level of the diaphragms but the tip is  not included on the film.  Lung volumes are extremely low, with curvilinear bilateral lower lobe atelectasis.  Underlying pulmonary consolidation could be obscured. There is a suggestion of retrocardiac patchy airspace opacity but this is not well visualized.  No pleural effusion.  IMPRESSION: Extremely low lung volumes with bilateral lower lobe atelectasis and possible retrocardiac opacity.  PA and lateral chest radiograph at full inspiration are recommended when the patient is clinically able, for better evaluation.  Original Report Authenticated By: Harrel Lemon, M.D.   Dg Abd Acute W/chest  05/04/2011  *RADIOLOGY REPORT*  Clinical Data: Constipation with mid abdominal pain.  Uterine cancer.  ACUTE ABDOMEN SERIES (ABDOMEN 2 VIEW & CHEST 1 VIEW)  Comparison: 05/03/2011.  Findings: Frontal view of the chest shows a right IJ power port tip projecting over the SVC.  Nasogastric tube terminates in the stomach.  Trachea is midline.  Heart size stable.  Lungs are somewhat low in volume with minimal bibasilar atelectasis.  Two views of the abdomen show dilated loops of small bowel with associated air fluid levels.  Oral contrast and air fluid levels are seen in the ascending colon. Oral contrast is also seen in the rectosigmoid colon.  IMPRESSION: Bowel gas pattern suggests residual partial small bowel obstruction.  Original Report Authenticated By: Reyes Ivan, M.D.    Medications:    .  enoxaparin  40 mg Subcutaneous Q24H  . filgrastim  300 mcg Subcutaneous q1800    Assessment/Plan Partial SBO -  Endometrial carcinoma, status post cisplatin/Adriamycin 04/27/2011 with Neupogen support, okay to discontinue Neupogen if the white count remains elevated today Hypertension    Plan:  Continue NG drainage for now, we might try clamping trials later.  I have ask nursing to replace sump filter.  Tube not working real well when I came in.  Some gas, no nausea, no BM so far.  She would like to try  some clears in with tube still in and see how she does.      LOS: 6 days    Annalaura Sauseda 05/05/2011

## 2011-05-06 ENCOUNTER — Inpatient Hospital Stay (HOSPITAL_COMMUNITY): Payer: Medicaid Other

## 2011-05-06 DIAGNOSIS — K5909 Other constipation: Secondary | ICD-10-CM | POA: Diagnosis present

## 2011-05-06 DIAGNOSIS — D709 Neutropenia, unspecified: Secondary | ICD-10-CM | POA: Diagnosis present

## 2011-05-06 LAB — CBC
HCT: 27.2 % — ABNORMAL LOW (ref 36.0–46.0)
Hemoglobin: 8.4 g/dL — ABNORMAL LOW (ref 12.0–15.0)
MCHC: 30.9 g/dL (ref 30.0–36.0)

## 2011-05-06 LAB — DIFFERENTIAL
Basophils Absolute: 0 10*3/uL (ref 0.0–0.1)
Eosinophils Absolute: 0 10*3/uL (ref 0.0–0.7)
Lymphs Abs: 0.5 10*3/uL — ABNORMAL LOW (ref 0.7–4.0)
Monocytes Absolute: 0.1 10*3/uL (ref 0.1–1.0)
Monocytes Relative: 7 % (ref 3–12)
Neutro Abs: 0.6 10*3/uL — ABNORMAL LOW (ref 1.7–7.7)

## 2011-05-06 LAB — BASIC METABOLIC PANEL
BUN: 7 mg/dL (ref 6–23)
CO2: 26 mEq/L (ref 19–32)
Chloride: 105 mEq/L (ref 96–112)
GFR calc non Af Amer: 71 mL/min — ABNORMAL LOW (ref >90)
Glucose, Bld: 100 mg/dL — ABNORMAL HIGH (ref 70–99)
Potassium: 4.1 mEq/L (ref 3.5–5.1)

## 2011-05-06 MED ORDER — CLONIDINE HCL 0.2 MG/24HR TD PTWK
0.2000 mg | MEDICATED_PATCH | TRANSDERMAL | Status: DC
  Administered 2011-05-06 – 2011-05-20 (×3): 0.2 mg via TRANSDERMAL
  Filled 2011-05-06 (×3): qty 1

## 2011-05-06 MED ORDER — LACTULOSE 10 GM/15ML PO SOLN: 10.0000 g | Freq: Three times a day (TID) | ORAL | Status: DC | PRN

## 2011-05-06 NOTE — Progress Notes (Signed)
PROGRESS NOTE  Krystal [name] ZOX:[id number] DOB: 05/22/1970 DOA: 04/29/2011 PCP: [name], MD, MD  Brief narrative: Krystal [name] is a 41 year old female with a past medical history of endometrial cancer who was admitted on 04/29/2011 with abdominal cramping and a partial small bowel obstruction. She is being followed by surgery with no plans for surgical intervention at present.  Assessment/Plan: Principal Problem:  *Partial small bowel obstruction  Surgical consultation performed by Dr. Lodema Pilot on 05/03/11.  Surgical intervention felt to be only used as a last resort.  Treated conservatively with bowel rest and NG tube decompression of the stomach.  Reported having a bowel movement this morning. No flatus. Active Problems:  Endometrial ca  Status post cycle 1 cisplatin/Adriamycin.  Rapidly progressive per Dr. Precious Reel notes. Neutropenia  On Neupogen.  Empiric antibiotics for any fever. Chronic constipation  On daily MiraLAX.  Lactulose added 05/06/2011. Inadequate oral intake  We'll start TNA.  Hypertension  We'll start on a clonidine patch.  Was on Norvasc as an outpatient.  Acute renal failure/mild hydronephrosis  Creatinine 1.2 on admission  Steadily improved with IV fluids. Normocytic anemia  Likely from anemia of chronic disease and the sequela of chemotherapy.  Hemoglobin stable.  Code Status: Full Family Communication: Updated at bedside. Disposition Plan: Home, when stable.  Medical Consultants:  Dr. Jama Flavors, Oncology  Dr. Lodema Pilot, Surgery  Other consultants:  Pharmacy  Antibiotics:  None   Subjective  Krystal [name] states she has had a small bowel movement this morning but denies passing any flatus. Her abdomen cramps at times, but no frank pain. Some nausea. No vomiting with NG tube in.   Objective    Interim History: Stable overnight.   Objective: Filed Vitals:   05/05/11 1516 05/05/11 2241 05/06/11 0505  05/06/11 1430  BP: 164/101 154/95 141/95 131/86  Pulse: 84 84 84 94  Temp: 98.5 F (36.9 C) 99.7 F (37.6 C) 99.1 F (37.3 C) 98.8 F (37.1 C)  TempSrc: Oral Oral Oral Oral  Resp: 18 20 20 16   Height:      Weight:      SpO2: 100% 97% 97% 97%    Intake/Output Summary (Last 24 hours) at 05/06/11 1713 Last data filed at 05/06/11 0700  Gross per 24 hour  Intake    803 ml  Output    800 ml  Net      3 ml    Exam: Gen:  NAD, NG tube in place draining dark bilious fluid. Cardiovascular:  RRR, No M/R/G Respiratory: Lungs CTAB Gastrointestinal: Abdomen very distended with no significant bowel sounds. Extremities: No C/E/C    Data Reviewed: Basic Metabolic Panel:  Lab 05/06/11 8119 05/05/11 0915 05/04/11 1055 05/03/11 1400 05/01/11 0521  NA 139 136 137 141 139  K 4.1 3.7 -- -- --  CL 105 103 103 105 105  CO2 26 23 19 21 26   GLUCOSE 100* 103* 62* 65* 82  BUN 7 9 12 12 16   CREATININE 0.97 0.94 0.96 0.97 1.20*  CALCIUM 9.1 9.0 8.8 8.9 8.6  MG -- -- 1.7 -- --  PHOS -- -- -- -- --   GFR Estimated Creatinine Clearance: 85.9 ml/min (by C-G formula based on Cr of 0.97). Liver Function Tests:  Lab 04/30/11 0530  AST 20  ALT 20  ALKPHOS 82  BILITOT 0.4  PROT 6.9  ALBUMIN 3.2*    CBC:  Lab 05/06/11 0500 05/05/11 1125 05/04/11 1055 05/03/11 2310 05/03/11 1400 05/01/11 0521  WBC  1.2* 1.0* 1.7* 2.9* 5.3 --  NEUTROABS 0.6* -- 1.3* 2.4 4.8 13.8*  HGB 8.4* 9.0* 8.4* 8.5* 8.5* --  HCT 27.2* 28.7* 26.7* 27.1* 27.6* --  MCV 81.7 81.3 82.2 82.4 82.6 --  PLT 132* 162 176 178 193 --   Microbiology Recent Results (from the past 240 hour(s))  URINE CULTURE     Status: Normal   Collection Time   04/29/11 10:17 PM      Component Value Range Status Comment   Specimen Description URINE, CLEAN CATCH   Final    Special Requests none   Final    Culture  Setup Time [id number]   Final    Colony Count NO GROWTH   Final    Culture NO GROWTH   Final    Report Status 04/30/2011  FINAL   Final   URINE CULTURE     Status: Normal   Collection Time   05/03/11 10:27 PM      Component Value Range Status Comment   Specimen Description URINE, RANDOM   Final    Special Requests NONE   Final    Culture  Setup Time [id number]   Final    Colony Count 25,000 COLONIES/ML   Final    Culture     Final    Value: Multiple bacterial morphotypes present, none predominant. Suggest appropriate recollection if clinically indicated.   Report Status 05/05/2011 FINAL   Final   CULTURE, BLOOD (ROUTINE X 2)     Status: Normal (Preliminary result)   Collection Time   05/03/11 11:10 PM      Component Value Range Status Comment   Specimen Description BLOOD RIGHT PAC   Final    Special Requests BOTTLES DRAWN AEROBIC AND ANAEROBIC 5CC   Final    Culture  Setup Time [id number]   Final    Culture     Final    Value:        BLOOD CULTURE RECEIVED NO GROWTH TO DATE CULTURE WILL BE HELD FOR 5 DAYS BEFORE ISSUING A FINAL NEGATIVE REPORT   Report Status PENDING   Incomplete     Procedures and Diagnostic Studies:  Dg Abd 1 View 05/06/2011 IMPRESSION: Worsening dilatation of the small intestine consistent with worsening partial small bowel obstruction.  Original Report Authenticated By: Thomasenia Sales, M.D.    Dg Abd 1 View 05/03/2011 IMPRESSION:  1.  Persistent retained contrast.  Last contrast exam 04/29/2011. 2.  Persistent dilatation of small bowel loops, consistent with at least partial small bowel obstruction.  Original Report Authenticated By: Patterson Hammersmith, M.D.    Dg Abd 1 View 05/01/2011 IMPRESSION: Recurrent small bowel dilatation as demonstrated on recent CT and concerning for partial small-bowel obstruction, possibly on the basis of adhesions or peritoneal carcinomatosis.  Original Report Authenticated By: Gerrianne Scale, M.D.    Abd 1 View (kub) 04/30/2011  IMPRESSION: Normal stool and bowel gas pattern.  Original Report Authenticated By: Brandon Melnick, M.D.    Ct Abdomen  Pelvis W Contrast 04/29/2011 IMPRESSION: Dilated loops of small bowel in the left upper abdomen, compatible with at least early/partial small bowel obstruction, possibly on the basis of adhesions.  Additional suspected focal transition in the left mid abdomen.  Interval development of moderate abdominopelvic ascites with peritoneal nodularity, suggesting peritoneal disease.  Gastrohepatic and retroperitoneal nodal metastases, as described above.  Diminished enhancement of the left kidney relative to the right with mild left hydronephrosis, possibly on the basis of extrinsic  compression on the mid/lower ureter by peritoneal disease.  Midline lower pelvic ventral hernia containing a loop of small bowel. Original Report Authenticated By: Charline Bills, M.D.     Dg Chest Port 1 View 05/03/2011  IMPRESSION: Extremely low lung volumes with bilateral lower lobe atelectasis and possible retrocardiac opacity.  PA and lateral chest radiograph at full inspiration are recommended when the patient is clinically able, for better evaluation.  Original Report Authenticated By: Harrel Lemon, M.D.    Dg Abd Acute W/chest 05/04/2011 IMPRESSION: Bowel gas pattern suggests residual partial small bowel obstruction.  Original Report Authenticated By: Reyes Ivan, M.D.    Scheduled Meds:   . enoxaparin  40 mg Subcutaneous Q24H  . filgrastim  300 mcg Subcutaneous q1800   Continuous Infusions:   . dextrose 5 % and 0.9 % NaCl with KCl 40 mEq/L 100 mL/hr at 05/05/11 2327      LOS: 7 days   Hillery Aldo, MD Pager [phone]  05/06/2011, 5:13 PM

## 2011-05-06 NOTE — Progress Notes (Signed)
  Subjective: Back on suction, got distended and had nausea about 5:30 pm yesterday. Doesn't feel to bad now.  Objective: Vital signs in last 24 hours: Temp:  [98.5 F (36.9 C)-99.7 F (37.6 C)] 99.1 F (37.3 C) (04/24 0505) Pulse Rate:  [84] 84  (04/24 0505) Resp:  [18-20] 20  (04/24 0505) BP: (141-164)/(95-101) 141/95 mmHg (04/24 0505) SpO2:  [97 %-100 %] 97 % (04/24 0505) Last BM Date: 05/05/11  Intake/Output from previous day: 04/23 0701 - 04/24 0700 In: 2758 [P.O.:180; I.V.:2578] Out: 1860 [Urine:600; Emesis/NG output:1260] Intake/Output this shift:   1260 cc from NG recorded, low grade fever 99 range last PM, Labs shows  Drop in WBC, h/h, and platlets, KUB is pending.      General appearance: alert, cooperative and no distress GI: distended, soft, few bowel sounds, NG working well.  NO flatus, NO BM yesterday  Lab Results:   Basename 05/06/11 0500 05/05/11 1125  WBC 1.2* 1.0*  HGB 8.4* 9.0*  HCT 27.2* 28.7*  PLT 132* 162    BMET  Basename 05/06/11 0500 05/05/11 0915  NA 139 136  K 4.1 3.7  CL 105 103  CO2 26 23  GLUCOSE 100* 103*  BUN 7 9  CREATININE 0.97 0.94  CALCIUM 9.1 9.0   PT/INR No results found for this basename: LABPROT:2,INR:2 in the last 72 hours   Lab 04/30/11 0530  AST 20  ALT 20  ALKPHOS 82  BILITOT 0.4  PROT 6.9  ALBUMIN 3.2*     Lipase     Component Value Date/Time   LIPASE 18 04/29/2011 0326     Studies/Results: Dg Abd Acute W/chest  05/04/2011  *RADIOLOGY REPORT*  Clinical Data: Constipation with mid abdominal pain.  Uterine cancer.  ACUTE ABDOMEN SERIES (ABDOMEN 2 VIEW & CHEST 1 VIEW)  Comparison: 05/03/2011.  Findings: Frontal view of the chest shows a right IJ power port tip projecting over the SVC.  Nasogastric tube terminates in the stomach.  Trachea is midline.  Heart size stable.  Lungs are somewhat low in volume with minimal bibasilar atelectasis.  Two views of the abdomen show dilated loops of small bowel with  associated air fluid levels.  Oral contrast and air fluid levels are seen in the ascending colon. Oral contrast is also seen in the rectosigmoid colon.  IMPRESSION: Bowel gas pattern suggests residual partial small bowel obstruction.  Original Report Authenticated By: Reyes Ivan, M.D.    Medications:    . enoxaparin  40 mg Subcutaneous Q24H  . filgrastim  300 mcg Subcutaneous q1800    Assessment/Plan Partial SBO -  Endometrial carcinoma, status post cisplatin/Adriamycin 04/27/2011 with Neupogen support, okay to discontinue Neupogen if the white count remains elevated today  Hypertension Pancytopenia   Plan:  She is back on suction, continue bowel rest, hydration, for now.   DR. Darrold Span from Oncology following also.       LOS: 7 days    Breckyn Ticas 05/06/2011

## 2011-05-06 NOTE — Progress Notes (Signed)
Patient feels better this afternoon.  Had a small bowel movement just a little while ago.  Abdomen remains distended with hypoactive bowel sounds.  Plain film shows dilated SB with air in colon   Imp:  Malignant partial SBO - waxing and waning Plan:  Continue NG suction for now.  Consider PICC/ TNA for nutrition.  Surgery should be a last option.  Wilmon Arms. Corliss Skains, MD, [facility] Surgery  05/06/2011 2:41 PM

## 2011-05-06 NOTE — Progress Notes (Signed)
05/06/2011, 8:19 AM  Hospital day: 8 Antibiotics: none Chemotherapy: day 10 cycle 1 cisplatin/ adriamycin Neupogen day 7 today Subjective: Patient seen, husband at bedside. Tolerated NG clamped until late afternoon yesterday, then "full" without N/V and has been more comfortable since NG back to suction. Did have some ice chips while NG clamped. No BM in 24 hrs. Walked in hall x4 yesterday. Felt weak yesterday and again today, may be in part with neutropenia. Pain as she completes voiding. Did sleep well. Chloroseptic spray helps NG discomfort. Fax of letter for mother's VISA did not suffice and they have sent the original. We discussed ok to do sips of liquids while NG is to suction. Objective: Vital signs in last 24 hours: Blood pressure 141/95, pulse 84, temperature 99.1 F (37.3 C), temperature source Oral, resp. rate 20, height 5\' 6"  (1.676 m), weight 205 lb (92.987 kg), last menstrual period 07/02/2010, SpO2 97.00%. IV D5NS + 40 KCl at 100. Awake and alert, looks mildly uncomfortable from abdomen. PERRL. Mouth clear and moist. Clear fluid in NG tubing, dark in canister. Lungs clear. PAC site fine. Abdomen more distended, quiet, not discretely tender, not tight. LE no edema/ cords. Moves easily in bed. Intake/Output from previous day: 04/23 0701 - 04/24 0700 In: 2758 [P.O.:180; I.V.:2578] Out: 1860 [Urine:600; Emesis/NG output:1260] Intake/Output this shift:      Lab Results:  Basename 05/06/11 0500 05/05/11 1125  WBC 1.2* 1.0*  HGB 8.4* 9.0*  HCT 27.2* 28.7*  PLT 132* 162  ANC 0.6 BMET  Basename 05/06/11 0500 05/05/11 0915  NA 139 136  K 4.1 3.7  CL 105 103  CO2 26 23  GLUCOSE 100* 103*  BUN 7 9  CREATININE 0.97 0.94  CALCIUM 9.1 9.0    Studies/Results: Dg Abd Acute W/chest  05/04/2011  *RADIOLOGY REPORT*  Clinical Data: Constipation with mid abdominal pain.  Uterine cancer.  ACUTE ABDOMEN SERIES (ABDOMEN 2 VIEW & CHEST 1 VIEW)  Comparison: 05/03/2011.  Findings:  Frontal view of the chest shows a right IJ power port tip projecting over the SVC.  Nasogastric tube terminates in the stomach.  Trachea is midline.  Heart size stable.  Lungs are somewhat low in volume with minimal bibasilar atelectasis.  Two views of the abdomen show dilated loops of small bowel with associated air fluid levels.  Oral contrast and air fluid levels are seen in the ascending colon. Oral contrast is also seen in the rectosigmoid colon.  IMPRESSION: Bowel gas pattern suggests residual partial small bowel obstruction.  Original Report Authenticated By: Reyes Ivan, M.D.   KUB ordered now.  Discussed neutropenia related to chemo; no suppositories while neutropenic. Continue neupogen. Discussed G-tube for stomach decompression if obstruction does not resolve; obviously would not want to do this while she is neutropenic. Husband is worried re no nutrition. I did not discuss TNA, as that would not be expected to benefit overall situation with cancer (unless short term around a surgery).   Assessment/Plan: 1.High grade endometrial carcinoma rapidly progressive just after completing initial chemo/RT. Counts nadiring from first CDDP/adria, on neupogen. Neutropenic precautions, will need antibiotics if febrile. 2.bowel obstruction related to recurrent endometrial carcinoma. Unfortunately no improvement in last 24 hrs. NG back to suction. Not stable for care outside of hospital now. 3. Severe, chronic constipation since childhood. Needed daily miralax even when no other factors contributing. I have written lactulose/ clamp NG as prn now. 4. PAC in 5.difficult social situation: not [place] citizen, no insurance, 3 young children,  mother in [place] trying to get VISA.   LIVESAY,LENNIS P

## 2011-05-07 LAB — DIFFERENTIAL
Basophils Absolute: 0 10*3/uL (ref 0.0–0.1)
Basophils Relative: 0 % (ref 0–1)
Eosinophils Absolute: 0 10*3/uL (ref 0.0–0.7)
Lymphocytes Relative: 45 % (ref 12–46)
Lymphs Abs: 0.6 10*3/uL — ABNORMAL LOW (ref 0.7–4.0)
Neutro Abs: 0.5 10*3/uL — ABNORMAL LOW (ref 1.7–7.7)

## 2011-05-07 LAB — MAGNESIUM: Magnesium: 1.7 mg/dL (ref 1.5–2.5)

## 2011-05-07 LAB — GLUCOSE, CAPILLARY: Glucose-Capillary: 90 mg/dL (ref 70–99)

## 2011-05-07 LAB — COMPREHENSIVE METABOLIC PANEL
BUN: 7 mg/dL (ref 6–23)
Calcium: 9.1 mg/dL (ref 8.4–10.5)
Creatinine, Ser: 1.03 mg/dL (ref 0.50–1.10)
GFR calc Af Amer: 76 mL/min — ABNORMAL LOW (ref >90)
GFR calc non Af Amer: 66 mL/min — ABNORMAL LOW (ref >90)
Glucose, Bld: 94 mg/dL (ref 70–99)
Total Protein: 6.7 g/dL (ref 6.0–8.3)

## 2011-05-07 LAB — CBC
HCT: 27 % — ABNORMAL LOW (ref 36.0–46.0)
Hemoglobin: 8.3 g/dL — ABNORMAL LOW (ref 12.0–15.0)
MCH: 25.3 pg — ABNORMAL LOW (ref 26.0–34.0)
MCHC: 30.7 g/dL (ref 30.0–36.0)
MCV: 82.3 fL (ref 78.0–100.0)

## 2011-05-07 MED ORDER — INSULIN ASPART 100 UNIT/ML ~~LOC~~ SOLN
0.0000 [IU] | SUBCUTANEOUS | Status: DC
  Administered 2011-05-10 – 2011-05-16 (×18): 1 [IU] via SUBCUTANEOUS

## 2011-05-07 MED ORDER — FAT EMULSION 20 % IV EMUL
240.0000 mL | INTRAVENOUS | Status: AC
  Administered 2011-05-07: 240 mL via INTRAVENOUS
  Filled 2011-05-07: qty 250

## 2011-05-07 MED ORDER — KCL IN DEXTROSE-NACL 40-5-0.9 MEQ/L-%-% IV SOLN
INTRAVENOUS | Status: DC
  Filled 2011-05-07 (×2): qty 1000

## 2011-05-07 MED ORDER — CLINIMIX E/DEXTROSE (5/15) 5 % IV SOLN
INTRAVENOUS | Status: AC
  Administered 2011-05-07: 17:00:00 via INTRAVENOUS
  Filled 2011-05-07: qty 1000

## 2011-05-07 MED ORDER — HYDROMORPHONE HCL PF 1 MG/ML IJ SOLN
1.0000 mg | Freq: Once | INTRAMUSCULAR | Status: AC
  Administered 2011-05-07: 1 mg via INTRAMUSCULAR
  Filled 2011-05-07: qty 1

## 2011-05-07 MED ORDER — LACTULOSE 10 GM/15ML PO SOLN
30.0000 g | Freq: Three times a day (TID) | ORAL | Status: DC
  Administered 2011-05-07 – 2011-05-13 (×11): 30 g via ORAL
  Filled 2011-05-07 (×22): qty 45

## 2011-05-07 NOTE — Progress Notes (Signed)
PROGRESS NOTE  Krystal [name] ZOX:[id number] DOB: [birth year] DOA: 04/29/2011 PCP: [name], MD, MD  Brief narrative: Krystal [name] is a 41 year old female with a past medical history of endometrial cancer who was admitted on 04/29/2011 with abdominal cramping and a partial small bowel obstruction. She is being followed by surgery with no plans for surgical intervention at present.  Assessment/Plan: Principal Problem:  *Partial small bowel obstruction  Surgical consultation performed by Dr. Lodema Pilot on 05/03/11.  Surgical intervention felt to be only used as a last resort.  Treated conservatively with bowel rest and NG tube decompression of the stomach.  Reported having a bowel movement 05/06/11. Active Problems:  Endometrial ca  Status post cycle 1 cisplatin/Adriamycin.  Rapidly progressive per Dr. Precious Reel notes. Neutropenia / thrombocytopenia  Related to prior chemotherapy.   On Neupogen to support white blood cell count recovery. Defer to Dr. Darrold Span as to when to stop this medication.  Empiric antibiotics for any fever.  No evidence of bleeding. Chronic constipation  On daily MiraLAX.  Lactulose added 05/06/2011. Inadequate oral intake  TNA. to start tonight.  Hypertension  Started on a clonidine patch for/24/2013 with subsequent good blood pressure control.  Was on Norvasc as an outpatient.  Acute renal failure/mild hydronephrosis  Creatinine 1.2 on admission  Steadily improved with IV fluids. Normocytic anemia  Likely from anemia of chronic disease and the sequela of chemotherapy.  Hemoglobin stable.  Code Status: Full Family Communication: Updated at bedside. Disposition Plan: Home, when stable.  Medical Consultants:  Dr. Jama Flavors, Oncology  Dr. Lodema Pilot, Surgery  Other consultants:  Pharmacy  Antibiotics:  None   Subjective  Krystal [name] states she is passing flatus and had 2 small bowel movements this morning. Denies  any abdominal pain. No vomiting. The NG tube remains in place to low suction.   Objective    Interim History: Stable overnight.   Objective: Filed Vitals:   05/06/11 1430 05/06/11 2110 05/07/11 0455 05/07/11 1435  BP: 131/86 146/92 130/85 121/83  Pulse: 94 85 82 96  Temp: 98.8 F (37.1 C) 98 F (36.7 C) 99.6 F (37.6 C) 98.7 F (37.1 C)  TempSrc: Oral Oral Oral Oral  Resp: 16 17 16 16   Height:      Weight:      SpO2: 97% 99% 97% 98%    Intake/Output Summary (Last 24 hours) at 05/07/11 1554 Last data filed at 05/07/11 1208  Gross per 24 hour  Intake   3428 ml  Output    700 ml  Net   2728 ml    Exam: Gen:  NAD, NG tube in place draining brown bilious fluid. Cardiovascular:  RRR, No M/R/G Respiratory: Lungs CTAB Gastrointestinal: Abdomen very distended with sluggish bowel sounds. Extremities: No C/E/C    Data Reviewed: Basic Metabolic Panel:  Lab 05/07/11 8119 05/06/11 0500 05/05/11 0915 05/04/11 1055 05/03/11 1400  NA 139 139 136 137 141  K 4.3 4.1 -- -- --  CL 104 105 103 103 105  CO2 27 26 23 19 21   GLUCOSE 94 100* 103* 62* 65*  BUN 7 7 9 12 12   CREATININE 1.03 0.97 0.94 0.96 0.97  CALCIUM 9.1 9.1 9.0 8.8 8.9  MG 1.7 -- -- 1.7 --  PHOS 3.5 -- -- -- --   GFR Estimated Creatinine Clearance: 80.9 ml/min (by C-G formula based on Cr of 1.03). Liver Function Tests:  Lab 05/07/11 0500  AST 25  ALT 20  ALKPHOS 96  BILITOT 0.7  PROT 6.7  ALBUMIN 2.8*    CBC:  Lab 05/07/11 0500 05/06/11 0500 05/05/11 1125 05/04/11 1055 05/03/11 2310 05/03/11 1400  WBC 1.3* 1.2* 1.0* 1.7* 2.9* --  NEUTROABS 0.5* 0.6* -- 1.3* 2.4 4.8  HGB 8.3* 8.4* 9.0* 8.4* 8.5* --  HCT 27.0* 27.2* 28.7* 26.7* 27.1* --  MCV 82.3 81.7 81.3 82.2 82.4 --  PLT 116* 132* 162 176 178 --   Microbiology Recent Results (from the past 240 hour(s))  URINE CULTURE     Status: Normal   Collection Time   04/29/11 10:17 PM      Component Value Range Status Comment   Specimen Description  URINE, CLEAN CATCH   Final    Special Requests none   Final    Culture  Setup Time [id number]   Final    Colony Count NO GROWTH   Final    Culture NO GROWTH   Final    Report Status 04/30/2011 FINAL   Final   URINE CULTURE     Status: Normal   Collection Time   05/03/11 10:27 PM      Component Value Range Status Comment   Specimen Description URINE, RANDOM   Final    Special Requests NONE   Final    Culture  Setup Time [id number]   Final    Colony Count 25,000 COLONIES/ML   Final    Culture     Final    Value: Multiple bacterial morphotypes present, none predominant. Suggest appropriate recollection if clinically indicated.   Report Status 05/05/2011 FINAL   Final   CULTURE, BLOOD (ROUTINE X 2)     Status: Normal (Preliminary result)   Collection Time   05/03/11 11:10 PM      Component Value Range Status Comment   Specimen Description BLOOD RIGHT PAC   Final    Special Requests BOTTLES DRAWN AEROBIC AND ANAEROBIC 5CC   Final    Culture  Setup Time [id number]   Final    Culture     Final    Value:        BLOOD CULTURE RECEIVED NO GROWTH TO DATE CULTURE WILL BE HELD FOR 5 DAYS BEFORE ISSUING A FINAL NEGATIVE REPORT   Report Status PENDING   Incomplete     Procedures and Diagnostic Studies:  Dg Abd 1 View 05/06/2011 IMPRESSION: Worsening dilatation of the small intestine consistent with worsening partial small bowel obstruction.  Original Report Authenticated By: Thomasenia Sales, M.D.    Dg Abd 1 View 05/03/2011 IMPRESSION:  1.  Persistent retained contrast.  Last contrast exam 04/29/2011. 2.  Persistent dilatation of small bowel loops, consistent with at least partial small bowel obstruction.  Original Report Authenticated By: Patterson Hammersmith, M.D.    Dg Abd 1 View 05/01/2011 IMPRESSION: Recurrent small bowel dilatation as demonstrated on recent CT and concerning for partial small-bowel obstruction, possibly on the basis of adhesions or peritoneal carcinomatosis.  Original  Report Authenticated By: Gerrianne Scale, M.D.    Abd 1 View (kub) 04/30/2011  IMPRESSION: Normal stool and bowel gas pattern.  Original Report Authenticated By: Brandon Melnick, M.D.    Ct Abdomen Pelvis W Contrast 04/29/2011 IMPRESSION: Dilated loops of small bowel in the left upper abdomen, compatible with at least early/partial small bowel obstruction, possibly on the basis of adhesions.  Additional suspected focal transition in the left mid abdomen.  Interval development of moderate abdominopelvic ascites with peritoneal nodularity, suggesting peritoneal disease.  Gastrohepatic and retroperitoneal nodal  metastases, as described above.  Diminished enhancement of the left kidney relative to the right with mild left hydronephrosis, possibly on the basis of extrinsic compression on the mid/lower ureter by peritoneal disease.  Midline lower pelvic ventral hernia containing a loop of small bowel. Original Report Authenticated By: Charline Bills, M.D.     Dg Chest Port 1 View 05/03/2011  IMPRESSION: Extremely low lung volumes with bilateral lower lobe atelectasis and possible retrocardiac opacity.  PA and lateral chest radiograph at full inspiration are recommended when the patient is clinically able, for better evaluation.  Original Report Authenticated By: Harrel Lemon, M.D.    Dg Abd Acute W/chest 05/04/2011 IMPRESSION: Bowel gas pattern suggests residual partial small bowel obstruction.  Original Report Authenticated By: Reyes Ivan, M.D.    Scheduled Meds:    . cloNIDine  0.2 mg Transdermal Weekly  . enoxaparin  40 mg Subcutaneous Q24H  . filgrastim  300 mcg Subcutaneous q1800  . insulin aspart  0-9 Units Subcutaneous Q4H  . lactulose  30 g Oral TID   Continuous Infusions:    . dextrose 5 % and 0.9 % NaCl with KCl 40 mEq/L 100 mL/hr at 05/07/11 0657  . dextrose 5 % and 0.9 % NaCl with KCl 40 mEq/L    . TPN (CLINIMIX) +/- additives     And  . fat emulsion        LOS: 8  days   Hillery Aldo, MD Pager [phone]  05/07/2011, 3:54 PM  Patient teaching/information given to the patient and reviewed with the patient:  NITHILA SUMNERS 05/07/2011  Treatment team:  Dr. Hillery Aldo, Hospitalist (Internist)  Dr. Jama Flavors, Oncologist  Dr. Lodema Pilot (and partners), Surgeon  Medical Issues and plan: Principal Problem:  *Blockage of the bowel  Surgical consultation performed by Dr. Lodema Pilot on 05/03/11.  Surgical intervention felt to be only used as a last resort.  Treated conservatively with bowel rest and NG tube decompression of the stomach.  Reported having a bowel movement 05/06/11. Active Problems:  Endometrial cancer  Status post chemotherapy.  Further treatment per Dr. Darrold Span. Low white cells and platelets  Related to prior chemotherapy. Low white cells can put you at risk for infection and low platelets can cause problems with bleeding.  On Neupogen to support white blood cell count recovery.   We'll start antibiotics if you have any fever..  No evidence of bleeding. Chronic constipation  On daily MiraLAX.  Lactulose added 05/06/2011. Inadequate oral intake  TNA. to start tonight. High blood pressure  Started on a clonidine patch for/24/2013 with subsequent good blood pressure control.  Was on Norvasc as an outpatient, but this is on hold since you have an NG tube in. Mild dehydration  Steadily improved with IV fluids. Low red cells  Likely from anemia of chronic disease and the sequela of chemotherapy.  Hemoglobin stable.  Anticipated discharge date: Depends on progress.

## 2011-05-07 NOTE — Progress Notes (Signed)
Nutrition Follow-up  Diet Order: Clear liquid  - Partial SBO appeared to have been resolved on 4/19 however pt started to have multiple episodes of emesis later that day and NGT placed on 4/20. Pt with small BM on 4/21 and abdominal distention improved on 4/22. Nausea improved on 4/23 and NGT was clamped, however pt stated she felt full with abdominal distention and nausea and requested to be hooked back up to NGT low intermittent suction. Pt with small BM 4/24. Met with pt this morning who stated she was nauseated and had just vomited a small amount which was white in color. Pt reports feeling better after vomiting. Pt states she is tolerating the clear liquids well, it was the medication, Lactulose, that made her vomit. Pt refusing Resource Breeze. DG of the abdomen showed worsening dilatation of the small intestine consistent with worsening partial small bowel obstruction. Pt to start TNA tonight. Surgery has been consulted and they felt like surgery should be used only as a last resort.   Meds: Scheduled Meds:   . cloNIDine  0.2 mg Transdermal Weekly  . enoxaparin  40 mg Subcutaneous Q24H  . filgrastim  300 mcg Subcutaneous q1800  . insulin aspart  0-9 Units Subcutaneous Q4H  . lactulose  30 g Oral TID   Continuous Infusions:   . dextrose 5 % and 0.9 % NaCl with KCl 40 mEq/L 100 mL/hr at 05/07/11 0657  . dextrose 5 % and 0.9 % NaCl with KCl 40 mEq/L    . TPN (CLINIMIX) +/- additives     And  . fat emulsion     PRN Meds:.acetaminophen, albuterol, guaiFENesin-dextromethorphan, HYDROmorphone, ipratropium, LORazepam, ondansetron (ZOFRAN) IV, ondansetron, oxyCODONE, phenol, DISCONTD: lactulose  Labs:  CMP     Component Value Date/Time   NA 139 05/07/2011 0500   K 4.3 05/07/2011 0500   CL 104 05/07/2011 0500   CO2 27 05/07/2011 0500   GLUCOSE 94 05/07/2011 0500   BUN 7 05/07/2011 0500   CREATININE 1.03 05/07/2011 0500   CREATININE 0.55 10/17/2009 1300   CALCIUM 9.1 05/07/2011 0500   PROT 6.7  05/07/2011 0500   ALBUMIN 2.8* 05/07/2011 0500   AST 25 05/07/2011 0500   ALT 20 05/07/2011 0500   ALKPHOS 96 05/07/2011 0500   BILITOT 0.7 05/07/2011 0500   GFRNONAA 66* 05/07/2011 0500   GFRAA 76* 05/07/2011 0500     Intake/Output Summary (Last 24 hours) at 05/07/11 1038 Last data filed at 05/07/11 0657  Gross per 24 hour  Intake   3188 ml  Output    700 ml  Net   2488 ml   Last BM - 05/06/11 per MD notes  NGT output - total yesterday   Weight Status: No new weights  Re-estimated needs: No changes. 1750-2050 calories 70-90g protein  Nutrition Dx: Inadequate oral intake - ongoing  Goal: Diet advancement - not met  New goal: TNA to meet >90% of estimated nutritional needs.   Intervention: TNA per pharmacy. Anti-emetics per MD, recommending changing to scheduled versus PRN. Bowel regimen per MD. Recommend re-weigh pt.   Monitor: Weights, labs, NGT output, BM, diet advancement, nausea, vomiting    Marshall Cork Pager #: [phone]

## 2011-05-07 NOTE — Progress Notes (Signed)
  05/07/2011, 8:03 AM  Hospital day: 9 Antibiotics: none Chemotherapy: day 11 cycle 1 cisplatin/ adriamycin Neupogen day 8   Subjective: Up in chair, no visitors now. Feeling a little better today. Small BM yesterday evening. Denies pain or nausea now. NG not presently to suction.  Objective: Vital signs in last 24 hours: Blood pressure 130/85, pulse 82, temperature 99.6 F (37.6 C), temperature source Oral, resp. rate 16, height 5\' 6"  (1.676 m), weight 205 lb (92.987 kg), last menstrual period 07/02/2010, SpO2 97.00%. Awake and alert, moves easily in chair. Mouth moist without lesions. PAC infuising without difficulty. Cor RRR. Lungs without wheezes or rales. Abdomen distended, quiet, not tender. LE no edema, cords, tenderness. Intake/Output from previous day: 04/24 0701 - 04/25 0700 In: 3188 [P.O.:100; I.V.:3088] Out: 700 [Emesis/NG output:700] Intake/Output this shift:      Lab Results:  Basename 05/07/11 0500 05/06/11 0500  WBC 1.3* 1.2*  HGB 8.3* 8.4*  HCT 27.0* 27.2*  PLT 116* 132*  ANC 0.5 BMET  Basename 05/07/11 0500 05/06/11 0500  NA 139 139  K 4.3 4.1  CL 104 105  CO2 27 26  GLUCOSE 94 100*  BUN 7 7  CREATININE 1.03 0.97  CALCIUM 9.1 9.1    Studies/Results: Dg Abd 1 View  05/06/2011  *RADIOLOGY REPORT*  Clinical Data: Follow-up small bowel obstruction  ABDOMEN - 1 VIEW  Comparison: 05/04/2011  Findings: Nasogastric tube has its tip in the midportion of the stomach.  There is persistent and worsening small bowel dilatation consistent with worsening partial small bowel obstruction.  Some air and contrast are present in the colon.  IMPRESSION: Worsening dilatation of the small intestine consistent with worsening partial small bowel obstruction.  Original Report Authenticated By: Thomasenia Sales, M.D.     Assessment/Plan: 1.Recurrent endometrial carcinoma, which developed since primary chemotherapy completed in mid-Jan, now with bowel obstruction which  developed just after first cycle of cisplatin/adriamycin. She is neutropenic with neupogen ongoing, and platelets are still dropping. 2.chronic,severe constipation since childhood: Did not try prn lactulose yesterday and I will change this to tid via NG if tolerates. 3.PAC in I have completed FMLA papers for husband.  Chasady Longwell P

## 2011-05-07 NOTE — Progress Notes (Signed)
TNA begun at 1800, leaving no iv access for maintenance iv fluid. IVTeam expert RN Prudencio Burly, unable to start iv x2, discussed with MD Rama, pt, and PharmD Terri, who agreed to continue TNA and hold [id number]. Merlyn Albert Charity fundraiser

## 2011-05-07 NOTE — Progress Notes (Signed)
Patient ID: Krystal [name], female   DOB: 01-10-1971, 41 y.o.   MRN: [id number]    Subjective: 1100 ml NGT OP last 24 hours.  Pt reports small BM yesterday am, but not much flatus or BM since then. She reports she does feel a little stronger today compared with yesterday.  She feels like she may need to have another BM this am.   Objective: Vital signs in last 24 hours: Temp:  [98 F (36.7 C)-99.6 F (37.6 C)] 99.6 F (37.6 C) (04/25 0455) Pulse Rate:  [82-94] 82  (04/25 0455) Resp:  [16-17] 16  (04/25 0455) BP: (130-146)/(85-92) 130/85 mmHg (04/25 0455) SpO2:  [97 %-99 %] 97 % (04/25 0455) Last BM Date: 05/05/11  Intake/Output from previous day: 04/24 0701 - 04/25 0700 In: 3188 [P.O.:100; I.V.:3088] Out: 700 [Emesis/NG output:700] Intake/Output this shift:   1100 ml Dk bilious NGT OP, low grade fever, pancytopenia persist  General appearance: alert, cooperative and no distress GI: distended, soft,very few bowel sounds, NG working well.  NO flatus, NO BM since yesterday  Lab Results:   Basename 05/07/11 0500 05/06/11 0500  WBC 1.3* 1.2*  HGB 8.3* 8.4*  HCT 27.0* 27.2*  PLT PENDING 132*    BMET  Basename 05/07/11 0500 05/06/11 0500  NA 139 139  K 4.3 4.1  CL 104 105  CO2 27 26  GLUCOSE 94 100*  BUN 7 7  CREATININE 1.03 0.97  CALCIUM 9.1 9.1   PT/INR No results found for this basename: LABPROT:2,INR:2 in the last 72 hours   Lab 05/07/11 0500  AST 25  ALT 20  ALKPHOS 96  BILITOT 0.7  PROT 6.7  ALBUMIN 2.8*     Lipase     Component Value Date/Time   LIPASE 18 04/29/2011 0326     Studies/Results: Dg Abd 1 View  05/06/2011  *RADIOLOGY REPORT*  Clinical Data: Follow-up small bowel obstruction  ABDOMEN - 1 VIEW  Comparison: 05/04/2011  Findings: Nasogastric tube has its tip in the midportion of the stomach.  There is persistent and worsening small bowel dilatation consistent with worsening partial small bowel obstruction.  Some air and contrast are  present in the colon.  IMPRESSION: Worsening dilatation of the small intestine consistent with worsening partial small bowel obstruction.  Original Report Authenticated By: Thomasenia Sales, M.D.    Medications:    . cloNIDine  0.2 mg Transdermal Weekly  . enoxaparin  40 mg Subcutaneous Q24H  . filgrastim  300 mcg Subcutaneous q1800    Assessment/Plan Partial SBO -  Endometrial carcinoma, status post cisplatin/Adriamycin 04/27/2011 with Neupogen support, okay to discontinue Neupogen if the white count remains elevated today  Hypertension Pancytopenia   Plan:  Conitnue NGT on suction, continue bowel rest, and  Hydration.  DR. Darrold Span from Oncology following also. Will continue to follow with you.        LOS: 8 days    [place] Delauder,PA-C Pager [phone] General Trauma Pager [phone]

## 2011-05-07 NOTE — Progress Notes (Signed)
PARENTERAL NUTRITION CONSULT NOTE - INITIAL  Pharmacy Consult for TNA Indication: pSBO  No Known Allergies  Patient Measurements: Height: 5\' 6"  (167.6 cm) Weight: 205 lb (92.987 kg) IBW/kg (Calculated) : 59.3  Adjusted Body Weight: 69 kg  Vital Signs: Temp: 99.6 F (37.6 C) (04/25 0455) Temp src: Oral (04/25 0455) BP: 130/85 mmHg (04/25 0455) Pulse Rate: 82  (04/25 0455) Intake/Output from previous day: 04/24 0701 - 04/25 0700 In: 3188 [P.O.:100; I.V.:3088] Out: 700 [Emesis/NG output:700] Intake/Output from this shift:    Labs:  Basename 05/07/11 0500 05/06/11 0500 05/05/11 1125  WBC 1.3* 1.2* 1.0*  HGB 8.3* 8.4* 9.0*  HCT 27.0* 27.2* 28.7*  PLT 116* 132* 162  APTT -- -- --  INR -- -- --     Basename 05/07/11 0500 05/06/11 0500 05/05/11 0915 05/04/11 1055  NA 139 139 136 --  K 4.3 4.1 3.7 --  CL 104 105 103 --  CO2 27 26 23  --  GLUCOSE 94 100* 103* --  BUN 7 7 9  --  CREATININE 1.03 0.97 0.94 --  LABCREA -- -- -- --  CREAT24HRUR -- -- -- --  CALCIUM 9.1 9.1 9.0 --  MG 1.7 -- -- 1.7  PHOS 3.5 -- -- --  PROT 6.7 -- -- --  ALBUMIN 2.8* -- -- --  AST 25 -- -- --  ALT 20 -- -- --  ALKPHOS 96 -- -- --  BILITOT 0.7 -- -- --  BILIDIR -- -- -- --  IBILI -- -- -- --  PREALBUMIN -- -- -- --  TRIG -- -- -- --  CHOLHDL -- -- -- --  CHOL -- -- -- --   Estimated Creatinine Clearance: 80.9 ml/min (by C-G formula based on Cr of 1.03).   No results found for this basename: GLUCAP:3 in the last 72 hours  Medical History: Past Medical History  Diagnosis Date  . Hypertension     SINCE FIRST PREGNANCY  . Asthma   . Constipation   . History of miscarriage   . Preeclampsia   . Postoperative ileus 07/2010  . Iron deficiency anemia     s/p transfusions [facility]  . Uterine cancer     Medications:  Scheduled:    . cloNIDine  0.2 mg Transdermal Weekly  . enoxaparin  40 mg Subcutaneous Q24H  . filgrastim  300 mcg Subcutaneous q1800  . lactulose  30 g Oral TID    Infusions:    . dextrose 5 % and 0.9 % NaCl with KCl 40 mEq/L 100 mL/hr at 05/07/11 0657    Insulin Requirements in the past 24 hours:  To start SSI today after TNA is started later  Current Nutrition:  CL diet currently  Assessment:  41 yo female with past medical history of endometrial cancer admitted on 4/17 with abdominal cramping and a partial SBO. No current plans for surgical intervention. To begin TNA today due to prolonged inadequate oral intake.   Labs currently stable this AM  Nutritional Goals:  Per RD: Kcal goals: 1750-2050/day, Protein 70-90g/day, Fluid 1.7-2L/day  Clinimix E 5/15: goal rate 80 ml/hr (1915ml/day) kCal goals including lipid administration = 1843 kcal/day Protein goals = 96 g protein/day  Plan:  1. Start Clinimix E 5/15 tonight at 6pm at a rate of 40 ml/hr with a goal of 70ml/hr as stated above 2. Lipids will be run daily at a rate of 10 ml/hr 3. Due to national shortages, MVI and trace elements will be added to the TNA on  MWF 4. TNA labs Mon/Thurs 5. Reduce maintenance IV fluids to 50 ml/hr when TNA starts tonight   Hessie Knows, PharmD, BCPS Pager [phone] 05/07/2011 8:20 AM

## 2011-05-07 NOTE — Progress Notes (Signed)
BM today. No nausea with NG clamped.  Leave clamped.   Possible d/c NG tube tomorrow.  Wilmon Arms. Corliss Skains, MD, [facility] Surgery  05/07/2011 5:36 PM

## 2011-05-07 NOTE — Plan of Care (Signed)
Problem: Inadequate Intake (NI-2.1) Goal: Food and/or nutrient delivery Individualized approach for food/nutrient provision.  Outcome: Not Progressing Diet not advanced, pt on clear liquids, scheduled to start TNA tonight

## 2011-05-08 LAB — CBC
Platelets: 115 10*3/uL — ABNORMAL LOW (ref 150–400)
RBC: 3.41 MIL/uL — ABNORMAL LOW (ref 3.87–5.11)
RDW: 15.5 % (ref 11.5–15.5)
WBC: 2.2 10*3/uL — ABNORMAL LOW (ref 4.0–10.5)

## 2011-05-08 LAB — MAGNESIUM: Magnesium: 1.8 mg/dL (ref 1.5–2.5)

## 2011-05-08 LAB — GLUCOSE, CAPILLARY
Glucose-Capillary: 100 mg/dL — ABNORMAL HIGH (ref 70–99)
Glucose-Capillary: 105 mg/dL — ABNORMAL HIGH (ref 70–99)
Glucose-Capillary: 112 mg/dL — ABNORMAL HIGH (ref 70–99)
Glucose-Capillary: 99 mg/dL (ref 70–99)

## 2011-05-08 LAB — COMPREHENSIVE METABOLIC PANEL
ALT: 34 U/L (ref 0–35)
AST: 35 U/L (ref 0–37)
Albumin: 2.9 g/dL — ABNORMAL LOW (ref 3.5–5.2)
CO2: 28 mEq/L (ref 19–32)
Calcium: 9.1 mg/dL (ref 8.4–10.5)
GFR calc non Af Amer: 56 mL/min — ABNORMAL LOW (ref >90)
Sodium: 138 mEq/L (ref 135–145)

## 2011-05-08 LAB — DIFFERENTIAL
Basophils Absolute: 0 10*3/uL (ref 0.0–0.1)
Eosinophils Relative: 1 % (ref 0–5)
Monocytes Absolute: 0.3 10*3/uL (ref 0.1–1.0)
Monocytes Relative: 13 % — ABNORMAL HIGH (ref 3–12)
Neutrophils Relative %: 58 % (ref 43–77)

## 2011-05-08 MED ORDER — FILGRASTIM 300 MCG/ML IJ SOLN
300.0000 ug | Freq: Once | INTRAMUSCULAR | Status: AC
  Administered 2011-05-08: 300 ug via SUBCUTANEOUS
  Filled 2011-05-08: qty 1

## 2011-05-08 MED ORDER — OXYCODONE HCL 5 MG PO TABS
10.0000 mg | ORAL_TABLET | ORAL | Status: DC | PRN
  Administered 2011-05-08: 5 mg via ORAL
  Administered 2011-05-09 – 2011-05-10 (×3): 10 mg via ORAL
  Filled 2011-05-08: qty 1
  Filled 2011-05-08 (×5): qty 2

## 2011-05-08 MED ORDER — ACETAMINOPHEN 650 MG RE SUPP
650.0000 mg | Freq: Once | RECTAL | Status: AC
  Administered 2011-05-08: 650 mg via RECTAL
  Filled 2011-05-08: qty 1

## 2011-05-08 MED ORDER — FAT EMULSION 20 % IV EMUL
250.0000 mL | INTRAVENOUS | Status: AC
  Administered 2011-05-08: 250 mL via INTRAVENOUS
  Filled 2011-05-08: qty 250

## 2011-05-08 MED ORDER — HYDROMORPHONE HCL PF 1 MG/ML IJ SOLN
1.0000 mg | INTRAMUSCULAR | Status: DC | PRN
Start: 2011-05-08 — End: 2011-05-11
  Filled 2011-05-08 (×2): qty 1

## 2011-05-08 MED ORDER — BISACODYL 10 MG RE SUPP
10.0000 mg | Freq: Two times a day (BID) | RECTAL | Status: DC | PRN
  Administered 2011-05-08 – 2011-05-10 (×3): 10 mg via RECTAL
  Filled 2011-05-08 (×4): qty 1

## 2011-05-08 MED ORDER — TRACE MINERALS CR-CU-MN-SE-ZN 10-1000-500-60 MCG/ML IV SOLN
INTRAVENOUS | Status: AC
  Administered 2011-05-08: 17:00:00 via INTRAVENOUS
  Filled 2011-05-08: qty 2000

## 2011-05-08 NOTE — Progress Notes (Signed)
Patient still with complaint of headache related to NG tube in left nare.  Too early for oral Oxycodone.  Dr. Darnelle Catalan notified and order received.  [name] [facility]  05/08/2011  6:56 PM

## 2011-05-08 NOTE — Progress Notes (Signed)
Patient ID: DESTINI CAMBRE, female   DOB: September 20, 1970, 41 y.o.   MRN: [id number]    Subjective: 500 ml NGT OP last 24 hours. NGT had to be replaced last pm as stopped draining. It seems to be working well currently. Pt reports small loose BM yesterday  Passing a small amount of flatus  She reports she does not feel as good today and having NGT replaced was painful and ABD getting more distended last night was painful.    Objective: Vital signs in last 24 hours: Temp:  [98.4 F (36.9 C)-101.9 F (38.8 C)] 99.1 F (37.3 C) (04/26 0533) Pulse Rate:  [91-96] 91  (04/26 0533) Resp:  [16-20] 16  (04/26 0533) BP: (121-135)/(83-86) 121/83 mmHg (04/26 0533) SpO2:  [95 %-98 %] 95 % (04/26 0533) Last BM Date: 05/07/11  Intake/Output from previous day: 04/25 0701 - 04/26 0700 In: 1846 [P.O.:240; TPN:1606] Out: 500 [Emesis/NG output:500] Intake/Output this shift:   500 ml Dk bilious NGT OP,fever curve higher, pancytopenia persists  General appearance: alert, cooperative and mild distress GI: distended,seems tighter than yesterday,very few bowel sounds,non tender,  NG working well.   Lab Results:   Basename 05/08/11 0415 05/07/11 0500  WBC 2.2* 1.3*  HGB 8.7* 8.3*  HCT 28.3* 27.0*  PLT 115* 116*    BMET  Basename 05/08/11 0415 05/07/11 0500  NA 138 139  K 4.0 4.3  CL 102 104  CO2 28 27  GLUCOSE 105* 94  BUN 9 7  CREATININE 1.18* 1.03  CALCIUM 9.1 9.1   PT/INR No results found for this basename: LABPROT:2,INR:2 in the last 72 hours   Lab 05/08/11 0415 05/07/11 0500  AST 35 25  ALT 34 20  ALKPHOS 106 96  BILITOT 0.6 0.7  PROT 6.9 6.7  ALBUMIN 2.9* 2.8*     Lipase     Component Value Date/Time   LIPASE 18 04/29/2011 0326     Studies/Results: Dg Abd 1 View  05/06/2011  *RADIOLOGY REPORT*  Clinical Data: Follow-up small bowel obstruction  ABDOMEN - 1 VIEW  Comparison: 05/04/2011  Findings: Nasogastric tube has its tip in the midportion of the stomach.  There is  persistent and worsening small bowel dilatation consistent with worsening partial small bowel obstruction.  Some air and contrast are present in the colon.  IMPRESSION: Worsening dilatation of the small intestine consistent with worsening partial small bowel obstruction.  Original Report Authenticated By: Thomasenia Sales, M.D.    Medications:    . acetaminophen  650 mg Rectal Once  . cloNIDine  0.2 mg Transdermal Weekly  . enoxaparin  40 mg Subcutaneous Q24H  . filgrastim  300 mcg Subcutaneous q1800  .  HYDROmorphone (DILAUDID) injection  1 mg Intramuscular Once  . insulin aspart  0-9 Units Subcutaneous Q4H  . lactulose  30 g Oral TID    Assessment/Plan Partial SBO -  Endometrial carcinoma, status post cisplatin/Adriamycin 04/27/2011 with Neupogen support, okay to discontinue Neupogen if the white count remains elevated today  Hypertension Pancytopenia   Plan:  Conitnue NGT on suction, continue bowel rest, and  Hydration. On TNA for nutritional support   DR. Livesay from Oncology following also. Will continue to follow with you.        LOS: 9 days    Joncarlos Atkison,PA-C Pager [phone] General Trauma Pager [phone]

## 2011-05-08 NOTE — Progress Notes (Signed)
PARENTERAL NUTRITION CONSULT NOTE - Follow-Up  Pharmacy Consult for TNA Indication: pSBO  No Known Allergies  Patient Measurements: Height: 5\' 6"  (167.6 cm) Weight: 205 lb (92.987 kg) IBW/kg (Calculated) : 59.3  Adjusted Body Weight: 69 kg  Vital Signs: Temp: 99.1 F (37.3 C) (04/26 0533) Temp src: Oral (04/26 0533) BP: 121/83 mmHg (04/26 0533) Pulse Rate: 91  (04/26 0533) Intake/Output from previous day: 04/25 0701 - 04/26 0700 In: 1846 [P.O.:240; TPN:1606] Out: 500 [Emesis/NG output:500] Intake/Output from this shift:    Labs:  Basename 05/08/11 0415 05/07/11 0500 05/06/11 0500  WBC 2.2* 1.3* 1.2*  HGB 8.7* 8.3* 8.4*  HCT 28.3* 27.0* 27.2*  PLT 115* 116* 132*  APTT -- -- --  INR -- -- --     Basename 05/08/11 0415 05/07/11 0500 05/06/11 0500  NA 138 139 139  K 4.0 4.3 4.1  CL 102 104 105  CO2 28 27 26   GLUCOSE 105* 94 100*  BUN 9 7 7   CREATININE 1.18* 1.03 0.97  LABCREA -- -- --  CREAT24HRUR -- -- --  CALCIUM 9.1 9.1 9.1  MG 1.8 1.7 --  PHOS 4.4 3.5 --  PROT 6.9 6.7 --  ALBUMIN 2.9* 2.8* --  AST 35 25 --  ALT 34 20 --  ALKPHOS 106 96 --  BILITOT 0.6 0.7 --  BILIDIR -- -- --  IBILI -- -- --  PREALBUMIN -- -- --  TRIG -- -- --  CHOLHDL -- -- --  CHOL -- -- --   Estimated Creatinine Clearance: 70.6 ml/min (by C-G formula based on Cr of 1.18).    Basename 05/08/11 0745 05/08/11 0518 05/08/11 0015  GLUCAP 105* 112* 99    Medical History: Past Medical History  Diagnosis Date  . Hypertension     SINCE FIRST PREGNANCY  . Asthma   . Constipation   . History of miscarriage   . Preeclampsia   . Postoperative ileus 07/2010  . Iron deficiency anemia     s/p transfusions [facility]  . Uterine cancer     Medications:  Scheduled:     . acetaminophen  650 mg Rectal Once  . cloNIDine  0.2 mg Transdermal Weekly  . enoxaparin  40 mg Subcutaneous Q24H  . filgrastim  300 mcg Subcutaneous q1800  .  HYDROmorphone (DILAUDID) injection  1 mg Intramuscular  Once  . insulin aspart  0-9 Units Subcutaneous Q4H  . lactulose  30 g Oral TID   Infusions:     . dextrose 5 % and 0.9 % NaCl with KCl 40 mEq/L 100 mL/hr at 05/07/11 0657  . dextrose 5 % and 0.9 % NaCl with KCl 40 mEq/L    . TPN (CLINIMIX) +/- additives 40 mL/hr at 05/07/11 1701   And  . fat emulsion 240 mL (05/07/11 1701)    Insulin Requirements in the past 24 hours:  none  Current Nutrition:  CL diet  Clinimix E 5/15 at 40 mL/hr Fat Emulsion 20% at 10 mL/hr Maintenance IVF held due to lack of peripheral IV access  Nutritional Goals:  Per RD: Kcal goals: 1750-2050/day, Protein 70-90g/day, Fluid 1.7-2L/day  Clinimix E 5/15: goal rate 80 ml/hr (1948ml/day) kCal goals including lipid administration = 1843 kcal/day Protein goals = 96 g protein/day    Assessment:  41 yo female with past medical history of endometrial cancer admitted on 4/17 with abdominal cramping and a partial SBO. No current plans for surgical intervention. TNA was started 4/25 via Port-a-cath.  Electrolytes wnl  LFTs wnl  TGs - pending  Prealbumin - pending  Tolerating TNA at initial rate  Plan:  1. Increase Clinimix E 5/15 tonight at 6pm to 60 ml/hr.  2. Continue Lipids daily at 10 ml/hr 3. Due to national shortages, adding MVI on MWF only 4. Follow up on TG and baseline prealbumin when results available. 5. BMet, Mg, Phos tomorrow 6. Await word on IV access for other IV medications / fluids  Elie Goody, PharmD, BCPS Pager: [phone] 05/08/2011  8:05 AM

## 2011-05-08 NOTE — Progress Notes (Signed)
Response to request for spiritual care consult. - Pt was asleep.  I introduced myself and chaplain services to family member at bedside.  I asked if a chaplain could stop by later when pt was awake.  Family member consented.  Follow-up is planned.  Please page chaplain if assistance is needed. Malaak Stach  [phone]

## 2011-05-08 NOTE — Progress Notes (Signed)
Patient had medium sized loose BM after receiving Dulcolax suppository.  [name] [facility]  05/08/2011  6:57 PM

## 2011-05-08 NOTE — Progress Notes (Signed)
  05/08/2011, 9:05 AM  Hospital day: 10 Antibiotics: none Chemotherapy: day 12 cycle 1 cisplatin/adriamycin TNA begun 05-07-11 pm  Patient seen with visitor and RN at bedside.  Subjective: Pain left nares/left sinus area since NG replaced last pm, due to progressive abdominal distension and no drainage from previous NG. Abdomen less distended now and she has had BM this morning. Did not tolerate lactulose po, but did ok with this per NG, given x2 yesterday. TNA begun last pm. No abdominal pain. Denies shortness of breath.  Objective: Vital signs in last 24 hours: Blood pressure 121/83, pulse 91, temperature 99.1 F (37.3 C), temperature source Oral, resp. rate 16, height 5\' 6"  (1.676 m), weight 205 lb (92.987 kg), last menstrual period 07/02/2010, SpO2 95.00%.. Tmax 101.9 IV D5 NS + 40 KCL at 50 Intake/Output from previous day: 04/25 0701 - 04/26 0700 In: 1846 [P.O.:240; TPN:1606] Out: 500 [Emesis/NG output:500] Intake/Output this shift:    Physical exam: awake and alert, looks uncomfortable from NG. No apparent bleeding there, pink tinged fluid in tubing. No other bleeding. Mouth clear and moist. PAC site fine, infusing IVF and TNA. Cor RRR. Lungs clear ant/lat. Abdomen somewhat distended, quiet, not tender. LE no edema, cords. Moves easily in bed.  Lab Results:  Basename 05/08/11 0415 05/07/11 0500  WBC 2.2* 1.3*  HGB 8.7* 8.3*  HCT 28.3* 27.0*  PLT 115* 116*  ANC up to 1.3 today; will give neupogen today then DC BMET  Basename 05/08/11 0415 05/07/11 0500  NA 138 139  K 4.0 4.3  CL 102 104  CO2 28 27  GLUCOSE 105* 94  BUN 9 7  CREATININE 1.18* 1.03  CALCIUM 9.1 9.1    Studies/Results: No results found.   Assessment/Plan: 1. Endometrial carcinoma: recurrent within ~ 2 months of primary chemotherapy and RT. Counts improving and no longer neutropenic from first cycle of cisplatin/adria with neupogen plan as above. Cisplatin/adria to be repeated q 21 days if otherwise  stable. 2.bowel obstruction: partial now. Present NG very uncomfortable. 3.severe chronic constipation: would change back to miralax from lactulose when seems able to tolerate that volume of po fluid; will give dulcolax suppository also now as no longer neutropenic 4.PAC in 5.No nutritiion since admission, now on TNA, which may benefit short term only. 6.difficult social situation: not [place] citizen, no insurance, mother trying to come from [place], 3 very small daughters.  My partners will follow with you over weekend. Not stable for DC.  Murel Shenberger P

## 2011-05-08 NOTE — Progress Notes (Signed)
Still having small bowel movements.  Malignant partial SBO - continue conservative management;  We will follow.  Wilmon Arms. Corliss Skains, MD, [facility] Surgery  05/08/2011 2:39 PM

## 2011-05-08 NOTE — Progress Notes (Signed)
PROGRESS NOTE  Krystal [name] NWG:[id number] DOB: [birth year] DOA: 04/29/2011 PCP: [name], MD, MD  Brief narrative: [name] is a 41 year old female with a past medical history of endometrial cancer who was admitted on 04/29/2011 with abdominal cramping and a partial small bowel obstruction. She is being followed by surgery with no plans for surgical intervention at present.  Assessment/Plan: Principal Problem:  *Partial small bowel obstruction  Surgical consultation performed by Dr. Lodema Pilot on 05/03/11.  Surgical intervention felt to be only used as a last resort.  Treated conservatively with bowel rest and NG tube decompression of the stomach.  Having intermittent bowel movements and passing flatus at this time. Active Problems:  Endometrial ca  Status post cycle 1 cisplatin/Adriamycin.  Rapidly progressive per Dr. Precious Reel notes. Neutropenia / thrombocytopenia  Related to prior chemotherapy.   Status post treatment with Neupogen, now discontinued.  No evidence of bleeding or infection.  White blood cell count and platelet count currently stable. Chronic constipation  On daily MiraLAX.  Lactulose added 05/06/2011.  Dulcolax suppository ordered for today. Inadequate oral intake  Continue TNA, short-term only.  Hypertension  Started on a clonidine patch for/24/2013 with subsequent good blood pressure control.  Was on Norvasc as an outpatient.  Acute renal failure/mild hydronephrosis  Creatinine 1.2 on admission  Steadily improved with IV fluids. Normocytic anemia  Likely from anemia of chronic disease and the sequela of chemotherapy.  Hemoglobin stable.  Code Status: Full Family Communication: Updated at bedside. Disposition Plan: Home, when stable.  Medical Consultants:  Dr. Jama Flavors, Oncology  Dr. Lodema Pilot, Surgery  Other consultants:  Pharmacy  Antibiotics:  None   Subjective  [name] is more lethargic today.  The NG tube is causing her to have some throat discomfort. No dyspnea. She does have a cough but it appears to be related to the NG tube irritation.   Objective    Interim History: Stable overnight.   Objective: Filed Vitals:   05/07/11 2007 05/08/11 0032 05/08/11 0247 05/08/11 0533  BP:  135/86  121/83  Pulse:  92  91  Temp: 98.6 F (37 C) 101.9 F (38.8 C) 98.4 F (36.9 C) 99.1 F (37.3 C)  TempSrc:  Oral  Oral  Resp:  20  16  Height:      Weight:      SpO2:  96%  95%    Intake/Output Summary (Last 24 hours) at 05/08/11 1123 Last data filed at 05/08/11 0600  Gross per 24 hour  Intake   1846 ml  Output    500 ml  Net   1346 ml    Exam: Gen:  NAD, NG tube in place draining brown bilious fluid. Cardiovascular:  RRR, No M/R/G Respiratory: Lungs CTAB Gastrointestinal: Abdomen very distended with sluggish bowel sounds. Extremities: No C/E/C    Data Reviewed: Basic Metabolic Panel:  Lab 05/08/11 5784 05/07/11 0500 05/06/11 0500 05/05/11 0915 05/04/11 1055  NA 138 139 139 136 137  K 4.0 4.3 -- -- --  CL 102 104 105 103 103  CO2 28 27 26 23 19   GLUCOSE 105* 94 100* 103* 62*  BUN 9 7 7 9 12   CREATININE 1.18* 1.03 0.97 0.94 0.96  CALCIUM 9.1 9.1 9.1 9.0 8.8  MG 1.8 1.7 -- -- 1.7  PHOS 4.4 3.5 -- -- --   GFR Estimated Creatinine Clearance: 70.6 ml/min (by C-G formula based on Cr of 1.18). Liver Function Tests:  Lab 05/08/11 0415 05/07/11 0500  AST  35 25  ALT 34 20  ALKPHOS 106 96  BILITOT 0.6 0.7  PROT 6.9 6.7  ALBUMIN 2.9* 2.8*    CBC:  Lab 05/08/11 0415 05/07/11 0500 05/06/11 0500 05/05/11 1125 05/04/11 1055 05/03/11 2310  WBC 2.2* 1.3* 1.2* 1.0* 1.7* --  NEUTROABS 1.3* 0.5* 0.6* -- 1.3* 2.4  HGB 8.7* 8.3* 8.4* 9.0* 8.4* --  HCT 28.3* 27.0* 27.2* 28.7* 26.7* --  MCV 83.0 82.3 81.7 81.3 82.2 --  PLT 115* 116* 132* 162 176 --   Microbiology Recent Results (from the past 240 hour(s))  URINE CULTURE     Status: Normal   Collection Time    04/29/11 10:17 PM      Component Value Range Status Comment   Specimen Description URINE, CLEAN CATCH   Final    Special Requests none   Final    Culture  Setup Time [id number]   Final    Colony Count NO GROWTH   Final    Culture NO GROWTH   Final    Report Status 04/30/2011 FINAL   Final   URINE CULTURE     Status: Normal   Collection Time   05/03/11 10:27 PM      Component Value Range Status Comment   Specimen Description URINE, RANDOM   Final    Special Requests NONE   Final    Culture  Setup Time [id number]   Final    Colony Count 25,000 COLONIES/ML   Final    Culture     Final    Value: Multiple bacterial morphotypes present, none predominant. Suggest appropriate recollection if clinically indicated.   Report Status 05/05/2011 FINAL   Final   CULTURE, BLOOD (ROUTINE X 2)     Status: Normal (Preliminary result)   Collection Time   05/03/11 11:10 PM      Component Value Range Status Comment   Specimen Description BLOOD RIGHT PAC   Final    Special Requests BOTTLES DRAWN AEROBIC AND ANAEROBIC 5CC   Final    Culture  Setup Time [id number]   Final    Culture     Final    Value:        BLOOD CULTURE RECEIVED NO GROWTH TO DATE CULTURE WILL BE HELD FOR 5 DAYS BEFORE ISSUING A FINAL NEGATIVE REPORT   Report Status PENDING   Incomplete     Procedures and Diagnostic Studies:  Dg Abd 1 View 05/06/2011 IMPRESSION: Worsening dilatation of the small intestine consistent with worsening partial small bowel obstruction.  Original Report Authenticated By: Thomasenia Sales, M.D.    Dg Abd 1 View 05/03/2011 IMPRESSION:  1.  Persistent retained contrast.  Last contrast exam 04/29/2011. 2.  Persistent dilatation of small bowel loops, consistent with at least partial small bowel obstruction.  Original Report Authenticated By: Patterson Hammersmith, M.D.    Dg Abd 1 View 05/01/2011 IMPRESSION: Recurrent small bowel dilatation as demonstrated on recent CT and concerning for partial small-bowel  obstruction, possibly on the basis of adhesions or peritoneal carcinomatosis.  Original Report Authenticated By: Gerrianne Scale, M.D.    Abd 1 View (kub) 04/30/2011  IMPRESSION: Normal stool and bowel gas pattern.  Original Report Authenticated By: Brandon Melnick, M.D.    Ct Abdomen Pelvis W Contrast 04/29/2011 IMPRESSION: Dilated loops of small bowel in the left upper abdomen, compatible with at least early/partial small bowel obstruction, possibly on the basis of adhesions.  Additional suspected focal transition in the left mid abdomen.  Interval development of moderate abdominopelvic ascites with peritoneal nodularity, suggesting peritoneal disease.  Gastrohepatic and retroperitoneal nodal metastases, as described above.  Diminished enhancement of the left kidney relative to the right with mild left hydronephrosis, possibly on the basis of extrinsic compression on the mid/lower ureter by peritoneal disease.  Midline lower pelvic ventral hernia containing a loop of small bowel. Original Report Authenticated By: Charline Bills, M.D.     Dg Chest Port 1 View 05/03/2011  IMPRESSION: Extremely low lung volumes with bilateral lower lobe atelectasis and possible retrocardiac opacity.  PA and lateral chest radiograph at full inspiration are recommended when the patient is clinically able, for better evaluation.  Original Report Authenticated By: Harrel Lemon, M.D.    Dg Abd Acute W/chest 05/04/2011 IMPRESSION: Bowel gas pattern suggests residual partial small bowel obstruction.  Original Report Authenticated By: Reyes Ivan, M.D.    Scheduled Meds:    . acetaminophen  650 mg Rectal Once  . cloNIDine  0.2 mg Transdermal Weekly  . enoxaparin  40 mg Subcutaneous Q24H  . filgrastim  300 mcg Subcutaneous q1800  . filgrastim  300 mcg Subcutaneous Once  .  HYDROmorphone (DILAUDID) injection  1 mg Intramuscular Once  . insulin aspart  0-9 Units Subcutaneous Q4H  . lactulose  30 g Oral TID    Continuous Infusions:    . dextrose 5 % and 0.9 % NaCl with KCl 40 mEq/L 100 mL/hr at 05/07/11 0657  . dextrose 5 % and 0.9 % NaCl with KCl 40 mEq/L    . TPN (CLINIMIX) +/- additives 40 mL/hr at 05/07/11 1701   And  . fat emulsion 240 mL (05/07/11 1701)  . TPN (CLINIMIX) +/- additives     And  . fat emulsion        LOS: 9 days   Hillery Aldo, MD Pager [phone]  05/08/2011, 11:23 AM  Patient teaching/information given to the patient and reviewed with the patient:  Krystal [name] 05/08/2011  Treatment team:  Dr. Hillery Aldo, Hospitalist (Internist)  Dr. Jama Flavors, Oncologist  Dr. Lodema Pilot (and partners), Surgeon  Medical Issues and plan: Principal Problem:  *Partial blockage of the bowel  Surgical consultation performed by Dr. Lodema Pilot on 05/03/11.  Surgical intervention felt to be only used as a last resort.  Treated conservatively with bowel rest and NG tube decompression of the stomach.  Having bowel movements and passing gas is a positive sign. Active Problems:  Endometrial cancer  Status post chemotherapy.  Further treatment per Dr. Darrold Span. Low white cells and platelets  Related to prior chemotherapy. Low white cells can put you at risk for infection and low platelets can cause problems with bleeding.  Neupogen discontinued because her white blood cell count has recovered.   No evidence of bleeding. Chronic constipation  On daily MiraLAX.  Lactulose added 05/06/2011.  Dulcolax suppository today. Inadequate oral intake  TNA. Started. This is a temporary treatment for malnourishment. High blood pressure  Started on a clonidine patch 05/06/2011 with subsequent good blood pressure control.  Was on Norvasc as an outpatient, but this is on hold since you have an NG tube in. Mild dehydration  Steadily improved with IV fluids. Low red cells  Likely from anemia of chronic disease and the sequela of  chemotherapy.  Hemoglobin stable.  Anticipated discharge date: Depends on progress.

## 2011-05-08 NOTE — Progress Notes (Signed)
Patient had a few notable things occur during the night shift.  Pt. Around 10pm became very distended and c/o abdominal pain.  NG tube appeared to be not flowing and appeared to have fallen out of place.  NG tube was pulled and another tube was reinserted; patient immediately felt relief after this.   Another thing to mention is that patient has TNA running through her portacath.  IV team and day shift RN attempted several times to start an additional peripheral IV and were not successful.  Pt. Will not be able to get fluids or IV antiemetics/pain medications if TNA is running through patient's port.  Patient had an episode of pain last night and IM dilaudid (per on call md order) was given.  Pt. Reported this was helpful.  MD, if you would prefer TNA over fluids, please d/c fluid order since patient has no other IV access.  Next, Pt. Refused chronulac dose last night.  It was due at the time that patient was extremely distended.  Once we reinserted NG tube the patient understandably did not want to clamp the tube since her abdomen was distended and continuous suction was needed.  Last note, Patient spiked a fever of about 101.9.  Pt. Was given tylenol suppository and patient was afebrile 1 hour later. Pt. This morning appears to be feeling much better with no complaints.  Temperature is low grade  Will continue to monitor.  Again please address issue regarding IV Fluids Vs TNA.[name]

## 2011-05-09 LAB — PHOSPHORUS: Phosphorus: 4.6 mg/dL (ref 2.3–4.6)

## 2011-05-09 LAB — CBC
Hemoglobin: 9.1 g/dL — ABNORMAL LOW (ref 12.0–15.0)
MCH: 25.6 pg — ABNORMAL LOW (ref 26.0–34.0)
MCHC: 31.1 g/dL (ref 30.0–36.0)
Platelets: 134 10*3/uL — ABNORMAL LOW (ref 150–400)
RDW: 15.8 % — ABNORMAL HIGH (ref 11.5–15.5)

## 2011-05-09 LAB — BASIC METABOLIC PANEL
CO2: 28 mEq/L (ref 19–32)
Calcium: 9.3 mg/dL (ref 8.4–10.5)
Creatinine, Ser: 1.14 mg/dL — ABNORMAL HIGH (ref 0.50–1.10)
GFR calc Af Amer: 67 mL/min — ABNORMAL LOW (ref >90)
Sodium: 137 mEq/L (ref 135–145)

## 2011-05-09 LAB — GLUCOSE, CAPILLARY
Glucose-Capillary: 107 mg/dL — ABNORMAL HIGH (ref 70–99)
Glucose-Capillary: 116 mg/dL — ABNORMAL HIGH (ref 70–99)

## 2011-05-09 LAB — MAGNESIUM: Magnesium: 1.9 mg/dL (ref 1.5–2.5)

## 2011-05-09 MED ORDER — FAT EMULSION 20 % IV EMUL
250.0000 mL | INTRAVENOUS | Status: AC
  Administered 2011-05-09: 250 mL via INTRAVENOUS
  Filled 2011-05-09: qty 250

## 2011-05-09 MED ORDER — LIDOCAINE HCL 2 % EX GEL
Freq: Once | CUTANEOUS | Status: AC
  Administered 2011-05-09: 11:00:00 via TOPICAL
  Filled 2011-05-09: qty 5

## 2011-05-09 MED ORDER — CLINIMIX E/DEXTROSE (5/15) 5 % IV SOLN
INTRAVENOUS | Status: AC
  Administered 2011-05-09: 18:00:00 via INTRAVENOUS
  Filled 2011-05-09: qty 2000

## 2011-05-09 NOTE — Progress Notes (Signed)
PARENTERAL NUTRITION CONSULT NOTE - Follow-Up  Pharmacy Consult for TNA Indication: pSBO  No Known Allergies  Patient Measurements: Height: 5\' 6"  (167.6 cm) Weight: 205 lb (92.987 kg) IBW/kg (Calculated) : 59.3  Adjusted Body Weight: 69 kg  Vital Signs: Temp: 99.5 F (37.5 C) (04/27 0553) Temp src: Oral (04/27 0553) BP: 103/69 mmHg (04/27 0553) Pulse Rate: 96  (04/27 0553) Intake/Output from previous day: 04/26 0701 - 04/27 0700 In: 1468.4 [NG/GT:30; ZOX:0960.4] Out: 650 [Emesis/NG output:650] Intake/Output from this shift:    Labs:  Basename 05/09/11 0630 05/08/11 0415 05/07/11 0500  WBC 6.0 2.2* 1.3*  HGB 9.1* 8.7* 8.3*  HCT 29.3* 28.3* 27.0*  PLT 134* 115* 116*  APTT -- -- --  INR -- -- --     Basename 05/09/11 0630 05/08/11 0415 05/07/11 0500  NA 137 138 139  K 3.8 4.0 4.3  CL 100 102 104  CO2 28 28 27   GLUCOSE 116* 105* 94  BUN 17 9 7   CREATININE 1.14* 1.18* 1.03  LABCREA -- -- --  CREAT24HRUR -- -- --  CALCIUM 9.3 9.1 9.1  MG 1.9 1.8 1.7  PHOS 4.6 4.4 3.5  PROT -- 6.9 6.7  ALBUMIN -- 2.9* 2.8*  AST -- 35 25  ALT -- 34 20  ALKPHOS -- 106 96  BILITOT -- 0.6 0.7  BILIDIR -- -- --  IBILI -- -- --  PREALBUMIN -- 9.7* --  TRIG -- 126 --  CHOLHDL -- -- --  CHOL -- 179 --   Estimated Creatinine Clearance: 73.1 ml/min (by C-G formula based on Cr of 1.14).    Basename 05/09/11 0011 05/08/11 2001 05/08/11 1604  GLUCAP 116* 100* 106*    Medical History: Past Medical History  Diagnosis Date  . Hypertension     SINCE FIRST PREGNANCY  . Asthma   . Constipation   . History of miscarriage   . Preeclampsia   . Postoperative ileus 07/2010  . Iron deficiency anemia     s/p transfusions [facility]  . Uterine cancer     Medications:  Scheduled:     . cloNIDine  0.2 mg Transdermal Weekly  . enoxaparin  40 mg Subcutaneous Q24H  . filgrastim  300 mcg Subcutaneous Once  . insulin aspart  0-9 Units Subcutaneous Q4H  . lactulose  30 g Oral TID  .  lidocaine   Topical Once   Infusions:     . TPN (CLINIMIX) +/- additives 40 mL/hr at 05/07/11 1701   And  . fat emulsion 240 mL (05/07/11 1701)  . TPN (CLINIMIX) +/- additives 60 mL/hr at 05/08/11 1718   And  . fat emulsion 250 mL (05/08/11 1718)  . DISCONTD: dextrose 5 % and 0.9 % NaCl with KCl 40 mEq/L      Insulin Requirements in the past 24 hours:  none  Current Nutrition:  CL diet  Clinimix E 5/15 at 60 mL/hr Fat Emulsion 20% at 10 mL/hr Maintenance IVF held due to lack of peripheral IV access  Nutritional Goals:  Per RD: Kcal goals: 1750-2050/day, Protein 70-90g/day, Fluid 1.7-2L/day  Clinimix E 5/15: goal rate 80 ml/hr (1951ml/day) kCal goals including lipid administration = 1843 kcal/day Protein goals = 96 g protein/day  Assessment:  41 yo female with past medical history of endometrial cancer admitted on 4/17 with abdominal cramping and a partial SBO. No current plans for surgical intervention, being managed conservatively with bowel rest and NG tube decompression. TNA was started 4/25 via Port-a-cath.  Electrolytes wnl  CBG < 150  LFTs wnl 4/26  Lipids wnl 4/26  Prealbumin - 9.7 4/26  Tolerating TNA at advanced rate of 43ml/hr  Plan:  Increase Clinimix E 5/15 tonight at 6pm to 70 ml/hr.  Continue Lipids daily at 10 ml/hr Due to national shortages, adding MVI on MWF only BMET tomorrow  Gwen Her PharmD  [phone] 05/09/2011 8:12 AM

## 2011-05-09 NOTE — Progress Notes (Signed)
Patient ID: Krystal [name], female   DOB: 1970/10/03, 41 y.o.   MRN: [id number] [facility] Surgery Progress Note:   * No surgery found *  Subjective: Mental status is clear  Objective: Vital signs in last 24 hours: Temp:  [98.3 F (36.8 C)-101 F (38.3 C)] 99.5 F (37.5 C) (04/27 0553) Pulse Rate:  [92-101] 96  (04/27 0553) Resp:  [16] 16  (04/27 0553) BP: (103-131)/(69-89) 103/69 mmHg (04/27 0553) SpO2:  [92 %-97 %] 96 % (04/27 0553)  Intake/Output from previous day: 04/26 0701 - 04/27 0700 In: 1468.4 [NG/GT:30; WUJ:8119.1] Out: 650 [Emesis/NG output:650] Intake/Output this shift:    Physical Exam: Work of breathing is  Appears normal.  NG out.  Passed flatus and small bm.    Lab Results:  Results for orders placed during the hospital encounter of 04/29/11 (from the past 48 hour(s))  GLUCOSE, CAPILLARY     Status: Normal   Collection Time   05/07/11  8:02 PM      Component Value Range Comment   Glucose-Capillary 90  70 - 99 (mg/dL)    Comment 1 Notify RN     GLUCOSE, CAPILLARY     Status: Normal   Collection Time   05/08/11 12:15 AM      Component Value Range Comment   Glucose-Capillary 99  70 - 99 (mg/dL)    Comment 1 Notify RN     CBC     Status: Abnormal   Collection Time   05/08/11  4:15 AM      Component Value Range Comment   WBC 2.2 (*) 4.0 - 10.5 (K/uL)    RBC 3.41 (*) 3.87 - 5.11 (MIL/uL)    Hemoglobin 8.7 (*) 12.0 - 15.0 (g/dL)    HCT 47.8 (*) 29.5 - 46.0 (%)    MCV 83.0  78.0 - 100.0 (fL)    MCH 25.5 (*) 26.0 - 34.0 (pg)    MCHC 30.7  30.0 - 36.0 (g/dL)    RDW 62.1  30.8 - 65.7 (%)    Platelets 115 (*) 150 - 400 (K/uL)   DIFFERENTIAL     Status: Abnormal   Collection Time   05/08/11  4:15 AM      Component Value Range Comment   Neutrophils Relative 58  43 - 77 (%)    Lymphocytes Relative 27  12 - 46 (%)    Monocytes Relative 13 (*) 3 - 12 (%)    Eosinophils Relative 1  0 - 5 (%)    Basophils Relative 1  0 - 1 (%)    Neutro Abs 1.3 (*) 1.7 - 7.7  (K/uL)    Lymphs Abs 0.6 (*) 0.7 - 4.0 (K/uL)    Monocytes Absolute 0.3  0.1 - 1.0 (K/uL)    Eosinophils Absolute 0.0  0.0 - 0.7 (K/uL)    Basophils Absolute 0.0  0.0 - 0.1 (K/uL)    WBC Morphology MILD LEFT SHIFT (1-5% METAS, OCC MYELO, OCC BANDS)   ATYPICAL LYMPHOCYTES  COMPREHENSIVE METABOLIC PANEL     Status: Abnormal   Collection Time   05/08/11  4:15 AM      Component Value Range Comment   Sodium 138  135 - 145 (mEq/L)    Potassium 4.0  3.5 - 5.1 (mEq/L)    Chloride 102  96 - 112 (mEq/L)    CO2 28  19 - 32 (mEq/L)    Glucose, Bld 105 (*) 70 - 99 (mg/dL)    BUN 9  6 - 23 (mg/dL)    Creatinine, Ser 9.60 (*) 0.50 - 1.10 (mg/dL)    Calcium 9.1  8.4 - 10.5 (mg/dL)    Total Protein 6.9  6.0 - 8.3 (g/dL)    Albumin 2.9 (*) 3.5 - 5.2 (g/dL)    AST 35  0 - 37 (U/L)    ALT 34  0 - 35 (U/L)    Alkaline Phosphatase 106  39 - 117 (U/L)    Total Bilirubin 0.6  0.3 - 1.2 (mg/dL)    GFR calc non Af Amer 56 (*) >90 (mL/min)    GFR calc Af Amer 64 (*) >90 (mL/min)   PREALBUMIN     Status: Abnormal   Collection Time   05/08/11  4:15 AM      Component Value Range Comment   Prealbumin 9.7 (*) 17.0 - 34.0 (mg/dL)   MAGNESIUM     Status: Normal   Collection Time   05/08/11  4:15 AM      Component Value Range Comment   Magnesium 1.8  1.5 - 2.5 (mg/dL)   PHOSPHORUS     Status: Normal   Collection Time   05/08/11  4:15 AM      Component Value Range Comment   Phosphorus 4.4  2.3 - 4.6 (mg/dL)   CHOLESTEROL, TOTAL     Status: Normal   Collection Time   05/08/11  4:15 AM      Component Value Range Comment   Cholesterol 179  0 - 200 (mg/dL)   TRIGLYCERIDES     Status: Normal   Collection Time   05/08/11  4:15 AM      Component Value Range Comment   Triglycerides 126  <150 (mg/dL)   GLUCOSE, CAPILLARY     Status: Abnormal   Collection Time   05/08/11  5:18 AM      Component Value Range Comment   Glucose-Capillary 112 (*) 70 - 99 (mg/dL)    Comment 1 Notify RN     GLUCOSE, CAPILLARY      Status: Abnormal   Collection Time   05/08/11  7:45 AM      Component Value Range Comment   Glucose-Capillary 105 (*) 70 - 99 (mg/dL)    Comment 1 Documented in Chart      Comment 2 Notify RN     GLUCOSE, CAPILLARY     Status: Abnormal   Collection Time   05/08/11 11:50 AM      Component Value Range Comment   Glucose-Capillary 101 (*) 70 - 99 (mg/dL)    Comment 1 Documented in Chart      Comment 2 Notify RN     GLUCOSE, CAPILLARY     Status: Abnormal   Collection Time   05/08/11  4:04 PM      Component Value Range Comment   Glucose-Capillary 106 (*) 70 - 99 (mg/dL)    Comment 1 Documented in Chart      Comment 2 Notify RN     GLUCOSE, CAPILLARY     Status: Abnormal   Collection Time   05/08/11  8:01 PM      Component Value Range Comment   Glucose-Capillary 100 (*) 70 - 99 (mg/dL)   GLUCOSE, CAPILLARY     Status: Abnormal   Collection Time   05/09/11 12:11 AM      Component Value Range Comment   Glucose-Capillary 116 (*) 70 - 99 (mg/dL)    Comment 1 Notify RN     BASIC  METABOLIC PANEL     Status: Abnormal   Collection Time   05/09/11  6:30 AM      Component Value Range Comment   Sodium 137  135 - 145 (mEq/L)    Potassium 3.8  3.5 - 5.1 (mEq/L)    Chloride 100  96 - 112 (mEq/L)    CO2 28  19 - 32 (mEq/L)    Glucose, Bld 116 (*) 70 - 99 (mg/dL)    BUN 17  6 - 23 (mg/dL)    Creatinine, Ser 6.04 (*) 0.50 - 1.10 (mg/dL)    Calcium 9.3  8.4 - 10.5 (mg/dL)    GFR calc non Af Amer 58 (*) >90 (mL/min)    GFR calc Af Amer 67 (*) >90 (mL/min)   MAGNESIUM     Status: Normal   Collection Time   05/09/11  6:30 AM      Component Value Range Comment   Magnesium 1.9  1.5 - 2.5 (mg/dL)   PHOSPHORUS     Status: Normal   Collection Time   05/09/11  6:30 AM      Component Value Range Comment   Phosphorus 4.6  2.3 - 4.6 (mg/dL)   CBC     Status: Abnormal   Collection Time   05/09/11  6:30 AM      Component Value Range Comment   WBC 6.0  4.0 - 10.5 (K/uL)    RBC 3.56 (*) 3.87 - 5.11  (MIL/uL)    Hemoglobin 9.1 (*) 12.0 - 15.0 (g/dL)    HCT 54.0 (*) 98.1 - 46.0 (%)    MCV 82.3  78.0 - 100.0 (fL)    MCH 25.6 (*) 26.0 - 34.0 (pg)    MCHC 31.1  30.0 - 36.0 (g/dL)    RDW 19.1 (*) 47.8 - 15.5 (%)    Platelets 134 (*) 150 - 400 (K/uL)     Radiology/Results: No results found.  Anti-infectives: Anti-infectives    None      Assessment/Plan: Problem List: Patient Active Problem List  Diagnoses  . Endometrial ca  . History of miscarriage  . Preeclampsia  . Malignant neoplasm of body of uterus  . Endometrial cancer  . Incisional hernia  . Partial small bowel obstruction  . Leukocytosis  . Neutropenia  . Chronic constipation    Complicated oncologic problems.  Ileus or partial sbo may be resolving.  If this patient required surgery, I would think that gyn onc would need to be involved.   * No surgery found *    LOS: 10 days   Matt B. Daphine Deutscher, MD, [facility] [phone] beeper [phone]  05/09/2011 7:58 AM

## 2011-05-09 NOTE — Progress Notes (Addendum)
PROGRESS NOTE  KASSONDRA GEIL ZOX:[id number] DOB: 07/16/1970 DOA: 04/29/2011 PCP: [name], MD, MD  Brief narrative: [name] is a 41 year old female with a past medical history of endometrial cancer who was admitted on 04/29/2011 with abdominal cramping and a partial small bowel obstruction. She is being followed by surgery with no plans for surgical intervention at present.  Assessment/Plan: Principal Problem:  *Partial small bowel obstruction  Surgical consultation performed by Dr. Lodema Pilot on 05/03/11.  Surgical intervention felt to be only used as a last resort.  Treated conservatively with bowel rest and NG tube decompression of the stomach.  Having intermittent bowel movements and passing flatus at this time.  NG tube out as of 05/09/2011. Refuses to have replaced at this time.  Attempt CL diet today. Active Problems:  Endometrial ca  Status post cycle 1 cisplatin/Adriamycin.  Rapidly progressive per Dr. Precious Reel notes. Neutropenia / thrombocytopenia  Related to prior chemotherapy.   Status post treatment with Neupogen, now discontinued.  No evidence of bleeding or infection.  White blood cell count now normal and platelet count currently stable. Chronic constipation  On daily MiraLAX.  Lactulose added 05/06/2011.  Dulcolax suppository ordered for today.  Continues to report that she is passing flatus and that her bowels moved this morning. Inadequate oral intake  Continue TNA, short-term only.  Hypertension  Started on a clonidine patch 05/06/2011 with subsequent good blood pressure control.  Was on Norvasc as an outpatient.  Acute renal failure/mild hydronephrosis  Creatinine 1.2 on admission  Steadily improved with IV fluids. Normocytic anemia  Likely from anemia of chronic disease and the sequela of chemotherapy.  Hemoglobin stable.  Code Status: Full Family Communication: Updated at bedside. Disposition Plan: Home, when  stable.  Medical Consultants:  Dr. Jama Flavors, Oncology  Dr. Lodema Pilot, Surgery  Other consultants:  Pharmacy  Antibiotics:  None   Subjective  Ms. Lavergne states that her bowels moved this morning and that she is having flatus. Denies current nausea or vomiting. Still very weak.   Objective    Interim History: Stable overnight.   Objective: Filed Vitals:   05/08/11 0533 05/08/11 1503 05/08/11 2158 05/09/11 0553  BP: 121/83 131/89 128/83 103/69  Pulse: 91 92 101 96  Temp: 99.1 F (37.3 C) 98.3 F (36.8 C) 101 F (38.3 C) 99.5 F (37.5 C)  TempSrc: Oral Oral Oral Oral  Resp: 16 16 16 16   Height:      Weight:      SpO2: 95% 97% 92% 96%    Intake/Output Summary (Last 24 hours) at 05/09/11 1224 Last data filed at 05/09/11 0541  Gross per 24 hour  Intake 1438.4 ml  Output    650 ml  Net  788.4 ml    Exam: Gen:  NAD, NG tube out. Cardiovascular:  RRR, No M/R/G Respiratory: Lungs CTAB Gastrointestinal: Abdomen very distended with sluggish bowel sounds. Extremities: No C/E/C    Data Reviewed: Basic Metabolic Panel:  Lab 05/09/11 8119 05/08/11 0415 05/07/11 0500 05/06/11 0500 05/05/11 0915 05/04/11 1055  NA 137 138 139 139 136 --  K 3.8 4.0 -- -- -- --  CL 100 102 104 105 103 --  CO2 28 28 27 26 23  --  GLUCOSE 116* 105* 94 100* 103* --  BUN 17 9 7 7 9  --  CREATININE 1.14* 1.18* 1.03 0.97 0.94 --  CALCIUM 9.3 9.1 9.1 9.1 9.0 --  MG 1.9 1.8 1.7 -- -- 1.7  PHOS 4.6 4.4 3.5 -- -- --  GFR Estimated Creatinine Clearance: 73.1 ml/min (by C-G formula based on Cr of 1.14). Liver Function Tests:  Lab 05/08/11 0415 05/07/11 0500  AST 35 25  ALT 34 20  ALKPHOS 106 96  BILITOT 0.6 0.7  PROT 6.9 6.7  ALBUMIN 2.9* 2.8*    CBC:  Lab 05/09/11 0630 05/08/11 0415 05/07/11 0500 05/06/11 0500 05/05/11 1125 05/04/11 1055 05/03/11 2310  WBC 6.0 2.2* 1.3* 1.2* 1.0* -- --  NEUTROABS -- 1.3* 0.5* 0.6* -- 1.3* 2.4  HGB 9.1* 8.7* 8.3* 8.4* 9.0* -- --   HCT 29.3* 28.3* 27.0* 27.2* 28.7* -- --  MCV 82.3 83.0 82.3 81.7 81.3 -- --  PLT 134* 115* 116* 132* 162 -- --   Microbiology Recent Results (from the past 240 hour(s))  URINE CULTURE     Status: Normal   Collection Time   04/29/11 10:17 PM      Component Value Range Status Comment   Specimen Description URINE, CLEAN CATCH   Final    Special Requests none   Final    Culture  Setup Time [id number]   Final    Colony Count NO GROWTH   Final    Culture NO GROWTH   Final    Report Status 04/30/2011 FINAL   Final   URINE CULTURE     Status: Normal   Collection Time   05/03/11 10:27 PM      Component Value Range Status Comment   Specimen Description URINE, RANDOM   Final    Special Requests NONE   Final    Culture  Setup Time [id number]   Final    Colony Count 25,000 COLONIES/ML   Final    Culture     Final    Value: Multiple bacterial morphotypes present, none predominant. Suggest appropriate recollection if clinically indicated.   Report Status 05/05/2011 FINAL   Final   CULTURE, BLOOD (ROUTINE X 2)     Status: Normal (Preliminary result)   Collection Time   05/03/11 11:10 PM      Component Value Range Status Comment   Specimen Description BLOOD RIGHT PAC   Final    Special Requests BOTTLES DRAWN AEROBIC AND ANAEROBIC 5CC   Final    Culture  Setup Time [id number]   Final    Culture     Final    Value:        BLOOD CULTURE RECEIVED NO GROWTH TO DATE CULTURE WILL BE HELD FOR 5 DAYS BEFORE ISSUING A FINAL NEGATIVE REPORT   Report Status PENDING   Incomplete     Procedures and Diagnostic Studies:  Dg Abd 1 View 05/06/2011 IMPRESSION: Worsening dilatation of the small intestine consistent with worsening partial small bowel obstruction.  Original Report Authenticated By: Thomasenia Sales, M.D.    Dg Abd 1 View 05/03/2011 IMPRESSION:  1.  Persistent retained contrast.  Last contrast exam 04/29/2011. 2.  Persistent dilatation of small bowel loops, consistent with at least partial  small bowel obstruction.  Original Report Authenticated By: Patterson Hammersmith, M.D.    Dg Abd 1 View 05/01/2011 IMPRESSION: Recurrent small bowel dilatation as demonstrated on recent CT and concerning for partial small-bowel obstruction, possibly on the basis of adhesions or peritoneal carcinomatosis.  Original Report Authenticated By: Gerrianne Scale, M.D.    Abd 1 View (kub) 04/30/2011  IMPRESSION: Normal stool and bowel gas pattern.  Original Report Authenticated By: Brandon Melnick, M.D.    Ct Abdomen Pelvis W Contrast 04/29/2011 IMPRESSION: Dilated loops  of small bowel in the left upper abdomen, compatible with at least early/partial small bowel obstruction, possibly on the basis of adhesions.  Additional suspected focal transition in the left mid abdomen.  Interval development of moderate abdominopelvic ascites with peritoneal nodularity, suggesting peritoneal disease.  Gastrohepatic and retroperitoneal nodal metastases, as described above.  Diminished enhancement of the left kidney relative to the right with mild left hydronephrosis, possibly on the basis of extrinsic compression on the mid/lower ureter by peritoneal disease.  Midline lower pelvic ventral hernia containing a loop of small bowel. Original Report Authenticated By: Charline Bills, M.D.     Dg Chest Port 1 View 05/03/2011  IMPRESSION: Extremely low lung volumes with bilateral lower lobe atelectasis and possible retrocardiac opacity.  PA and lateral chest radiograph at full inspiration are recommended when the patient is clinically able, for better evaluation.  Original Report Authenticated By: Harrel Lemon, M.D.    Dg Abd Acute W/chest 05/04/2011 IMPRESSION: Bowel gas pattern suggests residual partial small bowel obstruction.  Original Report Authenticated By: Reyes Ivan, M.D.    Scheduled Meds:    . cloNIDine  0.2 mg Transdermal Weekly  . enoxaparin  40 mg Subcutaneous Q24H  . insulin aspart  0-9 Units  Subcutaneous Q4H  . lactulose  30 g Oral TID  . lidocaine   Topical Once   Continuous Infusions:    . TPN (CLINIMIX) +/- additives 40 mL/hr at 05/07/11 1701   And  . fat emulsion 240 mL (05/07/11 1701)  . TPN (CLINIMIX) +/- additives 60 mL/hr at 05/08/11 1718   And  . fat emulsion 250 mL (05/08/11 1718)  . TPN (CLINIMIX) +/- additives     And  . fat emulsion    . DISCONTD: dextrose 5 % and 0.9 % NaCl with KCl 40 mEq/L        LOS: 10 days   Hillery Aldo, MD Pager [phone]  05/09/2011, 12:24 PM  Patient teaching/information given to the patient and reviewed with the patient:  SIMRA FIEBIG 05/09/2011  Treatment team:  Dr. Hillery Aldo, Hospitalist (Internist)  Dr. Jama Flavors, Oncologist  Dr. Lodema Pilot (and partners), Surgeon  Medical Issues and plan: Principal Problem:  *Partial blockage of the bowel  Surgical consultation performed by Dr. Lodema Pilot on 05/03/11.  Surgical intervention felt to be only used as a last resort.  Treated conservatively with bowel rest and NG tube decompression of the stomach.  Having bowel movements and passing gas is a positive sign.  Can leave the NG tube out as long as you don't have any vomiting.  Triceps of clear liquids. Active Problems:  Endometrial cancer  Status post chemotherapy.  Further treatment per Dr. Darrold Span. Low white cells and platelets  Related to prior chemotherapy. Low white cells can put you at risk for infection and low platelets can cause problems with bleeding.  Neupogen discontinued because your white blood cell count is now normal at 6.   No evidence of bleeding. Chronic constipation  On daily MiraLAX.  Lactulose added 05/06/2011.  Dulcolax suppository 05/08/2011. Inadequate oral intake  TNA. Started. This is a temporary treatment for malnourishment.  We will start to taper this as you are able to take in more nutrition by mouth. High blood pressure  Started on a  clonidine patch 05/06/2011 with subsequent good blood pressure control.  Was on Norvasc as an outpatient, but this is on hold since you have an NG tube in. Mild dehydration  Steadily improved  with IV fluids. Low red cells  Likely from anemia of chronic disease and the sequela of chemotherapy.  Hemoglobin stable.  Anticipated discharge date: Depends on progress.

## 2011-05-09 NOTE — Progress Notes (Signed)
An attempt was made to insert 18 FR NG tube after lidocaine gel applied x 1hour.  Patient still unable to tolerate procedure due to pain and refused anymore attempts.  Dr. Darnelle Catalan notified.  Per Dr. Darnelle Catalan leave NG tube out for now.

## 2011-05-09 NOTE — Progress Notes (Signed)
Patient requested suppository and had small loose BM with lots of flatus.

## 2011-05-09 NOTE — Progress Notes (Signed)
Subjective: Complain of pain due to abdominal distension, no nausea or vomiting, afebrile.  Objective:  Most recent Vital signs: Blood pressure 114/78, pulse 103, temperature 99.3 F (37.4 C), temperature source Oral, resp. rate 18, height 5\' 6"  (1.676 m), weight 205 lb (92.987 kg), last menstrual period 07/02/2010, SpO2 97.00%. 5\' 6"  (1.676 m)    Body surface area is 2.08 meters squared.  Vital signs in last 24 hours: Temp:  [98.4 F (36.9 C)-99.5 F (37.5 C)] 99.3 F (37.4 C) (04/27 1954) Pulse Rate:  [96-117] 103  (04/27 1954) Resp:  [16-18] 18  (04/27 1954) BP: (103-117)/(69-87) 114/78 mmHg (04/27 1954) SpO2:  [95 %-97 %] 97 % (04/27 1954)  Intake/Output from previous day: 04/26 0701 - 04/27 0700 In: 1468.4 [NG/GT:30; WCH:8527.7] Out: 650 [Emesis/NG output:650]  Physical Exam: Awake and alert thin pale female Lungs: clear CVS: RRR Abdomen: distended with mild tenderness EXT: no edema  Lab Results:   Basename 05/09/11 0630 05/08/11 0415  WBC 6.0 2.2*  HGB 9.1* 8.7*  HCT 29.3* 28.3*  PLT 134* 115*   BMET:  Basename 05/09/11 0630 05/08/11 0415  NA 137 138  K 3.8 4.0  CL 100 102  CO2 28 28  GLUCOSE 116* 105*  BUN 17 9  CREATININE 1.14* 1.18*  CALCIUM 9.3 9.1   CMP:     Component Value Date/Time   NA 137 05/09/2011 0630   K 3.8 05/09/2011 0630   CL 100 05/09/2011 0630   CO2 28 05/09/2011 0630   GLUCOSE 116* 05/09/2011 0630   BUN 17 05/09/2011 0630   CREATININE 1.14* 05/09/2011 0630   CREATININE 0.55 10/17/2009 1300   CALCIUM 9.3 05/09/2011 0630   PROT 6.9 05/08/2011 0415   ALBUMIN 2.9* 05/08/2011 0415   AST 35 05/08/2011 0415   ALT 34 05/08/2011 0415   ALKPHOS 106 05/08/2011 0415   BILITOT 0.6 05/08/2011 0415   GFRNONAA 58* 05/09/2011 0630   GFRAA 67* 05/09/2011 0630   Micro: Results for orders placed during the hospital encounter of 04/29/11  URINE CULTURE     Status: Normal   Collection Time   04/29/11 10:17 PM      Component Value Range Status Comment   Specimen Description URINE, CLEAN CATCH   Final    Special Requests none   Final    Culture  Setup Time [id number]   Final    Colony Count NO GROWTH   Final    Culture NO GROWTH   Final    Report Status 04/30/2011 FINAL   Final   URINE CULTURE     Status: Normal   Collection Time   05/03/11 10:27 PM      Component Value Range Status Comment   Specimen Description URINE, RANDOM   Final    Special Requests NONE   Final    Culture  Setup Time [id number]   Final    Colony Count 25,000 COLONIES/ML   Final    Culture     Final    Value: Multiple bacterial morphotypes present, none predominant. Suggest appropriate recollection if clinically indicated.   Report Status 05/05/2011 FINAL   Final   CULTURE, BLOOD (ROUTINE X 2)     Status: Normal (Preliminary result)   Collection Time   05/03/11 11:10 PM      Component Value Range Status Comment   Specimen Description BLOOD RIGHT PAC   Final    Special Requests BOTTLES DRAWN AEROBIC AND ANAEROBIC 5CC   Final    Culture  Setup Time [id number]   Final    Culture     Final    Value:        BLOOD CULTURE RECEIVED NO GROWTH TO DATE CULTURE WILL BE HELD FOR 5 DAYS BEFORE ISSUING A FINAL NEGATIVE REPORT   Report Status PENDING   Incomplete     Medications: I have reviewed the patient's current medications.  Assessment/Plan: 41 y.o. female with:  1. Endometrial cancer s/p cisplatin and adriamycin 2. Bowel Obstructions slowly improving 3. Chronic constipation on miralax 4. Currently on TNA      LOS: 10 days   Drue Second, MD Medical/Oncology [facility] [phone] (beeper) [phone] (Office)  05/09/2011, 11:59 PM

## 2011-05-09 NOTE — Progress Notes (Addendum)
Patient's NGT became clogged last night. Unable to unclog despite multiple flushing. Tune was removed and thick mucus plug noted in openings. A size 18 FR was tried but patient could not tolerate procedure due to pain. Received order for Lidocaine gel to numb nasal cavity. Day shift will attempt to reinsert tube.

## 2011-05-10 ENCOUNTER — Other Ambulatory Visit: Payer: Self-pay | Admitting: Oncology

## 2011-05-10 LAB — BASIC METABOLIC PANEL
BUN: 22 mg/dL (ref 6–23)
CO2: 25 mEq/L (ref 19–32)
Chloride: 98 mEq/L (ref 96–112)
Creatinine, Ser: 1.22 mg/dL — ABNORMAL HIGH (ref 0.50–1.10)

## 2011-05-10 LAB — GLUCOSE, CAPILLARY
Glucose-Capillary: 112 mg/dL — ABNORMAL HIGH (ref 70–99)
Glucose-Capillary: 110 mg/dL — ABNORMAL HIGH (ref 70–99)
Glucose-Capillary: 113 mg/dL — ABNORMAL HIGH (ref 70–99)

## 2011-05-10 LAB — CULTURE, BLOOD (ROUTINE X 2): Culture: NO GROWTH

## 2011-05-10 MED ORDER — FAT EMULSION 20 % IV EMUL
250.0000 mL | INTRAVENOUS | Status: AC
  Administered 2011-05-10 – 2011-05-11 (×2): 250 mL via INTRAVENOUS
  Filled 2011-05-10: qty 250

## 2011-05-10 MED ORDER — CLINIMIX E/DEXTROSE (5/15) 5 % IV SOLN
INTRAVENOUS | Status: DC
  Administered 2011-05-10: 18:00:00 via INTRAVENOUS
  Filled 2011-05-10: qty 2000

## 2011-05-10 NOTE — Progress Notes (Signed)
Patient ID: Krystal [name], female   DOB: 03/18/1970, 41 y.o.   MRN: [id number] Krystal [name] 41 y.o.  Body mass index is 33.09 kg/(m^2).  Patient Active Problem List  Diagnoses  . Endometrial ca  . History of miscarriage  . Preeclampsia  . Malignant neoplasm of body of uterus  . Endometrial cancer  . Incisional hernia  . Partial small bowel obstruction  . Leukocytosis  . Neutropenia  . Chronic constipation    No Known Allergies  Past Surgical History  Procedure Date  . Cesarean section   . Abdominal hysterectomy 07-08-10     TAH-BSO, WITH OMENTECTOMY AND BILAT. PELVIC AND PERIAORTIC LYMPH NODE DISSECTION  . Appendectomy     IN CHILDHOOD   [name], MD, MD 1. Endometrial ca   2. Partial bowel obstruction   3. Ascites   4. Metastasis   5. Endometrial cancer     Passing flatus and feeling better without NG tube.  Ileus/SBO clearing Matt B. Daphine Deutscher, MD, [facility] [phone] beeper [phone]  05/10/2011 8:10 AM

## 2011-05-10 NOTE — Progress Notes (Signed)
PROGRESS NOTE  Krystal [name] ZOX:[id number] DOB: Sep 03, 1970 DOA: 04/29/2011 PCP: [name], MD, MD  Brief narrative: Krystal [name] is a 41 year old female with a past medical history of endometrial cancer who was admitted on 04/29/2011 with abdominal cramping and a partial small bowel obstruction. She is being followed by surgery with no plans for surgical intervention at present.  Assessment/Plan: Principal Problem:  *Partial small bowel obstruction  Surgical consultation performed by Dr. Lodema Pilot on 05/03/11.  Surgical intervention felt to be only used as a last resort.  Treated conservatively with bowel rest and NG tube decompression of the stomach.  Having intermittent bowel movements and passing flatus at this time.  NG tube out as of 05/09/2011. Refuses to have replaced at this time.  Continue clear liquid diet. Active Problems:  Endometrial ca  Status post cycle 1 cisplatin/Adriamycin.  Rapidly progressive per Dr. Precious Reel notes. Neutropenia / thrombocytopenia  Related to prior chemotherapy.   Status post treatment with Neupogen, now discontinued.  No evidence of bleeding or infection.  White blood cell count now normal and platelet count currently stable. Chronic constipation  On daily MiraLAX.  Lactulose added 05/06/2011.  Dulcolax suppository ordered for today.  Continues to report that she is passing flatus and moving her bowels. Inadequate oral intake  Continue TNA, short-term only.  Wean when by mouth intake improved.  Hypertension  Started on a clonidine patch 05/06/2011 with subsequent good blood pressure control.  Was on Norvasc as an outpatient.  Acute renal failure/mild hydronephrosis  Creatinine 1.2 on admission  Slightly worse, likely due to discontinuation of supplemental IV fluids. Normocytic anemia  Likely from anemia of chronic disease and the sequela of chemotherapy.  Hemoglobin stable.  Code Status: Full Family  Communication: Updated at bedside. Disposition Plan: Home, when stable.  Medical Consultants:  Dr. Jama Flavors, Oncology  Dr. Lodema Pilot, Surgery  Other consultants:  Pharmacy  Antibiotics:  None   Subjective  Krystal [name] did not attempt any clear liquids yesterday because she was nauseated and vomited twice. She is going to try some liquids today as she feels better with decreased abdominal distention and reports that her bowels have moved and she is passing flatus.   Objective    Interim History: Stable overnight.   Objective: Filed Vitals:   05/08/11 2158 05/09/11 0553 05/09/11 1500 05/09/11 1954  BP: 128/83 103/69 117/87 114/78  Pulse: 101 96 117 103  Temp: 101 F (38.3 C) 99.5 F (37.5 C) 98.4 F (36.9 C) 99.3 F (37.4 C)  TempSrc: Oral Oral  Oral  Resp: 16 16 16 18   Height:      Weight:      SpO2: 92% 96% 95% 97%    Intake/Output Summary (Last 24 hours) at 05/10/11 1029 Last data filed at 05/10/11 0557  Gross per 24 hour  Intake 1968.55 ml  Output      2 ml  Net 1966.55 ml    Exam: Gen:  NAD Cardiovascular:  RRR, No M/R/G Respiratory: Lungs CTAB Gastrointestinal: Abdomen very distended with sluggish bowel sounds. Extremities: No C/E/C    Data Reviewed: Basic Metabolic Panel:  Lab 05/10/11 8119 05/09/11 0630 05/08/11 0415 05/07/11 0500 05/06/11 0500 05/04/11 1055  NA 135 137 138 139 139 --  K 3.8 3.8 -- -- -- --  CL 98 100 102 104 105 --  CO2 25 28 28 27 26  --  GLUCOSE 115* 116* 105* 94 100* --  BUN 22 17 9 7 7  --  CREATININE  1.22* 1.14* 1.18* 1.03 0.97 --  CALCIUM 9.2 9.3 9.1 9.1 9.1 --  MG -- 1.9 1.8 1.7 -- 1.7  PHOS -- 4.6 4.4 3.5 -- --   GFR Estimated Creatinine Clearance: 68.3 ml/min (by C-G formula based on Cr of 1.22). Liver Function Tests:  Lab 05/08/11 0415 05/07/11 0500  AST 35 25  ALT 34 20  ALKPHOS 106 96  BILITOT 0.6 0.7  PROT 6.9 6.7  ALBUMIN 2.9* 2.8*    CBC:  Lab 05/09/11 0630 05/08/11 0415 05/07/11  0500 05/06/11 0500 05/05/11 1125 05/04/11 1055 05/03/11 2310  WBC 6.0 2.2* 1.3* 1.2* 1.0* -- --  NEUTROABS -- 1.3* 0.5* 0.6* -- 1.3* 2.4  HGB 9.1* 8.7* 8.3* 8.4* 9.0* -- --  HCT 29.3* 28.3* 27.0* 27.2* 28.7* -- --  MCV 82.3 83.0 82.3 81.7 81.3 -- --  PLT 134* 115* 116* 132* 162 -- --   Microbiology Recent Results (from the past 240 hour(s))  URINE CULTURE     Status: Normal   Collection Time   05/03/11 10:27 PM      Component Value Range Status Comment   Specimen Description URINE, RANDOM   Final    Special Requests NONE   Final    Culture  Setup Time [id number]   Final    Colony Count 25,000 COLONIES/ML   Final    Culture     Final    Value: Multiple bacterial morphotypes present, none predominant. Suggest appropriate recollection if clinically indicated.   Report Status 05/05/2011 FINAL   Final   CULTURE, BLOOD (ROUTINE X 2)     Status: Normal   Collection Time   05/03/11 11:10 PM      Component Value Range Status Comment   Specimen Description BLOOD RIGHT [facility]   Final    Special Requests BOTTLES DRAWN AEROBIC AND ANAEROBIC 5CC   Final    Culture  Setup Time [id number]   Final    Culture NO GROWTH 5 DAYS   Final    Report Status 05/10/2011 FINAL   Final     Procedures and Diagnostic Studies:  Dg Abd 1 View 05/06/2011 IMPRESSION: Worsening dilatation of the small intestine consistent with worsening partial small bowel obstruction.  Original Report Authenticated By: Thomasenia Sales, M.D.    Dg Abd 1 View 05/03/2011 IMPRESSION:  1.  Persistent retained contrast.  Last contrast exam 04/29/2011. 2.  Persistent dilatation of small bowel loops, consistent with at least partial small bowel obstruction.  Original Report Authenticated By: Patterson Hammersmith, M.D.    Dg Abd 1 View 05/01/2011 IMPRESSION: Recurrent small bowel dilatation as demonstrated on recent CT and concerning for partial small-bowel obstruction, possibly on the basis of adhesions or peritoneal carcinomatosis.  Original  Report Authenticated By: Gerrianne Scale, M.D.    Abd 1 View (kub) 04/30/2011  IMPRESSION: Normal stool and bowel gas pattern.  Original Report Authenticated By: Brandon Melnick, M.D.    Ct Abdomen Pelvis W Contrast 04/29/2011 IMPRESSION: Dilated loops of small bowel in the left upper abdomen, compatible with at least early/partial small bowel obstruction, possibly on the basis of adhesions.  Additional suspected focal transition in the left mid abdomen.  Interval development of moderate abdominopelvic ascites with peritoneal nodularity, suggesting peritoneal disease.  Gastrohepatic and retroperitoneal nodal metastases, as described above.  Diminished enhancement of the left kidney relative to the right with mild left hydronephrosis, possibly on the basis of extrinsic compression on the mid/lower ureter by peritoneal disease.  Midline  lower pelvic ventral hernia containing a loop of small bowel. Original Report Authenticated By: Charline Bills, M.D.     Dg Chest Port 1 View 05/03/2011  IMPRESSION: Extremely low lung volumes with bilateral lower lobe atelectasis and possible retrocardiac opacity.  PA and lateral chest radiograph at full inspiration are recommended when the patient is clinically able, for better evaluation.  Original Report Authenticated By: Harrel Lemon, M.D.    Dg Abd Acute W/chest 05/04/2011 IMPRESSION: Bowel gas pattern suggests residual partial small bowel obstruction.  Original Report Authenticated By: Reyes Ivan, M.D.    Scheduled Meds:    . cloNIDine  0.2 mg Transdermal Weekly  . enoxaparin  40 mg Subcutaneous Q24H  . insulin aspart  0-9 Units Subcutaneous Q4H  . lactulose  30 g Oral TID  . lidocaine   Topical Once   Continuous Infusions:    . TPN (CLINIMIX) +/- additives 60 mL/hr at 05/08/11 1718   And  . fat emulsion 250 mL (05/08/11 1718)  . TPN (CLINIMIX) +/- additives 70 mL/hr at 05/09/11 1744   And  . fat emulsion 250 mL (05/09/11 1744)  . TPN  (CLINIMIX) +/- additives     And  . fat emulsion        LOS: 11 days   Hillery Aldo, MD Pager [phone]  05/10/2011, 10:29 AM  Patient teaching/information given to the patient and reviewed with the patient:  Krystal [name] 05/10/2011  Treatment team:  Dr. Hillery Aldo, Hospitalist (Internist)  Dr. Jama Flavors, Oncologist  Dr. Lodema Pilot (and partners), Surgeon  Medical Issues and plan: Principal Problem:  *Partial blockage of the bowel  Surgical consultation performed by Dr. Lodema Pilot on 05/03/11.  Surgical intervention felt to be only used as a last resort.  Treated conservatively with bowel rest and NG tube decompression of the stomach.  Having bowel movements and passing gas is a positive sign.  Can leave the NG tube out as long as you don't have any vomiting.  Try sips of clear liquids. Active Problems:  Endometrial cancer  Status post chemotherapy.  Further treatment per Dr. Darrold Span. Low white cells and platelets  Related to prior chemotherapy. Low white cells can put you at risk for infection and low platelets can cause problems with bleeding.  Neupogen discontinued because your white blood cell count is now normal at 6.   No evidence of bleeding. Chronic constipation  On daily MiraLAX.  Lactulose added 05/06/2011.  Dulcolax suppository 05/08/2011. Inadequate oral intake  TNA. Started. This is a temporary treatment for malnourishment.  We will start to taper this as you are able to take in more nutrition by mouth. High blood pressure  Started on a clonidine patch 05/06/2011 with subsequent good blood pressure control.  Was on Norvasc as an outpatient, but this is on hold since you have an NG tube in. Mild dehydration  Steadily improved with IV fluids. Low red cells  Likely from anemia of chronic disease and the sequela of chemotherapy.  Hemoglobin stable.  Anticipated discharge date: Depends on progress.

## 2011-05-10 NOTE — Progress Notes (Signed)
PARENTERAL NUTRITION CONSULT NOTE - Follow-Up  Pharmacy Consult for TNA Indication: pSBO  No Known Allergies  Patient Measurements: Height: 5\' 6"  (167.6 cm) Weight: 205 lb (92.987 kg) IBW/kg (Calculated) : 59.3  Adjusted Body Weight: 69 kg  Vital Signs: Temp: 99.3 F (37.4 C) (04/27 1954) Temp src: Oral (04/27 1954) BP: 114/78 mmHg (04/27 1954) Pulse Rate: 103  (04/27 1954) Intake/Output from previous day: 04/27 0701 - 04/28 0700 In: 1968.6 [P.O.:240; TPN:1728.6] Out: 2 [Urine:2] Intake/Output from this shift:    Labs:  [facility] [address] 05/09/11 0630 05/08/11 0415  WBC 6.0 2.2*  HGB 9.1* 8.7*  HCT 29.3* 28.3*  PLT 134* 115*  APTT -- --  INR -- --     Basename 05/10/11 0406 05/09/11 0630 05/08/11 0415  NA 135 137 138  K 3.8 3.8 4.0  CL 98 100 102  CO2 25 28 28   GLUCOSE 115* 116* 105*  BUN 22 17 9   CREATININE 1.22* 1.14* 1.18*  LABCREA -- -- --  CREAT24HRUR -- -- --  CALCIUM 9.2 9.3 9.1  MG -- 1.9 1.8  PHOS -- 4.6 4.4  PROT -- -- 6.9  ALBUMIN -- -- 2.9*  AST -- -- 35  ALT -- -- 34  ALKPHOS -- -- 106  BILITOT -- -- 0.6  BILIDIR -- -- --  IBILI -- -- --  PREALBUMIN -- -- 9.7*  TRIG -- -- 126  CHOLHDL -- -- --  CHOL -- -- 179   Estimated Creatinine Clearance: 68.3 ml/min (by C-G formula based on Cr of 1.22).    Basename 05/10/11 0427 05/10/11 0021 05/09/11 1952  GLUCAP 110* 113* 112*    Medical History: Past Medical History  Diagnosis Date  . Hypertension     SINCE FIRST PREGNANCY  . Asthma   . Constipation   . History of miscarriage   . Preeclampsia   . Postoperative ileus 07/2010  . Iron deficiency anemia     s/p transfusions [facility]  . Uterine cancer     Medications:  Scheduled:     . cloNIDine  0.2 mg Transdermal Weekly  . enoxaparin  40 mg Subcutaneous Q24H  . insulin aspart  0-9 Units Subcutaneous Q4H  . lactulose  30 g Oral TID  . lidocaine   Topical Once   Infusions:     . TPN (CLINIMIX) +/- additives 60 mL/hr at 05/08/11 1718     And  . fat emulsion 250 mL (05/08/11 1718)  . TPN (CLINIMIX) +/- additives 70 mL/hr at 05/09/11 1744   And  . fat emulsion 250 mL (05/09/11 1744)    Insulin Requirements in the past 24 hours:  none  Current Nutrition:  CL diet  Clinimix E 5/15 at 70 mL/hr Fat Emulsion 20% at 10 mL/hr Maintenance IVF held due to lack of peripheral IV access  Nutritional Goals:  Per RD: Kcal goals: 1750-2050/day, Protein 70-90g/day, Fluid 1.7-2L/day  Clinimix E 5/15: goal rate 80 ml/hr (1945ml/day) kCal goals including lipid administration = 1843 kcal/day Protein goals = 96 g protein/day  Assessment:  41 yo female with past medical history of endometrial cancer admitted on 4/17 with abdominal cramping and a partial SBO. No current plans for surgical intervention, being managed conservatively with bowel rest and NG tube decompression. TNA was started 4/25 via Port-a-cath.  Noted NG out 4/27 and unable to replace  Electrolytes wnl  CBG < 150  LFTs wnl 4/26  Lipids wnl 4/26  Prealbumin - 9.7 4/26  Tolerating TNA at advanced rate  of 70 ml/hr  Plan:  Increase Clinimix E 5/15 tonight at 6pm to goal rate of 80 ml/hr.  Continue Lipids daily at 10 ml/hr Due to national shortages, adding MVI on MWF only Routine TNA labs tomorrow  Gwen Her PharmD  [phone] 05/10/2011 7:28 AM

## 2011-05-10 NOTE — Progress Notes (Signed)
   Subjective: 41 yo woman from [place] with hx of advanced uterine cancer, on active chemotherapy admitted with partial sbo.Passing flatus, no N/V today; emesis x 2 yesterday  Objective: Temp:  [98.4 F (36.9 C)-99.3 F (37.4 C)] 99.3 F (37.4 C) (04/27 1954) Pulse Rate:  [103-117] 103  (04/27 1954) Resp:  [16-18] 18  (04/27 1954) BP: (114-117)/(78-87) 114/78 mmHg (04/27 1954) SpO2:  [95 %-97 %] 97 % (04/27 1954)  General appearance: alert, cooperative and appears stated age Eyes: conjunctivae/corneas clear. PERRL, EOM's intact. Fundi benign. Throat: lips, mucosa, and tongue normal; teeth and gums normal and tongue coated, no thrush Resp: clear to auscultation bilaterally Cardio: regular rate and rhythm, S1, S2 normal, no murmur, click, rub or gallop GI: abdomen distended, BS quiet; no rebound or tenderness Extremities: extremities normal, atraumatic, no cyanosis or edema Skin: Skin color, texture, turgor normal. No rashes or lesions Neurologic: Grossly normal  Labs:  Basename 05/09/11 0630 05/08/11 0415  WBC 6.0 2.2*  HGB 9.1* 8.7*  HCT 29.3* 28.3*  PLT 134* 115*    Basename 05/10/11 0406 05/09/11 0630  NA 135 137  K 3.8 3.8  CL 98 100  CO2 25 28  GLUCOSE 115* 116*  BUN 22 17  CREATININE 1.22* 1.14*  CALCIUM 9.2 9.3    Results for orders placed during the hospital encounter of 04/29/11  URINE CULTURE     Status: Normal   Collection Time   04/29/11 10:17 PM      Component Value Range Status Comment   Specimen Description URINE, CLEAN CATCH   Final    Special Requests none   Final    Culture  Setup Time [id number]   Final    Colony Count NO GROWTH   Final    Culture NO GROWTH   Final    Report Status 04/30/2011 FINAL   Final   URINE CULTURE     Status: Normal   Collection Time   05/03/11 10:27 PM      Component Value Range Status Comment   Specimen Description URINE, RANDOM   Final    Special Requests NONE   Final    Culture  Setup Time [id number]    Final    Colony Count 25,000 COLONIES/ML   Final    Culture     Final    Value: Multiple bacterial morphotypes present, none predominant. Suggest appropriate recollection if clinically indicated.   Report Status 05/05/2011 FINAL   Final   CULTURE, BLOOD (ROUTINE X 2)     Status: Normal (Preliminary result)   Collection Time   05/03/11 11:10 PM      Component Value Range Status Comment   Specimen Description BLOOD RIGHT PAC   Final    Special Requests BOTTLES DRAWN AEROBIC AND ANAEROBIC 5CC   Final    Culture  Setup Time [id number]   Final    Culture     Final    Value:        BLOOD CULTURE RECEIVED NO GROWTH TO DATE CULTURE WILL BE HELD FOR 5 DAYS BEFORE ISSUING A FINAL NEGATIVE REPORT   Report Status PENDING   Incomplete      No results found.:  Pathology: uterine Ca  Principal Problem:  *Partial small bowel obstruction Active Problems:  Endometrial ca  Neutropenia  Chronic constipation :  Disposition:Pt is better today, but still has a distended abdomen with quiet bowel sounds.  Attempted NG yesterday, unsuccessful. Will continue to follow.

## 2011-05-11 ENCOUNTER — Inpatient Hospital Stay (HOSPITAL_COMMUNITY): Payer: Medicaid Other

## 2011-05-11 DIAGNOSIS — D649 Anemia, unspecified: Secondary | ICD-10-CM

## 2011-05-11 LAB — GLUCOSE, CAPILLARY
Glucose-Capillary: 108 mg/dL — ABNORMAL HIGH (ref 70–99)
Glucose-Capillary: 108 mg/dL — ABNORMAL HIGH (ref 70–99)
Glucose-Capillary: 114 mg/dL — ABNORMAL HIGH (ref 70–99)
Glucose-Capillary: 126 mg/dL — ABNORMAL HIGH (ref 70–99)
Glucose-Capillary: 132 mg/dL — ABNORMAL HIGH (ref 70–99)

## 2011-05-11 LAB — CBC
HCT: 31 % — ABNORMAL LOW (ref 36.0–46.0)
Hemoglobin: 9.4 g/dL — ABNORMAL LOW (ref 12.0–15.0)
MCV: 82.9 fL (ref 78.0–100.0)
RBC: 3.74 MIL/uL — ABNORMAL LOW (ref 3.87–5.11)
RDW: 16.3 % — ABNORMAL HIGH (ref 11.5–15.5)
WBC: 5.8 10*3/uL (ref 4.0–10.5)

## 2011-05-11 LAB — COMPREHENSIVE METABOLIC PANEL
AST: 78 U/L — ABNORMAL HIGH (ref 0–37)
Alkaline Phosphatase: 121 U/L — ABNORMAL HIGH (ref 39–117)
CO2: 27 mEq/L (ref 19–32)
Chloride: 96 mEq/L (ref 96–112)
Creatinine, Ser: 1.21 mg/dL — ABNORMAL HIGH (ref 0.50–1.10)
GFR calc non Af Amer: 54 mL/min — ABNORMAL LOW (ref >90)
Potassium: 3.6 mEq/L (ref 3.5–5.1)
Total Bilirubin: 0.6 mg/dL (ref 0.3–1.2)

## 2011-05-11 LAB — PHOSPHORUS: Phosphorus: 4.7 mg/dL — ABNORMAL HIGH (ref 2.3–4.6)

## 2011-05-11 MED ORDER — FAT EMULSION 20 % IV EMUL
250.0000 mL | INTRAVENOUS | Status: AC
  Administered 2011-05-11: 250 mL via INTRAVENOUS
  Filled 2011-05-11: qty 250

## 2011-05-11 MED ORDER — TRACE MINERALS CR-CU-MN-SE-ZN 10-1000-500-60 MCG/ML IV SOLN
INTRAVENOUS | Status: DC
  Filled 2011-05-11: qty 2000

## 2011-05-11 MED ORDER — BISACODYL 10 MG RE SUPP
10.0000 mg | Freq: Three times a day (TID) | RECTAL | Status: DC
  Administered 2011-05-11 – 2011-05-21 (×12): 10 mg via RECTAL
  Filled 2011-05-11 (×19): qty 1

## 2011-05-11 MED ORDER — HYDROMORPHONE HCL PF 1 MG/ML IJ SOLN: 1.0000 mg | INTRAMUSCULAR | Status: DC | PRN

## 2011-05-11 MED ORDER — CEFAZOLIN SODIUM-DEXTROSE 2-3 GM-% IV SOLR
2.0000 g | INTRAVENOUS | Status: AC
  Filled 2011-05-11: qty 50

## 2011-05-11 MED ORDER — HYDROMORPHONE HCL PF 1 MG/ML IJ SOLN
1.0000 mg | INTRAMUSCULAR | Status: DC | PRN
  Administered 2011-05-12 – 2011-05-15 (×7): 1 mg via INTRAVENOUS
  Filled 2011-05-11 (×7): qty 1

## 2011-05-11 MED ORDER — SENNOSIDES-DOCUSATE SODIUM 8.6-50 MG PO TABS
3.0000 | ORAL_TABLET | Freq: Three times a day (TID) | ORAL | Status: DC
  Administered 2011-05-11 (×2): 3 via ORAL
  Filled 2011-05-11 (×9): qty 3

## 2011-05-11 MED ORDER — TRACE MINERALS CR-CU-MN-SE-ZN 10-1000-500-60 MCG/ML IV SOLN
INTRAVENOUS | Status: AC
  Administered 2011-05-11: 18:00:00 via INTRAVENOUS
  Filled 2011-05-11: qty 1440

## 2011-05-11 MED ORDER — HYDROMORPHONE HCL PF 1 MG/ML IJ SOLN
1.0000 mg | INTRAMUSCULAR | Status: DC | PRN
  Administered 2011-05-11: 1 mg via INTRAMUSCULAR
  Filled 2011-05-11: qty 1

## 2011-05-11 MED ORDER — PROMETHAZINE HCL 25 MG/ML IJ SOLN
12.5000 mg | Freq: Once | INTRAMUSCULAR | Status: AC
  Administered 2011-05-11: 12.5 mg via INTRAVENOUS
  Filled 2011-05-11 (×2): qty 1

## 2011-05-11 NOTE — Progress Notes (Signed)
PROGRESS NOTE  Krystal [name] MRN:[id number] DOB: 03/10/1970 DOA: 04/29/2011 PCP: LIVESAY,LENNIS P, MD, MD  Brief narrative: Krystal [name] is a 41-year-old female with a past medical history of endometrial cancer who was admitted on 04/29/2011 with abdominal cramping and a partial small bowel obstruction. She is being followed by surgery with no plans for surgical intervention at present. The plan, at this point, is to have interventional radiology place a G-tube for symptom management given her recurrent problems with nausea and vomiting.  Assessment/Plan: Principal Problem:  *Partial small bowel obstruction  Surgical consultation performed by Dr. Brian Layton on 05/03/11.  Surgical intervention felt to be only used as a last resort.  Treated conservatively with bowel rest and NG tube decompression of the stomach.  Having intermittent bowel movements and passing flatus at this time.  NG tube out as of 05/09/2011. Refuses to have replaced at this time.  Surgery consultants recommending G-tube placement at this time.  Dr. Hassell of interventional radiology consulted for placement of the G-tube.  We'll begin to wean TNA. Active Problems:  Endometrial ca  Status post cycle 1 cisplatin/Adriamycin.  Rapidly progressive per Dr. Livesay's notes. Neutropenia / thrombocytopenia  Related to prior chemotherapy.   Status post treatment with Neupogen, now discontinued.  No evidence of bleeding or infection.  White blood cell count now normal and platelet count currently stable. Chronic constipation  Unable to tolerate MiraLAX.  Senokot-S and Dulcolax suppositories 3 times a day ordered by oncologist.  Would defer to surgery regarding the question of the benefit of Reglan as this medication can cause bowel perforation and obstruction. Inadequate oral intake  Wean TNA.  Hypertension  Started on a clonidine patch 05/06/2011 with subsequent good blood pressure control.  Was on Norvasc  as an outpatient.  Acute renal failure/mild hydronephrosis  Creatinine 1.2 on admission  Slightly worse, likely due to discontinuation of supplemental IV fluids. Normocytic anemia  Likely from anemia of chronic disease and the sequela of chemotherapy.  Hemoglobin stable.  Code Status: Full Family Communication: Updated at bedside. Disposition Plan: Home, when stable.  Medical Consultants:  Dr. Lennis Livesay, Oncology  Dr. Brian Layton, Surgery  Dr. Hassell, Interventional radiology  Other consultants:  Pharmacy  Antibiotics:  None   Subjective  Krystal [name] has been unable to tolerate clear liquids and has had large volume emesis over the past 24 hours. She has also had some periumbilical abdominal pain. Continues to report having flatus and occasional small stools.   Objective    Interim History: Stable overnight.   Objective: Filed Vitals:   05/09/11 1954 05/10/11 1300 05/10/11 2333 05/11/11 0400  BP: 114/78 113/79 108/78 107/77  Pulse: 103 104 106 94  Temp: 99.3 F (37.4 C) 97.9 F (36.6 C) 100.3 F (37.9 C) 99 F (37.2 C)  TempSrc: Oral  Oral Oral  Resp: 18 20 16 20  Height:      Weight:      SpO2: 97% 90% 94% 96%    Intake/Output Summary (Last 24 hours) at 05/11/11 0947 Last data filed at 05/11/11 0544  Gross per 24 hour  Intake 1949.16 ml  Output    702 ml  Net 1247.16 ml    Exam: Gen:  NAD Cardiovascular:  RRR, No M/R/G Respiratory: Lungs CTAB Gastrointestinal: Abdomen very distended with sluggish bowel sounds. Extremities: No C/E/C    Data Reviewed: Basic Metabolic Panel:  Lab 05/11/11 0406 05/10/11 0406 05/09/11 0630 05/08/11 0415 05/07/11 0500 05/04/11 1055  NA 135 135 137   138 139 --  K 3.6 3.8 -- -- -- --  CL 96 98 100 102 104 --  CO2 27 25 28 28 27 --  GLUCOSE 120* 115* 116* 105* 94 --  BUN 29* 22 17 9 7 --  CREATININE 1.21* 1.22* 1.14* 1.18* 1.03 --  CALCIUM 9.2 9.2 9.3 9.1 9.1 --  MG 2.2 -- 1.9 1.8 1.7 1.7  PHOS  4.7* -- 4.6 4.4 3.5 --   GFR Estimated Creatinine Clearance: 68.9 ml/min (by C-G formula based on Cr of 1.21). Liver Function Tests:  Lab 05/11/11 0406 05/08/11 0415 05/07/11 0500  AST 78* 35 25  ALT 89* 34 20  ALKPHOS 121* 106 96  BILITOT 0.6 0.6 0.7  PROT 7.0 6.9 6.7  ALBUMIN 2.9* 2.9* 2.8*    CBC:  Lab 05/11/11 0406 05/09/11 0630 05/08/11 0415 05/07/11 0500 05/06/11 0500 05/04/11 1055  WBC 5.8 6.0 2.2* 1.3* 1.2* --  NEUTROABS -- -- 1.3* 0.5* 0.6* 1.3*  HGB 9.4* 9.1* 8.7* 8.3* 8.4* --  HCT 31.0* 29.3* 28.3* 27.0* 27.2* --  MCV 82.9 82.3 83.0 82.3 81.7 --  PLT 213 134* 115* 116* 132* --   Microbiology Recent Results (from the past 240 hour(s))  URINE CULTURE     Status: Normal   Collection Time   05/03/11 10:27 PM      Component Value Range Status Comment   Specimen Description URINE, RANDOM   Final    Special Requests NONE   Final    Culture  Setup Time [id number]   Final    Colony Count 25,000 COLONIES/ML   Final    Culture     Final    Value: Multiple bacterial morphotypes present, none predominant. Suggest appropriate recollection if clinically indicated.   Report Status 05/05/2011 FINAL   Final   CULTURE, BLOOD (ROUTINE X 2)     Status: Normal   Collection Time   05/03/11 11:10 PM      Component Value Range Status Comment   Specimen Description BLOOD RIGHT PAC   Final    Special Requests BOTTLES DRAWN AEROBIC AND ANAEROBIC 5CC   Final    Culture  Setup Time [id number]   Final    Culture NO GROWTH 5 DAYS   Final    Report Status 05/10/2011 FINAL   Final     Procedures and Diagnostic Studies:  Dg Abd 1 View 05/06/2011 IMPRESSION: Worsening dilatation of the small intestine consistent with worsening partial small bowel obstruction.  Original Report Authenticated By: MARK E. SHOGRY, M.D.    Dg Abd 1 View 05/03/2011 IMPRESSION:  1.  Persistent retained contrast.  Last contrast exam 04/29/2011. 2.  Persistent dilatation of small bowel loops, consistent with at  least partial small bowel obstruction.  Original Report Authenticated By: ELIZABETH D. BROWN, M.D.    Dg Abd 1 View 05/01/2011 IMPRESSION: Recurrent small bowel dilatation as demonstrated on recent CT and concerning for partial small-bowel obstruction, possibly on the basis of adhesions or peritoneal carcinomatosis.  Original Report Authenticated By: WILLIAM B. VEAZEY, M.D.    Abd 1 View (kub) 04/30/2011  IMPRESSION: Normal stool and bowel gas pattern.  Original Report Authenticated By: CARON B. DOVER, M.D.    Ct Abdomen Pelvis W Contrast 04/29/2011 IMPRESSION: Dilated loops of small bowel in the left upper abdomen, compatible with at least early/partial small bowel obstruction, possibly on the basis of adhesions.  Additional suspected focal transition in the left mid abdomen.  Interval development of moderate abdominopelvic ascites with   peritoneal nodularity, suggesting peritoneal disease.  Gastrohepatic and retroperitoneal nodal metastases, as described above.  Diminished enhancement of the left kidney relative to the right with mild left hydronephrosis, possibly on the basis of extrinsic compression on the mid/lower ureter by peritoneal disease.  Midline lower pelvic ventral hernia containing a loop of small bowel. Original Report Authenticated By: SRIYESH KRISHNAN, M.D.     Dg Chest Port 1 View 05/03/2011  IMPRESSION: Extremely low lung volumes with bilateral lower lobe atelectasis and possible retrocardiac opacity.  PA and lateral chest radiograph at full inspiration are recommended when the patient is clinically able, for better evaluation.  Original Report Authenticated By: GRETCHEN E. GREEN, M.D.    Dg Abd Acute W/chest 05/04/2011 IMPRESSION: Bowel gas pattern suggests residual partial small bowel obstruction.  Original Report Authenticated By: MELINDA A. BLIETZ, M.D.    Scheduled Meds:    . bisacodyl  10 mg Rectal TID  . cloNIDine  0.2 mg Transdermal Weekly  . enoxaparin  40 mg  Subcutaneous Q24H  . insulin aspart  0-9 Units Subcutaneous Q4H  . lactulose  30 g Oral TID  . senna-docusate  3 tablet Oral TID   Continuous Infusions:    . TPN (CLINIMIX) +/- additives 70 mL/hr at 05/09/11 1744   And  . fat emulsion 250 mL (05/09/11 1744)  . fat emulsion 250 mL (05/10/11 1741)  . fat emulsion    . DISCONTD: TPN (CLINIMIX) +/- additives    . DISCONTD: TPN (CLINIMIX) +/- additives 80 mL/hr at 05/10/11 1740      LOS: 12 days   Krystal Scheiber, MD Pager [phone]  05/11/2011, 9:47 AM  Patient teaching/information given to the patient and reviewed with the patient:  Krystal [name] 05/11/2011  Treatment team:  Dr. Hend Mccarrell, Hospitalist (Internist)  Dr. Lennis Livesay, Oncologist  Dr. Brian Layton (and partners), Surgeon  Dr. Hassell, Interventional radiologist  Medical Issues and plan: Principal Problem:  *Partial blockage of the bowel  Surgical consultation performed by Dr. Brian Layton on 05/03/11.  Surgical intervention felt to be only used as a last resort.  Treated conservatively with bowel rest and NG tube decompression of the stomach.  Having bowel movements and passing gas is a positive sign.  Another option for decompressing her stomach is to place a G-tube into your stomach through the skin. This will be placed by an interventional radiologist. The interventional radiologists PA will come and talk with you today about this procedure..  Try sips of clear liquids, as tolerated. Active Problems:  Endometrial cancer  Status post chemotherapy.  Further treatment per Dr. Livesay. Low white cells and platelets  Related to prior chemotherapy. Low white cells can put you at risk for infection and low platelets can cause problems with bleeding.  Neupogen discontinued because your white blood cell count is now normal.   No evidence of bleeding. Chronic constipation  Senokot S. and Dulcolax suppositories have been  ordered. Inadequate oral intake  TNA. Started. This is a temporary treatment for malnourishment.  We will begin to taper this to see if this improves her GI function. High blood pressure  Started on a clonidine patch 05/06/2011 with subsequent good blood pressure control.  Was on Norvasc as an outpatient, but this is on hold since you have an NG tube in. Mild dehydration  Continue IV fluids. Low red cells  Likely from anemia of chronic disease and the sequela of chemotherapy.  Hemoglobin stable.  Anticipated discharge date: Depends on progress.  

## 2011-05-11 NOTE — Interval H&P Note (Cosign Needed)
History and Physical Interval Note:  05/11/2011 11:43 AM  Krystal [name] is tentatively scheduled for percutaneous gastrostomy tube placement/paracentesis on 4/30 secondary to recurrent nausea/vomiting in setting of small bowel obstruction, metastatic endometrial cancer and ascites.  The various methods of treatment have been discussed with the patient and family. After consideration of risks, benefits and other options for treatment, the patient has consented to the above procedures .  The patients' history has been reviewed, imaging studies reviewed by Dr. Deanne Coffer, patient examined, no change in status, stable for the above procedures. Chest- decreased BS bases, heart-RRR, abd.- distended, hypoactive BS, diffuse tenderness.  I have reviewed the patients' chart and labs.  Questions were answered to the patient's satisfaction.   Past Medical History  Diagnosis Date  . Hypertension     SINCE FIRST PREGNANCY  . Asthma   . Constipation   . History of miscarriage   . Preeclampsia   . Postoperative ileus 07/2010  . Iron deficiency anemia     s/p transfusions [facility]  . Uterine cancer    Past Surgical History  Procedure Date  . Cesarean section   . Abdominal hysterectomy 07-08-10     TAH-BSO, WITH OMENTECTOMY AND BILAT. PELVIC AND PERIAORTIC LYMPH NODE DISSECTION  . Appendectomy     IN CHILDHOOD   Dg Abd 1 View  05/11/2011  *RADIOLOGY REPORT*  Clinical Data: Follow-up bowel obstruction.  ABDOMEN - 1 VIEW  Comparison: Abdominal radiograph 05/06/2011, 05/04/2011, 04/29/2011.  Findings: There is persistent small bowel dilatation.  Small bowel loops measure up to 5 cm in diameter.  There is some gas within the colon.  Loss of normal abdominal fat planes is consistent with the patient's known ascites.  No free intraperitoneal air is detected on these supine radiographs.  Surgical clips are present in the right aspect of the pelvis, and there is a single surgical clip in the left mid abdomen.  IMPRESSION:  1. No significant change in small bowel obstruction pattern compared to the abdominal radiographs of 05/06/2011. 2.  Evidence of ascites.  Original Report Authenticated By: Britta Mccreedy, M.D.   Dg Abd 1 View  05/06/2011  *RADIOLOGY REPORT*  Clinical Data: Follow-up small bowel obstruction  ABDOMEN - 1 VIEW  Comparison: 05/04/2011  Findings: Nasogastric tube has its tip in the midportion of the stomach.  There is persistent and worsening small bowel dilatation consistent with worsening partial small bowel obstruction.  Some air and contrast are present in the colon.  IMPRESSION: Worsening dilatation of the small intestine consistent with worsening partial small bowel obstruction.  Original Report Authenticated By: Thomasenia Sales, M.D.   Dg Abd 1 View  05/03/2011  *RADIOLOGY REPORT*  Clinical Data: Evaluate possible small bowel obstruction. Distension and abdominal pain.  ABDOMEN - 1 VIEW  Comparison: 05/01/2011  Findings: Nasogastric tube is in place, tip overlying the level of the stomach.  There is residual contrast within colonic loops. There is persistent dilatation of central small bowel loops.  No evidence for free intraperitoneal air on these supine views.  IMPRESSION:  1.  Persistent retained contrast.  Last contrast exam 04/29/2011. 2.  Persistent dilatation of small bowel loops, consistent with at least partial small bowel obstruction.  Original Report Authenticated By: Patterson Hammersmith, M.D.   Dg Abd 1 View  05/01/2011  *RADIOLOGY REPORT*  Clinical Data: Recent onset of abdominal pain with nausea and vomiting.  History of hysterectomy 10 months ago for uterine cancer.  ABDOMEN - 1 VIEW  Comparison:  Abdominal radiographs 04/30/2011.  CT 04/29/2011.  Findings: There are moderately dilated loops of small bowel in the midabdomen as demonstrated on recent CT.  Contrast has passed into the colon which is normal in caliber.  There is no supine evidence of free intraperitoneal air.  There is persistent  evidence of ascites as seen on CT.  Surgical clips are present within the right pelvis.  IMPRESSION: Recurrent small bowel dilatation as demonstrated on recent CT and concerning for partial small-bowel obstruction, possibly on the basis of adhesions or peritoneal carcinomatosis.  Original Report Authenticated By: Gerrianne Scale, M.D.   Abd 1 View (kub)  04/30/2011  *RADIOLOGY REPORT*  Clinical Data: Abdominal pain and distention, status post hysterectomy  ABDOMEN - 1 VIEW  Comparison: CT scan performed yesterday.  Findings: The previously seen dilated small bowel loops have resolved.  Stool and oral contrast are present throughout the colon.  No pneumoperitoneum.  No gross organomegaly.  IMPRESSION: Normal stool and bowel gas pattern.  Original Report Authenticated By: Brandon Melnick, M.D.   Ct Abdomen Pelvis W Contrast  04/29/2011  **ADDENDUM** CREATED: 04/29/2011 09:14:54  These results were called by telephone on 04/29/2011  at  0915 hours to  Dr. Raeford Razor, who verbally acknowledged these results.  **END ADDENDUM** SIGNED BY: Charline Bills, M.D.    04/29/2011  *RADIOLOGY REPORT*  Clinical Data: Abdominal pain, endometrial cancer diagnosed 06/2010 status post hysterectomy, omentectomy, and bilateral pelvic and para-aortic lymph node dissection, ongoing chemotherapy, prior appendectomy  CT ABDOMEN AND PELVIS WITH CONTRAST  Technique:  Multidetector CT imaging of the abdomen and pelvis was performed following the standard protocol during bolus administration of intravenous contrast.  Contrast: OMNIPAQUE IOHEXOL 300 MG/ML  SOLN  Comparison: PET CT dated 04/07/2011  Findings: Mild patchy opacities at the lung bases.  Small cysts and too small to characterize lesions in the liver, unchanged.  Spleen, pancreas, and adrenal glands are within normal limits.  Gallbladder is underdistended.  No intrahepatic or extrahepatic ductal dilatation.  Left kidney is notable for mild left hydronephrosis and  diminished enhancement relative to the right.  Dilated loops of small bowel in the left upper abdomen.  Relative narrowing in the central abdomen (series 2/image 52, possibly reflecting a focal transition, although distal pelvic loops of bowel are not decompressed (series 2/image 79).  At the time of the study, oral contrast has not yet passed to this area.  Angulation of a loop of small bowel in the central abdomen (series 2/image 52), suggesting adhesions.  Additional transition in the left mid abdomen (series 2/image 57) with decompressed distal loops of small bowel.  Midline lower pelvic ventral hernia containing a loop of small bowel (series 2/image 81).  No evidence of abdominal aortic aneurysm.  1.7 cm short axis gastrohepatic nodal metastasis (series 2/image 24).  Additional smaller retroperitoneal nodes measuring up to 8 mm short axis (series 2/image 47). Prior para-aortic/pelvic lymph node dissection.  Interval development of moderate abdominopelvic ascites with peritoneal nodularity in the left upper abdomen (series 2/image 37), suspicious for peritoneal disease.  Status post hysterectomy.  No adnexal masses.  No ureteral or bladder calculi.  Mid/distal left ureter is narrowed, possibly reflecting extensive compression.  Bladder is within normal limits.  Visualized osseous structures are within normal limits.  IMPRESSION: Dilated loops of small bowel in the left upper abdomen, compatible with at least early/partial small bowel obstruction, possibly on the basis of adhesions.  Additional suspected focal transition in the left mid  abdomen.  Interval development of moderate abdominopelvic ascites with peritoneal nodularity, suggesting peritoneal disease.  Gastrohepatic and retroperitoneal nodal metastases, as described above.  Diminished enhancement of the left kidney relative to the right with mild left hydronephrosis, possibly on the basis of extrinsic compression on the mid/lower ureter by peritoneal  disease.  Midline lower pelvic ventral hernia containing a loop of small bowel. Original Report Authenticated By: Charline Bills, M.D.   Dg Chest Port 1 View  05/03/2011  *RADIOLOGY REPORT*  Clinical Data: Fever, weakness  PORTABLE CHEST - 1 VIEW  Comparison: PET CT 04/07/2011, chest CT 03/22/2011, chest radiograph 03/22/2011  Findings: Right-sided Port-A-Cath tip terminates over the cavoatrial junction.  Nasogastric tube terminates below the level of the diaphragms but the tip is not included on the film.  Lung volumes are extremely low, with curvilinear bilateral lower lobe atelectasis.  Underlying pulmonary consolidation could be obscured. There is a suggestion of retrocardiac patchy airspace opacity but this is not well visualized.  No pleural effusion.  IMPRESSION: Extremely low lung volumes with bilateral lower lobe atelectasis and possible retrocardiac opacity.  PA and lateral chest radiograph at full inspiration are recommended when the patient is clinically able, for better evaluation.  Original Report Authenticated By: Harrel Lemon, M.D.   Dg Abd Acute W/chest  05/04/2011  *RADIOLOGY REPORT*  Clinical Data: Constipation with mid abdominal pain.  Uterine cancer.  ACUTE ABDOMEN SERIES (ABDOMEN 2 VIEW & CHEST 1 VIEW)  Comparison: 05/03/2011.  Findings: Frontal view of the chest shows a right IJ power port tip projecting over the SVC.  Nasogastric tube terminates in the stomach.  Trachea is midline.  Heart size stable.  Lungs are somewhat low in volume with minimal bibasilar atelectasis.  Two views of the abdomen show dilated loops of small bowel with associated air fluid levels.  Oral contrast and air fluid levels are seen in the ascending colon. Oral contrast is also seen in the rectosigmoid colon.  IMPRESSION: Bowel gas pattern suggests residual partial small bowel obstruction.  Original Report Authenticated By: Reyes Ivan, M.D.  Results for orders placed during the hospital encounter  of 04/29/11  CBC      Component Value Range   WBC 11.9 (*) 4.0 - 10.5 (K/uL)   RBC 4.06  3.87 - 5.11 (MIL/uL)   Hemoglobin 10.5 (*) 12.0 - 15.0 (g/dL)   HCT 16.1 (*) 09.6 - 46.0 (%)   MCV 82.0  78.0 - 100.0 (fL)   MCH 25.9 (*) 26.0 - 34.0 (pg)   MCHC 31.5  30.0 - 36.0 (g/dL)   RDW 04.5 (*) 40.9 - 15.5 (%)   Platelets 315  150 - 400 (K/uL)  DIFFERENTIAL      Component Value Range   Neutrophils Relative 87 (*) 43 - 77 (%)   Neutro Abs 10.4 (*) 1.7 - 7.7 (K/uL)   Lymphocytes Relative 7 (*) 12 - 46 (%)   Lymphs Abs 0.8  0.7 - 4.0 (K/uL)   Monocytes Relative 6  3 - 12 (%)   Monocytes Absolute 0.7  0.1 - 1.0 (K/uL)   Eosinophils Relative 0  0 - 5 (%)   Eosinophils Absolute 0.0  0.0 - 0.7 (K/uL)   Basophils Relative 0  0 - 1 (%)   Basophils Absolute 0.0  0.0 - 0.1 (K/uL)  COMPREHENSIVE METABOLIC PANEL      Component Value Range   Sodium 139  135 - 145 (mEq/L)   Potassium 4.2  3.5 - 5.1 (mEq/L)  Chloride 103  96 - 112 (mEq/L)   CO2 25  19 - 32 (mEq/L)   Glucose, Bld 115 (*) 70 - 99 (mg/dL)   BUN 19  6 - 23 (mg/dL)   Creatinine, Ser 1.61  0.50 - 1.10 (mg/dL)   Calcium 9.5  8.4 - 09.6 (mg/dL)   Total Protein 7.9  6.0 - 8.3 (g/dL)   Albumin 3.8  3.5 - 5.2 (g/dL)   AST 25  0 - 37 (U/L)   ALT 23  0 - 35 (U/L)   Alkaline Phosphatase 99  39 - 117 (U/L)   Total Bilirubin 0.4  0.3 - 1.2 (mg/dL)   GFR calc non Af Amer 61 (*) >90 (mL/min)   GFR calc Af Amer 71 (*) >90 (mL/min)  LIPASE, BLOOD      Component Value Range   Lipase 18  11 - 59 (U/L)  URINALYSIS, WITH MICROSCOPIC      Component Value Range   Color, Urine YELLOW  YELLOW    APPearance CLEAR  CLEAR    Specific Gravity, Urine 1.019  1.005 - 1.030    pH 6.0  5.0 - 8.0    Glucose, UA NEGATIVE  NEGATIVE (mg/dL)   Hgb urine dipstick NEGATIVE  NEGATIVE    Bilirubin Urine NEGATIVE  NEGATIVE    Ketones, ur NEGATIVE  NEGATIVE (mg/dL)   Protein, ur NEGATIVE  NEGATIVE (mg/dL)   Urobilinogen, UA 0.2  0.0 - 1.0 (mg/dL)   Nitrite  NEGATIVE  NEGATIVE    Leukocytes, UA NEGATIVE  NEGATIVE    WBC, UA 7-10  <3 (WBC/hpf)   RBC / HPF 0-2  <3 (RBC/hpf)   Bacteria, UA FEW (*) RARE    Squamous Epithelial / LPF FEW (*) RARE   MAGNESIUM      Component Value Range   Magnesium 2.4  1.5 - 2.5 (mg/dL)  COMPREHENSIVE METABOLIC PANEL      Component Value Range   Sodium 136  135 - 145 (mEq/L)   Potassium 4.1  3.5 - 5.1 (mEq/L)   Chloride 103  96 - 112 (mEq/L)   CO2 26  19 - 32 (mEq/L)   Glucose, Bld 83  70 - 99 (mg/dL)   BUN 17  6 - 23 (mg/dL)   Creatinine, Ser 0.45 (*) 0.50 - 1.10 (mg/dL)   Calcium 8.9  8.4 - 40.9 (mg/dL)   Total Protein 6.9  6.0 - 8.3 (g/dL)   Albumin 3.2 (*) 3.5 - 5.2 (g/dL)   AST 20  0 - 37 (U/L)   ALT 20  0 - 35 (U/L)   Alkaline Phosphatase 82  39 - 117 (U/L)   Total Bilirubin 0.4  0.3 - 1.2 (mg/dL)   GFR calc non Af Amer 54 (*) >90 (mL/min)   GFR calc Af Amer 63 (*) >90 (mL/min)  CBC      Component Value Range   WBC 5.0  4.0 - 10.5 (K/uL)   RBC 4.07  3.87 - 5.11 (MIL/uL)   Hemoglobin 10.5 (*) 12.0 - 15.0 (g/dL)   HCT 81.1 (*) 91.4 - 46.0 (%)   MCV 82.8  78.0 - 100.0 (fL)   MCH 25.8 (*) 26.0 - 34.0 (pg)   MCHC 31.2  30.0 - 36.0 (g/dL)   RDW 78.2 (*) 95.6 - 15.5 (%)   Platelets 253  150 - 400 (K/uL)  URINE CULTURE      Component Value Range   Specimen Description URINE, CLEAN CATCH     Special Requests none  Culture  Setup Time [id number]     Colony Count NO GROWTH     Culture NO GROWTH     Report Status 04/30/2011 FINAL    BASIC METABOLIC PANEL      Component Value Range   Sodium 139  135 - 145 (mEq/L)   Potassium 3.8  3.5 - 5.1 (mEq/L)   Chloride 105  96 - 112 (mEq/L)   CO2 26  19 - 32 (mEq/L)   Glucose, Bld 82  70 - 99 (mg/dL)   BUN 16  6 - 23 (mg/dL)   Creatinine, Ser 8.11 (*) 0.50 - 1.10 (mg/dL)   Calcium 8.6  8.4 - 91.4 (mg/dL)   GFR calc non Af Amer 55 (*) >90 (mL/min)   GFR calc Af Amer 63 (*) >90 (mL/min)  CBC      Component Value Range   WBC 14.6 (*) 4.0 - 10.5  (K/uL)   RBC 3.55 (*) 3.87 - 5.11 (MIL/uL)   Hemoglobin 9.2 (*) 12.0 - 15.0 (g/dL)   HCT 78.2 (*) 95.6 - 46.0 (%)   MCV 82.8  78.0 - 100.0 (fL)   MCH 25.9 (*) 26.0 - 34.0 (pg)   MCHC 31.3  30.0 - 36.0 (g/dL)   RDW 21.3 (*) 08.6 - 15.5 (%)   Platelets 202  150 - 400 (K/uL)  DIFFERENTIAL      Component Value Range   Neutrophils Relative 94 (*) 43 - 77 (%)   Neutro Abs 13.8 (*) 1.7 - 7.7 (K/uL)   Lymphocytes Relative 6 (*) 12 - 46 (%)   Lymphs Abs 0.8  0.7 - 4.0 (K/uL)   Monocytes Relative 0 (*) 3 - 12 (%)   Monocytes Absolute 0.0 (*) 0.1 - 1.0 (K/uL)   Eosinophils Relative 0  0 - 5 (%)   Eosinophils Absolute 0.0  0.0 - 0.7 (K/uL)   Basophils Relative 0  0 - 1 (%)   Basophils Absolute 0.0  0.0 - 0.1 (K/uL)  CBC      Component Value Range   WBC 15.3 (*) 4.0 - 10.5 (K/uL)   RBC 3.61 (*) 3.87 - 5.11 (MIL/uL)   Hemoglobin 9.3 (*) 12.0 - 15.0 (g/dL)   HCT 57.8 (*) 46.9 - 46.0 (%)   MCV 81.7  78.0 - 100.0 (fL)   MCH 25.8 (*) 26.0 - 34.0 (pg)   MCHC 31.5  30.0 - 36.0 (g/dL)   RDW 62.9 (*) 52.8 - 15.5 (%)   Platelets 225  150 - 400 (K/uL)  DIFFERENTIAL      Component Value Range   Neutrophils Relative 90 (*) 43 - 77 (%)   Neutro Abs 4.8  1.7 - 7.7 (K/uL)   Lymphocytes Relative 9 (*) 12 - 46 (%)   Lymphs Abs 0.5 (*) 0.7 - 4.0 (K/uL)   Monocytes Relative 1 (*) 3 - 12 (%)   Monocytes Absolute 0.1  0.1 - 1.0 (K/uL)   Eosinophils Relative 0  0 - 5 (%)   Eosinophils Absolute 0.0  0.0 - 0.7 (K/uL)   Basophils Relative 0  0 - 1 (%)   Basophils Absolute 0.0  0.0 - 0.1 (K/uL)  BASIC METABOLIC PANEL      Component Value Range   Sodium 141  135 - 145 (mEq/L)   Potassium 3.6  3.5 - 5.1 (mEq/L)   Chloride 105  96 - 112 (mEq/L)   CO2 21  19 - 32 (mEq/L)   Glucose, Bld 65 (*) 70 - 99 (mg/dL)  BUN 12  6 - 23 (mg/dL)   Creatinine, Ser 8.29  0.50 - 1.10 (mg/dL)   Calcium 8.9  8.4 - 56.2 (mg/dL)   GFR calc non Af Amer 71 (*) >90 (mL/min)   GFR calc Af Amer 82 (*) >90 (mL/min)  CBC       Component Value Range   WBC 5.3  4.0 - 10.5 (K/uL)   RBC 3.34 (*) 3.87 - 5.11 (MIL/uL)   Hemoglobin 8.5 (*) 12.0 - 15.0 (g/dL)   HCT 13.0 (*) 86.5 - 46.0 (%)   MCV 82.6  78.0 - 100.0 (fL)   MCH 25.4 (*) 26.0 - 34.0 (pg)   MCHC 30.8  30.0 - 36.0 (g/dL)   RDW 78.4 (*) 69.6 - 15.5 (%)   Platelets 193  150 - 400 (K/uL)  CULTURE, BLOOD (ROUTINE X 2)      Component Value Range   Specimen Description BLOOD RIGHT PAC     Special Requests BOTTLES DRAWN AEROBIC AND ANAEROBIC 5CC     Culture  Setup Time [id number]     Culture NO GROWTH 5 DAYS     Report Status 05/10/2011 FINAL    URINALYSIS, ROUTINE W REFLEX MICROSCOPIC      Component Value Range   Color, Urine YELLOW  YELLOW    APPearance CLEAR  CLEAR    Specific Gravity, Urine 1.015  1.005 - 1.030    pH 6.0  5.0 - 8.0    Glucose, UA NEGATIVE  NEGATIVE (mg/dL)   Hgb urine dipstick NEGATIVE  NEGATIVE    Bilirubin Urine NEGATIVE  NEGATIVE    Ketones, ur >80 (*) NEGATIVE (mg/dL)   Protein, ur NEGATIVE  NEGATIVE (mg/dL)   Urobilinogen, UA 1.0  0.0 - 1.0 (mg/dL)   Nitrite NEGATIVE  NEGATIVE    Leukocytes, UA NEGATIVE  NEGATIVE   URINE CULTURE      Component Value Range   Specimen Description URINE, RANDOM     Special Requests NONE     Culture  Setup Time [id number]     Colony Count 25,000 COLONIES/ML     Culture       Value: Multiple bacterial morphotypes present, none predominant. Suggest appropriate recollection if clinically indicated.   Report Status 05/05/2011 FINAL    CBC      Component Value Range   WBC 2.9 (*) 4.0 - 10.5 (K/uL)   RBC 3.29 (*) 3.87 - 5.11 (MIL/uL)   Hemoglobin 8.5 (*) 12.0 - 15.0 (g/dL)   HCT 34.7 (*) 42.5 - 46.0 (%)   MCV 82.4  78.0 - 100.0 (fL)   MCH 25.8 (*) 26.0 - 34.0 (pg)   MCHC 31.4  30.0 - 36.0 (g/dL)   RDW 95.6  38.7 - 56.4 (%)   Platelets 178  150 - 400 (K/uL)  DIFFERENTIAL      Component Value Range   Neutrophils Relative 81 (*) 43 - 77 (%)   Lymphocytes Relative 17  12 - 46 (%)    Monocytes Relative 1 (*) 3 - 12 (%)   Eosinophils Relative 1  0 - 5 (%)   Basophils Relative 0  0 - 1 (%)   Neutro Abs 2.4  1.7 - 7.7 (K/uL)   Lymphs Abs 0.5 (*) 0.7 - 4.0 (K/uL)   Monocytes Absolute 0.0 (*) 0.1 - 1.0 (K/uL)   Eosinophils Absolute 0.0  0.0 - 0.7 (K/uL)   Basophils Absolute 0.0  0.0 - 0.1 (K/uL)   RBC Morphology OVALOCYTES     WBC  Morphology DOHLE BODIES    BASIC METABOLIC PANEL      Component Value Range   Sodium 137  135 - 145 (mEq/L)   Potassium 3.5  3.5 - 5.1 (mEq/L)   Chloride 103  96 - 112 (mEq/L)   CO2 19  19 - 32 (mEq/L)   Glucose, Bld 62 (*) 70 - 99 (mg/dL)   BUN 12  6 - 23 (mg/dL)   Creatinine, Ser 9.60  0.50 - 1.10 (mg/dL)   Calcium 8.8  8.4 - 45.4 (mg/dL)   GFR calc non Af Amer 71 (*) >90 (mL/min)   GFR calc Af Amer 83 (*) >90 (mL/min)  MAGNESIUM      Component Value Range   Magnesium 1.7  1.5 - 2.5 (mg/dL)  CBC      Component Value Range   WBC 1.7 (*) 4.0 - 10.5 (K/uL)   RBC 3.25 (*) 3.87 - 5.11 (MIL/uL)   Hemoglobin 8.4 (*) 12.0 - 15.0 (g/dL)   HCT 09.8 (*) 11.9 - 46.0 (%)   MCV 82.2  78.0 - 100.0 (fL)   MCH 25.8 (*) 26.0 - 34.0 (pg)   MCHC 31.5  30.0 - 36.0 (g/dL)   RDW 14.7  82.9 - 56.2 (%)   Platelets 176  150 - 400 (K/uL)  DIFFERENTIAL      Component Value Range   Neutrophils Relative 75  43 - 77 (%)   Neutro Abs 1.3 (*) 1.7 - 7.7 (K/uL)   Lymphocytes Relative 20  12 - 46 (%)   Lymphs Abs 0.3 (*) 0.7 - 4.0 (K/uL)   Monocytes Relative 3  3 - 12 (%)   Monocytes Absolute 0.1  0.1 - 1.0 (K/uL)   Eosinophils Relative 1  0 - 5 (%)   Eosinophils Absolute 0.0  0.0 - 0.7 (K/uL)   Basophils Relative 1  0 - 1 (%)   Basophils Absolute 0.0  0.0 - 0.1 (K/uL)   WBC Morphology TOXIC GRANULATION    BASIC METABOLIC PANEL      Component Value Range   Sodium 136  135 - 145 (mEq/L)   Potassium 3.7  3.5 - 5.1 (mEq/L)   Chloride 103  96 - 112 (mEq/L)   CO2 23  19 - 32 (mEq/L)   Glucose, Bld 103 (*) 70 - 99 (mg/dL)   BUN 9  6 - 23 (mg/dL)    Creatinine, Ser 1.30  0.50 - 1.10 (mg/dL)   Calcium 9.0  8.4 - 86.5 (mg/dL)   GFR calc non Af Amer 73 (*) >90 (mL/min)   GFR calc Af Amer 85 (*) >90 (mL/min)  CBC      Component Value Range   WBC 1.0 (*) 4.0 - 10.5 (K/uL)   RBC 3.53 (*) 3.87 - 5.11 (MIL/uL)   Hemoglobin 9.0 (*) 12.0 - 15.0 (g/dL)   HCT 78.4 (*) 69.6 - 46.0 (%)   MCV 81.3  78.0 - 100.0 (fL)   MCH 25.5 (*) 26.0 - 34.0 (pg)   MCHC 31.4  30.0 - 36.0 (g/dL)   RDW 29.5  28.4 - 13.2 (%)   Platelets 162  150 - 400 (K/uL)  DIFFERENTIAL      Component Value Range   Neutrophils Relative 0 (*) 43 - 77 (%)   Lymphocytes Relative 0 (*) 12 - 46 (%)   Monocytes Relative 0 (*) 3 - 12 (%)   Eosinophils Relative 0  0 - 5 (%)   Basophils Relative 0  0 - 1 (%)  Band Neutrophils 0  0 - 10 (%)   nRBC 0  0 (/100 WBC)   Lymphs Abs 0.0 (*) 0.7 - 4.0 (K/uL)   Monocytes Absolute 0.0 (*) 0.1 - 1.0 (K/uL)   Eosinophils Absolute 0.0  0.0 - 0.7 (K/uL)   Basophils Absolute 0.0  0.0 - 0.1 (K/uL)   RBC Morphology ELLIPTOCYTES     WBC Morphology WHITE COUNT CONFIRMED ON SMEAR     Smear Review PLATELET COUNT CONFIRMED BY SMEAR    DIFFERENTIAL      Component Value Range   Neutrophils Relative 46  43 - 77 (%)   Lymphocytes Relative 44  12 - 46 (%)   Monocytes Relative 7  3 - 12 (%)   Eosinophils Relative 2  0 - 5 (%)   Basophils Relative 1  0 - 1 (%)   Neutro Abs 0.6 (*) 1.7 - 7.7 (K/uL)   Lymphs Abs 0.5 (*) 0.7 - 4.0 (K/uL)   Monocytes Absolute 0.1  0.1 - 1.0 (K/uL)   Eosinophils Absolute 0.0  0.0 - 0.7 (K/uL)   Basophils Absolute 0.0  0.0 - 0.1 (K/uL)   WBC Morphology MILD LEFT SHIFT (1-5% METAS, OCC MYELO, OCC BANDS)     Smear Review PLATELET COUNT CONFIRMED BY SMEAR    DIFFERENTIAL      Component Value Range   Neutrophils Relative 43  43 - 77 (%)   Lymphocytes Relative 45  12 - 46 (%)   Monocytes Relative 12  3 - 12 (%)   Eosinophils Relative 0  0 - 5 (%)   Basophils Relative 0  0 - 1 (%)   Neutro Abs 0.5 (*) 1.7 - 7.7 (K/uL)    Lymphs Abs 0.6 (*) 0.7 - 4.0 (K/uL)   Monocytes Absolute 0.2  0.1 - 1.0 (K/uL)   Eosinophils Absolute 0.0  0.0 - 0.7 (K/uL)   Basophils Absolute 0.0  0.0 - 0.1 (K/uL)   WBC Morphology MILD LEFT SHIFT (1-5% METAS, OCC MYELO, OCC BANDS)     Smear Review LARGE PLATELETS PRESENT    PATHOLOGIST SMEAR REVIEW      Component Value Range   Tech Review Reviewed By Havery Moros, M.D.    CBC      Component Value Range   WBC 1.2 (*) 4.0 - 10.5 (K/uL)   RBC 3.33 (*) 3.87 - 5.11 (MIL/uL)   Hemoglobin 8.4 (*) 12.0 - 15.0 (g/dL)   HCT 16.1 (*) 09.6 - 46.0 (%)   MCV 81.7  78.0 - 100.0 (fL)   MCH 25.2 (*) 26.0 - 34.0 (pg)   MCHC 30.9  30.0 - 36.0 (g/dL)   RDW 04.5  40.9 - 81.1 (%)   Platelets 132 (*) 150 - 400 (K/uL)  BASIC METABOLIC PANEL      Component Value Range   Sodium 139  135 - 145 (mEq/L)   Potassium 4.1  3.5 - 5.1 (mEq/L)   Chloride 105  96 - 112 (mEq/L)   CO2 26  19 - 32 (mEq/L)   Glucose, Bld 100 (*) 70 - 99 (mg/dL)   BUN 7  6 - 23 (mg/dL)   Creatinine, Ser 9.14  0.50 - 1.10 (mg/dL)   Calcium 9.1  8.4 - 78.2 (mg/dL)   GFR calc non Af Amer 71 (*) >90 (mL/min)   GFR calc Af Amer 82 (*) >90 (mL/min)  CBC      Component Value Range   WBC 1.3 (*) 4.0 - 10.5 (K/uL)   RBC  3.28 (*) 3.87 - 5.11 (MIL/uL)   Hemoglobin 8.3 (*) 12.0 - 15.0 (g/dL)   HCT 16.1 (*) 09.6 - 46.0 (%)   MCV 82.3  78.0 - 100.0 (fL)   MCH 25.3 (*) 26.0 - 34.0 (pg)   MCHC 30.7  30.0 - 36.0 (g/dL)   RDW 04.5  40.9 - 81.1 (%)   Platelets 116 (*) 150 - 400 (K/uL)  COMPREHENSIVE METABOLIC PANEL      Component Value Range   Sodium 139  135 - 145 (mEq/L)   Potassium 4.3  3.5 - 5.1 (mEq/L)   Chloride 104  96 - 112 (mEq/L)   CO2 27  19 - 32 (mEq/L)   Glucose, Bld 94  70 - 99 (mg/dL)   BUN 7  6 - 23 (mg/dL)   Creatinine, Ser 9.14  0.50 - 1.10 (mg/dL)   Calcium 9.1  8.4 - 78.2 (mg/dL)   Total Protein 6.7  6.0 - 8.3 (g/dL)   Albumin 2.8 (*) 3.5 - 5.2 (g/dL)   AST 25  0 - 37 (U/L)   ALT 20  0 - 35 (U/L)   Alkaline  Phosphatase 96  39 - 117 (U/L)   Total Bilirubin 0.7  0.3 - 1.2 (mg/dL)   GFR calc non Af Amer 66 (*) >90 (mL/min)   GFR calc Af Amer 76 (*) >90 (mL/min)  MAGNESIUM      Component Value Range   Magnesium 1.7  1.5 - 2.5 (mg/dL)  PHOSPHORUS      Component Value Range   Phosphorus 3.5  2.3 - 4.6 (mg/dL)  CBC      Component Value Range   WBC 2.2 (*) 4.0 - 10.5 (K/uL)   RBC 3.41 (*) 3.87 - 5.11 (MIL/uL)   Hemoglobin 8.7 (*) 12.0 - 15.0 (g/dL)   HCT 95.6 (*) 21.3 - 46.0 (%)   MCV 83.0  78.0 - 100.0 (fL)   MCH 25.5 (*) 26.0 - 34.0 (pg)   MCHC 30.7  30.0 - 36.0 (g/dL)   RDW 08.6  57.8 - 46.9 (%)   Platelets 115 (*) 150 - 400 (K/uL)  DIFFERENTIAL      Component Value Range   Neutrophils Relative 58  43 - 77 (%)   Lymphocytes Relative 27  12 - 46 (%)   Monocytes Relative 13 (*) 3 - 12 (%)   Eosinophils Relative 1  0 - 5 (%)   Basophils Relative 1  0 - 1 (%)   Neutro Abs 1.3 (*) 1.7 - 7.7 (K/uL)   Lymphs Abs 0.6 (*) 0.7 - 4.0 (K/uL)   Monocytes Absolute 0.3  0.1 - 1.0 (K/uL)   Eosinophils Absolute 0.0  0.0 - 0.7 (K/uL)   Basophils Absolute 0.0  0.0 - 0.1 (K/uL)   WBC Morphology MILD LEFT SHIFT (1-5% METAS, OCC MYELO, OCC BANDS)    COMPREHENSIVE METABOLIC PANEL      Component Value Range   Sodium 138  135 - 145 (mEq/L)   Potassium 4.0  3.5 - 5.1 (mEq/L)   Chloride 102  96 - 112 (mEq/L)   CO2 28  19 - 32 (mEq/L)   Glucose, Bld 105 (*) 70 - 99 (mg/dL)   BUN 9  6 - 23 (mg/dL)   Creatinine, Ser 6.29 (*) 0.50 - 1.10 (mg/dL)   Calcium 9.1  8.4 - 52.8 (mg/dL)   Total Protein 6.9  6.0 - 8.3 (g/dL)   Albumin 2.9 (*) 3.5 - 5.2 (g/dL)   AST 35  0 -  37 (U/L)   ALT 34  0 - 35 (U/L)   Alkaline Phosphatase 106  39 - 117 (U/L)   Total Bilirubin 0.6  0.3 - 1.2 (mg/dL)   GFR calc non Af Amer 56 (*) >90 (mL/min)   GFR calc Af Amer 64 (*) >90 (mL/min)  PREALBUMIN      Component Value Range   Prealbumin 9.7 (*) 17.0 - 34.0 (mg/dL)  MAGNESIUM      Component Value Range   Magnesium 1.8  1.5 -  2.5 (mg/dL)  PHOSPHORUS      Component Value Range   Phosphorus 4.4  2.3 - 4.6 (mg/dL)  CHOLESTEROL, TOTAL      Component Value Range   Cholesterol 179  0 - 200 (mg/dL)  TRIGLYCERIDES      Component Value Range   Triglycerides 126  <150 (mg/dL)  GLUCOSE, CAPILLARY      Component Value Range   Glucose-Capillary 90  70 - 99 (mg/dL)   Comment 1 Notify RN    GLUCOSE, CAPILLARY      Component Value Range   Glucose-Capillary 99  70 - 99 (mg/dL)   Comment 1 Notify RN    GLUCOSE, CAPILLARY      Component Value Range   Glucose-Capillary 112 (*) 70 - 99 (mg/dL)   Comment 1 Notify RN    GLUCOSE, CAPILLARY      Component Value Range   Glucose-Capillary 105 (*) 70 - 99 (mg/dL)   Comment 1 Documented in Chart     Comment 2 Notify RN    GLUCOSE, CAPILLARY      Component Value Range   Glucose-Capillary 101 (*) 70 - 99 (mg/dL)   Comment 1 Documented in Chart     Comment 2 Notify RN    GLUCOSE, CAPILLARY      Component Value Range   Glucose-Capillary 106 (*) 70 - 99 (mg/dL)   Comment 1 Documented in Chart     Comment 2 Notify RN    BASIC METABOLIC PANEL      Component Value Range   Sodium 137  135 - 145 (mEq/L)   Potassium 3.8  3.5 - 5.1 (mEq/L)   Chloride 100  96 - 112 (mEq/L)   CO2 28  19 - 32 (mEq/L)   Glucose, Bld 116 (*) 70 - 99 (mg/dL)   BUN 17  6 - 23 (mg/dL)   Creatinine, Ser 2.72 (*) 0.50 - 1.10 (mg/dL)   Calcium 9.3  8.4 - 53.6 (mg/dL)   GFR calc non Af Amer 58 (*) >90 (mL/min)   GFR calc Af Amer 67 (*) >90 (mL/min)  MAGNESIUM      Component Value Range   Magnesium 1.9  1.5 - 2.5 (mg/dL)  PHOSPHORUS      Component Value Range   Phosphorus 4.6  2.3 - 4.6 (mg/dL)  CBC      Component Value Range   WBC 6.0  4.0 - 10.5 (K/uL)   RBC 3.56 (*) 3.87 - 5.11 (MIL/uL)   Hemoglobin 9.1 (*) 12.0 - 15.0 (g/dL)   HCT 64.4 (*) 03.4 - 46.0 (%)   MCV 82.3  78.0 - 100.0 (fL)   MCH 25.6 (*) 26.0 - 34.0 (pg)   MCHC 31.1  30.0 - 36.0 (g/dL)   RDW 74.2 (*) 59.5 - 15.5 (%)   Platelets  134 (*) 150 - 400 (K/uL)  GLUCOSE, CAPILLARY      Component Value Range   Glucose-Capillary 100 (*) 70 - 99 (mg/dL)  GLUCOSE, CAPILLARY      Component Value Range   Glucose-Capillary 116 (*) 70 - 99 (mg/dL)   Comment 1 Notify RN    GLUCOSE, CAPILLARY      Component Value Range   Glucose-Capillary 107 (*) 70 - 99 (mg/dL)   Comment 1 Notify RN    GLUCOSE, CAPILLARY      Component Value Range   Glucose-Capillary 94  70 - 99 (mg/dL)   Comment 1 Notify RN    GLUCOSE, CAPILLARY      Component Value Range   Glucose-Capillary 121 (*) 70 - 99 (mg/dL)   Comment 1 Notify RN    BASIC METABOLIC PANEL      Component Value Range   Sodium 135  135 - 145 (mEq/L)   Potassium 3.8  3.5 - 5.1 (mEq/L)   Chloride 98  96 - 112 (mEq/L)   CO2 25  19 - 32 (mEq/L)   Glucose, Bld 115 (*) 70 - 99 (mg/dL)   BUN 22  6 - 23 (mg/dL)   Creatinine, Ser 1.02 (*) 0.50 - 1.10 (mg/dL)   Calcium 9.2  8.4 - 72.5 (mg/dL)   GFR calc non Af Amer 53 (*) >90 (mL/min)   GFR calc Af Amer 62 (*) >90 (mL/min)  GLUCOSE, CAPILLARY      Component Value Range   Glucose-Capillary 112 (*) 70 - 99 (mg/dL)   Comment 1 Notify RN    GLUCOSE, CAPILLARY      Component Value Range   Glucose-Capillary 113 (*) 70 - 99 (mg/dL)   Comment 1 Notify RN    GLUCOSE, CAPILLARY      Component Value Range   Glucose-Capillary 110 (*) 70 - 99 (mg/dL)   Comment 1 Notify RN    GLUCOSE, CAPILLARY      Component Value Range   Glucose-Capillary 113 (*) 70 - 99 (mg/dL)   Comment 1 Notify RN    GLUCOSE, CAPILLARY      Component Value Range   Glucose-Capillary 112 (*) 70 - 99 (mg/dL)   Comment 1 Notify RN    GLUCOSE, CAPILLARY      Component Value Range   Glucose-Capillary 127 (*) 70 - 99 (mg/dL)   Comment 1 Notify RN    COMPREHENSIVE METABOLIC PANEL      Component Value Range   Sodium 135  135 - 145 (mEq/L)   Potassium 3.6  3.5 - 5.1 (mEq/L)   Chloride 96  96 - 112 (mEq/L)   CO2 27  19 - 32 (mEq/L)   Glucose, Bld 120 (*) 70 - 99 (mg/dL)     BUN 29 (*) 6 - 23 (mg/dL)   Creatinine, Ser 3.66 (*) 0.50 - 1.10 (mg/dL)   Calcium 9.2  8.4 - 44.0 (mg/dL)   Total Protein 7.0  6.0 - 8.3 (g/dL)   Albumin 2.9 (*) 3.5 - 5.2 (g/dL)   AST 78 (*) 0 - 37 (U/L)   ALT 89 (*) 0 - 35 (U/L)   Alkaline Phosphatase 121 (*) 39 - 117 (U/L)   Total Bilirubin 0.6  0.3 - 1.2 (mg/dL)   GFR calc non Af Amer 54 (*) >90 (mL/min)   GFR calc Af Amer 63 (*) >90 (mL/min)  MAGNESIUM      Component Value Range   Magnesium 2.2  1.5 - 2.5 (mg/dL)  PHOSPHORUS      Component Value Range   Phosphorus 4.7 (*) 2.3 - 4.6 (mg/dL)  CHOLESTEROL, TOTAL      Component Value  Range   Cholesterol 168  0 - 200 (mg/dL)  TRIGLYCERIDES      Component Value Range   Triglycerides 141  <150 (mg/dL)  PREALBUMIN      Component Value Range   Prealbumin 11.8 (*) 17.0 - 34.0 (mg/dL)  CBC      Component Value Range   WBC 5.8  4.0 - 10.5 (K/uL)   RBC 3.74 (*) 3.87 - 5.11 (MIL/uL)   Hemoglobin 9.4 (*) 12.0 - 15.0 (g/dL)   HCT 47.8 (*) 29.5 - 46.0 (%)   MCV 82.9  78.0 - 100.0 (fL)   MCH 25.1 (*) 26.0 - 34.0 (pg)   MCHC 30.3  30.0 - 36.0 (g/dL)   RDW 62.1 (*) 30.8 - 15.5 (%)   Platelets 213  150 - 400 (K/uL)  GLUCOSE, CAPILLARY      Component Value Range   Glucose-Capillary 111 (*) 70 - 99 (mg/dL)  GLUCOSE, CAPILLARY      Component Value Range   Glucose-Capillary 126 (*) 70 - 99 (mg/dL)   Comment 1 Documented in Chart     Comment 2 Notify RN    GLUCOSE, CAPILLARY      Component Value Range   Glucose-Capillary 114 (*) 70 - 99 (mg/dL)   Comment 1 Documented in Chart     Comment 2 Notify RN    GLUCOSE, CAPILLARY      Component Value Range   Glucose-Capillary 132 (*) 70 - 99 (mg/dL)   Comment 1 Notify RN       Ionna Avis,D [employer]

## 2011-05-11 NOTE — Progress Notes (Signed)
PARENTERAL NUTRITION CONSULT NOTE - Follow-Up  Pharmacy Consult for TNA Indication: pSBO  No Known Allergies  Patient Measurements: Height: 5\' 6"  (167.6 cm) Weight: 205 lb (92.987 kg) IBW/kg (Calculated) : 59.3  Adjusted Body Weight: 69 kg  Vital Signs: Temp: 99 F (37.2 C) (04/29 0400) Temp src: Oral (04/29 0400) BP: 107/77 mmHg (04/29 0400) Pulse Rate: 94  (04/29 0400) Intake/Output from previous day: 04/28 0701 - 04/29 0700 In: 1949.2 [P.O.:120; TPN:1829.2] Out: 702 [Urine:2; Emesis/NG output:700] Intake/Output from this shift:    Labs:  Basename 05/11/11 0406 05/09/11 0630  WBC 5.8 6.0  HGB 9.4* 9.1*  HCT 31.0* 29.3*  PLT 213 134*  APTT -- --  INR -- --     Basename 05/11/11 0406 05/10/11 0406 05/09/11 0630  NA 135 135 137  K 3.6 3.8 3.8  CL 96 98 100  CO2 27 25 28   GLUCOSE 120* 115* 116*  BUN 29* 22 17  CREATININE 1.21* 1.22* 1.14*  LABCREA -- -- --  CREAT24HRUR -- -- --  CALCIUM 9.2 9.2 9.3  MG 2.2 -- 1.9  PHOS 4.7* -- 4.6  PROT 7.0 -- --  ALBUMIN 2.9* -- --  AST 78* -- --  ALT 89* -- --  ALKPHOS 121* -- --  BILITOT 0.6 -- --  BILIDIR -- -- --  IBILI -- -- --  PREALBUMIN -- -- --  TRIG 141 -- --  CHOLHDL -- -- --  CHOL 168 -- --   Estimated Creatinine Clearance: 68.9 ml/min (by C-G formula based on Cr of 1.21).    Basename 05/11/11 0735 05/11/11 0404 05/11/11 0026  GLUCAP 132* 114* 126*    Medical History: Past Medical History  Diagnosis Date  . Hypertension     SINCE FIRST PREGNANCY  . Asthma   . Constipation   . History of miscarriage   . Preeclampsia   . Postoperative ileus 07/2010  . Iron deficiency anemia     s/p transfusions [facility]  . Uterine cancer     Medications:  Scheduled:     . bisacodyl  10 mg Rectal TID  . cloNIDine  0.2 mg Transdermal Weekly  . enoxaparin  40 mg Subcutaneous Q24H  . insulin aspart  0-9 Units Subcutaneous Q4H  . lactulose  30 g Oral TID  . senna-docusate  3 tablet Oral TID   Infusions:     . TPN (CLINIMIX) +/- additives 70 mL/hr at 05/09/11 1744   And  . fat emulsion 250 mL (05/09/11 1744)  . fat emulsion 250 mL (05/10/11 1741)  . fat emulsion    . TPN (CLINIMIX) +/- additives    . DISCONTD: TPN (CLINIMIX) +/- additives    . DISCONTD: TPN (CLINIMIX) +/- additives 80 mL/hr at 05/10/11 1740    Insulin Requirements in the past 24 hours:  CBG 110-127: 1 unit CBG this am 130's: 2 units  Current Nutrition:  CL diet  Clinimix E 5/15 at 80 mL/hr Fat Emulsion 20% at 10 mL/hr Maintenance IVF held due to lack of peripheral IV access  Nutritional Goals:  Per RD: Kcal goals: 1750-2050/day, Protein 70-90g/day, Fluid 1.7-2L/day  Clinimix 5/15: goal rate 80 ml/hr (1979ml/day) kCal goals including lipid administration = 1843 kcal/day Protein goals = 96 g protein/day  Assessment:  42 yo female with past medical history of endometrial cancer admitted on 4/17 with abdominal cramping and a partial SBO. No current plans for surgical intervention, being managed conservatively; bowel rest, (NG tube out).  Considering G tube for  decompression  TNA was started 4/25 via Port-a-cath.  Electrolytes wnl except Phosphorus elevated  LFTs slightly increased  Lipids wnl (TG 141)  Prealbumin - 9.7 4/26,pending today  Change to no electrolyte formula  Plan:  Change to Clinimix 5/15 (no electrolyte formula) at 6pm  Begin to wean TNA, rate to 60 m/hr Continue Lipids daily at 10 ml/hr Due to national shortages, adding MVI on MWF only Lab: BMET, phosphorus level  Otho Bellows PharmD [phone] -05/11/2011 10:16 AM

## 2011-05-11 NOTE — Progress Notes (Signed)
  05/11/2011, 8:08 AM  Hospital day: 13 Antibiotics: none Chemotherapy: day 15 cycle 1 cisplatin/adriamycin    Subjective: NG out over weekend, too uncomfortable to replace. Intermittent vomiting which has been large volumes. Feels very full if drinks fluids and would not tolerate miralax. Is passing flatus frequently, especially after walking or with changes in position. Had small amounts of stool when tried prn dulcolax suppository. No bleeding or SOB. Some periumbilical pain for first time this am. Walking frequently in hall.  Brother here from out of state, cousin here, brother arriving from [place].  Objective: Vital signs in last 24 hours: Blood pressure 107/77, pulse 94, temperature 99 F (37.2 C), temperature source Oral, resp. rate 20, height 5\' 6"  (1.676 m), weight 205 lb (92.987 kg), last menstrual period 07/02/2010, SpO2 96.00%. Awake, alert, does not appear in distress. Moves easily in bed. Mouth clear. No complete alopecia. PERRL, not icteric. Cor RRR. Lungs without wheeze or rales. PAC site fine, TNA infusing. Abdomen more distended, quiet, slightly tender periumbilical, ventral hernia as noted. LE no edema, cords, tenderness.   Intake/Output from previous day: 04/28 0701 - 04/29 0700 In: 1949.2 [P.O.:120; TPN:1829.2] Out: 702 [Urine:2; Emesis/NG output:700] Intake/Output this shift:      Lab Results:  Basename 05/11/11 0406 05/09/11 0630  WBC 5.8 6.0  HGB 9.4* 9.1*  HCT 31.0* 29.3*  PLT 213 134*  Counts recovering from last chemo BMET  Basename 05/11/11 0406 05/10/11 0406  NA 135 135  K 3.6 3.8  CL 96 98  CO2 27 25  GLUCOSE 120* 115*  BUN 29* 22  CREATININE 1.21* 1.22*  CALCIUM 9.2 9.2    Studies/Results: No results found. KUB ordered  Assessment/Plan: 1.Recurrent endometrial carcinoma, with bowel obstruction/ partial bowel obstruction. On short term TNA. Counts recovering from recent first cycle CDDP/adriamycin. Would G tube be possible?  Surgery following, GI has not seen. 2.Severe chronic constipation from childhood: probably adding to the bowel problems to some degree. Had been managed with daily miralax previously. Will try senokotS and dulcolax suppository tid. Should we try IV reglan, or is the obstruction too significant to add that? 3.PAC in 4.multifactorial anemia, hgb better. 5.social concerns. [facility] social workers and chaplain have been assisting.    Alexsandra Shontz P

## 2011-05-11 NOTE — Progress Notes (Signed)
  Subjective: Passing flatus, still with distension, very small bm  Objective: Vital signs in last 24 hours: Temp:  [97.9 F (36.6 C)-100.3 F (37.9 C)] 99 F (37.2 C) (04/29 0400) Pulse Rate:  [94-106] 94  (04/29 0400) Resp:  [16-20] 20  (04/29 0400) BP: (107-113)/(77-79) 107/77 mmHg (04/29 0400) SpO2:  [90 %-96 %] 96 % (04/29 0400) Last BM Date: 05/11/11  Intake/Output from previous day: 04/28 0701 - 04/29 0700 In: 1949.2 [P.O.:120; TPN:1829.2] Out: 702 [Urine:2; Emesis/NG output:700] Intake/Output this shift:    GI: distended, some bs, hernia reducible, nontender  Lab Results:   Basename 05/11/11 0406 05/09/11 0630  WBC 5.8 6.0  HGB 9.4* 9.1*  HCT 31.0* 29.3*  PLT 213 134*   BMET  Basename 05/11/11 0406 05/10/11 0406  NA 135 135  K 3.6 3.8  CL 96 98  CO2 27 25  GLUCOSE 120* 115*  BUN 29* 22  CREATININE 1.21* 1.22*  CALCIUM 9.2 9.2    Assessment/Plan: Malignant bowel obstruction  She has partial obstruction.  I would continue managing conservatively and think a gastrostomy would be a good choice.     LOS: 12 days    [facility] 05/11/2011

## 2011-05-11 NOTE — H&P (View-Only) (Signed)
PROGRESS NOTE  Krystal [name] ZOX:[id number] DOB: [birth year] DOA: 04/29/2011 PCP: [name], MD, MD  Brief narrative: Krystal [name] is a 41 year old female with a past medical history of endometrial cancer who was admitted on 04/29/2011 with abdominal cramping and a partial small bowel obstruction. She is being followed by surgery with no plans for surgical intervention at present. The plan, at this point, is to have interventional radiology place a G-tube for symptom management given her recurrent problems with nausea and vomiting.  Assessment/Plan: Principal Problem:  *Partial small bowel obstruction  Surgical consultation performed by Dr. Lodema Pilot on 05/03/11.  Surgical intervention felt to be only used as a last resort.  Treated conservatively with bowel rest and NG tube decompression of the stomach.  Having intermittent bowel movements and passing flatus at this time.  NG tube out as of 05/09/2011. Refuses to have replaced at this time.  Surgery consultants recommending G-tube placement at this time.  Dr. Deanne Coffer of interventional radiology consulted for placement of the G-tube.  We'll begin to wean TNA. Active Problems:  Endometrial ca  Status post cycle 1 cisplatin/Adriamycin.  Rapidly progressive per Dr. Precious Reel notes. Neutropenia / thrombocytopenia  Related to prior chemotherapy.   Status post treatment with Neupogen, now discontinued.  No evidence of bleeding or infection.  White blood cell count now normal and platelet count currently stable. Chronic constipation  Unable to tolerate MiraLAX.  Senokot-S and Dulcolax suppositories 3 times a day ordered by oncologist.  Would defer to surgery regarding the question of the benefit of Reglan as this medication can cause bowel perforation and obstruction. Inadequate oral intake  Wean TNA.  Hypertension  Started on a clonidine patch 05/06/2011 with subsequent good blood pressure control.  Was on Norvasc  as an outpatient.  Acute renal failure/mild hydronephrosis  Creatinine 1.2 on admission  Slightly worse, likely due to discontinuation of supplemental IV fluids. Normocytic anemia  Likely from anemia of chronic disease and the sequela of chemotherapy.  Hemoglobin stable.  Code Status: Full Family Communication: Updated at bedside. Disposition Plan: Home, when stable.  Medical Consultants:  Dr. Jama Flavors, Oncology  Dr. Lodema Pilot, Surgery  Dr. Deanne Coffer, Interventional radiology  Other consultants:  Pharmacy  Antibiotics:  None   Subjective  Krystal [name] has been unable to tolerate clear liquids and has had large volume emesis over the past 24 hours. She has also had some periumbilical abdominal pain. Continues to report having flatus and occasional small stools.   Objective    Interim History: Stable overnight.   Objective: Filed Vitals:   05/09/11 1954 05/10/11 1300 05/10/11 2333 05/11/11 0400  BP: 114/78 113/79 108/78 107/77  Pulse: 103 104 106 94  Temp: 99.3 F (37.4 C) 97.9 F (36.6 C) 100.3 F (37.9 C) 99 F (37.2 C)  TempSrc: Oral  Oral Oral  Resp: 18 20 16 20   Height:      Weight:      SpO2: 97% 90% 94% 96%    Intake/Output Summary (Last 24 hours) at 05/11/11 0947 Last data filed at 05/11/11 0544  Gross per 24 hour  Intake 1949.16 ml  Output    702 ml  Net 1247.16 ml    Exam: Gen:  NAD Cardiovascular:  RRR, No M/R/G Respiratory: Lungs CTAB Gastrointestinal: Abdomen very distended with sluggish bowel sounds. Extremities: No C/E/C    Data Reviewed: Basic Metabolic Panel:  Lab 05/11/11 8119 05/10/11 0406 05/09/11 0630 05/08/11 0415 05/07/11 0500 05/04/11 1055  NA 135 135 137  138 139 --  K 3.6 3.8 -- -- -- --  CL 96 98 100 102 104 --  CO2 27 25 28 28 27  --  GLUCOSE 120* 115* 116* 105* 94 --  BUN 29* 22 17 9 7  --  CREATININE 1.21* 1.22* 1.14* 1.18* 1.03 --  CALCIUM 9.2 9.2 9.3 9.1 9.1 --  MG 2.2 -- 1.9 1.8 1.7 1.7  PHOS  4.7* -- 4.6 4.4 3.5 --   GFR Estimated Creatinine Clearance: 68.9 ml/min (by C-G formula based on Cr of 1.21). Liver Function Tests:  Lab 05/11/11 0406 05/08/11 0415 05/07/11 0500  AST 78* 35 25  ALT 89* 34 20  ALKPHOS 121* 106 96  BILITOT 0.6 0.6 0.7  PROT 7.0 6.9 6.7  ALBUMIN 2.9* 2.9* 2.8*    CBC:  Lab 05/11/11 0406 05/09/11 0630 05/08/11 0415 05/07/11 0500 05/06/11 0500 05/04/11 1055  WBC 5.8 6.0 2.2* 1.3* 1.2* --  NEUTROABS -- -- 1.3* 0.5* 0.6* 1.3*  HGB 9.4* 9.1* 8.7* 8.3* 8.4* --  HCT 31.0* 29.3* 28.3* 27.0* 27.2* --  MCV 82.9 82.3 83.0 82.3 81.7 --  PLT 213 134* 115* 116* 132* --   Microbiology Recent Results (from the past 240 hour(s))  URINE CULTURE     Status: Normal   Collection Time   05/03/11 10:27 PM      Component Value Range Status Comment   Specimen Description URINE, RANDOM   Final    Special Requests NONE   Final    Culture  Setup Time [id number]   Final    Colony Count 25,000 COLONIES/ML   Final    Culture     Final    Value: Multiple bacterial morphotypes present, none predominant. Suggest appropriate recollection if clinically indicated.   Report Status 05/05/2011 FINAL   Final   CULTURE, BLOOD (ROUTINE X 2)     Status: Normal   Collection Time   05/03/11 11:10 PM      Component Value Range Status Comment   Specimen Description BLOOD RIGHT [facility]   Final    Special Requests BOTTLES DRAWN AEROBIC AND ANAEROBIC 5CC   Final    Culture  Setup Time [id number]   Final    Culture NO GROWTH 5 DAYS   Final    Report Status 05/10/2011 FINAL   Final     Procedures and Diagnostic Studies:  Dg Abd 1 View 05/06/2011 IMPRESSION: Worsening dilatation of the small intestine consistent with worsening partial small bowel obstruction.  Original Report Authenticated By: Thomasenia Sales, M.D.    Dg Abd 1 View 05/03/2011 IMPRESSION:  1.  Persistent retained contrast.  Last contrast exam 04/29/2011. 2.  Persistent dilatation of small bowel loops, consistent with at  least partial small bowel obstruction.  Original Report Authenticated By: Patterson Hammersmith, M.D.    Dg Abd 1 View 05/01/2011 IMPRESSION: Recurrent small bowel dilatation as demonstrated on recent CT and concerning for partial small-bowel obstruction, possibly on the basis of adhesions or peritoneal carcinomatosis.  Original Report Authenticated By: Gerrianne Scale, M.D.    Abd 1 View (kub) 04/30/2011  IMPRESSION: Normal stool and bowel gas pattern.  Original Report Authenticated By: Brandon Melnick, M.D.    Ct Abdomen Pelvis W Contrast 04/29/2011 IMPRESSION: Dilated loops of small bowel in the left upper abdomen, compatible with at least early/partial small bowel obstruction, possibly on the basis of adhesions.  Additional suspected focal transition in the left mid abdomen.  Interval development of moderate abdominopelvic ascites with  peritoneal nodularity, suggesting peritoneal disease.  Gastrohepatic and retroperitoneal nodal metastases, as described above.  Diminished enhancement of the left kidney relative to the right with mild left hydronephrosis, possibly on the basis of extrinsic compression on the mid/lower ureter by peritoneal disease.  Midline lower pelvic ventral hernia containing a loop of small bowel. Original Report Authenticated By: Charline Bills, M.D.     Dg Chest Port 1 View 05/03/2011  IMPRESSION: Extremely low lung volumes with bilateral lower lobe atelectasis and possible retrocardiac opacity.  PA and lateral chest radiograph at full inspiration are recommended when the patient is clinically able, for better evaluation.  Original Report Authenticated By: Harrel Lemon, M.D.    Dg Abd Acute W/chest 05/04/2011 IMPRESSION: Bowel gas pattern suggests residual partial small bowel obstruction.  Original Report Authenticated By: Reyes Ivan, M.D.    Scheduled Meds:    . bisacodyl  10 mg Rectal TID  . cloNIDine  0.2 mg Transdermal Weekly  . enoxaparin  40 mg  Subcutaneous Q24H  . insulin aspart  0-9 Units Subcutaneous Q4H  . lactulose  30 g Oral TID  . senna-docusate  3 tablet Oral TID   Continuous Infusions:    . TPN (CLINIMIX) +/- additives 70 mL/hr at 05/09/11 1744   And  . fat emulsion 250 mL (05/09/11 1744)  . fat emulsion 250 mL (05/10/11 1741)  . fat emulsion    . DISCONTD: TPN (CLINIMIX) +/- additives    . DISCONTD: TPN [facility]) +/- additives 80 mL/hr at 05/10/11 1740      LOS: 12 days   Hillery Aldo, MD Pager [phone]  05/11/2011, 9:47 AM  Patient teaching/information given to the patient and reviewed with the patient:  DACY ENRICO 05/11/2011  Treatment team:  Dr. Hillery Aldo, Hospitalist (Internist)  Dr. Jama Flavors, Oncologist  Dr. Lodema Pilot (and partners), Surgeon  Dr. Deanne Coffer, Interventional radiologist  Medical Issues and plan: Principal Problem:  *Partial blockage of the bowel  Surgical consultation performed by Dr. Lodema Pilot on 05/03/11.  Surgical intervention felt to be only used as a last resort.  Treated conservatively with bowel rest and NG tube decompression of the stomach.  Having bowel movements and passing gas is a positive sign.  Another option for decompressing her stomach is to place a G-tube into your stomach through the skin. This will be placed by an interventional radiologist. The interventional radiologists PA will come and talk with you today about this procedure..  Try sips of clear liquids, as tolerated. Active Problems:  Endometrial cancer  Status post chemotherapy.  Further treatment per Dr. Darrold Span. Low white cells and platelets  Related to prior chemotherapy. Low white cells can put you at risk for infection and low platelets can cause problems with bleeding.  Neupogen discontinued because your white blood cell count is now normal.   No evidence of bleeding. Chronic constipation  Senokot S. and Dulcolax suppositories have been  ordered. Inadequate oral intake  TNA. Started. This is a temporary treatment for malnourishment.  We will begin to taper this to see if this improves her GI function. High blood pressure  Started on a clonidine patch 05/06/2011 with subsequent good blood pressure control.  Was on Norvasc as an outpatient, but this is on hold since you have an NG tube in. Mild dehydration  Continue IV fluids. Low red cells  Likely from anemia of chronic disease and the sequela of chemotherapy.  Hemoglobin stable.  Anticipated discharge date: Depends on progress.

## 2011-05-11 NOTE — Progress Notes (Signed)
On call MD notified that patient was unable to tolerate barium PO.

## 2011-05-11 NOTE — Progress Notes (Signed)
Barium sulfate given to patient to drink.  Informed patient to let RN now if unable to tolerate barium.  She verbalized understanding.

## 2011-05-12 ENCOUNTER — Inpatient Hospital Stay (HOSPITAL_COMMUNITY): Payer: Medicaid Other

## 2011-05-12 ENCOUNTER — Other Ambulatory Visit (HOSPITAL_COMMUNITY): Payer: Self-pay

## 2011-05-12 DIAGNOSIS — R180 Malignant ascites: Secondary | ICD-10-CM | POA: Diagnosis present

## 2011-05-12 HISTORY — PX: PARACENTESIS: SHX844

## 2011-05-12 LAB — CBC
HCT: 30.5 % — ABNORMAL LOW (ref 36.0–46.0)
Hemoglobin: 9.5 g/dL — ABNORMAL LOW (ref 12.0–15.0)
MCH: 25.8 pg — ABNORMAL LOW (ref 26.0–34.0)
MCHC: 31.1 g/dL (ref 30.0–36.0)

## 2011-05-12 LAB — GLUCOSE, CAPILLARY
Glucose-Capillary: 122 mg/dL — ABNORMAL HIGH (ref 70–99)
Glucose-Capillary: 116 mg/dL — ABNORMAL HIGH (ref 70–99)
Glucose-Capillary: 107 mg/dL — ABNORMAL HIGH (ref 70–99)
Glucose-Capillary: 120 mg/dL — ABNORMAL HIGH (ref 70–99)
Glucose-Capillary: 96 mg/dL (ref 70–99)
Glucose-Capillary: 125 mg/dL — ABNORMAL HIGH (ref 70–99)

## 2011-05-12 LAB — BASIC METABOLIC PANEL
BUN: 35 mg/dL — ABNORMAL HIGH (ref 6–23)
CO2: 25 mEq/L (ref 19–32)
Chloride: 92 mEq/L — ABNORMAL LOW (ref 96–112)
Creatinine, Ser: 1.38 mg/dL — ABNORMAL HIGH (ref 0.50–1.10)

## 2011-05-12 MED ORDER — PROMETHAZINE HCL 25 MG/ML IJ SOLN
12.5000 mg | Freq: Once | INTRAMUSCULAR | Status: AC
  Administered 2011-05-12: 12.5 mg via INTRAVENOUS
  Filled 2011-05-12: qty 1

## 2011-05-12 MED ORDER — PIPERACILLIN-TAZOBACTAM 3.375 G IVPB
3.3750 g | Freq: Three times a day (TID) | INTRAVENOUS | Status: DC
  Administered 2011-05-12 – 2011-05-14 (×7): 3.375 g via INTRAVENOUS
  Administered 2011-05-15: 10:00:00 via INTRAVENOUS
  Administered 2011-05-15 – 2011-05-18 (×10): 3.375 g via INTRAVENOUS
  Filled 2011-05-12 (×19): qty 50

## 2011-05-12 MED ORDER — FAT EMULSION 20 % IV EMUL
250.0000 mL | INTRAVENOUS | Status: AC
  Administered 2011-05-12: 250 mL via INTRAVENOUS
  Filled 2011-05-12: qty 250

## 2011-05-12 MED ORDER — FENTANYL CITRATE 0.05 MG/ML IJ SOLN
INTRAMUSCULAR | Status: AC
  Filled 2011-05-12: qty 6

## 2011-05-12 MED ORDER — CEFAZOLIN SODIUM-DEXTROSE 2-3 GM-% IV SOLR
INTRAVENOUS | Status: AC
  Filled 2011-05-12: qty 50

## 2011-05-12 MED ORDER — MIDAZOLAM HCL 2 MG/2ML IJ SOLN
INTRAMUSCULAR | Status: AC
  Filled 2011-05-12: qty 6

## 2011-05-12 MED ORDER — LIDOCAINE HCL 2 % EX GEL
Freq: Once | CUTANEOUS | Status: AC
  Filled 2011-05-12: qty 5

## 2011-05-12 MED ORDER — CLINIMIX/DEXTROSE (5/15) 5 % IV SOLN
INTRAVENOUS | Status: AC
  Administered 2011-05-12: 18:00:00 via INTRAVENOUS
  Filled 2011-05-12: qty 2000

## 2011-05-12 NOTE — Progress Notes (Signed)
PARENTERAL NUTRITION CONSULT NOTE - Follow-Up  Pharmacy Consult for TNA Indication: pSBO  No Known Allergies  Patient Measurements: Height: 5\' 6"  (167.6 cm) Weight: 205 lb (92.987 kg) IBW/kg (Calculated) : 59.3  Adjusted Body Weight: 69 kg  Vital Signs: Temp: 98.3 F (36.8 C) (04/30 0933) Temp src: Oral (04/30 0933) BP: 117/82 mmHg (04/30 0933) Pulse Rate: 104  (04/30 0933) Intake/Output from previous day: 04/29 0701 - 04/30 0700 In: 1907 [P.O.:120; TPN:1787] Out: 704 [Urine:4; Emesis/NG output:700] Intake/Output from this shift:    Labs:  Basename 05/12/11 0406 05/11/11 0406  WBC 5.6 5.8  HGB 9.5* 9.4*  HCT 30.5* 31.0*  PLT 267 213  APTT 29 --  INR 0.93 --     Basename 05/12/11 0406 05/11/11 0406 05/10/11 0406  NA 132* 135 135  K 3.6 3.6 3.8  CL 92* 96 98  CO2 25 27 25   GLUCOSE 125* 120* 115*  BUN 35* 29* 22  CREATININE 1.38* 1.21* 1.22*  LABCREA -- -- --  CREAT24HRUR -- -- --  CALCIUM 9.3 9.2 9.2  MG -- 2.2 --  PHOS 4.7* 4.7* --  PROT -- 7.0 --  ALBUMIN -- 2.9* --  AST -- 78* --  ALT -- 89* --  ALKPHOS -- 121* --  BILITOT -- 0.6 --  BILIDIR -- -- --  IBILI -- -- --  PREALBUMIN -- 11.8* --  TRIG -- 141 --  CHOLHDL -- -- --  CHOL -- 168 --   Estimated Creatinine Clearance: 60.4 ml/min (by C-G formula based on Cr of 1.38).    Basename 05/12/11 1145 05/12/11 0901 05/12/11 0744  GLUCAP 120* 107* 116*    Medical History: Past Medical History  Diagnosis Date  . Hypertension     SINCE FIRST PREGNANCY  . Asthma   . Constipation   . History of miscarriage   . Preeclampsia   . Postoperative ileus 07/2010  . Iron deficiency anemia     s/p transfusions [facility]  . Uterine cancer     Medications:  Scheduled:     . bisacodyl  10 mg Rectal TID  . ceFAZolin      .  ceFAZolin (ANCEF) IV  2 g Intravenous On Call  . cloNIDine  0.2 mg Transdermal Weekly  . enoxaparin  40 mg Subcutaneous Q24H  . insulin aspart  0-9 Units Subcutaneous Q4H  .  lactulose  30 g Oral TID  . lidocaine   Topical Once  . promethazine  12.5 mg Intravenous Once  . promethazine  12.5 mg Intravenous Once  . senna-docusate  3 tablet Oral TID   Infusions:     . fat emulsion 250 mL (05/11/11 1748)  . fat emulsion 250 mL (05/12/11 0500)  . TPN (CLINIMIX) +/- additives 60 mL/hr at 05/12/11 0500    Insulin Requirements in the past 24 hours:  CBG 102-132: 4 unit  Current Nutrition:  NPO for G tube placement/NG tube for retained barium in stomach. Clinimix 5/15 at 60 mL/hr thru [facility] Fat Emulsion 20% at 10 mL/hr Maintenance IVF held due to lack of peripheral IV access, IV team attempting peripheral access.  Nutritional Goals:  Per RD: Kcal goals: 1750-2050/day, Protein 70-90g/day, Fluid 1.7-2L/day  Clinimix 5/15: goal rate 80 ml/hr (1937ml/day) kCal goals including lipid administration = 1843 kcal/day Protein goals = 96 g protein/day  Assessment:  41 yo female with past medical history of endometrial cancer admitted on 4/17 with abdominal cramping and a partial SBO. No current plans for surgical intervention,  being managed conservatively; bowel rest, NG tube replaced. Plan G tube for decompression  TNA was started 4/25 via Port-a-cath.  Electrolytes wnl except Phosphorus remains elevated  LFTs slightly increased  Lipids wnl (TG 141)  Prealbumin - 9.7 4/26, 11.8 4/29  Change to no electrolyte formula  Plan:  Change to Clinimix 5/15 (no electrolyte formula) at 6pm  unable to wean TNA, increase rate back to goal 80 m/hr Continue Lipids daily at 10 ml/hr Due to national shortages, adding MVI on MWF only Lab: BMET (plan phos level on Thursday)  Otho Bellows PharmD [phone] -05/12/2011 12:14 PM

## 2011-05-12 NOTE — Progress Notes (Signed)
Patient still with some green emesis despite NG tube to intermittent suction.  NG tube advanced several inches.  Air bolus auscultated over fundus by 2 RN's, however still no return of drainage.  Dr. Darnelle Catalan notified to order portable abdominal x-ray to confirm placement.  [name] [facility]  05/12/2011

## 2011-05-12 NOTE — Progress Notes (Signed)
Patient ID: Krystal [name], female   DOB: 07/31/1970, 41 y.o.   MRN: [id number] Patient evaluated for gastrostomy tube placement in IR.  Ultrasound demonstrated a large amount of ascites and fluoroscopy demonstrated multiple dilated loops of bowel consistent with bowel obstruction.  In addition, there is still barium within the stomach which is not ideal for gastrostomy tube placement.  Therefore, a paracentesis was performed and 2 liters of fluid was removed.  A 14 Fr.  nasogastric tube was placed with fluoroscopy and will try to decompress bowel loops and remove gastric barium overnight.  Plan for gastrostomy tube tomorrow.

## 2011-05-12 NOTE — Plan of Care (Signed)
Problem: Inadequate Intake (NI-2.1) Goal: Food and/or nutrient delivery Individualized approach for food/nutrient provision.  Outcome: Progressing Not yet meeting > 90% of estimated energy needs. TNA is increasing to goal rate to meet estimated energy needs.

## 2011-05-12 NOTE — Progress Notes (Signed)
Patient assisted to the bathroom by nurse tech to take a bath.  Patient began vomiting and had a syncopal episode.  Patient was lowered to the floor.  Patient did not appear to fully lose consciousness but was slow to respond.  Patient pale and diaphoretic.  Vital signs obtained: b/p 102/69, pulse 101, resp 16, o2 sat 96% ra.  Patient did become more responsive and was assisted back to bed by nurse tech and nurse.  Vital signs obtained after returning to bed: b/p 114/83, pulse 105, resp 14, o2 sat 94% and temp. 100.2.  Dr. Darnelle Catalan notified and order given.  [name] [facility]  05/12/2011  6:00 PM

## 2011-05-12 NOTE — Progress Notes (Signed)
Nutrition Follow-up  Diet Order:  NPO  Patient is currently receiving Clinimix 5/15 @ 60 ml/hr advancing to goal rate of 80 ml/hr with lipids 20% 10 ml/hr. Per RN patient continues to have abdominal discomfort. Noted patient with elevated Phos. Patient went for paracentesis today where 2L of fluid was removed.     Meds: Scheduled Meds:   . bisacodyl  10 mg Rectal TID  . ceFAZolin      .  ceFAZolin (ANCEF) IV  2 g Intravenous On Call  . cloNIDine  0.2 mg Transdermal Weekly  . enoxaparin  40 mg Subcutaneous Q24H  . insulin aspart  0-9 Units Subcutaneous Q4H  . lactulose  30 g Oral TID  . lidocaine   Topical Once  . promethazine  12.5 mg Intravenous Once  . promethazine  12.5 mg Intravenous Once  . senna-docusate  3 tablet Oral TID   Continuous Infusions:   . fat emulsion 250 mL (05/11/11 1748)  . fat emulsion 250 mL (05/12/11 0500)  . TPN (CLINIMIX) +/- additives     And  . fat emulsion    . TPN (CLINIMIX) +/- additives 60 mL/hr at 05/12/11 0500   PRN Meds:.acetaminophen, albuterol, guaiFENesin-dextromethorphan, HYDROmorphone (DILAUDID) injection, HYDROmorphone (DILAUDID) injection, ipratropium, LORazepam, ondansetron (ZOFRAN) IV, ondansetron, oxyCODONE, phenol  Labs:  CMP     Component Value Date/Time   NA 132* 05/12/2011 0406   K 3.6 05/12/2011 0406   CL 92* 05/12/2011 0406   CO2 25 05/12/2011 0406   GLUCOSE 125* 05/12/2011 0406   BUN 35* 05/12/2011 0406   CREATININE 1.38* 05/12/2011 0406   CREATININE 0.55 10/17/2009 1300   CALCIUM 9.3 05/12/2011 0406   PROT 7.0 05/11/2011 0406   ALBUMIN 2.9* 05/11/2011 0406   AST 78* 05/11/2011 0406   ALT 89* 05/11/2011 0406   ALKPHOS 121* 05/11/2011 0406   BILITOT 0.6 05/11/2011 0406   GFRNONAA 46* 05/12/2011 0406   GFRAA 53* 05/12/2011 0406     Intake/Output Summary (Last 24 hours) at 05/12/11 1503 Last data filed at 05/12/11 0700  Gross per 24 hour  Intake   1787 ml  Output    701 ml  Net   1086 ml    Weight Status:  No new  weight.  Re-estimated needs: No changes.  1750-2050 calories  70-90g protein   Nutrition Dx:  Inadequate Oral intake - Ongoing  Goal:  TNA to meet > 90% of estimated energy needs. -Not yet meeting.  Intervention: 1. TNA per pharmacy. 2. Recommend re-weigh patient. 3. Current TNA of Clinimix 5/15 @ 60 ml/hr with lipids 20% at 10 ml/hr provides 1502 kcal (85.8% of estimated kcal needs) and 72 grams of AA (meets 100% of estimated protein needs). Per pharmacy TNA advancing to goal rate of 80 ml/hr to meet 100% of estimated energy needs.   Monitor:  Weight trends, labs, I/O's BM, diet advancement, nausea, vomiting.    Iven Finn [facility] Pager #:  [phone]

## 2011-05-12 NOTE — Progress Notes (Signed)
ANTIBIOTIC CONSULT NOTE - INITIAL  Pharmacy Consult for Zosyn Indication: Fever, unknown site of infection  No Known Allergies  Patient Measurements: Height: 5\' 6"  (167.6 cm) Weight: 205 lb (92.987 kg) IBW/kg (Calculated) : 59.3   Vital Signs: Temp: 97.6 F (36.4 C) (04/30 1552) Temp src: Oral (04/30 1552) BP: 118/86 mmHg (04/30 1418) Pulse Rate: 108  (04/30 1418) Intake/Output from previous day: 04/29 0701 - 04/30 0700 In: 1907 [P.O.:120; TPN:1787] Out: 704 [Urine:4; Emesis/NG output:700] Intake/Output from this shift: Total I/O In: -  Out: 2000 [Other:2000]  Labs:  [facility] 05/12/11 0406 05/11/11 0406 05/10/11 0406  WBC 5.6 5.8 --  HGB 9.5* 9.4* --  PLT 267 213 --  LABCREA -- -- --  CREATININE 1.38* 1.21* 1.22*   Estimated Creatinine Clearance: 60.4 ml/min (by C-G formula based on Cr of 1.38).  Microbiology: Recent Results (from the past 720 hour(s))  URINE CULTURE     Status: Normal   Collection Time   04/29/11 10:17 PM      Component Value Range Status Comment   Specimen Description URINE, CLEAN CATCH   Final    Special Requests none   Final    Culture  Setup Time [id number]   Final    Colony Count NO GROWTH   Final    Culture NO GROWTH   Final    Report Status 04/30/2011 FINAL   Final   URINE CULTURE     Status: Normal   Collection Time   05/03/11 10:27 PM      Component Value Range Status Comment   Specimen Description URINE, RANDOM   Final    Special Requests NONE   Final    Culture  Setup Time [id number]   Final    Colony Count 25,000 COLONIES/ML   Final    Culture     Final    Value: Multiple bacterial morphotypes present, none predominant. Suggest appropriate recollection if clinically indicated.   Report Status 05/05/2011 FINAL   Final   CULTURE, BLOOD (ROUTINE X 2)     Status: Normal   Collection Time   05/03/11 11:10 PM      Component Value Range Status Comment   Specimen Description BLOOD RIGHT [facility]   Final    Special Requests BOTTLES  DRAWN AEROBIC AND ANAEROBIC 5CC   Final    Culture  Setup Time [id number]   Final    Culture NO GROWTH 5 DAYS   Final    Report Status 05/10/2011 FINAL   Final     Medical History: Past Medical History  Diagnosis Date  . Hypertension     SINCE FIRST PREGNANCY  . Asthma   . Constipation   . History of miscarriage   . Preeclampsia   . Postoperative ileus 07/2010  . Iron deficiency anemia     s/p transfusions [facility]  . Uterine cancer     Assessment: 41 YOF to begin Zosyn empirically for spiked temperature of 100.2 in setting of endometrial CA and bowel obstruction.  CrCl~60 ml/min.  Goal of Therapy:  dose adjusted per renal clearance  Plan:  Zosyn 3.375g IV q8h (4 hour infusion time). F/u SCr.  Clance Boll 05/12/2011,6:20 PM

## 2011-05-12 NOTE — Progress Notes (Signed)
Patient currently laying in bed and without complaints.  Patient appears less pale and is no longer diaphoretic.  Will continue to monitor.  [name] [facility]  05/12/2011

## 2011-05-12 NOTE — Procedures (Signed)
Ultrasound guided paracentesis.  Removed 2 liters of yellow-green ascites.  No immediate complication.

## 2011-05-12 NOTE — Progress Notes (Signed)
Dr. Darnelle Catalan requested that this RN call oncologist on-call regarding discussing patient's code status.  Dr. Welton Flakes on call and notified.  She will defer this discussion to Dr. Darrold Span for 05/13/11.  [name] [facility]  05/12/2011

## 2011-05-12 NOTE — Progress Notes (Signed)
PROGRESS NOTE  Krystal [name] FBP:[id number] DOB: Nov 09, 1970 DOA: 04/29/2011 PCP: [name], MD, MD  Brief narrative: Krystal [name] is a 41 year old female with a past medical history of endometrial cancer who was admitted on 04/29/2011 with abdominal cramping and a partial small bowel obstruction. She is being followed by surgery with no plans for surgical intervention at present. The plan, at this point, is to have interventional radiology place a G-tube for symptom management given her recurrent problems with nausea and vomiting.  Assessment/Plan: Principal Problem:  *Partial small bowel obstruction  Surgical consultation performed by Dr. Lodema Pilot on 05/03/11.  Surgical intervention felt to be only used as a last resort.  Treated conservatively with bowel rest and NG tube decompression of the stomach.  Having intermittent bowel movements and passing flatus at this time.  NG tube out as of 05/09/2011. Replaced by interventional radiology on 05/12/2011.  Surgery consultants recommending G-tube placement at this time.  Dr. Deanne Coffer of interventional radiology consulted for placement of the G-tube. Attempted to place on 05/12/2011 but not successful due to retained barium in the stomach. Plan is to proceed with placement tomorrow.  TNA started for temporary nutritional purposes. Unable to wean yet. Active Problems:  Endometrial ca / Ascites  Status post cycle 1 cisplatin/Adriamycin.  Rapidly progressive per Dr. Precious Reel notes.  Paracentesis done on 05/12/2011. Neutropenia / thrombocytopenia  Related to prior chemotherapy.   Status post treatment with Neupogen, now discontinued.  No evidence of bleeding infection.  White blood cell count now normal and platelet count currently stable. Chronic constipation  Unable to tolerate MiraLAX.  Senokot-S and Dulcolax suppositories 3 times a day ordered by oncologist.  Would defer to surgery regarding the question of the benefit  of Reglan as this medication can cause bowel perforation and obstruction. Inadequate oral intake  Wean TNA and by mouth intake improved.  Hypertension  Started on a clonidine patch 05/06/2011 with subsequent good blood pressure control.  Was on Norvasc as an outpatient.  Acute renal failure/mild hydronephrosis  Creatinine 1.2 on admission  Slightly worse, likely due to discontinuation of supplemental IV fluids. Normocytic anemia  Likely from anemia of chronic disease and the sequela of chemotherapy.  Hemoglobin stable.  Code Status: Full Family Communication: Updated at bedside. Disposition Plan: Home, when stable.  Medical Consultants:  Dr. Jama Flavors, Oncology  Dr. Lodema Pilot, Surgery  Dr. Deanne Coffer, Interventional radiology  Other consultants:  Pharmacy  Antibiotics:  None   Subjective  Krystal [name] now has an NG tube in place. Despite this, she has bilious vomiting. She reports that her abdomen feels less distended. She continues to report passing flatus and had a small bowel movement this morning.   Objective    Interim History: Stable overnight.   Objective: Filed Vitals:   05/12/11 0901 05/12/11 0933 05/12/11 1418 05/12/11 1552  BP: 125/94 117/82 118/86   Pulse: 101 104 108   Temp:  98.3 F (36.8 C) 98.7 F (37.1 C) 97.6 F (36.4 C)  TempSrc:  Oral Oral Oral  Resp: 14 16 16    Height:      Weight:      SpO2: 94% 97% 96%     Intake/Output Summary (Last 24 hours) at 05/12/11 1624 Last data filed at 05/12/11 0700  Gross per 24 hour  Intake    929 ml  Output    701 ml  Net    228 ml    Exam: Gen:  NAD Cardiovascular:  RRR, No M/R/G Respiratory:  Lungs CTAB Gastrointestinal: Abdomen very distended with sluggish bowel sounds. Extremities: No C/E/C    Data Reviewed: Basic Metabolic Panel:  Lab 05/12/11 1610 05/11/11 0406 05/10/11 0406 05/09/11 0630 05/08/11 0415 05/07/11 0500  NA 132* 135 135 137 138 --  K 3.6 3.6 -- -- -- --  CL  92* 96 98 100 102 --  CO2 25 27 25 28 28  --  GLUCOSE 125* 120* 115* 116* 105* --  BUN 35* 29* 22 17 9  --  CREATININE 1.38* 1.21* 1.22* 1.14* 1.18* --  CALCIUM 9.3 9.2 9.2 9.3 9.1 --  MG -- 2.2 -- 1.9 1.8 1.7  PHOS 4.7* 4.7* -- 4.6 4.4 3.5   GFR Estimated Creatinine Clearance: 60.4 ml/min (by C-G formula based on Cr of 1.38). Liver Function Tests:  Lab 05/11/11 0406 05/08/11 0415 05/07/11 0500  AST 78* 35 25  ALT 89* 34 20  ALKPHOS 121* 106 96  BILITOT 0.6 0.6 0.7  PROT 7.0 6.9 6.7  ALBUMIN 2.9* 2.9* 2.8*    CBC:  Lab 05/12/11 0406 05/11/11 0406 05/09/11 0630 05/08/11 0415 05/07/11 0500 05/06/11 0500  WBC 5.6 5.8 6.0 2.2* 1.3* --  NEUTROABS -- -- -- 1.3* 0.5* 0.6*  HGB 9.5* 9.4* 9.1* 8.7* 8.3* --  HCT 30.5* 31.0* 29.3* 28.3* 27.0* --  MCV 82.9 82.9 82.3 83.0 82.3 --  PLT 267 213 134* 115* 116* --   Microbiology Recent Results (from the past 240 hour(s))  URINE CULTURE     Status: Normal   Collection Time   05/03/11 10:27 PM      Component Value Range Status Comment   Specimen Description URINE, RANDOM   Final    Special Requests NONE   Final    Culture  Setup Time [id number]   Final    Colony Count 25,000 COLONIES/ML   Final    Culture     Final    Value: Multiple bacterial morphotypes present, none predominant. Suggest appropriate recollection if clinically indicated.   Report Status 05/05/2011 FINAL   Final   CULTURE, BLOOD (ROUTINE X 2)     Status: Normal   Collection Time   05/03/11 11:10 PM      Component Value Range Status Comment   Specimen Description BLOOD RIGHT [facility]   Final    Special Requests BOTTLES DRAWN AEROBIC AND ANAEROBIC 5CC   Final    Culture  Setup Time [id number]   Final    Culture NO GROWTH 5 DAYS   Final    Report Status 05/10/2011 FINAL   Final     Procedures and Diagnostic Studies:  Dg Abd 1 View 05/06/2011 IMPRESSION: Worsening dilatation of the small intestine consistent with worsening partial small bowel obstruction.  Original  Report Authenticated By: Thomasenia Sales, M.D.    Dg Abd 1 View 05/03/2011 IMPRESSION:  1.  Persistent retained contrast.  Last contrast exam 04/29/2011. 2.  Persistent dilatation of small bowel loops, consistent with at least partial small bowel obstruction.  Original Report Authenticated By: Patterson Hammersmith, M.D.    Dg Abd 1 View 05/01/2011 IMPRESSION: Recurrent small bowel dilatation as demonstrated on recent CT and concerning for partial small-bowel obstruction, possibly on the basis of adhesions or peritoneal carcinomatosis.  Original Report Authenticated By: Gerrianne Scale, M.D.    Abd 1 View (kub) 04/30/2011  IMPRESSION: Normal stool and bowel gas pattern.  Original Report Authenticated By: Brandon Melnick, M.D.    Ct Abdomen Pelvis W Contrast 04/29/2011 IMPRESSION: Dilated loops  of small bowel in the left upper abdomen, compatible with at least early/partial small bowel obstruction, possibly on the basis of adhesions.  Additional suspected focal transition in the left mid abdomen.  Interval development of moderate abdominopelvic ascites with peritoneal nodularity, suggesting peritoneal disease.  Gastrohepatic and retroperitoneal nodal metastases, as described above.  Diminished enhancement of the left kidney relative to the right with mild left hydronephrosis, possibly on the basis of extrinsic compression on the mid/lower ureter by peritoneal disease.  Midline lower pelvic ventral hernia containing a loop of small bowel. Original Report Authenticated By: Charline Bills, M.D.     Dg Chest Port 1 View 05/03/2011  IMPRESSION: Extremely low lung volumes with bilateral lower lobe atelectasis and possible retrocardiac opacity.  PA and lateral chest radiograph at full inspiration are recommended when the patient is clinically able, for better evaluation.  Original Report Authenticated By: Harrel Lemon, M.D.    Dg Abd Acute W/chest 05/04/2011 IMPRESSION: Bowel gas pattern suggests residual  partial small bowel obstruction.  Original Report Authenticated By: Reyes Ivan, M.D.    Scheduled Meds:    . bisacodyl  10 mg Rectal TID  . ceFAZolin      .  ceFAZolin (ANCEF) IV  2 g Intravenous On Call  . cloNIDine  0.2 mg Transdermal Weekly  . enoxaparin  40 mg Subcutaneous Q24H  . insulin aspart  0-9 Units Subcutaneous Q4H  . lactulose  30 g Oral TID  . lidocaine   Topical Once  . promethazine  12.5 mg Intravenous Once  . promethazine  12.5 mg Intravenous Once  . senna-docusate  3 tablet Oral TID   Continuous Infusions:    . fat emulsion 250 mL (05/11/11 1748)  . fat emulsion 250 mL (05/12/11 0500)  . TPN (CLINIMIX) +/- additives     And  . fat emulsion    . TPN [facility]) +/- additives 60 mL/hr at 05/12/11 0500      LOS: 13 days   Hillery Aldo, MD Pager [phone]  05/12/2011, 4:24 PM  Patient teaching/information given to the patient and reviewed with the patient:  Krystal [name] 05/12/2011  Treatment team:  Dr. Hillery Aldo, Hospitalist (Internist)  Dr. Jama Flavors, Oncologist  Dr. Lodema Pilot (and partners), Surgeon  Dr. Deanne Coffer, Interventional radiologist  Medical Issues and plan: Principal Problem:  *Partial blockage of the bowel  Surgical consultation performed by Dr. Lodema Pilot on 05/03/11.  Surgical intervention felt to be only used as a last resort.  Treated conservatively with bowel rest and NG tube decompression of the stomach.  Having bowel movements and passing gas is a positive sign.  G-tube placement planned for tomorrow.  Try sips of clear liquids, as tolerated. Active Problems:  Endometrial cancer  Status post chemotherapy.  Further treatment per Dr. Darrold Span.  Fluid was taken off of your belly on 05/12/2011 to help with bloating. Low white cells and platelets  Related to prior chemotherapy. Low white cells can put you at risk for infection and low platelets can cause problems with  bleeding.  Neupogen discontinued because your white blood cell count is now normal.   No evidence of bleeding. Chronic constipation  Senokot S. and Dulcolax suppositories have been ordered. Inadequate oral intake  TNA. Started. This is a temporary treatment for malnourishment.  We will begin to taper this to see if this improves her GI function. High blood pressure  Started on a clonidine patch 05/06/2011 with subsequent good blood pressure control.  Was on Norvasc as an outpatient, but this is on hold since you have an NG tube in. Mild dehydration  Continue IV fluids. Low red cells  Likely from anemia of chronic disease and the sequela of chemotherapy.  Hemoglobin stable.  Anticipated discharge date: Depends on progress.

## 2011-05-12 NOTE — Progress Notes (Signed)
Patient ID: Krystal [name], female   DOB: 09/29/1970, 41 y.o.   MRN: [id number]    Subjective: Pt very somnolent following paracentesis earlier. Gastrostomy tube unable to be placed due to Barium in stomach, so IR will try and place tomorrow. Paracentesis drew off over 2 liters of ascites.  Objective: Vital signs in last 24 hours: Temp:  [97.6 F (36.4 C)-99.3 F (37.4 C)] 97.6 F (36.4 C) (04/30 1552) Pulse Rate:  [100-108] 108  (04/30 1418) Resp:  [14-20] 16  (04/30 1418) BP: (109-125)/(76-94) 118/86 mmHg (04/30 1418) SpO2:  [93 %-97 %] 96 % (04/30 1418) Last BM Date: 05/11/11  Intake/Output from previous day: 04/29 0701 - 04/30 0700 In: 1907 [P.O.:120; TPN:1787] Out: 704 [Urine:4; Emesis/NG output:700] Intake/Output this shift:    GI: remains very distended, quiet , hernia reducible, but nontender  Lab Results:   Basename 05/12/11 0406 05/11/11 0406  WBC 5.6 5.8  HGB 9.5* 9.4*  HCT 30.5* 31.0*  PLT 267 213   BMET  Basename 05/12/11 0406 05/11/11 0406  NA 132* 135  K 3.6 3.6  CL 92* 96  CO2 25 27  GLUCOSE 125* 120*  BUN 35* 29*  CREATININE 1.38* 1.21*  CALCIUM 9.3 9.2    Assessment/Plan: Malignant bowel obstruction- Continue conservative management and hopefully can get gastrostomy tube tomorrow.     LOS: 13 days    Carisha Kantor,PA-C Pager [phone] General Trauma Pager [phone]

## 2011-05-12 NOTE — Progress Notes (Signed)
Abdominal x-ray did confirm NG tube placement.  NG now draining a considerable amount of light green drainage.  Will continue to monitor.  [name] [facility]  05/12/2011

## 2011-05-13 ENCOUNTER — Inpatient Hospital Stay (HOSPITAL_COMMUNITY): Payer: Medicaid Other

## 2011-05-13 DIAGNOSIS — E43 Unspecified severe protein-calorie malnutrition: Secondary | ICD-10-CM

## 2011-05-13 DIAGNOSIS — K56609 Unspecified intestinal obstruction, unspecified as to partial versus complete obstruction: Secondary | ICD-10-CM

## 2011-05-13 DIAGNOSIS — R112 Nausea with vomiting, unspecified: Secondary | ICD-10-CM

## 2011-05-13 DIAGNOSIS — C549 Malignant neoplasm of corpus uteri, unspecified: Secondary | ICD-10-CM

## 2011-05-13 LAB — GLUCOSE, CAPILLARY
Glucose-Capillary: 118 mg/dL — ABNORMAL HIGH (ref 70–99)
Glucose-Capillary: 127 mg/dL — ABNORMAL HIGH (ref 70–99)
Glucose-Capillary: 135 mg/dL — ABNORMAL HIGH (ref 70–99)

## 2011-05-13 LAB — BASIC METABOLIC PANEL
BUN: 40 mg/dL — ABNORMAL HIGH (ref 6–23)
Chloride: 90 mEq/L — ABNORMAL LOW (ref 96–112)
GFR calc Af Amer: 45 mL/min — ABNORMAL LOW (ref >90)
GFR calc non Af Amer: 39 mL/min — ABNORMAL LOW (ref >90)
Potassium: 3.4 mEq/L — ABNORMAL LOW (ref 3.5–5.1)
Sodium: 130 mEq/L — ABNORMAL LOW (ref 135–145)

## 2011-05-13 MED ORDER — TRACE MINERALS CR-CU-MN-SE-ZN 10-1000-500-60 MCG/ML IV SOLN
INTRAVENOUS | Status: AC
  Administered 2011-05-13: 18:00:00 via INTRAVENOUS
  Filled 2011-05-13: qty 2000

## 2011-05-13 MED ORDER — POTASSIUM CHLORIDE 10 MEQ/100ML IV SOLN
10.0000 meq | INTRAVENOUS | Status: AC
  Administered 2011-05-13 (×2): 10 meq via INTRAVENOUS
  Filled 2011-05-13 (×2): qty 100

## 2011-05-13 MED ORDER — SODIUM CHLORIDE 0.9 % IV SOLN
INTRAVENOUS | Status: DC
  Administered 2011-05-13 – 2011-05-15 (×4): via INTRAVENOUS
  Filled 2011-05-13 (×8): qty 1000

## 2011-05-13 MED ORDER — FAT EMULSION 20 % IV EMUL
250.0000 mL | INTRAVENOUS | Status: AC
  Administered 2011-05-13: 250 mL via INTRAVENOUS
  Filled 2011-05-13: qty 250

## 2011-05-13 NOTE — Progress Notes (Signed)
Patient for possible G-tube placement tomorrow.  Patient is due for Lovenox tonight at 10 pm but no order to hold medication.  Notified Brayton El, PA with radiology and he stated to give the Lovenox earlier.  Pharmacy notified to change time. [name] [facility]  05/13/2011  4:47 PM

## 2011-05-13 NOTE — Progress Notes (Signed)
Patient ID: Krystal [name], female   DOB: Jul 19, 1970, 41 y.o.   MRN: [id number]    Subjective: Rough night due to emesis despite NGT and syncopal episode. ABD film this am still shows some Ba still in stomach, NGT in duodenum.  She is sleepy this am, but feeling slightly better She reports she passed some gas, no BM's. Objective: Vital signs in last 24 hours: Temp:  [97.6 F (36.4 C)-100.5 F (38.1 C)] 99.8 F (37.7 C) (05/01 0453) Pulse Rate:  [100-111] 111  (05/01 0453) Resp:  [14-22] 16  (05/01 0453) BP: (91-125)/(54-94) 91/63 mmHg (05/01 0453) SpO2:  [92 %-97 %] 92 % (05/01 0453) Last BM Date: 05/12/11  Intake/Output from previous day: 04/30 0701 - 05/01 0700 In: 2113 [NG/GT:60; IV Piggyback:94; TPN:1959] Out: 2600 [Emesis/NG output:600] Intake/Output this shift:    GI: seems less distended,Rare BS , nontender  Lab Results:   Basename 05/12/11 0406 05/11/11 0406  WBC 5.6 5.8  HGB 9.5* 9.4*  HCT 30.5* 31.0*  PLT 267 213   BMET  Basename 05/13/11 0625 05/12/11 0406  NA 130* 132*  K 3.4* 3.6  CL 90* 92*  CO2 29 25  GLUCOSE 127* 125*  BUN 40* 35*  CREATININE 1.58* 1.38*  CALCIUM 8.6 9.3    Assessment/Plan: Malignant bowel obstruction- Continue conservative management and hopefully can get gastrostomy tube today.  Will continue to follow with you.      LOS: 14 days    Cristan Scherzer,PA-C Pager [phone] General Trauma Pager [phone]

## 2011-05-13 NOTE — Progress Notes (Signed)
Barium remains in stomach. NGT in descending duodenum with high bilious output. Will order NG to be pulled back about 8-10cm, hopefully this will help remove residual barium. Won't be able to do G-tube today. Repeat KUB in am and hopefully be able to proceed tomorrow.  Brayton El PA-C 05/13/2011 8:58 AM

## 2011-05-13 NOTE — Progress Notes (Signed)
Patient's NG tube replaced by radiology.  Patient now hooked back to low intermittent suction and has already had approximately 400 cc of light green drainage.  [name] [facility]  05/13/2011

## 2011-05-13 NOTE — Progress Notes (Signed)
Patient unable to tolerate NG tube re-insertion.  States her nose is too sore and she is requesting to have it done in radiology so that they can "numb her nose and throat."  Dr. Darnelle Catalan notified and order received.  [name] [facility]  05/13/2011

## 2011-05-13 NOTE — Progress Notes (Signed)
Visit with pt in response to spiritual care consult.  Pt was lying in bed, lethargic.  Pt responded to chaplain presence and engaged with nods of head.   Chaplain provided brief emotional support around recent nausea and exhaustion of patient, and pt's recent procedure.  Pt spoke with chaplain briefly about children and brother who was present in room, but spoke little english.  Encounter brief due to pt's fatigue.    Pt is welcoming of follow up.

## 2011-05-13 NOTE — Progress Notes (Signed)
Patient continued to vomit and NG tube was forced out.  Will order new 14 FR NG tube and will replace.  Dr. Darnelle Catalan made aware.  [name] [facility]  05/13/2011

## 2011-05-13 NOTE — Progress Notes (Signed)
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 Surgery is option but would leave as last resort. Paracentesis done.  Would like to continue attempt at g tube placement by ir or gi and reserve surgery as last option for symptom management.

## 2011-05-13 NOTE — Progress Notes (Signed)
PROGRESS NOTE  Krystal [name] ZOX:[id number] DOB: 10-24-1970 DOA: 04/29/2011 PCP: [name], MD, MD  Brief narrative: Krystal [name] is a 41 year old female with a past medical history of endometrial cancer who was admitted on 04/29/2011 with abdominal cramping and a partial small bowel obstruction. She is being followed by surgery with no plans for surgical intervention at present. The plan, at this point, is to have interventional radiology place a G-tube for symptom management given her recurrent problems with nausea and vomiting.  Assessment/Plan: Principal Problem:  *Partial small bowel obstruction  Surgical consultation performed by Dr. Lodema Pilot on 05/03/11: Surgical intervention felt to be only used as a last resort.  Treated conservatively with bowel rest and NG tube decompression of the stomach.  Having intermittent bowel movements and passing flatus at this time.  NG tube out as of 05/09/2011. Replaced by interventional radiology on 05/12/2011. Dislodged again on 05/13/2011. We'll have interventional radiology replace given ongoing symptoms.  Surgery consultants recommending G-tube placement at this time.  Dr. Deanne Coffer of interventional radiology consulted for placement of the G-tube. Attempted to place on 05/12/2011 but not successful due to retained barium in the stomach. Barium found to remain in stomach on 05/13/2011.  Plan is to proceed with placement tomorrow if barium passes. Plan KUB to check on this.  TNA started for temporary nutritional purposes. Unable to wean yet. Active Problems:  Endometrial ca / Ascites  Status post cycle 1 cisplatin/Adriamycin.  Rapidly progressive per Dr. Precious Reel notes.  Paracentesis done on 05/12/2011.  Dr. Darrold Span has requested GYN-ONC consultation which is pending at this time.  Not currently strong enough for further chemotherapy.  We'll order palliative care consult to help assist with goals of care and CODE STATUS given poor  prognosis.  I spoke with the patient about the current plan of care and what a CODE STATUS is so that she can begin to think about these matters. She understands that I have requested the palliative care team to come and talk with her and her family. Neutropenia / thrombocytopenia  Related to prior chemotherapy.   Status post treatment with Neupogen, now discontinued.  No evidence of bleeding infection.  White blood cell count now normal and platelet count currently stable. Chronic constipation  Unable to tolerate MiraLAX.  Senokot-S and Dulcolax suppositories 3 times a day ordered by oncologist.  Would defer to surgery regarding the question of the benefit of Reglan as this medication can cause bowel perforation and obstruction. Inadequate oral intake  Wean TNA and by mouth intake improved.  Hypertension  Started on a clonidine patch 05/06/2011 with subsequent good blood pressure control.  Was on Norvasc as an outpatient.  Acute renal failure/mild hydronephrosis  Creatinine 1.2 on admission  Slightly worse, likely due to discontinuation of supplemental IV fluids.  IV fluids resumed. Will monitor creatinine. Normocytic anemia  Likely from anemia of chronic disease and the sequela of chemotherapy.  Hemoglobin stable.  Syncope/Fever  Had a syncopal event on 05/12/2011.  Started on Zosyn empirically for concerns of sepsis.  IV fluids ordered for concerns of intravascular depletion. Hypokalemia  IV fluids with potassium ordered.   Code Status: Full Family Communication: Updated at bedside. Disposition Plan: Home, when stable.  Medical Consultants:  Dr. Jama Flavors, Oncology  Dr. Lodema Pilot, Surgery  Dr. Deanne Coffer, Interventional radiology  Other consultants:  Pharmacy  Antibiotics:  Zosyn 05/12/2011--->   Subjective  Krystal [name] now has an NG tube in place.  Pain controlled.  Seems weak.  Objective    Interim History: Stable  overnight.   Objective: Filed Vitals:   05/13/11 0215 05/13/11 0453 05/13/11 1029 05/13/11 1505  BP: 91/54 91/63 110/80 114/84  Pulse: 102 111 102 96  Temp: 99.6 F (37.6 C) 99.8 F (37.7 C) 98.6 F (37 C) 98.2 F (36.8 C)  TempSrc: Oral Oral Oral Oral  Resp: 16 16 16 18   Height:      Weight:      SpO2: 95% 92% 96% 96%    Intake/Output Summary (Last 24 hours) at 05/13/11 1513 Last data filed at 05/13/11 0750  Gross per 24 hour  Intake   2113 ml  Output    600 ml  Net   1513 ml    Exam: Gen:  NAD Cardiovascular:  RRR, No M/R/G Respiratory: Lungs CTAB Gastrointestinal: Abdomen very distended with sluggish bowel sounds. Extremities: No C/E/C    Data Reviewed: Basic Metabolic Panel:  Lab 05/13/11 4098 05/12/11 0406 05/11/11 0406 05/10/11 0406 05/09/11 0630 05/08/11 0415 05/07/11 0500  NA 130* 132* 135 135 137 -- --  K 3.4* 3.6 -- -- -- -- --  CL 90* 92* 96 98 100 -- --  CO2 29 25 27 25 28  -- --  GLUCOSE 127* 125* 120* 115* 116* -- --  BUN 40* 35* 29* 22 17 -- --  CREATININE 1.58* 1.38* 1.21* 1.22* 1.14* -- --  CALCIUM 8.6 9.3 9.2 9.2 9.3 -- --  MG -- -- 2.2 -- 1.9 1.8 1.7  PHOS -- 4.7* 4.7* -- 4.6 4.4 3.5   GFR Estimated Creatinine Clearance: 52.8 ml/min (by C-G formula based on Cr of 1.58). Liver Function Tests:  Lab 05/11/11 0406 05/08/11 0415 05/07/11 0500  AST 78* 35 25  ALT 89* 34 20  ALKPHOS 121* 106 96  BILITOT 0.6 0.6 0.7  PROT 7.0 6.9 6.7  ALBUMIN 2.9* 2.9* 2.8*    CBC:  Lab 05/12/11 0406 05/11/11 0406 05/09/11 0630 05/08/11 0415 05/07/11 0500  WBC 5.6 5.8 6.0 2.2* 1.3*  NEUTROABS -- -- -- 1.3* 0.5*  HGB 9.5* 9.4* 9.1* 8.7* 8.3*  HCT 30.5* 31.0* 29.3* 28.3* 27.0*  MCV 82.9 82.9 82.3 83.0 82.3  PLT 267 213 134* 115* 116*   Microbiology Recent Results (from the past 240 hour(s))  URINE CULTURE     Status: Normal   Collection Time   05/03/11 10:27 PM      Component Value Range Status Comment   Specimen Description URINE, RANDOM   Final     Special Requests NONE   Final    Culture  Setup Time [id number]   Final    Colony Count 25,000 COLONIES/ML   Final    Culture     Final    Value: Multiple bacterial morphotypes present, none predominant. Suggest appropriate recollection if clinically indicated.   Report Status 05/05/2011 FINAL   Final   CULTURE, BLOOD (ROUTINE X 2)     Status: Normal   Collection Time   05/03/11 11:10 PM      Component Value Range Status Comment   Specimen Description BLOOD RIGHT [facility]   Final    Special Requests BOTTLES DRAWN AEROBIC AND ANAEROBIC 5CC   Final    Culture  Setup Time [id number]   Final    Culture NO GROWTH 5 DAYS   Final    Report Status 05/10/2011 FINAL   Final     Procedures and Diagnostic Studies:  Dg Abd 1 View 05/06/2011 IMPRESSION: Worsening dilatation of the small  intestine consistent with worsening partial small bowel obstruction.  Original Report Authenticated By: Thomasenia Sales, M.D.    Dg Abd 1 View 05/03/2011 IMPRESSION:  1.  Persistent retained contrast.  Last contrast exam 04/29/2011. 2.  Persistent dilatation of small bowel loops, consistent with at least partial small bowel obstruction.  Original Report Authenticated By: Patterson Hammersmith, M.D.    Dg Abd 1 View 05/01/2011 IMPRESSION: Recurrent small bowel dilatation as demonstrated on recent CT and concerning for partial small-bowel obstruction, possibly on the basis of adhesions or peritoneal carcinomatosis.  Original Report Authenticated By: Gerrianne Scale, M.D.    Abd 1 View (kub) 04/30/2011  IMPRESSION: Normal stool and bowel gas pattern.  Original Report Authenticated By: Brandon Melnick, M.D.    Ct Abdomen Pelvis W Contrast 04/29/2011 IMPRESSION: Dilated loops of small bowel in the left upper abdomen, compatible with at least early/partial small bowel obstruction, possibly on the basis of adhesions.  Additional suspected focal transition in the left mid abdomen.  Interval development of moderate abdominopelvic  ascites with peritoneal nodularity, suggesting peritoneal disease.  Gastrohepatic and retroperitoneal nodal metastases, as described above.  Diminished enhancement of the left kidney relative to the right with mild left hydronephrosis, possibly on the basis of extrinsic compression on the mid/lower ureter by peritoneal disease.  Midline lower pelvic ventral hernia containing a loop of small bowel. Original Report Authenticated By: Charline Bills, M.D.     Dg Chest Port 1 View 05/03/2011  IMPRESSION: Extremely low lung volumes with bilateral lower lobe atelectasis and possible retrocardiac opacity.  PA and lateral chest radiograph at full inspiration are recommended when the patient is clinically able, for better evaluation.  Original Report Authenticated By: Harrel Lemon, M.D.    Dg Abd Acute W/chest 05/04/2011 IMPRESSION: Bowel gas pattern suggests residual partial small bowel obstruction.  Original Report Authenticated By: Reyes Ivan, M.D.    Dg Abd 1 View 05/13/2011  IMPRESSION: Persistent small bowel obstruction.  Some barium does remain in the fundus of the stomach in the supine position.  Original Report Authenticated By: Juline Patch, M.D.    Dg Abd 1 View 05/12/2011 IMPRESSION: NG tube tip in the antrum of the stomach.  There is little change in gaseous distention of small bowel loops.  Original Report Authenticated By: Juline Patch, M.D.    Ir Basil Dess Tube Plc W/fl W/rad 05/12/2011 The patient is scheduled for a percutaneous gastrostomy tube. However, there is barium within the stomach which is not ideal for placement of a gastrostomy tube.  As a result, a nasogastric tube was placed in order to remove the barium and decompress the dilated bowel loops.  The patient will be evaluated again on May 13, 2011 for the gastrostomy tube placement.  Original Report Authenticated By: Richarda Overlie, M.D.    Ir Paracentesis 05/12/2011  Findings:Large amount of ascites.  2 liters of fluid was  removed. Barium within the stomach and dilated small bowel loops. Gas-filled structures throughout the abdomen may represent colon and small bowel loops.  Complications: None  Impression:Successful ultrasound guided paracentesis.  2 liters of fluid was removed.  The patient is scheduled for a percutaneous gastrostomy tube. However, there is barium within the stomach which is not ideal for placement of a gastrostomy tube.  As a result, a nasogastric tube was placed in order to remove the barium and decompress the dilated bowel loops.  The patient will be evaluated again on May 13, 2011 for the  gastrostomy tube placement.  Original Report Authenticated By: Richarda Overlie, M.D.   Scheduled Meds:    . bisacodyl  10 mg Rectal TID  .  ceFAZolin (ANCEF) IV  2 g Intravenous On Call  . cloNIDine  0.2 mg Transdermal Weekly  . enoxaparin  40 mg Subcutaneous Q24H  . insulin aspart  0-9 Units Subcutaneous Q4H  . lidocaine   Topical Once  . piperacillin-tazobactam (ZOSYN)  IV  3.375 g Intravenous Q8H  . potassium chloride  10 mEq Intravenous Q1 Hr x 2  . DISCONTD: lactulose  30 g Oral TID  . DISCONTD: senna-docusate  3 tablet Oral TID   Continuous Infusions:    . fat emulsion 250 mL (05/12/11 0500)  . TPN (CLINIMIX) +/- additives 80 mL/hr at 05/13/11 0538   And  . fat emulsion 250 mL (05/13/11 0538)  . TPN (CLINIMIX) +/- additives     And  . fat emulsion    . 0.9 % sodium chloride with kcl 75 mL/hr at 05/13/11 0926  . TPN [facility]) +/- additives 60 mL/hr at 05/12/11 0500      LOS: 14 days   Hillery Aldo, MD Pager [phone]  05/13/2011, 3:13 PM  Patient teaching/information given to the patient and reviewed with the patient:  Krystal [name] 05/13/2011  Treatment team:  Dr. Hillery Aldo, Hospitalist (Internist)  Dr. Jama Flavors, Oncologist  Dr. Lodema Pilot (and partners), Surgeon  Dr. Deanne Coffer, Interventional radiologist  Medical Issues and plan: Principal Problem:   *Partial blockage of the bowel  Surgical consultation performed by Dr. Lodema Pilot on 05/03/11: Surgical intervention felt to be only used as a last resort for complete bowel obstruction. The surgeons feel that surgery would be very risky and likely not solve the underlying problem.  Treated conservatively with bowel rest and NG tube decompression of the stomach.  So far, have not been able to place G-tube do to retained barium in the stomach which we are hoping will clear soon.  G-tube placement planned for tomorrow. Active Problems:  Endometrial cancer  Status post chemotherapy.  This is a very aggressive form of cancer. Right now, Dr. Darrold Span feels you are too weak to undergo chemotherapy and that we would need to have a intermittent/partial bowel obstruction issues more resolved before you undergo further chemotherapy.  The palliative care doctor's will come and talk to you and your family about goals of care and her CODE STATUS.  A CODE STATUS is a directive that tells healthcare workers what interventions you might want or not want in the event of a medical emergency that is life threatening. Low white cells and platelets  Your blood counts are now normal. Chronic constipation  Senokot S. and Dulcolax suppositories have been ordered. Inadequate oral intake  TNA. Started. This is a temporary treatment for malnourishment. High blood pressure  Started on a clonidine patch 05/06/2011 with subsequent good blood pressure control.  Was on Norvasc as an outpatient, but this is on hold since you have an NG tube in. Mild dehydration  Continue IV fluids. Low red cells  Likely from anemia of chronic disease and the sequela of chemotherapy.  Hemoglobin stable.  Anticipated discharge date: Depends on progress.

## 2011-05-13 NOTE — Progress Notes (Signed)
PARENTERAL NUTRITION CONSULT NOTE - Follow-Up  Pharmacy Consult for TNA Indication: pSBO  No Known Allergies  Patient Measurements: Height: 5\' 6"  (167.6 cm) Weight: 205 lb (92.987 kg) IBW/kg (Calculated) : 59.3  Adjusted Body Weight: 69 kg  Vital Signs: Temp: 99.8 F (37.7 C) (05/01 0453) Temp src: Oral (05/01 0453) BP: 91/63 mmHg (05/01 0453) Pulse Rate: 111  (05/01 0453) Intake/Output from previous day: 04/30 0701 - 05/01 0700 In: 2113 [NG/GT:60; IV Piggyback:94; TPN:1959] Out: 2600 [Emesis/NG output:600] Intake/Output from this shift:    Labs:  [facility] 05/12/11 0406 05/11/11 0406  WBC 5.6 5.8  HGB 9.5* 9.4*  HCT 30.5* 31.0*  PLT 267 213  APTT 29 --  INR 0.93 --     Basename 05/13/11 0625 05/12/11 0406 05/11/11 0406  NA 130* 132* 135  K 3.4* 3.6 3.6  CL 90* 92* 96  CO2 29 25 27   GLUCOSE 127* 125* 120*  BUN 40* 35* 29*  CREATININE 1.58* 1.38* 1.21*  LABCREA -- -- --  CREAT24HRUR -- -- --  CALCIUM 8.6 9.3 9.2  MG -- -- 2.2  PHOS -- 4.7* 4.7*  PROT -- -- 7.0  ALBUMIN -- -- 2.9*  AST -- -- 78*  ALT -- -- 89*  ALKPHOS -- -- 121*  BILITOT -- -- 0.6  BILIDIR -- -- --  IBILI -- -- --  PREALBUMIN -- -- 11.8*  TRIG -- -- 141  CHOLHDL -- -- --  CHOL -- -- 168   Estimated Creatinine Clearance: 52.8 ml/min (by C-G formula based on Cr of 1.58).    Basename 05/13/11 0755 05/13/11 0359 05/13/11 0008  GLUCAP 125* 135* 118*    Medical History: Past Medical History  Diagnosis Date  . Hypertension     SINCE FIRST PREGNANCY  . Asthma   . Constipation   . History of miscarriage   . Preeclampsia   . Postoperative ileus 07/2010  . Iron deficiency anemia     s/p transfusions [facility]  . Uterine cancer     Medications:  Scheduled:     . bisacodyl  10 mg Rectal TID  .  ceFAZolin (ANCEF) IV  2 g Intravenous On Call  . cloNIDine  0.2 mg Transdermal Weekly  . enoxaparin  40 mg Subcutaneous Q24H  . insulin aspart  0-9 Units Subcutaneous Q4H  . lactulose   30 g Oral TID  . lidocaine   Topical Once  . piperacillin-tazobactam (ZOSYN)  IV  3.375 g Intravenous Q8H  . senna-docusate  3 tablet Oral TID   Infusions:     . fat emulsion 250 mL (05/12/11 0500)  . TPN (CLINIMIX) +/- additives 80 mL/hr at 05/13/11 0538   And  . fat emulsion 250 mL (05/13/11 0538)  . TPN (CLINIMIX) +/- additives 60 mL/hr at 05/12/11 0500    Insulin Requirements in the past 24 hours:  CBG 118-135, 2 units SSI used  Current Nutrition:  NPO  Clinimix 5/15 at 80 mL/hr thru [facility] Fat Emulsion 20% at 10 mL/hr MIVF (NS w/ KCl/L) started by MD, to be infused through [facility] concurrently w/TNA  Nutritional Goals:  Per RD: Kcal goals: 1750-2050/day, Protein 70-90g/day, Fluid 1.7-2L/day Clinimix 5/15: goal rate 80 ml/hr (1976ml/day) = 1843kcal/day and 96g protein/day  Assessment:  41 yo female with past medical history of endometrial cancer admitted on 4/17 with abdominal cramping and a partial SBO. No current plans for surgical intervention, being managed conservatively; bowel rest, NG tube replaced. Plan G tube for decompression  Had emesis/syncope last night  TNA was started 4/25 via Port-a-cath, increased to goal 4/28. Started wean 4/29 but unable to tolerate PO so increased back to goal last night. Currently on electrolyte-free formula d/t elevated Phos.  Chemistires: Na low, K low, CO2 low, Scr trending up  LFTs slightly increased  Lipids wnl (TG 141, 4/29)  Prealbumin - 9.7 4/26, 11.8 4/29  Change to no electrolyte formula  Plan:   Continue Clinimix 5/15, electrolyte free forumula at goal rate  Continue Lipids daily at 10 ml/hr  Replace K w/ KCL runs x 2 ( each)  MD initiate MIVF w/NS w/57meq KCl per liter, hopefully Na will increase  Due to national shortages, adding MVI on MWF only  Lab: BMET (plan phos level on Thursday)  Gwen Her PharmD  [phone] 05/13/2011 8:44 AM

## 2011-05-13 NOTE — Progress Notes (Signed)
Palliative Medicine Team consult to address, code status, symptom management, goals of care requested by Dr Darnelle Catalan; spoke with patient at bedside-many family members in room including 3 small children - patient requests discussion take place on Friday, per notes, hopeful for G -tube placement tomorrow, and her mother to arrive in [place]- pt voiced request for mother to be present as well as her brother, Dim and possibly other family (maybe 5 family members total) - offered, and patient agreeable, to having [place] interpretor as all family from [place] and understand limited [place]- message left with SW office to arrange [place] interpretor - will follow up tomorrow to confirm availability of interpretor.  Currently, PMT meeting scheduled for Friday, 05/15/11 @ 12:30 pm.  Valente David, RN 05/13/2011, 6:05 PM Palliative Medicine Team RN Liaison [phone]

## 2011-05-13 NOTE — Progress Notes (Signed)
05/13/2011, 8:25 AM  Hospital day: 15 Antibiotics: zosyn day 2  Chemotherapy: day 17 cycle 1 cisplatin/adriamycin; previously treated with total 6 cycles taxol/carboplatin thru Jan 2013 (given in sandwich fashion with RT).  Subjective: More comfortable since paracentesis of 2 liters by IR yesterday and since NG replaced and stomach emptied of  ~ 600cc when NG repositioned.  Gtube not done yesterday due to barium in stomach, at least some of which came out subsequently via NG. She had near syncopal episode yesterday when up to BR vomiting, possibly vasovagal, then temp 100.2 with zosyn begun. She has not had IVF in addition to TNA and may have also been intravascularly depleted between gastric contents and ascites. Peripheral IV begun in left hand for zosyn, difficult access; can run additional IVF with TNA per pharmacy, discussed with RN. Patient slept more comfortably last pm. No localizing symptoms of infection. Still feels weak today. No suppositories used yesterday with other events, but she has regularly had small BMs after suppositories.  Patient's mother obtained VISA and is enroute now, expected in [place] probably tomorrow. [facility] Child psychotherapist has arranged for a Environmental manager to take portrait of her with children ~ this weekend.  We have discussed further chemotherapy. I have told her that she needs to be more stable from standpoint of bowel obstruction before further chemo, but that we could try adriamycin alone if she improves to allow a second cycle. I have also mentioned hospice assistance at home if chemo is not continued. I have asked gyn oncology to see her also this week, for their thoughts re any attempt at further chemotherapy vs hospice.  Objective: Vital signs in last 24 hours: Blood pressure 91/63, pulse 111, temperature 99.8 F (37.7 C), temperature source Oral, resp. rate 16, height 5\' 6"  (1.676 m), weight 205 lb (92.987 kg), last menstrual period 07/02/2010, SpO2 92.00%. Awake  and alert, NAD supine in bed with NG to suction. No JVD. Lungs clear anteriorly. PAC ok. Heart RRR. Abdomen still distended but softer, no BS heard, not tender.LE no edema. Peripheral IV left hand site ok, locked and used for zosyn now. Repositions herself easily in bed.   Intake/Output from previous day: 04/30 0701 - 05/01 0700 In: 2113 [NG/GT:60; IV Piggyback:94; TPN:1959] Out: 2600 [Emesis/NG output:600] Intake/Output this shift:      Lab Results:  Basename 05/12/11 0406 05/11/11 0406  WBC 5.6 5.8  HGB 9.5* 9.4*  HCT 30.5* 31.0*  PLT 267 213   BMET  Basename 05/13/11 0625 05/12/11 0406  NA 130* 132*  K 3.4* 3.6  CL 90* 92*  CO2 29 25  GLUCOSE 127* 125*  BUN 40* 35*  CREATININE 1.58* 1.38*  CALCIUM 8.6 9.3    Studies/Results: Dg Abd 1 View  05/13/2011  *RADIOLOGY REPORT*  Clinical Data: Small bowel obstruction, follow-up  ABDOMEN - 1 VIEW  Comparison: Abdomen film of 05/12/2011  Findings: There are persistently dilated loops of small bowel consistent with small bowel obstruction.  Very little colonic bowel gas is seen.  An NG tube is present and there is a small amount of barium remaining within the fundus of the stomach in the supine position. An NG tube is present with the tip in the region of the descending duodenum.  IMPRESSION: Persistent small bowel obstruction.  Some barium does remain in the fundus of the stomach in the supine position.  Original Report Authenticated By: Juline Patch, M.D.   Dg Abd 1 View  05/12/2011  *RADIOLOGY REPORT*  Clinical  Data: Evaluate NG tube placement, vomiting  ABDOMEN - 1 VIEW  Comparison: Abdomen film of 05/11/2011  Findings: The NG tube coils in the fundus of the stomach with the tip extending into the antrum of the stomach.  Some contrast layers within the fundus of the stomach.  Dilated small bowel loops again are noted, and there is left basilar atelectasis present.  IMPRESSION: NG tube tip in the antrum of the stomach.  There is  little change in gaseous distention of small bowel loops.  Original Report Authenticated By: Juline Patch, M.D.   Dg Abd 1 View  05/11/2011  *RADIOLOGY REPORT*  Clinical Data: Follow-up bowel obstruction.  ABDOMEN - 1 VIEW  Comparison: Abdominal radiograph 05/06/2011, 05/04/2011, 04/29/2011.  Findings: There is persistent small bowel dilatation.  Small bowel loops measure up to 5 cm in diameter.  There is some gas within the colon.  Loss of normal abdominal fat planes is consistent with the patient's known ascites.  No free intraperitoneal air is detected on these supine radiographs.  Surgical clips are present in the right aspect of the pelvis, and there is a single surgical clip in the left mid abdomen.  IMPRESSION: 1. No significant change in small bowel obstruction pattern compared to the abdominal radiographs of 05/06/2011. 2.  Evidence of ascites.  Original Report Authenticated By: Britta Mccreedy, M.D.   Ir Basil Dess Tube Plc W/fl W/rad  05/12/2011  *RADIOLOGY REPORT*  Clinical history:41 year old with metastatic endometrial cancer. The patient has a small bowel obstruction and a gastrostomy tube has been requested for bowel decompression.  PROCEDURE(S): ULTRASOUND GUIDED PARACENTESIS; FLUOROSCOPIC GUIDED PLACEMENT OF NASOGASTRIC TUBE  Physician: Rachelle Hora. Lowella Dandy, MD  Medications:None  Moderate sedation time:None  Fluoroscopy time: 0.9 minutes  Procedure:Informed consent was obtained for a paracentesis and gastrostomy tube placement.  The patient was evaluated with ultrasound and fluoroscopy.  Ultrasound demonstrated a large amount of abdominal ascites.  Fluoroscopy demonstrated barium within the stomach with dilated loops of small bowel.  The right side of the abdomen was prepped and draped in a sterile fashion. The skin was anesthetized with lidocaine.  Using ultrasound guidance, a 5-French Yueh catheter was directed into the peritoneal fluid.  2 liters of yellowish-green ascites was removed.  The right  nostril was anesthetized with viscous lidocaine.  14- French nasogastric tube was inserted and advanced in the stomach. Positioning was confirmed with fluoroscopy.  The tube was secured to the nose.  Findings:Large amount of ascites.  2 liters of fluid was removed. Barium within the stomach and dilated small bowel loops. Gas-filled structures throughout the abdomen may represent colon and small bowel loops.  Complications: None  Impression:Successful ultrasound guided paracentesis.  2 liters of fluid was removed.  The patient is scheduled for a percutaneous gastrostomy tube. However, there is barium within the stomach which is not ideal for placement of a gastrostomy tube.  As a result, a nasogastric tube was placed in order to remove the barium and decompress the dilated bowel loops.  The patient will be evaluated again on May 13, 2011 for the gastrostomy tube placement.  Original Report Authenticated By: Richarda Overlie, M.D.   Ir Paracentesis  05/12/2011  *RADIOLOGY REPORT*  Clinical history:41 year old with metastatic endometrial cancer. The patient has a small bowel obstruction and a gastrostomy tube has been requested for bowel decompression.  PROCEDURE(S): ULTRASOUND GUIDED PARACENTESIS; FLUOROSCOPIC GUIDED PLACEMENT OF NASOGASTRIC TUBE  Physician: Rachelle Hora. Henn, MD  Medications:None  Moderate sedation time:None  Fluoroscopy time: 0.9  minutes  Procedure:Informed consent was obtained for a paracentesis and gastrostomy tube placement.  The patient was evaluated with ultrasound and fluoroscopy.  Ultrasound demonstrated a large amount of abdominal ascites.  Fluoroscopy demonstrated barium within the stomach with dilated loops of small bowel.  The right side of the abdomen was prepped and draped in a sterile fashion. The skin was anesthetized with lidocaine.  Using ultrasound guidance, a 5-French Yueh catheter was directed into the peritoneal fluid.  2 liters of yellowish-green ascites was removed.  The right nostril  was anesthetized with viscous lidocaine.  14- French nasogastric tube was inserted and advanced in the stomach. Positioning was confirmed with fluoroscopy.  The tube was secured to the nose.  Findings:Large amount of ascites.  2 liters of fluid was removed. Barium within the stomach and dilated small bowel loops. Gas-filled structures throughout the abdomen may represent colon and small bowel loops.  Complications: None  Impression:Successful ultrasound guided paracentesis.  2 liters of fluid was removed.  The patient is scheduled for a percutaneous gastrostomy tube. However, there is barium within the stomach which is not ideal for placement of a gastrostomy tube.  As a result, a nasogastric tube was placed in order to remove the barium and decompress the dilated bowel loops.  The patient will be evaluated again on May 13, 2011 for the gastrostomy tube placement.  Original Report Authenticated By: Richarda Overlie, M.D.   Discussed with radiology now: with residual barium on xray this am will not try Gtube today. Radiology also concerned re ascites with Gtube. NG position to be adjusted again to try to clear barium, and will repeat Xray in am.  Assessment/Plan: 1. Endometrial carcinoma: ongoing bowel obstruction, no obvious improvement from single cycle of cisplatin/adriamycin given shortly prior to bowel obstruction. Gtube if possible for symptom management. Add NS with 10 KCl now. Gyn oncology consult requested re their recommendations for attempting further chemo vs strict palliative care. May be helpful also to get palliative consult for goals of care, tho I know that [facility] SW has been talking with her about this from standpoint of the children. Mother en route from [facility], has not seen patient in 13 years and has never seen the 3 small children. Code status discussion to be included with all of above. 2.PAC in 3.severe, chronic constipation since childhood: bowels would not move for 2+ weeks at a time x years  prior to cancer diagnosis, then did better on regular miralax thru initial chemo. Now trying senokot or lactulose + tid suppositories. 4.social concerns: not [place] citizen, no insurance, 3 young children (youngest not yet 2 yrs).    Jarriel Papillion P

## 2011-05-13 NOTE — Progress Notes (Signed)
NG tube pulled back 10 cm per radiologist order and resecured.  Immediate return of a lighter color green fluid with some white in it (? Barium).  Scheduled lactulose given per Dr. Precious Reel order.  Patient immediately began vomiting green bile.  NG hooked back to low intermittent suction.  Patient given 0.5mg  sublingual ativan.  Patient now resting more comfortably.  Will monitor.  [name] [facility]  05/13/2011

## 2011-05-14 ENCOUNTER — Inpatient Hospital Stay (HOSPITAL_COMMUNITY): Payer: Medicaid Other

## 2011-05-14 DIAGNOSIS — E43 Unspecified severe protein-calorie malnutrition: Secondary | ICD-10-CM

## 2011-05-14 DIAGNOSIS — E876 Hypokalemia: Secondary | ICD-10-CM

## 2011-05-14 DIAGNOSIS — K56609 Unspecified intestinal obstruction, unspecified as to partial versus complete obstruction: Secondary | ICD-10-CM

## 2011-05-14 DIAGNOSIS — C549 Malignant neoplasm of corpus uteri, unspecified: Secondary | ICD-10-CM

## 2011-05-14 DIAGNOSIS — R112 Nausea with vomiting, unspecified: Secondary | ICD-10-CM

## 2011-05-14 LAB — GLUCOSE, CAPILLARY
Glucose-Capillary: 101 mg/dL — ABNORMAL HIGH (ref 70–99)
Glucose-Capillary: 108 mg/dL — ABNORMAL HIGH (ref 70–99)

## 2011-05-14 LAB — COMPREHENSIVE METABOLIC PANEL
BUN: 33 mg/dL — ABNORMAL HIGH (ref 6–23)
CO2: 27 mEq/L (ref 19–32)
Calcium: 8.7 mg/dL (ref 8.4–10.5)
Creatinine, Ser: 1.37 mg/dL — ABNORMAL HIGH (ref 0.50–1.10)
GFR calc Af Amer: 54 mL/min — ABNORMAL LOW (ref >90)
GFR calc non Af Amer: 46 mL/min — ABNORMAL LOW (ref >90)
Glucose, Bld: 121 mg/dL — ABNORMAL HIGH (ref 70–99)

## 2011-05-14 LAB — PHOSPHORUS: Phosphorus: 3.1 mg/dL (ref 2.3–4.6)

## 2011-05-14 MED ORDER — FLEET ENEMA 7-19 GM/118ML RE ENEM
1.0000 | ENEMA | Freq: Every day | RECTAL | Status: DC | PRN
  Administered 2011-05-19 – 2011-05-20 (×2): 1 via RECTAL
  Filled 2011-05-14 (×2): qty 1

## 2011-05-14 MED ORDER — POTASSIUM CHLORIDE 10 MEQ/100ML IV SOLN
10.0000 meq | INTRAVENOUS | Status: AC
  Administered 2011-05-14 (×4): 10 meq via INTRAVENOUS
  Filled 2011-05-14 (×4): qty 100

## 2011-05-14 MED ORDER — LIDOCAINE HCL 1 % IJ SOLN
INTRAMUSCULAR | Status: AC
  Filled 2011-05-14: qty 20

## 2011-05-14 MED ORDER — MIDAZOLAM HCL 5 MG/5ML IJ SOLN
INTRAMUSCULAR | Status: AC | PRN
  Administered 2011-05-14: 4 mg via INTRAVENOUS
  Administered 2011-05-14: 2 mg via INTRAVENOUS

## 2011-05-14 MED ORDER — CLINIMIX/DEXTROSE (5/15) 5 % IV SOLN
INTRAVENOUS | Status: AC
  Administered 2011-05-14: 17:00:00 via INTRAVENOUS
  Filled 2011-05-14: qty 2000

## 2011-05-14 MED ORDER — FAT EMULSION 20 % IV EMUL
250.0000 mL | INTRAVENOUS | Status: AC
  Administered 2011-05-14: 250 mL via INTRAVENOUS
  Filled 2011-05-14: qty 250

## 2011-05-14 MED ORDER — MIDAZOLAM HCL 2 MG/2ML IJ SOLN
INTRAMUSCULAR | Status: AC
  Filled 2011-05-14: qty 6

## 2011-05-14 MED ORDER — FLEET ENEMA 7-19 GM/118ML RE ENEM
1.0000 | ENEMA | Freq: Once | RECTAL | Status: AC
  Administered 2011-05-14: 1 via RECTAL
  Filled 2011-05-14: qty 1

## 2011-05-14 MED ORDER — FENTANYL CITRATE 0.05 MG/ML IJ SOLN
INTRAMUSCULAR | Status: AC
  Filled 2011-05-14: qty 4

## 2011-05-14 MED ORDER — FENTANYL CITRATE 0.05 MG/ML IJ SOLN
INTRAMUSCULAR | Status: AC | PRN
  Administered 2011-05-14 (×2): 100 ug via INTRAVENOUS

## 2011-05-14 MED ORDER — FENTANYL CITRATE 0.05 MG/ML IJ SOLN
INTRAMUSCULAR | Status: AC
  Filled 2011-05-14: qty 2

## 2011-05-14 NOTE — Procedures (Signed)
Successful placement of 20 French gastrostomy tube.  Will put tube on wall suction for now.

## 2011-05-14 NOTE — Progress Notes (Signed)
Received call from Dr Darrold Span to inform that she had spoken with patient and the patient at this time does not want to move forward with the PMT consult which had been scheduled for tomorrow- Dr Darrold Span feels there is no other needs from the PMT at this time -this RN will notify SW office/ Interpretor services to cancel request for interpretor and also informed attending Dr Darnelle Catalan - PMT will sign off please re-consult if the team can be of any assistance in the future.    Valente David, RN 05/14/2011, 8:55 AM Palliative Medicine Team RN Liaison [phone]

## 2011-05-14 NOTE — Progress Notes (Signed)
PROGRESS NOTE  Krystal [name]:[id number] DOB: January 14, 1970 DOA: 04/29/2011 PCP: [name], MD, MD  Brief narrative: Krystal [name] is a 41 year old female with a past medical history of endometrial cancer who was admitted on 04/29/2011 with abdominal cramping and a partial small bowel obstruction. She is being followed by surgery with no plans for surgical intervention at present. The plan, at this point, is to have interventional radiology place a G-tube for symptom management given her recurrent problems with nausea and vomiting. Despite her poor prognosis and failure to thrive as well as ongoing issues with bowel obstruction, the plan is to continue to proceed with chemotherapy per the patient's wishes.  Assessment/Plan: Principal Problem:  *Partial small bowel obstruction  Surgical consultation performed by Dr. Lodema Pilot on 05/03/11: Surgical intervention felt to be only used as a last resort.  Treated conservatively with bowel rest and NG tube decompression of the stomach.  Having intermittent bowel movements and passing flatus at this time.  Continues to require nasogastric suction to prevent intractable nausea and vomiting. The NG tube has been replaced several times by interventional radiology as it has become dislodged on multiple occasions.  Surgery consultants recommending G-tube placement at this time.  Dr. Deanne Coffer of interventional radiology consulted for placement of the G-tube. Attempted to place on 05/12/2011 but not successful due to retained barium in the stomach. Barium found to remain in stomach on 05/13/2011.  Interventional radiology plans to attempt to place the G-tube today, as based on KUB today, barium has cleared.  TNA started for temporary nutritional purposes. Unable to wean yet. Active Problems:  Endometrial ca / Ascites  Status post cycle 1 cisplatin/Adriamycin.  Rapidly progressive per Dr. Precious Reel notes.  Paracentesis done on 05/12/2011.  Dr.  Darrold Span has requested GYN-ONC consultation which is pending at this time.  Not currently strong enough for further chemotherapy.  I ordered a palliative care consult 05/13/2011 to help assist with goals of care and CODE STATUS given poor prognosis, and to address the patient's need to understand all of her options. This order was rescinded I. Dr. Darrold Span.  I spoke with the patient extensively on 05/13/2011 about the current plan of care and what a CODE STATUS is so that she can begin to think about these matters. Neutropenia / thrombocytopenia  Related to prior chemotherapy.   Status post treatment with Neupogen, now discontinued.  No evidence of bleeding infection.  White blood cell count now normal and platelet count currently stable. Chronic constipation  Unable to tolerate MiraLAX.  Senokot-S and Dulcolax suppositories 3 times a day ordered by oncologist.  Would defer to surgery regarding the question of the benefit of Reglan as this medication can cause bowel perforation and obstruction.  Given a fleets enema today. Inadequate oral intake  Wean TNA and by mouth intake improved.  Hypertension  Started on a clonidine patch 05/06/2011 with subsequent good blood pressure control.  Was on Norvasc as an outpatient.  Acute renal failure/mild hydronephrosis  Creatinine 1.2 on admission, with worsening creatinine 1 IV fluids discontinued.  Continue IV fluids. Normocytic anemia  Likely from anemia of chronic disease and the sequela of chemotherapy.  Hemoglobin stable.  Syncope/Fever  Had a syncopal event on 05/12/2011.  Started on Zosyn empirically for concerns of sepsis.  IV fluids ordered for concerns of intravascular depletion. Hypokalemia  IV fluids with potassium ordered.   Code Status: Full Family Communication: Updated at bedside. Disposition Plan: Home, when stable.  Medical Consultants:  Dr. [place] Flavors,  Oncology  Dr. Lodema Pilot, Surgery  Dr.  Deanne Coffer, Interventional radiology  Other consultants:  Pharmacy  Antibiotics:  Zosyn 05/12/2011--->   Subjective  Krystal [name] now has an NG tube in place.  No current nausea or vomiting. She has had some intermittent abdominal pain but currently feels well, sitting up in a chair.     Objective    Interim History: Stable overnight.   Objective: Filed Vitals:   05/13/11 1029 05/13/11 1505 05/13/11 2140 05/14/11 0438  BP: 110/80 114/84 108/75 108/76  Pulse: 102 96 95 94  Temp: 98.6 F (37 C) 98.2 F (36.8 C) 98.8 F (37.1 C) 98.9 F (37.2 C)  TempSrc: Oral Oral Oral Oral  Resp: 16 18 17 18   Height:      Weight:      SpO2: 96% 96% 98% 97%    Intake/Output Summary (Last 24 hours) at 05/14/11 1311 Last data filed at 05/14/11 0630  Gross per 24 hour  Intake   3949 ml  Output   1000 ml  Net   2949 ml    Exam: Gen:  NAD Cardiovascular:  RRR, No M/R/G Respiratory: Lungs CTAB Gastrointestinal: Abdomen very distended with sluggish bowel sounds. Extremities: No C/E/C    Data Reviewed: Basic Metabolic Panel:  Lab 05/14/11 9604 05/13/11 0625 05/12/11 0406 05/11/11 0406 05/10/11 0406 05/09/11 0630 05/08/11 0415  NA 132* 130* 132* 135 135 -- --  K 3.1* 3.4* -- -- -- -- --  CL 95* 90* 92* 96 98 -- --  CO2 27 29 25 27 25  -- --  GLUCOSE 121* 127* 125* 120* 115* -- --  BUN 33* 40* 35* 29* 22 -- --  CREATININE 1.37* 1.58* 1.38* 1.21* 1.22* -- --  CALCIUM 8.7 8.6 9.3 9.2 9.2 -- --  MG 2.2 -- -- 2.2 -- 1.9 1.8  PHOS 3.1 -- 4.7* 4.7* -- 4.6 4.4   GFR Estimated Creatinine Clearance: 60.9 ml/min (by C-G formula based on Cr of 1.37). Liver Function Tests:  Lab 05/14/11 0600 05/11/11 0406 05/08/11 0415  AST 43* 78* 35  ALT 63* 89* 34  ALKPHOS 115 121* 106  BILITOT 0.7 0.6 0.6  PROT 6.9 7.0 6.9  ALBUMIN 2.5* 2.9* 2.9*    CBC:  Lab 05/12/11 0406 05/11/11 0406 05/09/11 0630 05/08/11 0415  WBC 5.6 5.8 6.0 2.2*  NEUTROABS -- -- -- 1.3*  HGB 9.5* 9.4* 9.1* 8.7*  HCT  30.5* 31.0* 29.3* 28.3*  MCV 82.9 82.9 82.3 83.0  PLT 267 213 134* 115*   Microbiology No results found for this or any previous visit (from the past 240 hour(s)).  Procedures and Diagnostic Studies:  Dg Abd 1 View 05/06/2011 IMPRESSION: Worsening dilatation of the small intestine consistent with worsening partial small bowel obstruction.  Original Report Authenticated By: Thomasenia Sales, M.D.    Dg Abd 1 View 05/03/2011 IMPRESSION:  1.  Persistent retained contrast.  Last contrast exam 04/29/2011. 2.  Persistent dilatation of small bowel loops, consistent with at least partial small bowel obstruction.  Original Report Authenticated By: Patterson Hammersmith, M.D.    Dg Abd 1 View 05/01/2011 IMPRESSION: Recurrent small bowel dilatation as demonstrated on recent CT and concerning for partial small-bowel obstruction, possibly on the basis of adhesions or peritoneal carcinomatosis.  Original Report Authenticated By: Gerrianne Scale, M.D.    Abd 1 View (kub) 04/30/2011  IMPRESSION: Normal stool and bowel gas pattern.  Original Report Authenticated By: Brandon Melnick, M.D.    Ct Abdomen  Pelvis W Contrast 04/29/2011 IMPRESSION: Dilated loops of small bowel in the left upper abdomen, compatible with at least early/partial small bowel obstruction, possibly on the basis of adhesions.  Additional suspected focal transition in the left mid abdomen.  Interval development of moderate abdominopelvic ascites with peritoneal nodularity, suggesting peritoneal disease.  Gastrohepatic and retroperitoneal nodal metastases, as described above.  Diminished enhancement of the left kidney relative to the right with mild left hydronephrosis, possibly on the basis of extrinsic compression on the mid/lower ureter by peritoneal disease.  Midline lower pelvic ventral hernia containing a loop of small bowel. Original Report Authenticated By: Charline Bills, M.D.     Dg Chest Port 1 View 05/03/2011  IMPRESSION: Extremely low  lung volumes with bilateral lower lobe atelectasis and possible retrocardiac opacity.  PA and lateral chest radiograph at full inspiration are recommended when the patient is clinically able, for better evaluation.  Original Report Authenticated By: Harrel Lemon, M.D.    Dg Abd Acute W/chest 05/04/2011 IMPRESSION: Bowel gas pattern suggests residual partial small bowel obstruction.  Original Report Authenticated By: Reyes Ivan, M.D.    Dg Abd 1 View 05/13/2011  IMPRESSION: Persistent small bowel obstruction.  Some barium does remain in the fundus of the stomach in the supine position.  Original Report Authenticated By: Juline Patch, M.D.    Dg Abd 1 View 05/12/2011 IMPRESSION: NG tube tip in the antrum of the stomach.  There is little change in gaseous distention of small bowel loops.  Original Report Authenticated By: Juline Patch, M.D.    Ir Basil Dess Tube Plc W/fl W/rad 05/12/2011 The patient is scheduled for a percutaneous gastrostomy tube. However, there is barium within the stomach which is not ideal for placement of a gastrostomy tube.  As a result, a nasogastric tube was placed in order to remove the barium and decompress the dilated bowel loops.  The patient will be evaluated again on May 13, 2011 for the gastrostomy tube placement.  Original Report Authenticated By: Richarda Overlie, M.D.    Ir Paracentesis 05/12/2011  Findings:Large amount of ascites.  2 liters of fluid was removed. Barium within the stomach and dilated small bowel loops. Gas-filled structures throughout the abdomen may represent colon and small bowel loops.  Complications: None  Impression:Successful ultrasound guided paracentesis.  2 liters of fluid was removed.  The patient is scheduled for a percutaneous gastrostomy tube. However, there is barium within the stomach which is not ideal for placement of a gastrostomy tube.  As a result, a nasogastric tube was placed in order to remove the barium and decompress the dilated  bowel loops.  The patient will be evaluated again on May 13, 2011 for the gastrostomy tube placement.  Original Report Authenticated By: Richarda Overlie, M.D.    Dg Abd 1 View 05/14/11: IMPRESSION: Barium is in the colon. G tube can be performed.   Scheduled Meds:    . bisacodyl  10 mg Rectal TID  . cloNIDine  0.2 mg Transdermal Weekly  . enoxaparin  40 mg Subcutaneous Q24H  . insulin aspart  0-9 Units Subcutaneous Q4H  . piperacillin-tazobactam (ZOSYN)  IV  3.375 g Intravenous Q8H  . potassium chloride  10 mEq Intravenous Q1 Hr x 4  . sodium phosphate  1 enema Rectal Once   Continuous Infusions:    . TPN (CLINIMIX) +/- additives 80 mL/hr at 05/13/11 0538   And  . fat emulsion 250 mL (05/13/11 0538)  . TPN (CLINIMIX) +/-  additives 80 mL/hr at 05/14/11 0630   And  . fat emulsion 250 mL (05/13/11 1804)  . TPN (CLINIMIX) +/- additives     And  . fat emulsion    . 0.9 % sodium chloride with kcl 75 mL/hr at 05/14/11 0630      LOS: 15 days   Hillery Aldo, MD Pager [phone]  05/14/2011, 1:11 PM  Patient teaching/information given to the patient and reviewed with the patient:  Krystal [name] 05/14/2011  Treatment team:  Dr. Hillery Aldo, Hospitalist (Internist)  Dr. Jama Flavors, Oncologist  Dr. Lodema Pilot (and partners), Surgeon  Dr. Deanne Coffer, Interventional radiologist  Medical Issues and plan: Principal Problem:  *Partial blockage of the bowel  Surgical consultation performed by Dr. Lodema Pilot on 05/03/11: Surgical intervention felt to be only used as a last resort for complete bowel obstruction. The surgeons feel that surgery would be very risky and likely not solve the underlying problem.  Treated conservatively with bowel rest and NG tube decompression of the stomach.  So far, have not been able to place G-tube do to retained barium in the stomach which we are hoping will clear soon.  G-tube placement planned for today. Active Problems:  Endometrial  cancer  Status post chemotherapy.  This is a very aggressive form of cancer. Right now, Dr. Darrold Span feels you are too weak to undergo chemotherapy and that we would need to have a intermittent/partial bowel obstruction issues more resolved before you undergo further chemotherapy.  The palliative care doctor's will come and talk to you and your family about goals of care and her CODE STATUS.  A CODE STATUS is a directive that tells healthcare workers what interventions you might want or not want in the event of a medical emergency that is life threatening. Low white cells and platelets  Your blood counts are now normal. Chronic constipation  Senokot S. and Dulcolax suppositories have been ordered. Inadequate oral intake  TNA. Started. This is a temporary treatment for malnourishment. High blood pressure  Started on a clonidine patch 05/06/2011 with subsequent good blood pressure control.  Was on Norvasc as an outpatient, but this is on hold since you have an NG tube in. Mild dehydration  Continue IV fluids. Low red cells  Likely from anemia of chronic disease and the sequela of chemotherapy.  Hemoglobin stable.  Anticipated discharge date: Depends on progress.

## 2011-05-14 NOTE — Progress Notes (Signed)
05/14/2011, 8:46 AM  Hospital day: 16 Antibiotics: zosyn day 3 Chemotherapy: day 18 cycle 1 adria/ cisplatin    Subjective: Patient seen, with children's husband here; father and brother arrived yesterday and mother should arrive in [place] this AM. (Family speaks [place] with a little [place]). She is very uncomfortable with abdominal fullness when NG is not working well, including with NG problems last pm and this morning. KUB just done to follow up barium in stomach for possible G tube today; Xray yesterday consistent with small bowel obstruction. She is having soft stool every time a suppository is given, and passing flatus frequently; will try enema to help clear lower bowel. Dr.Paola Gehrig reviewed all of EMR information yesterday tho surgery schedule did not allow her to see patient. She is in agreement with G tube placement and in agreement with further chemotherapy if patient wants to continue attempts to treat the endometrial cancer, as long as performance status is adequate. Patient tells me that she wants to continue to treat the cancer, and would be grateful for even a few months to help her mother as mother assumes care of the children. She does not want hospice involved yet, and I have cancelled that discussion including cancelling order in EMR and speaking with liason RN Margie by phone (discussion was scheduled with [place] interpreter for tomorrow). Patient would not want to be on life support in an irreversible situation, but short of that is willing to try any intervention that might improve the cancer and prolong her life.  She tells me that the children were upset when they came to hospital to visit yesterday and I have encouraged her to use [facility] even if she is not with hospice (which [facility] SW has been encouraging also).   Objective: Vital signs in last 24 hours: Blood pressure 108/76, pulse 94, temperature 98.9 F (37.2 C), temperature source Oral, resp. rate 18, height  5\' 6"  (1.676 m), weight 205 lb (92.987 kg), last menstrual period 07/02/2010, SpO2 97.00%. Awake, alert, mildly uncomfortable and NG not clearly draining on intermittent suction now. Mouth clear and moist. PAC site fine, TNA and IVF infusing. Lungs clear. Cor RRR. Abd somewhat distended but not tight, quiet. LE no edema. Moves easily in bed.  Intake/Output from previous day: 05/01 0701 - 05/02 0700 In: 3979 [I.V.:1584; NG/GT:30; IV Piggyback:239; TPN:2126] Out: 1000 [Emesis/NG output:1000] Intake/Output this shift:    Lab Results:  Basename 05/12/11 0406  WBC 5.6  HGB 9.5*  HCT 30.5*  PLT 267   BMET  Basename 05/14/11 0600 05/13/11 0625  NA 132* 130*  K 3.1* 3.4*  CL 95* 90*  CO2 27 29  GLUCOSE 121* 127*  BUN 33* 40*  CREATININE 1.37* 1.58*  CALCIUM 8.7 8.6   Magnesium 2.2 Studies/Results: Dg Abd 1 View  05/13/2011  *RADIOLOGY REPORT*  Clinical Data: Small bowel obstruction, follow-up  ABDOMEN - 1 VIEW  Comparison: Abdomen film of 05/12/2011  Findings: There are persistently dilated loops of small bowel consistent with small bowel obstruction.  Very little colonic bowel gas is seen.  An NG tube is present and there is a small amount of barium remaining within the fundus of the stomach in the supine position. An NG tube is present with the tip in the region of the descending duodenum.  IMPRESSION: Persistent small bowel obstruction.  Some barium does remain in the fundus of the stomach in the supine position.  Original Report Authenticated By: Juline Patch, M.D.   Dg Abd  1 View  05/12/2011  *RADIOLOGY REPORT*  Clinical Data: Evaluate NG tube placement, vomiting  ABDOMEN - 1 VIEW  Comparison: Abdomen film of 05/11/2011  Findings: The NG tube coils in the fundus of the stomach with the tip extending into the antrum of the stomach.  Some contrast layers within the fundus of the stomach.  Dilated small bowel loops again are noted, and there is left basilar atelectasis present.   IMPRESSION: NG tube tip in the antrum of the stomach.  There is little change in gaseous distention of small bowel loops.  Original Report Authenticated By: Juline Patch, M.D.   Ir Basil Dess Tube Plc W/fl W/rad  05/12/2011  *RADIOLOGY REPORT*  Clinical history:41 year old with metastatic endometrial cancer. The patient has a small bowel obstruction and a gastrostomy tube has been requested for bowel decompression.  PROCEDURE(S): ULTRASOUND GUIDED PARACENTESIS; FLUOROSCOPIC GUIDED PLACEMENT OF NASOGASTRIC TUBE  Physician: Rachelle Hora. Lowella Dandy, MD  Medications:None  Moderate sedation time:None  Fluoroscopy time: 0.9 minutes  Procedure:Informed consent was obtained for a paracentesis and gastrostomy tube placement.  The patient was evaluated with ultrasound and fluoroscopy.  Ultrasound demonstrated a large amount of abdominal ascites.  Fluoroscopy demonstrated barium within the stomach with dilated loops of small bowel.  The right side of the abdomen was prepped and draped in a sterile fashion. The skin was anesthetized with lidocaine.  Using ultrasound guidance, a 5-French Yueh catheter was directed into the peritoneal fluid.  2 liters of yellowish-green ascites was removed.  The right nostril was anesthetized with viscous lidocaine.  14- French nasogastric tube was inserted and advanced in the stomach. Positioning was confirmed with fluoroscopy.  The tube was secured to the nose.  Findings:Large amount of ascites.  2 liters of fluid was removed. Barium within the stomach and dilated small bowel loops. Gas-filled structures throughout the abdomen may represent colon and small bowel loops.  Complications: None  Impression:Successful ultrasound guided paracentesis.  2 liters of fluid was removed.  The patient is scheduled for a percutaneous gastrostomy tube. However, there is barium within the stomach which is not ideal for placement of a gastrostomy tube.  As a result, a nasogastric tube was placed in order to remove the  barium and decompress the dilated bowel loops.  The patient will be evaluated again on May 13, 2011 for the gastrostomy tube placement.  Original Report Authenticated By: Richarda Overlie, M.D.   Dg Abd Portable 1v  05/14/2011  *RADIOLOGY REPORT*  Clinical Data: NG tube placement.  Evaluate for barium content  PORTABLE ABDOMEN - 1 VIEW  Comparison: Yesterday  Findings: Barium is seen throughout the colon.  No significant barium in the stomach. No visualized barium filled small bowel loops.  IMPRESSION: Barium is in the colon.  G tube can be performed.  Original Report Authenticated By: Donavan Burnet, M.D.   Dg Naso G Tube Plc W/fl W/rad  05/13/2011  *RADIOLOGY REPORT*  Clinical Data: Check NG tube  NASO G TUEB PLACEMENT WITH FL AND WITH RAD  Technique: Bedside spot image with the C-arm performed.  Comparison:  None.  Findings: NG tube tip is in the fundus of the stomach.  IMPRESSION: NG tube tip is in the fundus of the stomach.  Original Report Authenticated By: Donavan Burnet, M.D.   Ir Paracentesis  05/12/2011  *RADIOLOGY REPORT*  Clinical history:41 year old with metastatic endometrial cancer. The patient has a small bowel obstruction and a gastrostomy tube has been requested for bowel decompression.  PROCEDURE(S): ULTRASOUND  GUIDED PARACENTESIS; FLUOROSCOPIC GUIDED PLACEMENT OF NASOGASTRIC TUBE  Physician: Rachelle Hora. Lowella Dandy, MD  Medications:None  Moderate sedation time:None  Fluoroscopy time: 0.9 minutes  Procedure:Informed consent was obtained for a paracentesis and gastrostomy tube placement.  The patient was evaluated with ultrasound and fluoroscopy.  Ultrasound demonstrated a large amount of abdominal ascites.  Fluoroscopy demonstrated barium within the stomach with dilated loops of small bowel.  The right side of the abdomen was prepped and draped in a sterile fashion. The skin was anesthetized with lidocaine.  Using ultrasound guidance, a 5-French Yueh catheter was directed into the peritoneal fluid.  2 liters  of yellowish-green ascites was removed.  The right nostril was anesthetized with viscous lidocaine.  14- French nasogastric tube was inserted and advanced in the stomach. Positioning was confirmed with fluoroscopy.  The tube was secured to the nose.  Findings:Large amount of ascites.  2 liters of fluid was removed. Barium within the stomach and dilated small bowel loops. Gas-filled structures throughout the abdomen may represent colon and small bowel loops.  Complications: None  Impression:Successful ultrasound guided paracentesis.  2 liters of fluid was removed.  The patient is scheduled for a percutaneous gastrostomy tube. However, there is barium within the stomach which is not ideal for placement of a gastrostomy tube.  As a result, a nasogastric tube was placed in order to remove the barium and decompress the dilated bowel loops.  The patient will be evaluated again on May 13, 2011 for the gastrostomy tube placement.  Original Report Authenticated By: Richarda Overlie, M.D.    Abd Xray today just finalized, with barium thru colon and none in stomach.  Assessment/Plan: 1. 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 Recurrence documented by CT/PET in late March 2013. She had first cycle cisplatin/adria 04-27-2011 and has now been hospitalized since 4-17 with bowel obstruction. Patient wants to continue to treat the cancer if possible. Gyn oncology in agreement with G tube placement and further chemotherapy. 2.PAC in 3.hypokalemia with NG suction/vomiting and recent cisplatin: K runs ordered by pharmacy 4.severe chronic constipation since childhood: has not used any narcotics in 48 hrs. Will add enemas. If no obstruction on plain Xray, perhaps could try IV reglan or ask GI if any other meds (relistor, other?) might be worth trying. (Note even her young daughters  need regular miralax)   Genavie Boettger P

## 2011-05-14 NOTE — Progress Notes (Signed)
Will see prn for now, agree with plan, if fails then surgery would be next option but this is last

## 2011-05-14 NOTE — Progress Notes (Signed)
Patient ID: Krystal [name], female   DOB: 06/05/1970, 41 y.o.   MRN: [id number]    Subjective: Nothing to add. Pt is to have gastrostomy placed Objective: Vital signs in last 24 hours: Temp:  [98.2 F (36.8 C)-98.9 F (37.2 C)] 98.9 F (37.2 C) (05/02 0438) Pulse Rate:  [94-96] 94  (05/02 0438) Resp:  [17-18] 18  (05/02 0438) BP: (108-114)/(75-84) 108/76 mmHg (05/02 0438) SpO2:  [96 %-98 %] 97 % (05/02 0438) Last BM Date: 05/12/11  Intake/Output from previous day: 05/01 0701 - 05/02 0700 In: 3979 [I.V.:1584; NG/GT:30; IV Piggyback:239; TPN:2126] Out: 1000 [Emesis/NG output:1000] Intake/Output this shift:     Lab Results:   Basename 05/12/11 0406  WBC 5.6  HGB 9.5*  HCT 30.5*  PLT 267   BMET  Basename 05/14/11 0600 05/13/11 0625  NA 132* 130*  K 3.1* 3.4*  CL 95* 90*  CO2 27 29  GLUCOSE 121* 127*  BUN 33* 40*  CREATININE 1.37* 1.58*  CALCIUM 8.7 8.6    Assessment/Plan: Malignant bowel obstruction- Continue conservative management and hopefully can get gastrostomy tube today.  Will sign off as nothing to add at this point.  Please reconsult prn.    LOS: 15 days    Laker Thompson,PA-C Pager [phone] General Trauma Pager [phone]

## 2011-05-14 NOTE — Progress Notes (Signed)
PARENTERAL NUTRITION CONSULT NOTE - Follow-Up  Pharmacy Consult for TNA Indication: pSBO  No Known Allergies  Patient Measurements: Height: 5\' 6"  (167.6 cm) Weight: 205 lb (92.987 kg) IBW/kg (Calculated) : 59.3  Adjusted Body Weight: 69 kg  Vital Signs: Temp: 98.9 F (37.2 C) (05/02 0438) Temp src: Oral (05/02 0438) BP: 108/76 mmHg (05/02 0438) Pulse Rate: 94  (05/02 0438) Intake/Output from previous day: 05/01 0701 - 05/02 0700 In: 3979 [I.V.:1584; NG/GT:30; IV Piggyback:239; TPN:2126] Out: 1000 [Emesis/NG output:1000] Intake/Output from this shift:    Labs:  [facility] 05/12/11 0406  WBC 5.6  HGB 9.5*  HCT 30.5*  PLT 267  APTT 29  INR 0.93     Basename 05/14/11 0600 05/13/11 0625 05/12/11 0406  NA 132* 130* 132*  K 3.1* 3.4* 3.6  CL 95* 90* 92*  CO2 27 29 25   GLUCOSE 121* 127* 125*  BUN 33* 40* 35*  CREATININE 1.37* 1.58* 1.38*  LABCREA -- -- --  CREAT24HRUR -- -- --  CALCIUM 8.7 8.6 9.3  MG 2.2 -- --  PHOS 3.1 -- 4.7*  PROT 6.9 -- --  ALBUMIN 2.5* -- --  AST 43* -- --  ALT 63* -- --  ALKPHOS 115 -- --  BILITOT 0.7 -- --  BILIDIR -- -- --  IBILI -- -- --  PREALBUMIN -- -- --  TRIG -- -- --  CHOLHDL -- -- --  CHOL -- -- --   Estimated Creatinine Clearance: 60.9 ml/min (by C-G formula based on Cr of 1.37).    Basename 05/14/11 0431 05/14/11 0014 05/13/11 2013  GLUCAP 115* 121* 127*    Medical History: Past Medical History  Diagnosis Date  . Hypertension     SINCE FIRST PREGNANCY  . Asthma   . Constipation   . History of miscarriage   . Preeclampsia   . Postoperative ileus 07/2010  . Iron deficiency anemia     s/p transfusions [facility]  . Uterine cancer     Medications:  Scheduled:     . bisacodyl  10 mg Rectal TID  . cloNIDine  0.2 mg Transdermal Weekly  . enoxaparin  40 mg Subcutaneous Q24H  . insulin aspart  0-9 Units Subcutaneous Q4H  . piperacillin-tazobactam (ZOSYN)  IV  3.375 g Intravenous Q8H  . potassium chloride  10 mEq  Intravenous Q1 Hr x 2  . potassium chloride  10 mEq Intravenous Q1 Hr x 4  . DISCONTD: lactulose  30 g Oral TID  . DISCONTD: senna-docusate  3 tablet Oral TID   Infusions:     . TPN (CLINIMIX) +/- additives 80 mL/hr at 05/13/11 0538   And  . fat emulsion 250 mL (05/13/11 0538)  . TPN (CLINIMIX) +/- additives 80 mL/hr at 05/14/11 0630   And  . fat emulsion 250 mL (05/13/11 1804)  . 0.9 % sodium chloride with kcl 75 mL/hr at 05/14/11 0630    Insulin Requirements in the past 24 hours:  CBG 132 126 127 121 115, 4 units SSI used  Current Nutrition:  NPO  Clinimix 5/15 at 80 mL/hr thru [facility] Fat Emulsion 20% at 10 mL/hr NS w/ KCl/L @ 73ml/hr  Nutritional Goals:  Per RD: Kcal goals: 1750-2050/day, Protein 70-90g/day, Fluid 1.7-2L/day Clinimix 5/15: goal rate 80 ml/hr (1926ml/day) = 1843kcal/day and 96g protein/day  Assessment:  41 yo female with past medical history of endometrial cancer admitted on 4/17 with abdominal cramping and a partial SBO. No current plans for surgical intervention, being managed non  surgically  NG out d/t vomiting yesterday, to be replaced by IR. G tube today for decomression  TNA was started 4/25 via Port-a-cath, increased to goal 4/28. Started wean 4/29 but unable to tolerate PO so increased back to goal 4/30. Currently on electrolyte-free formula d/t elevated Phos.  CBG within goal <150  Chemistires: Na low, K low, CO2 low, Scr was trending up but down some now. Phos down wnl  LFTs slightly increased  Lipids wnl (TG 141, 4/29). Today's lipid panel pending.  Prealbumin - 9.7 4/26, 11.8 4/29  Plan:   Continue Clinimix 5/15, electrolyte free forumula at goal rate  Continue Lipids daily at 10 ml/hr  Replace K w/ KCL runs x 4 ( each)  Continue MIVF w/NS w/49meq KCl per liter  Due to [employer], adding MVI on MWF only  Lab: BMET  Gwen Her PharmD  [phone] 05/14/2011 7:12 AM

## 2011-05-15 ENCOUNTER — Other Ambulatory Visit: Payer: Self-pay | Admitting: Diagnostic Radiology

## 2011-05-15 DIAGNOSIS — E43 Unspecified severe protein-calorie malnutrition: Secondary | ICD-10-CM

## 2011-05-15 DIAGNOSIS — C549 Malignant neoplasm of corpus uteri, unspecified: Secondary | ICD-10-CM

## 2011-05-15 DIAGNOSIS — R112 Nausea with vomiting, unspecified: Secondary | ICD-10-CM

## 2011-05-15 DIAGNOSIS — K56609 Unspecified intestinal obstruction, unspecified as to partial versus complete obstruction: Secondary | ICD-10-CM

## 2011-05-15 LAB — GLUCOSE, CAPILLARY
Glucose-Capillary: 104 mg/dL — ABNORMAL HIGH (ref 70–99)
Glucose-Capillary: 123 mg/dL — ABNORMAL HIGH (ref 70–99)
Glucose-Capillary: 121 mg/dL — ABNORMAL HIGH (ref 70–99)
Glucose-Capillary: 91 mg/dL (ref 70–99)

## 2011-05-15 LAB — BASIC METABOLIC PANEL
CO2: 26 mEq/L (ref 19–32)
GFR calc non Af Amer: 49 mL/min — ABNORMAL LOW (ref >90)
Glucose, Bld: 109 mg/dL — ABNORMAL HIGH (ref 70–99)
Potassium: 3.5 mEq/L (ref 3.5–5.1)
Sodium: 133 mEq/L — ABNORMAL LOW (ref 135–145)

## 2011-05-15 MED ORDER — M.V.I. ADULT IV INJ
INJECTION | INTRAVENOUS | Status: DC
  Filled 2011-05-15: qty 2000

## 2011-05-15 MED ORDER — SODIUM CHLORIDE 0.9 % IV SOLN
INTRAVENOUS | Status: DC
  Administered 2011-05-15 – 2011-05-16 (×2): via INTRAVENOUS
  Filled 2011-05-15 (×5): qty 1000

## 2011-05-15 MED ORDER — TRACE MINERALS CR-CU-MN-SE-ZN 10-1000-500-60 MCG/ML IV SOLN
INTRAVENOUS | Status: AC
  Administered 2011-05-15: 18:00:00 via INTRAVENOUS
  Filled 2011-05-15: qty 2000

## 2011-05-15 MED ORDER — PROMETHAZINE HCL 25 MG/ML IJ SOLN
25.0000 mg | Freq: Four times a day (QID) | INTRAMUSCULAR | Status: DC | PRN
  Administered 2011-05-15 – 2011-05-18 (×4): 25 mg via INTRAVENOUS
  Filled 2011-05-15 (×4): qty 1

## 2011-05-15 MED ORDER — FAT EMULSION 20 % IV EMUL
250.0000 mL | INTRAVENOUS | Status: DC
  Filled 2011-05-15: qty 250

## 2011-05-15 MED ORDER — FAT EMULSION 20 % IV EMUL
250.0000 mL | INTRAVENOUS | Status: AC
  Administered 2011-05-15: 250 mL via INTRAVENOUS
  Filled 2011-05-15: qty 250

## 2011-05-15 NOTE — Progress Notes (Signed)
ANTIBIOTIC CONSULT NOTE - Follow Up  Pharmacy Consult for Zosyn Indication: Fever, unknown site of infection  No Known Allergies  Patient Measurements: Height: 5\' 6"  (167.6 cm) Weight: 205 lb (92.987 kg) IBW/kg (Calculated) : 59.3   Vital Signs: Temp: 99.3 F (37.4 C) (05/03 0417) Temp src: Oral (05/03 0417) BP: 109/77 mmHg (05/03 0417) Pulse Rate: 89  (05/03 0417) Intake/Output from previous day: 05/02 0701 - 05/03 0700 In: 4295 [I.V.:1916; IV Piggyback:202; TPN:2177] Out: 600 [Drains:600] Intake/Output from this shift:    Labs:  Basename 05/15/11 0540 05/14/11 0600 05/13/11 0625  WBC -- -- --  HGB -- -- --  PLT -- -- --  LABCREA -- -- --  CREATININE 1.32* 1.37* 1.58*   Estimated Creatinine Clearance: 63.2 ml/min (by C-G formula based on Cr of 1.32).  Microbiology: Recent Results (from the past 720 hour(s))  URINE CULTURE     Status: Normal   Collection Time   04/29/11 10:17 PM      Component Value Range Status Comment   Specimen Description URINE, CLEAN CATCH   Final    Special Requests none   Final    Culture  Setup Time [id number]   Final    Colony Count NO GROWTH   Final    Culture NO GROWTH   Final    Report Status 04/30/2011 FINAL   Final   URINE CULTURE     Status: Normal   Collection Time   05/03/11 10:27 PM      Component Value Range Status Comment   Specimen Description URINE, RANDOM   Final    Special Requests NONE   Final    Culture  Setup Time [id number]   Final    Colony Count 25,000 COLONIES/ML   Final    Culture     Final    Value: Multiple bacterial morphotypes present, none predominant. Suggest appropriate recollection if clinically indicated.   Report Status 05/05/2011 FINAL   Final   CULTURE, BLOOD (ROUTINE X 2)     Status: Normal   Collection Time   05/03/11 11:10 PM      Component Value Range Status Comment   Specimen Description BLOOD RIGHT [facility]   Final    Special Requests BOTTLES DRAWN AEROBIC AND ANAEROBIC 5CC   Final    Culture  Setup Time [id number]   Final    Culture NO GROWTH 5 DAYS   Final    Report Status 05/10/2011 FINAL   Final     Medical History: Past Medical History  Diagnosis Date  . Hypertension     SINCE FIRST PREGNANCY  . Asthma   . Constipation   . History of miscarriage   . Preeclampsia   . Postoperative ileus 07/2010  . Iron deficiency anemia     s/p transfusions [facility]  . Uterine cancer     Assessment: 41 YOF D #4 Zosyn 3.375g IV q8h for Tm 100.5 (4/30) in setting of endometrial CA and bowel obstruction.   Pt now afebrile x 48h Scr up from baseline but now appears to be decreasing (0.94 > 1.58 > 1.32 )  Goal of Therapy:  dose adjusted per renal clearance  Plan:  Continue Zosyn 3.375g IV q8h (4 hour infusion time). F/u SCr. LOT per MD  Gwen Her PharmD  [phone] 05/15/2011 9:04 AM

## 2011-05-15 NOTE — Progress Notes (Signed)
05/15/2011, 10:04 AM  Hospital day: 17 Antibiotics: day 4 zosyn Chemotherapy: day 19 cycle 1 adriamycin/ cisplatin    Subjective: Patient seen, with mother, brother and Mr Pohlmann here; mother speaks only [place] and family has assisted with conversation. G tube placed yesterday, NG out. G tube has been kept to suction, with 600 out charted since placement (?). Patient was very comfortable last pm, no nausea, able to sleep well.  She had some stool with each suppository yesterday as well as a moderate amount with Fleets enema. I believe this is residual stool still in bowel below obstruction; with chronic bowel motility problems (since childhood) will continue suppositories and try enema again today as she should be more comfortable with this cleared. She is not too uncomfortable now with G tube, no drainage around it, no bleeding. Denies shortness of breath, other abdominal or pelvic pain. Back is uncomfortable in bed and she will try up to chair. I have reviewed course with patient and family now, including rapid recurrence of cancer after completion of adjuvant therapy in midJan, first CDDP/adria chemotherapy just prior to bowel obstruction, reasons for not doing surgery for the bowel obstruction or ventral hernia now, possible ways to manage G tube at home, her wishes to continue to try to treat disease. Patient and mother express strong faith and "want to continue to fight".  Objective: Vital signs in last 24 hours: Blood pressure 109/77, pulse 89, temperature 99.3 F (37.4 C), temperature source Oral, resp. rate 18, height 5\' 6"  (1.676 m), weight 205 lb (92.987 kg), last menstrual period 07/02/2010, SpO2 98.00%. Awake, alert, looks mildly uncomfortable in bed & moves legs restlessly. G tube draining clear dark yellow fluid, dressing dry. PERRL, not icteric. Mouth clear and moist. Nares without blood. Lungs clear ant and post. PAC site fine, infusing TNA and IVF. Abdomen softer, quiet, not  clearly tender, ventral hernia easily reducible. LE no edema, cords.  Intake/Output from previous day: 05/02 0701 - 05/03 0700 In: 4295 [I.V.:1916; IV Piggyback:202; TPN:2177] Out: 600 [Drains:600] Intake/Output this shift:      Lab Results: No results found for this basename: WBC:2,HGB:2,HCT:2,PLT:2 in the last 72 hours BMET  Basename 05/15/11 0540 05/14/11 0600  NA 133* 132*  K 3.5 3.1*  CL 98 95*  CO2 26 27  GLUCOSE 109* 121*  BUN 29* 33*  CREATININE 1.32* 1.37*  CALCIUM 8.4 8.7    Studies/Results: Dg Abd Portable 1v  05/14/2011  *RADIOLOGY REPORT*  Clinical Data: NG tube placement.  Evaluate for barium content  PORTABLE ABDOMEN - 1 VIEW  Comparison: Yesterday  Findings: Barium is seen throughout the colon.  No significant barium in the stomach. No visualized barium filled small bowel loops.  IMPRESSION: Barium is in the colon.  G tube can be performed.  Original Report Authenticated By: Donavan Burnet, M.D.   Dg Naso G Tube Plc W/fl W/rad  05/13/2011  *RADIOLOGY REPORT*  Clinical Data: Check NG tube  NASO G TUEB PLACEMENT WITH FL AND WITH RAD  Technique: Bedside spot image with the C-arm performed.  Comparison:  None.  Findings: NG tube tip is in the fundus of the stomach.  IMPRESSION: NG tube tip is in the fundus of the stomach.  Original Report Authenticated By: Donavan Burnet, M.D.     Assessment/Plan: 1. Rapidly recurrent endometrial cancer, with bowel obstruction: now post G tube placement.  I have requested case manager assist and speak to me: will need supplies/ ? HH to manage G  tube at home (I assume to gravity drain). I do not know if there is any way to get other medical care (inclucing TNA) out of hospital, which I need to discuss directly with case manager. If she can go home weekend or first of week, I will set up visit and outpatient chemo at [facility]. Note gyn onc MDs in [place] and [place]. 2.PAC in 3.Chronic, severe constipation since childhood 4. Difficult  social situation: per [facility] SW, she is NOT eligible for medicaid since she is not [place] citizen. She does have some indigent coverage thru [facility] system, but I do not know what this can include. I will ask my partners to follow up this weekend.  Dajuan Turnley P

## 2011-05-15 NOTE — Progress Notes (Addendum)
Patient ID: Krystal [name], female   DOB: 1970/12/09, 41 y.o.   MRN: [id number]  Assessment/Plan:   Principal Problem:   *Partial small bowel obstruction  Surgical consultation performed by Dr. Lodema Pilot on 05/03/11: Surgical intervention felt to be only used as a last resort.  Treated conservatively with bowel rest and NG tube decompression of the stomach.  Having intermittent bowel movements and passing flatus at this time.  Continues to require nasogastric suction to prevent intractable nausea and vomiting. Dr. Deanne Coffer of interventional radiology consulted for placement of the G-tube. Attempted to place on 05/12/2011 but not successful due to retained barium in the stomach. Barium found to remain in stomach on 05/13/2011. Interventional radiologyplaced G-tube today, as based on KUB, barium has cleared.  TNA started for temporary nutritional purposes. Unable to wean yet.  Active Problems:   Endometrial ca / Ascites  Status post cycle 1 cisplatin/Adriamycin.  Rapidly progressive per Dr. Precious Reel notes.  Paracentesis done on 05/12/2011.   Not currently strong enough for further chemotherapy.   Neutropenia / thrombocytopenia  Related to prior chemotherapy.  Status post treatment with Neupogen, now discontinued.  No evidence of bleeding infection.  White blood cell count now normal and platelet count currently stable.  Chronic constipation  Unable to tolerate MiraLAX.  Senokot-S and Dulcolax suppositories 3 times a day ordered by oncologist.  Would defer to surgery regarding the question of the benefit of Reglan as this medication can cause bowel perforation and obstruction.  Given a fleets enema yesterday  Inadequate oral intake  Wean TNA and by mouth intake improved.  Hypertension  Started on a clonidine patch 05/06/2011 with subsequent good blood pressure control.  Was on Norvasc as an outpatient.  Acute renal failure/mild hydronephrosis  Creatinine 1.2 on admission, with  worsening creatinine 1 IV fluids discontinued.  Continue IV fluids.  Normocytic anemia  Likely from anemia of chronic disease and the sequela of chemotherapy.  Hemoglobin stable.  Syncope/Fever  Had a syncopal event on 05/12/2011.  Started on Zosyn empirically for concerns of sepsis.  IV fluids ordered for concerns of intravascular depletion.  Hypokalemia  IV fluids with potassium ordered. Potassium 3.5 today  Code Status: Full   Family Communication: Updated at bedside.   Disposition Plan: Home, when stable. Anticipate D/C Monday 05/18/2011  Medical Consultants:  Dr. [place] Flavors, Oncology  Dr. Lodema Pilot, Surgery  Dr. Deanne Coffer, Interventional radiology Other consultants:  Pharmacy Antibiotics:  Zosyn 05/12/2011--->   Subjective: No events overnight.   Objective:  Vital signs in last 24 hours:  Filed Vitals:   05/14/11 1700 05/14/11 2020 05/15/11 0417 05/15/11 1519  BP: 106/70 116/74 109/77 112/76  Pulse: 82 87 89 93  Temp: 97.9 F (36.6 C) 97.9 F (36.6 C) 99.3 F (37.4 C) 98.4 F (36.9 C)  TempSrc: Oral Axillary Oral Oral  Resp: 18 18 18 18   Height:      Weight:      SpO2: 99% 98% 98% 97%    Intake/Output from previous day:  Intake/Output Summary (Last 24 hours) at 05/15/11 1823 Last data filed at 05/15/11 1647  Gross per 24 hour  Intake   3840 ml  Output   1200 ml  Net   2640 ml    Physical Exam: General: Alert, awake, oriented x3, in no acute distress. HEENT: No bruits, no goiter. Moist mucous membranes, no scleral icterus, no conjunctival pallor. Heart: Regular rate and rhythm, S1/S2 +, no murmurs, rubs, gallops. Lungs: Clear to auscultation bilaterally. No  wheezing, no rhonchi, no rales.  Abdomen: distended but less than yesterday, slightly tender over upper abdomen. Extremities: No clubbing or cyanosis, no pitting edema,  positive pedal pulses. Neuro: Grossly nonfocal.  Lab Results:    Lab 05/12/11 0406 05/11/11 0406 05/09/11 0630    WBC 5.6 5.8 6.0  HGB 9.5* 9.4* 9.1*  HCT 30.5* 31.0* 29.3*  PLT 267 213 134*  MCV 82.9 82.9 82.3    Lab 05/15/11 0540 05/14/11 0600 05/13/11 0625 05/12/11 0406 05/11/11 0406  NA 133* 132* 130* 132* 135  K 3.5 3.1* 3.4* 3.6 3.6  CL 98 95* 90* 92* 96  CO2 26 27 29 25 27   GLUCOSE 109* 121* 127* 125* 120*  BUN 29* 33* 40* 35* 29*  CREATININE 1.32* 1.37* 1.58* 1.38* 1.21*  CALCIUM 8.4 8.7 8.6 9.3 9.2    Lab 05/12/11 0406  INR 0.93  PROTIME --    Studies/Results: Ir Gastrostomy Tube Mod Sed  05/15/2011  *RADIOLOGY REPORT*  Clinical history:Metastatic uterine cancer with a small bowel obstruction.  The patient needs a gastrostomy tube for long-term decompression.  PROCEDURE(S): PERCUTANEOUS GASTROSTOMY TUBE WITH FLUOROSCOPIC GUIDANCE  Physician: Rachelle Hora. Lowella Dandy, MD  Medications:Versed 6.0mg , Fentanyl 200 mcg, Ancef 1 gram  Moderate sedation time:25 minutes  Fluoroscopy time: 4.7 minutes  Procedure:Informed consent was obtained for a percutaneous gastrostomy tube.  The patient was placed on the interventional table.  Fluoroscopy demonstrated oral contrast in the transverse colon.  An orogastric tube was placed with fluoroscopic guidance. The anterior abdomen was prepped and draped in sterile fashion. Maximal barrier sterile technique was utilized including caps, mask, sterile gowns, sterile gloves, sterile drape, hand hygiene and skin antiseptic.  Stomach was inflated with air through the orogastric tube.  The skin and subcutaneous tissues were anesthetized with 1% lidocaine.  A 17 gauge needle was directed into the distended stomach with fluoroscopic guidance.  A wire was advanced into the stomach and a T-tact was deployed.  A 9-French vascular sheath was placed and the orogastric tube was snared using a Gooseneck snare device.  The orogastric tube and snare were pulled out of the patient's mouth.  The snare device was connected to a 20-French gastrostomy tube.  The snare device and gastrostomy tube  were pulled through the patient's mouth and out the anterior abdominal wall.  The gastrostomy tube was cut to an appropriate length.  Contrast injection through gastrostomy tube confirmed placement within the stomach.  Fluoroscopic images were obtained for documentation.  The gastrostomy tube was flushed with normal saline.  Findings:Gastrostomy tube within the stomach.  Complications: None  Impression:Successful fluoroscopic guided percutaneous gastrostomy tube placement.  Original Report Authenticated By: Richarda Overlie, M.D.   Dg Abd Portable 1v  05/14/2011  *RADIOLOGY REPORT*  Clinical Data: NG tube placement.  Evaluate for barium content  PORTABLE ABDOMEN - 1 VIEW  Comparison: Yesterday  Findings: Barium is seen throughout the colon.  No significant barium in the stomach. No visualized barium filled small bowel loops.  IMPRESSION: Barium is in the colon.  G tube can be performed.  Original Report Authenticated By: Donavan Burnet, M.D.    Medications: Scheduled Meds:   . bisacodyl  10 mg Rectal TID  . cloNIDine  0.2 mg Transdermal Weekly  . enoxaparin  40 mg Subcutaneous Q24H  . insulin aspart  0-9 Units Subcutaneous Q4H  . piperacillin-tazobactam (ZOSYN)  IV  3.375 g Intravenous Q8H   Continuous Infusions:   . TPN (CLINIMIX) +/- additives 80 mL/hr at  05/15/11 1610   And  . fat emulsion 250 mL (05/15/11 0637)  . TPN (CLINIMIX) +/- additives     And  . fat emulsion 250 mL (05/15/11 1742)  . 0.9 % sodium chloride with kcl 75 mL/hr at 05/15/11 1622  . DISCONTD: fat emulsion    . DISCONTD: 0.9 % sodium chloride with kcl 75 mL/hr at 05/15/11 0931  . DISCONTD: TPN (CLINIMIX) +/- additives     PRN Meds:.acetaminophen, albuterol, guaiFENesin-dextromethorphan, HYDROmorphone (DILAUDID) injection, HYDROmorphone (DILAUDID) injection, ipratropium, LORazepam, ondansetron (ZOFRAN) IV, oxyCODONE, phenol, promethazine, sodium phosphate, DISCONTD: ondansetron    LOS: 16 days   Taryll Reichenberger 05/15/2011,  6:23 PM  [facility] Pager: [phone]

## 2011-05-15 NOTE — Progress Notes (Signed)
CSW met with patient and family at bedside today. Ms. Restivo mother in room and patient reports finding much comfort in having her mother here in [place].  She states her children are very happy and they are ready for her to come home.  The patient reports her surgery went well and her main goal at this time is to rest and hopefully gain strength.  She is still concerned for the emotional well-being of her daughters.  CSW validated patient's feelings and discussed ways to support her children at this time.  CSW also informed patient that the children are on the waiting list at [facility].  CSW and patient discussed her durable living will and life insurance policy.  Ms. Warzecha has entrusted her friend Dewitt Rota [phone]) in helping her create a will and appeal her life insurance policy denial.  CSW contacted [employer] and left message with [employer] to determine if they would be able to provide services to assist Ms. Threat. CSW will continue to follow patient throughout discharge and in outpatient setting.  Kathrin Penner, MSW, [facility] Clinical Social Worker [facility] [phone]

## 2011-05-15 NOTE — Progress Notes (Addendum)
PARENTERAL NUTRITION CONSULT NOTE - Follow-Up  Pharmacy Consult for TNA Indication: pSBO  No Known Allergies  Patient Measurements: Height: 5\' 6"  (167.6 cm) Weight: 205 lb (92.987 kg) IBW/kg (Calculated) : 59.3  Adjusted Body Weight: 69 kg  Vital Signs: Temp: 99.3 F (37.4 C) (05/03 0417) Temp src: Oral (05/03 0417) BP: 109/77 mmHg (05/03 0417) Pulse Rate: 89  (05/03 0417) Intake/Output from previous day: 05/02 0701 - 05/03 0700 In: 4295 [I.V.:1916; IV Piggyback:202; TPN:2177] Out: 600 [Drains:600] Intake/Output from this shift:    Labs: No results found for this basename: WBC:3,HGB:3,HCT:3,PLT:3,APTT:3,INR:3 in the last 72 hours   Basename 05/15/11 0540 05/14/11 0600 05/13/11 0625  NA 133* 132* 130*  K 3.5 3.1* 3.4*  CL 98 95* 90*  CO2 26 27 29   GLUCOSE 109* 121* 127*  BUN 29* 33* 40*  CREATININE 1.32* 1.37* 1.58*  LABCREA -- -- --  CREAT24HRUR -- -- --  CALCIUM 8.4 8.7 8.6  MG -- 2.2 --  PHOS -- 3.1 --  PROT -- 6.9 --  ALBUMIN -- 2.5* --  AST -- 43* --  ALT -- 63* --  ALKPHOS -- 115 --  BILITOT -- 0.7 --  BILIDIR -- -- --  IBILI -- -- --  PREALBUMIN -- -- --  TRIG -- -- --  CHOLHDL -- -- --  CHOL -- -- --   Estimated Creatinine Clearance: 63.2 ml/min (by C-G formula based on Cr of 1.32).    Basename 05/15/11 0750 05/15/11 0414 05/14/11 2358  GLUCAP 123* 112* 104*    Medical History: Past Medical History  Diagnosis Date  . Hypertension     SINCE FIRST PREGNANCY  . Asthma   . Constipation   . History of miscarriage   . Preeclampsia   . Postoperative ileus 07/2010  . Iron deficiency anemia     s/p transfusions [facility]  . Uterine cancer     Medications:  Scheduled:     . bisacodyl  10 mg Rectal TID  . cloNIDine  0.2 mg Transdermal Weekly  . enoxaparin  40 mg Subcutaneous Q24H  . insulin aspart  0-9 Units Subcutaneous Q4H  . piperacillin-tazobactam (ZOSYN)  IV  3.375 g Intravenous Q8H  . potassium chloride  10 mEq Intravenous Q1 Hr x 4    . sodium phosphate  1 enema Rectal Once   Infusions:     . TPN (CLINIMIX) +/- additives 80 mL/hr at 05/14/11 0630   And  . fat emulsion 250 mL (05/13/11 1804)  . TPN (CLINIMIX) +/- additives 80 mL/hr at 05/15/11 0637   And  . fat emulsion 250 mL (05/15/11 0637)  . 0.9 % sodium chloride with kcl 75 mL/hr at 05/15/11 0637    Insulin Requirements in the past 24 hours:  CBG 101 108 108 104 112 123, 1 unit used  Current Nutrition:  NPO  Clinimix 5/15 at 80 mL/hr thru [facility] Fat Emulsion 20% at 10 mL/hr NS w/ KCl/L @ 35ml/hr  Nutritional Goals:  Per RD: Kcal goals: 1750-2050/day, Protein 70-90g/day, Fluid 1.7-2L/day Clinimix 5/15: goal rate 80 ml/hr (1924ml/day) = 1843kcal/day and 96g protein/day  Assessment:  41 yo female with past medical history of endometrial cancer admitted on 4/17 with abdominal cramping and a partial SBO. No current plans for surgical intervention, being managed non surgically  G tube placed yesterday  TNA was started 4/25 via Port-a-cath, increased to goal 4/28. Started wean 4/29 but unable to tolerate PO so increased back to goal 4/30. Currently on electrolyte-free  formula d/t elevated Phos.  CBG within goal <150  Chemistires: Na low but trending up, K up to low end nl, CO2 now wnl, Scr was trending up but now down. Phos down wnl (5/2)  LFTs slightly increased  Lipids wnl (TG 141, 4/29).  Prealbumin - 9.7 4/26, 11.8 4/29  Plan:   Continue Clinimix 5/15, electrolyte free forumula at goal rate  Continue Lipids daily at 10 ml/hr  Change MIVF to NS w/ KCl/L @ same rate (65ml/hr)  Due to national shortages, adding MVI on MWF only  Lab: BMET and Phos in AM  Gwen Her PharmD  [phone] 05/15/2011 8:44 AM    Addendum:   Asked to change to cyclic TNA tonight. Will run over 16 hours with 1 hour ramp up and 1 hour ramp down. Will f/u elytes in AM Transition to 12 hour cyclic as tolerated. Plan is for pt to be d/c on TNA  tomorrow (further cyclic titration OP).  Gwen Her PharmD  [phone] 05/15/2011 3:40 PM

## 2011-05-15 NOTE — Progress Notes (Signed)
Subjective: Slept well last night and minimal abdominal pain or distention.  Overall, abdomen feels better since gastrostomy placement.  Objective: Vital signs in last 24 hours: Temp:  [97.9 F (36.6 C)-99.3 F (37.4 C)] 99.3 F (37.4 C) (05/03 0417) Pulse Rate:  [82-93] 89  (05/03 0417) Resp:  [18-23] 18  (05/03 0417) BP: (106-134)/(70-85) 109/77 mmHg (05/03 0417) SpO2:  [92 %-99 %] 98 % (05/03 0417) Last BM Date: 05/14/11  Intake/Output from previous day: 05/02 0701 - 05/03 0700 In: 4295 [I.V.:1916; IV Piggyback:202; TPN:2177] Out: 600 [Drains:600] Intake/Output this shift: Total I/O In: 1132 [I.V.:566; TPN:566] Out: -   Physical exam:  Abdomen:  Mild distention and mild discomfort.  Removed g-tube bandage and no drainage or leaking. G-tube is currently attached to wall suction (but suction is not on).  Lab Results:  No results found for this basename: WBC:2,HGB:2,HCT:2,PLT:2 in the last 72 hours BMET  Basename 05/15/11 0540 05/14/11 0600  NA 133* 132*  K 3.5 3.1*  CL 98 95*  CO2 26 27  GLUCOSE 109* 121*  BUN 29* 33*  CREATININE 1.32* 1.37*  CALCIUM 8.4 8.7   PT/INR No results found for this basename: LABPROT:2,INR:2 in the last 72 hours ABG No results found for this basename: PHART:2,PCO2:2,PO2:2,HCO3:2 in the last 72 hours  Studies/Results: Ir Gastrostomy Tube Mod Sed  05/15/2011  *RADIOLOGY REPORT*  Clinical history:Metastatic uterine cancer with a small bowel obstruction.  The patient needs a gastrostomy tube for long-term decompression.  PROCEDURE(S): PERCUTANEOUS GASTROSTOMY TUBE WITH FLUOROSCOPIC GUIDANCE  Physician: Rachelle Hora. Lowella Dandy, MD  Medications:Versed 6.0mg , Fentanyl 200 mcg, Ancef 1 gram  Moderate sedation time:25 minutes  Fluoroscopy time: 4.7 minutes  Procedure:Informed consent was obtained for a percutaneous gastrostomy tube.  The patient was placed on the interventional table.  Fluoroscopy demonstrated oral contrast in the transverse colon.  An  orogastric tube was placed with fluoroscopic guidance. The anterior abdomen was prepped and draped in sterile fashion. Maximal barrier sterile technique was utilized including caps, mask, sterile gowns, sterile gloves, sterile drape, hand hygiene and skin antiseptic.  Stomach was inflated with air through the orogastric tube.  The skin and subcutaneous tissues were anesthetized with 1% lidocaine.  A 17 gauge needle was directed into the distended stomach with fluoroscopic guidance.  A wire was advanced into the stomach and a T-tact was deployed.  A 9-French vascular sheath was placed and the orogastric tube was snared using a Gooseneck snare device.  The orogastric tube and snare were pulled out of the patient's mouth.  The snare device was connected to a 20-French gastrostomy tube.  The snare device and gastrostomy tube were pulled through the patient's mouth and out the anterior abdominal wall.  The gastrostomy tube was cut to an appropriate length.  Contrast injection through gastrostomy tube confirmed placement within the stomach.  Fluoroscopic images were obtained for documentation.  The gastrostomy tube was flushed with normal saline.  Findings:Gastrostomy tube within the stomach.  Complications: None  Impression:Successful fluoroscopic guided percutaneous gastrostomy tube placement.  Original Report Authenticated By: Richarda Overlie, M.D.   Dg Abd Portable 1v  05/14/2011  *RADIOLOGY REPORT*  Clinical Data: NG tube placement.  Evaluate for barium content  PORTABLE ABDOMEN - 1 VIEW  Comparison: Yesterday  Findings: Barium is seen throughout the colon.  No significant barium in the stomach. No visualized barium filled small bowel loops.  IMPRESSION: Barium is in the colon.  G tube can be performed.  Original Report Authenticated By: Donavan Burnet,  M.D.    Anti-infectives: Anti-infectives     Start     Dose/Rate Route Frequency Ordered Stop   05/12/11 1830   piperacillin-tazobactam (ZOSYN) IVPB 3.375 g          3.375 g 12.5 mL/hr over 240 Minutes Intravenous Every 8 hours 05/12/11 1820     05/12/11 1030   ceFAZolin (ANCEF) IVPB 2 g/50 mL premix     Comments: GIVE ON CALL TO XRAY 4/30 FOR GASTROSTOMY TUBE PLACEMENT      2 g 100 mL/hr over 30 Minutes Intravenous On call 05/11/11 1156 05/12/11 1858          Assessment/Plan: s/p gastrostomy tube placement on 05/14/11.  The gastric tube can be used for decompression or feeding as needed.  Will follow up as needed.   LOS: 16 days    Lezli Danek RYAN 05/15/2011

## 2011-05-16 DIAGNOSIS — R112 Nausea with vomiting, unspecified: Secondary | ICD-10-CM

## 2011-05-16 DIAGNOSIS — C549 Malignant neoplasm of corpus uteri, unspecified: Secondary | ICD-10-CM

## 2011-05-16 DIAGNOSIS — E43 Unspecified severe protein-calorie malnutrition: Secondary | ICD-10-CM

## 2011-05-16 DIAGNOSIS — K56609 Unspecified intestinal obstruction, unspecified as to partial versus complete obstruction: Secondary | ICD-10-CM

## 2011-05-16 LAB — PROCALCITONIN: Procalcitonin: 0.21 ng/mL

## 2011-05-16 LAB — CBC
HCT: 25.5 % — ABNORMAL LOW (ref 36.0–46.0)
MCV: 82.8 fL (ref 78.0–100.0)
Platelets: 457 10*3/uL — ABNORMAL HIGH (ref 150–400)
RBC: 3.08 MIL/uL — ABNORMAL LOW (ref 3.87–5.11)
WBC: 9.6 10*3/uL (ref 4.0–10.5)

## 2011-05-16 LAB — GLUCOSE, CAPILLARY
Glucose-Capillary: 127 mg/dL — ABNORMAL HIGH (ref 70–99)
Glucose-Capillary: 81 mg/dL (ref 70–99)
Glucose-Capillary: 119 mg/dL — ABNORMAL HIGH (ref 70–99)

## 2011-05-16 LAB — BASIC METABOLIC PANEL
GFR calc Af Amer: 68 mL/min — ABNORMAL LOW (ref >90)
GFR calc non Af Amer: 59 mL/min — ABNORMAL LOW (ref >90)
Glucose, Bld: 139 mg/dL — ABNORMAL HIGH (ref 70–99)
Potassium: 3.5 mEq/L (ref 3.5–5.1)
Sodium: 136 mEq/L (ref 135–145)

## 2011-05-16 MED ORDER — INSULIN ASPART 100 UNIT/ML ~~LOC~~ SOLN
0.0000 [IU] | Freq: Four times a day (QID) | SUBCUTANEOUS | Status: DC
  Administered 2011-05-17: 2 [IU] via SUBCUTANEOUS
  Administered 2011-05-18 – 2011-05-21 (×5): 1 [IU] via SUBCUTANEOUS
  Administered 2011-05-21: 2 [IU] via SUBCUTANEOUS

## 2011-05-16 MED ORDER — FAT EMULSION 20 % IV EMUL
250.0000 mL | INTRAVENOUS | Status: AC
  Administered 2011-05-16: 250 mL via INTRAVENOUS
  Filled 2011-05-16: qty 250

## 2011-05-16 MED ORDER — CLINIMIX/DEXTROSE (5/15) 5 % IV SOLN
INTRAVENOUS | Status: AC
  Administered 2011-05-16: 18:00:00 via INTRAVENOUS
  Filled 2011-05-16: qty 2000

## 2011-05-16 MED ORDER — VANCOMYCIN HCL 1000 MG IV SOLR
1250.0000 mg | Freq: Two times a day (BID) | INTRAVENOUS | Status: DC
  Administered 2011-05-16 – 2011-05-18 (×5): 1250 mg via INTRAVENOUS
  Filled 2011-05-16 (×7): qty 1250

## 2011-05-16 MED ORDER — SODIUM CHLORIDE 0.9 % IV BOLUS (SEPSIS)
500.0000 mL | Freq: Once | INTRAVENOUS | Status: AC
  Administered 2011-05-16: 500 mL via INTRAVENOUS

## 2011-05-16 NOTE — Progress Notes (Signed)
Patient ID: Krystal [name], female   DOB: September 01, 1970, 41 y.o.   MRN: [id number]  Assessment/Plan:   Principal Problem:   *Partial small bowel obstruction  - Surgical consultation performed by Dr. Lodema Pilot on 05/03/11: Surgical intervention felt to be only used as a last resort.  - Gastric tube drainage still continuing  - no nausea or vomiting in last 12 hours - TNA started for temporary nutritional purposes  Active Problems:   Endometrial Ca / Ascites - Status post cycle 1 cisplatin/Adriamycin.  - Rapidly progressive per Dr. Precious Reel notes.  - Paracentesis done on 05/12/2011.  - Not currently strong enough for further chemotherapy.   Neutropenia / thrombocytopenia  - Related to prior chemotherapy.  - Status post treatment with Neupogen, now discontinued.  - WBC and PLT count within normal limits at this time  Chronic constipation  - per patient had 1 small BM; continue bisacodyl PR TID  Inadequate oral intake  - TNA per pharmacy  Hypertension  - BP 116/77 - continue clonidine patch  Acute renal failure/mild hydronephrosis  - Creatinine 1.13 05/15/2011; slowly trending downwards - follow up kidney function in am  Normocytic anemia  - Likely from anemia of chronic disease and the sequela of chemotherapy.  - Hemoglobin stable.  Fever  - patient spiked fever while on zosyn - unclear etiology at this time - follow up blood culture results - started vancomycin 05/15/2011  Hypokalemia  - secondary to GI losses, G tube decompression - follow up BMP in am  Code Status: Full   Family Communication: Updated at bedside.   Disposition Plan: Home, when stable.   Medical Consultants:  Dr. Jama Flavors, Oncology  Dr. Lodema Pilot, Surgery  Dr. Deanne Coffer, Interventional radiology  Other consultants:  Pharmacy - for TNA and antibiotic dosing management  Antibiotics:  Zosyn 05/12/2011---> Vancomycin 05/15/2011 -->  Subjective: No events overnight. Patient reports  feeling much better today with no reports of nausea or vomiting. Patient has tolerated phenergan.  Objective:  Vital signs in last 24 hours:  Filed Vitals:   05/15/11 1519 05/15/11 2030 05/15/11 2245 05/16/11 0508  BP: 112/76 126/85  116/77  Pulse: 93 101  95  Temp: 98.4 F (36.9 C) 100.3 F (37.9 C) 98.9 F (37.2 C) 99.2 F (37.3 C)  TempSrc: Oral Oral Oral Oral  Resp: 18 19  18   Height:      Weight:      SpO2: 97% 98%  99%    Intake/Output from previous day:  Intake/Output Summary (Last 24 hours) at 05/16/11 1028 Last data filed at 05/16/11 0900  Gross per 24 hour  Intake   2779 ml  Output    600 ml  Net   2179 ml    Physical Exam: General: Alert, awake, oriented x3, in no acute distress. HEENT: No bruits, no goiter. Moist mucous membranes, no scleral icterus, no conjunctival pallor. Heart: Regular rate and rhythm, S1/S2 +, no murmurs, rubs, gallops. Lungs: Clear to auscultation bilaterally. No wheezing, no rhonchi, no rales.  Abdomen: Soft, less distended with mild tenderness on deep palpation but no rebound tenderness or guarding, positive bowel sounds. Extremities: No clubbing or cyanosis, no pitting edema,  positive pedal pulses. Neuro: Grossly nonfocal.  Lab Results:  Lab 05/12/11 0406 05/11/11 0406  WBC 5.6 5.8  HGB 9.5* 9.4*  HCT 30.5* 31.0*  PLT 267 213  MCV 82.9 82.9    Lab 05/16/11 0520 05/15/11 0540 05/14/11 0600 05/13/11 0625 05/12/11 0406  NA 136  133* 132* 130* 132*  K 3.5 3.5 3.1* 3.4* 3.6  CL 102 98 95* 90* 92*  CO2 24 26 27 29 25   GLUCOSE 139* 109* 121* 127* 125*  BUN 27* 29* 33* 40* 35*  CREATININE 1.13* 1.32* 1.37* 1.58* 1.38*  CALCIUM 8.5 8.4 8.7 8.6 9.3    Lab 05/12/11 0406  INR 0.93    Studies/Results: Ir Gastrostomy Tube Mod Sed 05/15/2011   PROCEDURE(S): PERCUTANEOUS GASTROSTOMY TUBE WITH FLUOROSCOPIC GUIDANCE  Physician: Rachelle Hora. Lowella Dandy, MD  Medications:Versed 6.0mg , Fentanyl 200 mcg, Ancef 1 gram  Moderate sedation time:25  minutes  Fluoroscopy time: 4.7 minutes  Procedure:Informed consent was obtained for a percutaneous gastrostomy tube.  The patient was placed on the interventional table.  Fluoroscopy demonstrated oral contrast in the transverse colon.  An orogastric tube was placed with fluoroscopic guidance. The anterior abdomen was prepped and draped in sterile fashion. Maximal barrier sterile technique was utilized including caps, mask, sterile gowns, sterile gloves, sterile drape, hand hygiene and skin antiseptic.  Stomach was inflated with air through the orogastric tube.  The skin and subcutaneous tissues were anesthetized with 1% lidocaine.  A 17 gauge needle was directed into the distended stomach with fluoroscopic guidance.  A wire was advanced into the stomach and a T-tact was deployed.  A 9-French vascular sheath was placed and the orogastric tube was snared using a Gooseneck snare device.  The orogastric tube and snare were pulled out of the patient's mouth.  The snare device was connected to a 20-French gastrostomy tube.  The snare device and gastrostomy tube were pulled through the patient's mouth and out the anterior abdominal wall.  The gastrostomy tube was cut to an appropriate length.  Contrast injection through gastrostomy tube confirmed placement within the stomach.  Fluoroscopic images were obtained for documentation.  The gastrostomy tube was flushed with normal saline.  Findings:Gastrostomy tube within the stomach.  Complications: None  Impression:Successful fluoroscopic guided percutaneous gastrostomy tube placement.   Medications: Scheduled Meds:   . bisacodyl  10 mg Rectal TID  . cloNIDine  0.2 mg Transdermal Weekly  . enoxaparin  40 mg Subcutaneous Q24H  . insulin aspart  0-9 Units Subcutaneous Q6H  . piperacillin-tazobactam (ZOSYN)  IV  3.375 g Intravenous Q8H  . sodium chloride  500 mL Intravenous Once  . vancomycin  1,250 mg Intravenous Q12H   Continuous Infusions:   . TPN (CLINIMIX) +/-  additives 80 mL/hr at 05/15/11 0637   And  . fat emulsion 250 mL (05/15/11 0637)  . TPN (CLINIMIX) +/- additives Stopped (05/16/11 0900)   And  . fat emulsion Stopped (05/16/11 0900)  . TPN (CLINIMIX) +/- additives     And  . fat emulsion    . 0.9 % sodium chloride with kcl 75 mL/hr at 05/16/11 0520   PRN Meds:.acetaminophen, albuterol, guaiFENesin-dextromethorphan, HYDROmorphone (DILAUDID) injection, HYDROmorphone (DILAUDID) injection, ipratropium, LORazepam, ondansetron (ZOFRAN) IV, oxyCODONE, phenol, promethazine, sodium phosphate   LOS: 17 days   Jenesis Martin 05/16/2011, 10:28 AM  [facility] Pager: [phone]

## 2011-05-16 NOTE — Progress Notes (Signed)
Lab unable to draw Blood Cultures due to poor venous access. Dr.Devine notified. Blood Cultures discontinued.Krystal [name]

## 2011-05-16 NOTE — Progress Notes (Signed)
PARENTERAL NUTRITION CONSULT NOTE - Follow-Up  Pharmacy Consult for TNA Indication: pSBO  No Known Allergies  Patient Measurements: Height: 5\' 6"  (167.6 cm) Weight: 205 lb (92.987 kg) IBW/kg (Calculated) : 59.3  Adjusted Body Weight: 69 kg  Vital Signs: Temp: 99.2 F (37.3 C) (05/04 0508) Temp src: Oral (05/04 0508) BP: 116/77 mmHg (05/04 0508) Pulse Rate: 95  (05/04 0508) Intake/Output from previous day: 05/03 0701 - 05/04 0700 In: 2566 [I.V.:1063.5; IV Piggyback:100; TPN:1402.5] Out: 600 [Urine:600] Intake/Output from this shift: Total I/O In: 213 [TPN:213] Out: -   Labs: No results found for this basename: WBC:3,HGB:3,HCT:3,PLT:3,APTT:3,INR:3 in the last 72 hours   Basename 05/16/11 0520 05/15/11 0540 05/14/11 0600  NA 136 133* 132*  K 3.5 3.5 3.1*  CL 102 98 95*  CO2 24 26 27   GLUCOSE 139* 109* 121*  BUN 27* 29* 33*  CREATININE 1.13* 1.32* 1.37*  LABCREA -- -- --  CREAT24HRUR -- -- --  CALCIUM 8.5 8.4 8.7  MG -- -- 2.2  PHOS 2.8 -- 3.1  PROT -- -- 6.9  ALBUMIN -- -- 2.5*  AST -- -- 43*  ALT -- -- 63*  ALKPHOS -- -- 115  BILITOT -- -- 0.7  BILIDIR -- -- --  IBILI -- -- --  PREALBUMIN -- -- --  TRIG -- -- --  CHOLHDL -- -- --  CHOL -- -- --   Estimated Creatinine Clearance: 73.8 ml/min (by C-G formula based on Cr of 1.13).    Basename 05/16/11 0803 05/16/11 0408 05/16/11 0012  GLUCAP 107* 127* 133*    Medical History: Past Medical History  Diagnosis Date  . Hypertension     SINCE FIRST PREGNANCY  . Asthma   . Constipation   . History of miscarriage   . Preeclampsia   . Postoperative ileus 07/2010  . Iron deficiency anemia     s/p transfusions [facility]  . Uterine cancer     Medications:  Scheduled:     . bisacodyl  10 mg Rectal TID  . cloNIDine  0.2 mg Transdermal Weekly  . enoxaparin  40 mg Subcutaneous Q24H  . insulin aspart  0-9 Units Subcutaneous Q4H  . piperacillin-tazobactam (ZOSYN)  IV  3.375 g Intravenous Q8H  . sodium  chloride  500 mL Intravenous Once  . vancomycin  1,250 mg Intravenous Q12H   Infusions:     . TPN (CLINIMIX) +/- additives 80 mL/hr at 05/15/11 0637   And  . fat emulsion 250 mL (05/15/11 0637)  . TPN (CLINIMIX) +/- additives Stopped (05/16/11 0900)   And  . fat emulsion Stopped (05/16/11 0900)  . 0.9 % sodium chloride with kcl 75 mL/hr at 05/16/11 0520  . DISCONTD: fat emulsion    . DISCONTD: 0.9 % sodium chloride with kcl 75 mL/hr at 05/15/11 0931  . DISCONTD: TPN (CLINIMIX) +/- additives      Insulin Requirements in the past 24 hours:  CBGs 121, 133, 127, 107 during cyclic TNA last night.  3 units SSI given.  Current Nutrition:  NPO  Clinimix 5/15 cyclic delivered over 16hrs. Fat Emulsion 20% at 59ml/hr over 16hrs. NS w/ KCl/L @ 81ml/hr  Nutritional Goals:  Per RD: Kcal goals: 1750-2050/day, Protein 70-90g/day, Fluid 1.7-2L/day. Clinimix 5/15: at goal of 1976ml/day and fat emulsion 20% 258ml/day = 1843kcal/day and 96g protein/day  Assessment:  41 yo female with past medical history of endometrial cancer admitted on 4/17 with abdominal cramping and a partial SBO.  Being managed medically.  G tube.  TNA was started 4/25, increased to goal 4/28, started wean 4/29 but unable to tolerate PO so increased back to goal 4/30. Currently on electrolyte-free formula d/t elevated Phos. Changed to cyclic/nocturnal last night in anticipation of discharge on TNA.  CBG within goal <150  Chemistires: Na improved to wnl. K maintaining at low end of normal,  CO2 now wnl, Scr was trending up but now improving. Phos staying wnl.  LFTs slightly increased(5/2)  Lipids wnl (TG 141, 4/29).  Prealbumin - 11.8(4/29).  Plan:   Continue same Clinimix 5/15, electrolyte free formula and fat emulsion at cyclic rate to deliver goal volume over 16hrs.  Plan to transition to 12hr cyclic rate as tolerated.  Consider adding electrolytes back if phos gets low.  MVI and trace elements on  MWF only due to ongoing shortage.  Continue NS w/ KCl/L @75ml /hr.  Reduce SSI to q6h.   Bmet and phos in am.  Charolotte Eke, PharmD, pager [phone]. 05/16/2011,9:43 AM.

## 2011-05-16 NOTE — Progress Notes (Signed)
ANTIBIOTIC CONSULT NOTE - Follow Up  Pharmacy Consult for Zosyn and Vanc to start today Indication: Fever, unknown site of infection  No Known Allergies  Patient Measurements: Height: 5\' 6"  (167.6 cm) Weight: 205 lb (92.987 kg) IBW/kg (Calculated) : 59.3   Vital Signs: Temp: 99.2 F (37.3 C) (05/04 0508) Temp src: Oral (05/04 0508) BP: 116/77 mmHg (05/04 0508) Pulse Rate: 95  (05/04 0508) Intake/Output from previous day: 05/03 0701 - 05/04 0700 In: 2566 [I.V.:1063.5; IV Piggyback:100; TPN:1402.5] Out: 600 [Urine:600] Intake/Output from this shift:    Labs:  Basename 05/16/11 0520 05/15/11 0540 05/14/11 0600  WBC -- -- --  HGB -- -- --  PLT -- -- --  LABCREA -- -- --  CREATININE 1.13* 1.32* 1.37*   Estimated Creatinine Clearance: 73.8 ml/min (by C-G formula based on Cr of 1.13).  Microbiology: Recent Results (from the past 720 hour(s))  URINE CULTURE     Status: Normal   Collection Time   04/29/11 10:17 PM      Component Value Range Status Comment   Specimen Description URINE, CLEAN CATCH   Final    Special Requests none   Final    Culture  Setup Time [id number]   Final    Colony Count NO GROWTH   Final    Culture NO GROWTH   Final    Report Status 04/30/2011 FINAL   Final   URINE CULTURE     Status: Normal   Collection Time   05/03/11 10:27 PM      Component Value Range Status Comment   Specimen Description URINE, RANDOM   Final    Special Requests NONE   Final    Culture  Setup Time [id number]   Final    Colony Count 25,000 COLONIES/ML   Final    Culture     Final    Value: Multiple bacterial morphotypes present, none predominant. Suggest appropriate recollection if clinically indicated.   Report Status 05/05/2011 FINAL   Final   CULTURE, BLOOD (ROUTINE X 2)     Status: Normal   Collection Time   05/03/11 11:10 PM      Component Value Range Status Comment   Specimen Description BLOOD RIGHT [facility]   Final    Special Requests BOTTLES DRAWN AEROBIC AND  ANAEROBIC 5CC   Final    Culture  Setup Time [id number]   Final    Culture NO GROWTH 5 DAYS   Final    Report Status 05/10/2011 FINAL   Final     Medical History: Past Medical History  Diagnosis Date  . Hypertension     SINCE FIRST PREGNANCY  . Asthma   . Constipation   . History of miscarriage   . Preeclampsia   . Postoperative ileus 07/2010  . Iron deficiency anemia     s/p transfusions [facility]  . Uterine cancer     Assessment:  41 YOF D #5 Zosyn 3.375g IV q8h for Tm 100.5 (4/30) in setting of endometrial CA and bowel obstruction.    Fever last night of 100.3 starting vancomycin per pharmacy dosing today  Goal of Therapy:  dose adjusted per renal clearance  Plan:   Continue Zosyn 3.375g IV q8h (4 hour infusion time).  Vancomycin 1250mg  IV q12. Will check a trough at steady state as needed  LOT per MD   Hessie Knows, PharmD, BCPS Pager [phone] 05/16/2011 8:37 AM

## 2011-05-16 NOTE — Progress Notes (Signed)
SUBJECTIVE:  G-tube clamped for 4 hours this morning with nausea.   Attempts limited ambulation because of fatigue. Abdominal discomfort managed with narcotics.   MEDICATIONS:  Scheduled:   . bisacodyl  10 mg Rectal TID  . cloNIDine  0.2 mg Transdermal Weekly  . enoxaparin  40 mg Subcutaneous Q24H  . insulin aspart  0-9 Units Subcutaneous Q6H  . piperacillin-tazobactam (ZOSYN)  IV  3.375 g Intravenous Q8H  . sodium chloride  500 mL Intravenous Once  . vancomycin  1,250 mg Intravenous Q12H  . DISCONTD: insulin aspart  0-9 Units Subcutaneous Q4H   Continuous:   . TPN (CLINIMIX) +/- additives 80 mL/hr at 05/15/11 0637   And  . fat emulsion 250 mL (05/15/11 0637)  . TPN (CLINIMIX) +/- additives Stopped (05/16/11 0900)   And  . fat emulsion Stopped (05/16/11 0900)  . TPN (CLINIMIX) +/- additives     And  . fat emulsion    . 0.9 % sodium chloride with kcl 75 mL/hr at 05/16/11 0520  . DISCONTD: fat emulsion    . DISCONTD: 0.9 % sodium chloride with kcl 75 mL/hr at 05/15/11 0931  . DISCONTD: TPN (CLINIMIX) +/- additives     ZOX:[id number], albuterol, guaiFENesin-dextromethorphan, HYDROmorphone (DILAUDID) injection, HYDROmorphone (DILAUDID) injection, ipratropium, LORazepam, ondansetron (ZOFRAN) IV, oxyCODONE, phenol, promethazine, sodium phosphate   PHYSICAL EXAM  Vital signs in last 24 hours: Temp:  [98.4 F (36.9 C)-100.3 F (37.9 C)] 99.2 F (37.3 C) (05/04 0508) Pulse Rate:  [93-101] 95  (05/04 0508) Resp:  [18-19] 18  (05/04 0508) BP: (112-126)/(76-85) 116/77 mmHg (05/04 0508) SpO2:  [97 %-99 %] 99 % (05/04 0508)  Intake/Output from previous day: 05/03 0701 - 05/04 0700 In: 2566 [I.V.:1063.5; IV Piggyback:100; TPN:1402.5] Out: 600 [Urine:600] Intake/Output this shift: Total I/O In: 213 [TPN:213] Out: -   General appearance: alert, cooperative and appears stated age Resp: clear to auscultation bilaterally Cardio: regular rate and rhythm, S1, S2 normal, no  murmur, click, rub or gallop GI: soft, non-tender; bowel sounds normal; no masses,  no organomegaly.  G tube in place draining yellow fluid.  Extremities: extremities normal, atraumatic, no cyanosis or edema Port - no surrounding erythema.    LABS:  CBC    Component Value Date/Time   WBC 5.6 05/12/2011 0406   WBC 5.6 04/27/2011 0852   RBC 3.68* 05/12/2011 0406   RBC 4.28 04/27/2011 0852   HGB 9.5* 05/12/2011 0406   HGB 11.0* 04/27/2011 0852   HCT 30.5* 05/12/2011 0406   HCT 34.7* 04/27/2011 0852   PLT 267 05/12/2011 0406   PLT 233 04/27/2011 0852   MCV 82.9 05/12/2011 0406   MCV 81.1 04/27/2011 0852   MCH 25.8* 05/12/2011 0406   MCH 25.7 04/27/2011 0852   MCHC 31.1 05/12/2011 0406   MCHC 31.7 04/27/2011 0852   RDW 16.4* 05/12/2011 0406   RDW 15.7* 04/27/2011 0852   LYMPHSABS 0.6* 05/08/2011 0415   LYMPHSABS 1.2 04/27/2011 0852   MONOABS 0.3 05/08/2011 0415   MONOABS 0.4 04/27/2011 0852   EOSABS 0.0 05/08/2011 0415   EOSABS 0.1 04/27/2011 0852   BASOSABS 0.0 05/08/2011 0415   BASOSABS 0.0 04/27/2011 0852     Basename 05/16/11 0520 05/15/11 0540  NA 136 133*  K 3.5 3.5  CL 102 98  CO2 24 26  GLUCOSE 139* 109*  BUN 27* 29*  CREATININE 1.13* 1.32*  CALCIUM 8.5 8.4     ASSESSMENT and PLAN: 1.  Stage IV endometrial cancer.  S/p multiple  procedures including chemotherapy.  Most recent was cisplatin and adriamycin. 2.  Bowel obstruction.  S/p G-tube.   3. Nutrition - On TNA 4. Fatigue and weakness on ambulation - Obtain CBC.  If hemoglobin <9.0 will transfuse 2 units of PRBC.   5. Continue to encourage ambulation.   Arlan Organ I., MD 05/16/2011

## 2011-05-16 NOTE — Progress Notes (Signed)
Spoke w/Erin in [place] re: maintenance IV fluid infusing in same port as TNA. TNA w/o electrolytes at present. Pharmacy will look at formula & adjust as needed for infusion if appropriate. OK given to infuse maintenance IV fluid w/TNA for K support  Due to poor peripheral access.Hartley Barefoot

## 2011-05-17 DIAGNOSIS — E43 Unspecified severe protein-calorie malnutrition: Secondary | ICD-10-CM

## 2011-05-17 DIAGNOSIS — K56609 Unspecified intestinal obstruction, unspecified as to partial versus complete obstruction: Secondary | ICD-10-CM

## 2011-05-17 DIAGNOSIS — C549 Malignant neoplasm of corpus uteri, unspecified: Secondary | ICD-10-CM

## 2011-05-17 DIAGNOSIS — R112 Nausea with vomiting, unspecified: Secondary | ICD-10-CM

## 2011-05-17 LAB — CBC
HCT: 29 % — ABNORMAL LOW (ref 36.0–46.0)
Hemoglobin: 9.4 g/dL — ABNORMAL LOW (ref 12.0–15.0)
MCH: 27.3 pg (ref 26.0–34.0)
MCHC: 32.4 g/dL (ref 30.0–36.0)
MCV: 84.3 fL (ref 78.0–100.0)
RBC: 3.44 MIL/uL — ABNORMAL LOW (ref 3.87–5.11)

## 2011-05-17 LAB — TYPE AND SCREEN: Antibody Screen: NEGATIVE

## 2011-05-17 LAB — BASIC METABOLIC PANEL
BUN: 25 mg/dL — ABNORMAL HIGH (ref 6–23)
CO2: 23 mEq/L (ref 19–32)
Chloride: 104 mEq/L (ref 96–112)
Creatinine, Ser: 1.07 mg/dL (ref 0.50–1.10)
GFR calc Af Amer: 73 mL/min — ABNORMAL LOW (ref >90)
Glucose, Bld: 140 mg/dL — ABNORMAL HIGH (ref 70–99)

## 2011-05-17 LAB — GLUCOSE, CAPILLARY
Glucose-Capillary: 90 mg/dL (ref 70–99)
Glucose-Capillary: 78 mg/dL (ref 70–99)

## 2011-05-17 MED ORDER — SODIUM CHLORIDE 0.9 % IV SOLN
INTRAVENOUS | Status: DC
  Administered 2011-05-17 – 2011-05-19 (×2): via INTRAVENOUS
  Filled 2011-05-17 (×7): qty 1000

## 2011-05-17 MED ORDER — FAT EMULSION 20 % IV EMUL
250.0000 mL | INTRAVENOUS | Status: AC
  Administered 2011-05-17: 250 mL via INTRAVENOUS
  Filled 2011-05-17: qty 250

## 2011-05-17 MED ORDER — LORAZEPAM 2 MG/ML IJ SOLN
1.0000 mg | Freq: Four times a day (QID) | INTRAMUSCULAR | Status: DC | PRN
  Filled 2011-05-17: qty 1

## 2011-05-17 MED ORDER — CLINIMIX E/DEXTROSE (5/15) 5 % IV SOLN
INTRAVENOUS | Status: AC
  Administered 2011-05-17: 18:00:00 via INTRAVENOUS
  Filled 2011-05-17: qty 2000

## 2011-05-17 NOTE — Progress Notes (Addendum)
Patient ID: Krystal [name], female   DOB: 09-25-1970, 41 y.o.   MRN: [id number]  Assessment/Plan:   Principal Problem:   *Partial small bowel obstruction  - Surgical consultation performed by Dr. Lodema Pilot on 05/03/11: Surgical intervention felt to be only used as a last resort.  - Gastric tube drainage still continuing; 600 cc drained 05/15/2011 and then 900 cc 05/17/2011  - TNA started for temporary nutritional purposes   Active Problems:   Endometrial Ca / Ascites  - Status post cycle 1 cisplatin/Adriamycin.  - Rapidly progressive per Dr. Precious Reel notes.  - Paracentesis done on 05/12/2011.  - Not currently strong enough for further chemotherapy.  - oncology following  Neutropenia / thrombocytopenia  - Related to prior chemotherapy.  - Status post treatment with Neupogen, now discontinued.  - WBC and PLT count within normal limits at this time   Chronic constipation  - continue bisacodyl PR TID   Inadequate oral intake  - TNA per pharmacy   Hypertension  - BP 117/77 - continue clonidine patch   Acute renal failure/mild hydronephrosis  - Creatinine 1.13 05/15/2011; slowly trending downwards  - creatine is within normal limits  Normocytic anemia  - Likely from anemia of chronic disease and the sequela of chemotherapy.  - status post 2 units PRBCs transfusion 05/16/2011 with appropriate rise in hemoglobin from 7.8 to 9.4   Fever  - patient spiked fever while on zosyn in past 24 hours - unclear etiology at this time  - several attempts to draw blood cultures were unsuccessful  - started vancomycin 05/15/2011   Hypokalemia  - secondary to GI losses, G tube decompression  - follow up BMP today shows potassium within normal limits  Code Status: Full  Family Communication: Updated at bedside.  Disposition Plan: Home, when stable.   Medical Consultants:  Dr. Jama Flavors, Oncology  Dr. Lodema Pilot, Surgery  Dr. Deanne Coffer, Interventional radiology Other consultants:    Pharmacy - for TNA and antibiotic dosing management Antibiotics:  Zosyn 05/12/2011--->  Vancomycin 05/15/2011 -->   Subjective: No events overnight. Patient reports nausea and vomiting once G tube clamped.  Objective:  Vital signs in last 24 hours:  Filed Vitals:   05/16/11 1628 05/16/11 1728 05/16/11 2207 05/17/11 0538  BP: 124/83 115/73 122/84 117/77  Pulse: 95 93 84 89  Temp: 98.9 F (37.2 C) 98.7 F (37.1 C) 98.8 F (37.1 C) 98.9 F (37.2 C)  TempSrc:   Oral Oral  Resp: 16 18 16 18   Height:      Weight:      SpO2:   97% 97%    Intake/Output from previous day:  Intake/Output Summary (Last 24 hours) at 05/17/11 1148 Last data filed at 05/17/11 0630  Gross per 24 hour  Intake    682 ml  Output    900 ml  Net   -218 ml    Physical Exam: General: Alert, awake, oriented x3, in no acute distress. HEENT: No bruits, no goiter. Moist mucous membranes, no scleral icterus, no conjunctival pallor. Heart: Regular rate and rhythm, S1/S2 +, no murmurs, rubs, gallops. Lungs: Clear to auscultation bilaterally. No wheezing, no rhonchi, no rales.  Abdomen: tender over mid abdominal area, not significantly distended but hard to palpation, able to appreciate bowel sounds. Extremities: No clubbing or cyanosis, no pitting edema,  positive pedal pulses. Neuro: Grossly nonfocal.  Lab Results:  Lab 05/17/11 0620 05/16/11 1150 05/12/11 0406 05/11/11 0406  WBC 9.6 9.6 5.6 5.8  HGB 9.4*  7.8* 9.5* 9.4*  HCT 29.0* 25.5* 30.5* 31.0*  PLT 376 457* 267 213  MCV 84.3 82.8 82.9 82.9    Lab 05/17/11 0620 05/16/11 0520 05/15/11 0540 05/14/11 0600 05/13/11 0625  NA 137 136 133* 132* 130*  K 3.5 3.5 3.5 3.1* 3.4*  CL 104 102 98 95* 90*  CO2 23 24 26 27 29   GLUCOSE 140* 139* 109* 121* 127*  BUN 25* 27* 29* 33* 40*  CREATININE 1.07 1.13* 1.32* 1.37* 1.58*  CALCIUM 8.8 8.5 8.4 8.7 8.6    Lab 05/12/11 0406  INR 0.93  PROTIME --    Medications: Scheduled Meds:   . bisacodyl  10 mg  Rectal TID  . cloNIDine  0.2 mg Transdermal Weekly  . enoxaparin  40 mg Subcutaneous Q24H  . insulin aspart  0-9 Units Subcutaneous Q6H  . piperacillin-tazobactam (ZOSYN)  IV  3.375 g Intravenous Q8H  . 0.9 % sodium chloride with kcl   Intravenous Q24H  . vancomycin  1,250 mg Intravenous Q12H   Continuous Infusions:   . TPN (CLINIMIX) +/- additives     And  . fat emulsion 250 mL (05/16/11 1808)  . TPN (CLINIMIX) +/- additives     And  . fat emulsion    . DISCONTD: 0.9 % sodium chloride with kcl 75 mL/hr at 05/16/11 0520   PRN Meds:.acetaminophen, albuterol, guaiFENesin-dextromethorphan, HYDROmorphone (DILAUDID) injection, HYDROmorphone (DILAUDID) injection, ipratropium, LORazepam, ondansetron (ZOFRAN) IV, oxyCODONE, phenol, promethazine, sodium phosphate   LOS: 18 days   Krystal [name] 05/17/2011, 11:48 AM  [facility] Pager: [phone]

## 2011-05-17 NOTE — Progress Notes (Signed)
PARENTERAL NUTRITION CONSULT NOTE - Follow-Up  Pharmacy Consult for TNA Indication: pSBO  No Known Allergies  Patient Measurements: Height: 5\' 6"  (167.6 cm) Weight: 205 lb (92.987 kg) IBW/kg (Calculated) : 59.3  Adjusted Body Weight: 69 kg  Vital Signs: Temp: 98.9 F (37.2 C) (05/05 0538) Temp src: Oral (05/05 0538) BP: 117/77 mmHg (05/05 0538) Pulse Rate: 89  (05/05 0538) Intake/Output from previous day: 05/04 0701 - 05/05 0700 In: 895 [Blood:682; TPN:213] Out: 900 [Drains:900] Intake/Output from this shift:    Labs:  [facility] 05/17/11 0620 05/16/11 1150  WBC 9.6 9.6  HGB 9.4* 7.8*  HCT 29.0* 25.5*  PLT 376 457*  APTT -- --  INR -- --     Basename 05/17/11 0620 05/16/11 0520 05/15/11 0540  NA 137 136 133*  K 3.5 3.5 3.5  CL 104 102 98  CO2 23 24 26   GLUCOSE 140* 139* 109*  BUN 25* 27* 29*  CREATININE 1.07 1.13* 1.32*  LABCREA -- -- --  CREAT24HRUR -- -- --  CALCIUM 8.8 8.5 8.4  MG -- -- --  PHOS 2.3 2.8 --  PROT -- -- --  ALBUMIN -- -- --  AST -- -- --  ALT -- -- --  ALKPHOS -- -- --  BILITOT -- -- --  BILIDIR -- -- --  IBILI -- -- --  PREALBUMIN -- -- --  TRIG -- -- --  CHOLHDL -- -- --  CHOL -- -- --   Estimated Creatinine Clearance: 77.9 ml/min (by C-G formula based on Cr of 1.07).    Basename 05/17/11 0752 05/17/11 0534 05/17/11 0004  GLUCAP 135* 136* 118*    Medical History: Past Medical History  Diagnosis Date  . Hypertension     SINCE FIRST PREGNANCY  . Asthma   . Constipation   . History of miscarriage   . Preeclampsia   . Postoperative ileus 07/2010  . Iron deficiency anemia     s/p transfusions [facility]  . Uterine cancer     Medications:  Scheduled:     . bisacodyl  10 mg Rectal TID  . cloNIDine  0.2 mg Transdermal Weekly  . enoxaparin  40 mg Subcutaneous Q24H  . insulin aspart  0-9 Units Subcutaneous Q6H  . piperacillin-tazobactam (ZOSYN)  IV  3.375 g Intravenous Q8H  . sodium chloride  500 mL Intravenous Once  .  vancomycin  1,250 mg Intravenous Q12H  . DISCONTD: insulin aspart  0-9 Units Subcutaneous Q4H   Infusions:     . TPN (CLINIMIX) +/- additives Stopped (05/16/11 0900)   And  . fat emulsion Stopped (05/16/11 0900)  . TPN (CLINIMIX) +/- additives     And  . fat emulsion 250 mL (05/16/11 1808)  . 0.9 % sodium chloride with kcl 75 mL/hr at 05/16/11 0520    Insulin Requirements in the past 24 hours:  CBGs 119, 135, 136 during cyclic TNA last night.  2 units SSI given.  Current Nutrition:  NPO  Clinimix 5/15 cyclic delivered over 16hrs. Fat Emulsion 20% at 31ml/hr over 16hrs. NS w/ KCl/L @ 42ml/hr  Nutritional Goals:  Per RD: Kcal goals: 1750-2050/day, Protein 70-90g/day, Fluid 1.7-2L/day. Clinimix 5/15: at goal of 1940ml/day and fat emulsion 20% 255ml/day = 1843kcal/day and 96g protein/day  Assessment:  41 yo female with past medical history of endometrial cancer admitted on 4/17 with abdominal cramping and a partial SBO.  Being managed medically.  TNA was started 4/25, increased to goal 4/28, started wean 4/29 but  unable to tolerate PO so increased back to goal 4/30. Currently on electrolyte-free formula d/t elevated Phos. Changed to cyclic/nocturnal 5/3.   CBG within goal <150  Chemistries: improved, stable. SCr normalized.  LFTs slightly increased(5/2)  Lipids wnl (TG 141, 4/29).  Prealbumin - 11.8(4/29).  Plan:   Cont same cyclic TNA but change back to electrolyte-containing formula: Clinimix E5/15 and IVFE 20% at cyclic rate to deliver goal volume over 16hrs.  Plan to transition to 12hr cyclic rate as tolerated.  MVI and trace elements on MWF only due to ongoing shortage.  Continue NS w/ KCl/L @75ml /hr BUT hold while TNA is infusing.   Continue SSI q6h.   Full set of labs in the am.  Charolotte Eke, PharmD, pager [phone]. 05/17/2011,8:19 AM.

## 2011-05-18 ENCOUNTER — Inpatient Hospital Stay (HOSPITAL_COMMUNITY): Payer: Medicaid Other

## 2011-05-18 DIAGNOSIS — C549 Malignant neoplasm of corpus uteri, unspecified: Secondary | ICD-10-CM

## 2011-05-18 DIAGNOSIS — E43 Unspecified severe protein-calorie malnutrition: Secondary | ICD-10-CM

## 2011-05-18 DIAGNOSIS — R112 Nausea with vomiting, unspecified: Secondary | ICD-10-CM

## 2011-05-18 DIAGNOSIS — K56609 Unspecified intestinal obstruction, unspecified as to partial versus complete obstruction: Secondary | ICD-10-CM

## 2011-05-18 LAB — GLUCOSE, CAPILLARY
Glucose-Capillary: 118 mg/dL — ABNORMAL HIGH (ref 70–99)
Glucose-Capillary: 142 mg/dL — ABNORMAL HIGH (ref 70–99)

## 2011-05-18 LAB — COMPREHENSIVE METABOLIC PANEL
AST: 16 U/L (ref 0–37)
Albumin: 2.1 g/dL — ABNORMAL LOW (ref 3.5–5.2)
Alkaline Phosphatase: 91 U/L (ref 39–117)
Chloride: 105 mEq/L (ref 96–112)
Potassium: 3.9 mEq/L (ref 3.5–5.1)
Sodium: 138 mEq/L (ref 135–145)
Total Bilirubin: 0.4 mg/dL (ref 0.3–1.2)

## 2011-05-18 LAB — TRIGLYCERIDES: Triglycerides: 114 mg/dL (ref <150)

## 2011-05-18 LAB — PREALBUMIN: Prealbumin: 13.6 mg/dL — ABNORMAL LOW (ref 17.0–34.0)

## 2011-05-18 LAB — PHOSPHORUS: Phosphorus: 3.9 mg/dL (ref 2.3–4.6)

## 2011-05-18 MED ORDER — PANTOPRAZOLE SODIUM 40 MG IV SOLR
40.0000 mg | INTRAVENOUS | Status: DC
Start: 2011-05-18 — End: 2011-05-21
  Administered 2011-05-18 – 2011-05-21 (×4): 40 mg via INTRAVENOUS
  Filled 2011-05-18 (×4): qty 40

## 2011-05-18 MED ORDER — TRACE MINERALS CR-CU-MN-SE-ZN 10-1000-500-60 MCG/ML IV SOLN
INTRAVENOUS | Status: AC
  Administered 2011-05-18: 18:00:00 via INTRAVENOUS
  Filled 2011-05-18: qty 2000

## 2011-05-18 MED ORDER — FAT EMULSION 20 % IV EMUL
250.0000 mL | INTRAVENOUS | Status: AC
  Administered 2011-05-18: 250 mL via INTRAVENOUS
  Filled 2011-05-18: qty 250

## 2011-05-18 MED ORDER — IOHEXOL 300 MG/ML  SOLN
100.0000 mL | Freq: Once | INTRAMUSCULAR | Status: AC | PRN
  Administered 2011-05-18: 100 mL via INTRAVENOUS

## 2011-05-18 NOTE — Progress Notes (Addendum)
Patient ID: Krystal [name], female   DOB: 20-Oct-1970, 41 y.o.   MRN: [id number]  Interim Summary:  41 year old female admitted 19 days ago for partial small bowel obstruction secondary to endometrial carcinoma. G tube placed 05/14/2011 successfully and patient has required an ongoing drainage. Clamping G tube causes intractable nausea and vomiting. At this time we are awaiting evaluation by OB/Gyn oncology to see if patient may start chemotherapy. As per surgical recommendations exploratory laparotomy would be the last resort. In regards to nutrition, per case management, TNA donation made so patient would continue having this form of nutrition once discharged.  Consults to date: 1. Oncology (Dr. Darrold Span) 2. Surgery 3. Interventional radiology  Assessment/Plan:   Principal Problem:   *Partial small bowel obstruction  - Surgical consultation performed by Dr. Lodema Pilot on 05/03/11: Surgical intervention felt to be only used as a last resort.  - Gastric tube drainage still continuing; 600 cc drained 05/15/2011; 900 cc 05/17/2011 and 600 cc 05/18/2011 - as per surgery ex lap would be the last option - TNA started for temporary nutritional purposes   Active Problems:   Endometrial Ca / Ascites  - Status post cycle 1 cisplatin/Adriamycin.  - Paracentesis done on 05/12/2011 and most recently 05/18/2011 with 3 L yellow fluid removed  - CT abdomen/pelvis repeated today 05/18/2011 and we will follow up with oncology if chemotherapy is an option - awaiting evaluation by Gyn oncology (will see the patient 5/7 or 5/8)  Neutropenia / thrombocytopenia  - Related to prior chemotherapy.  - Status post treatment with Neupogen, now discontinued.  - WBC and PLT count within normal limits at this time   Chronic constipation  - continue bisacodyl PR TID   Inadequate oral intake  - TNA per pharmacy   Hypertension  - BP 135/90 - continue clonidine patch   Acute renal failure/mild hydronephrosis  -  Creatinine slightly up from yesterday - creatinine today 1.14 - follow up BMP in am  Normocytic anemia  - secondary to chronic disease secondary to malignancy - status post 2 units PRBCs transfusion 05/16/2011 with appropriate rise in hemoglobin from 7.8 to 9.4   Fever  - patient spiked fever while on zosyn in past 48 hours - patient was started on vancomycin 05/15/2011 - as there is no obvious source of infection we will discontinue all antibiotics at this time  Hypokalemia  - secondary to GI losses, G tube decompression  - potassium remains within normal limits today  Disposition - discharge plan contingent on Gyn oncology and surgery evaluation. If patient's extensive carcinoma is not amenable to surgery and chemotherapy is not an option at this time then patient may go to SNF with hospice, choice would be [facility]  (unless patient desires going home with hospice)  Other consultants:  Pharmacy - for TNA and antibiotic dosing management  Antibiotics: Zosyn 05/12/2011---> 05/18/2011 Vancomycin 05/15/2011 -->05/18/2011  Subjective: No events overnight. Patient continues to have nausea and vomiting more pronounced with G tube clamping.  Objective:  Vital signs in last 24 hours:  Filed Vitals:   05/18/11 1216 05/18/11 1220 05/18/11 1225 05/18/11 1431  BP: 132/88 136/95 136/94 135/90  Pulse:    91  Temp:    98.1 F (36.7 C)  TempSrc:    Oral  Resp:    23  Height:      Weight:      SpO2:        Intake/Output from previous day:  Intake/Output Summary (Last  24 hours) at 05/18/11 1547 Last data filed at 05/18/11 0559  Gross per 24 hour  Intake 2025.2 ml  Output    500 ml  Net 1525.2 ml    Physical Exam: General: Alert, awake, oriented x3, in no acute distress. HEENT: No bruits, no goiter. Moist mucous membranes, no scleral icterus, no conjunctival pallor. Heart: Regular rate and rhythm, S1/S2 +, no murmurs, rubs, gallops. Lungs: Clear to auscultation bilaterally. No  wheezing, no rhonchi, no rales.  Abdomen: distended and tender to palpation over mid to lower abdomen without rebound tenderness or guarding. Extremities: No clubbing or cyanosis, no pitting edema,  positive pedal pulses. Neuro: Grossly nonfocal.  Lab Results:  Lab 05/17/11 0620 05/16/11 1150 05/12/11 0406  WBC 9.6 9.6 5.6  HGB 9.4* 7.8* 9.5*  HCT 29.0* 25.5* 30.5*  PLT 376 457* 267  MCV 84.3 82.8 82.9    Lab 05/18/11 0355 05/17/11 0620 05/16/11 0520 05/15/11 0540 05/14/11 0600  NA 138 137 136 133* 132*  K 3.9 3.5 3.5 3.5 3.1*  CL 105 104 102 98 95*  CO2 22 23 24 26 27   GLUCOSE 126* 140* 139* 109* 121*  BUN 24* 25* 27* 29* 33*  CREATININE 1.14* 1.07 1.13* 1.32* 1.37*  CALCIUM 9.0 8.8 8.5 8.4 8.7    Lab 05/12/11 0406  INR 0.93    Studies/Results: Ct Abdomen Pelvis W Contrast 05/18/2011 IMPRESSION:  Increase in mid/distal small bowel dilatation to the point of probable adhesion subjacent to the anterior midline abdominal wall. Distal passage of contrast suggests an element of partial obstruction.  Increased ascites and presumed peritoneal metastatic burden.  Increased left pleural effusion.  Left renal hypo enhancement and lack of normal excretion, with probable retroperitoneal obstructive soft tissue implant versus metastatic lymph node.   M.D.    Medications: Scheduled Meds:   . bisacodyl  10 mg Rectal TID  . cloNIDine  0.2 mg Transdermal Weekly  . enoxaparin  40 mg Subcutaneous Q24H  . insulin aspart  0-9 Units Subcutaneous Q6H  . pantoprazole (PROTONIX) IV  40 mg Intravenous Q24H  . 0.9 % sodium chloride with kcl   Intravenous Q24H   Continuous Infusions:   . TPN (CLINIMIX) +/- additives     And  . fat emulsion 250 mL (05/17/11 1809)  . TPN (CLINIMIX) +/- additives     And  . fat emulsion     PRN Meds:.acetaminophen, albuterol, guaiFENesin-dextromethorphan, HYDROmorphone (DILAUDID) injection, HYDROmorphone (DILAUDID) injection, iohexol, ipratropium, LORazepam,  ondansetron (ZOFRAN) IV, oxyCODONE, phenol, promethazine, sodium phosphate    LOS: 19 days   [name] 05/18/2011, 3:47 PM  [facility] Pager: [phone]

## 2011-05-18 NOTE — Procedures (Signed)
US guided therapeutic paracentesis performed yielding 3 liters yellow fluid. No immediate complications. 

## 2011-05-18 NOTE — Progress Notes (Signed)
  05/18/2011, 8:36 AM  Hospital day: 20 Antibiotics: ~ day 7 zosyn. Also on Vancomycin since 05-16-11?? No positive cultures that I can find now. Chemotherapy: day 22 cycle 1 adriamycin/CDDP    Subjective: Has not tolerated G tube clamped when tried for short time yesterday. Abdomen more full and uncomfortable today. Tiny amounts of stool with suppositories, has not had enema in past 3 days. Phenergan and ativan most helpful for nausea. Tried to walk Saturday. Mother, brother, sister in law, Mr.Moxon all here. Sister in law also speaks [place]. Staff arranging interpreter for ~ 3 pm today. I have recommended repeat CT today (IV contrast ok, no oral contrast). Will also request US paracentesis Drs Nelly Rout and Duard Brady are to be in [place] on 5-7 and 05-20-11 and I have requested reconsult.  I spoke with case manager by phone x2 on Friday 5-3. No notes that I can find from her now, however she was looking into indigent coverage for TNA. I have NOT discussed outpatient TNA with patient, as there may be no way to obtain this.   Dr.Devine and I have spoken at bedside now. Objective: Vital signs in last 24 hours: Blood pressure 123/81, pulse 87, temperature 99.5 F (37.5 C), temperature source Oral, resp. rate 16, height 5\' 6"  (1.676 m), weight 205 lb (92.987 kg), last menstrual period 07/02/2010, SpO2 98.00%.   Intake/Output from previous day: 05/05 0701 - 05/06 0700 In: 6034.2 [I.V.:1827; IV Piggyback:437.7; TPN:3769.5] Out: 500 [Urine:500] Intake/Output this shift:    Physical exam:Awake and alert, looks somewhat uncomfortable, crying as we discuss situation. NG with dark green drainage. Mouth moist. PERRL, not icteric. PAC fine. Peripheral IV LUE. Abdomen more distended, quiet, not more tender. LE no edema, cords. Moves easily in bed. Lungs clear ant/ lat. Cor RRR.  Lab Results:  Basename 05/17/11 0620 05/16/11 1150  WBC 9.6 9.6  HGB 9.4* 7.8*  HCT 29.0* 25.5*  PLT 376 457*    BMET  Basename 05/18/11 0355 05/17/11 0620  NA 138 137  K 3.9 3.5  CL 105 104  CO2 22 23  GLUCOSE 126* 140*  BUN 24* 25*  CREATININE 1.14* 1.07  CALCIUM 9.0 8.8    Studies/Results: No results found. CT AP and US paracentesis ordered.  Assessment/Plan: 1.Endometrial carcinoma: persistent bowel obstruction requiring G tube to drainage continuously. Could retreat with chemo this week if gyn oncology thinks appropriate. Will repeat CT hopefully today to assist with decision making. Patient has wanted to continue any treatment possible. US paracentesis if significant ascites, as this did help symptoms when done previously. 2.Consider stopping antibiotics if not needed 3.Social concerns: not clear what can be continued out of hospital, as all of care is indigent assistance/ she is not eligible for any insurance as not [place] citizen. Note mother was nurse.   Brandis Wixted P

## 2011-05-18 NOTE — Progress Notes (Signed)
PARENTERAL NUTRITION CONSULT NOTE - Follow-Up  Pharmacy Consult for TNA Indication: pSBO  No Known Allergies  Patient Measurements: Height: 5\' 6"  (167.6 cm) Weight: 205 lb (92.987 kg) IBW/kg (Calculated) : 59.3  Adjusted Body Weight: 69 kg  Vital Signs: Temp: 99.5 F (37.5 C) (05/06 0551) Temp src: Oral (05/06 0551) BP: 123/81 mmHg (05/06 0551) Pulse Rate: 87  (05/06 0551) Intake/Output from previous day: 05/05 0701 - 05/06 0700 In: 6034.2 [I.V.:1827; IV Piggyback:437.7; TPN:3769.5] Out: 500 [Urine:500] Intake/Output from this shift:    Labs:  [facility] 05/17/11 0620 05/16/11 1150  WBC 9.6 9.6  HGB 9.4* 7.8*  HCT 29.0* 25.5*  PLT 376 457*  APTT -- --  INR -- --     Basename 05/18/11 0355 05/18/11 0353 05/17/11 0620 05/16/11 0520  NA 138 -- 137 136  K 3.9 -- 3.5 3.5  CL 105 -- 104 102  CO2 22 -- 23 24  GLUCOSE 126* -- 140* 139*  BUN 24* -- 25* 27*  CREATININE 1.14* -- 1.07 1.13*  LABCREA -- -- -- --  CREAT24HRUR -- -- -- --  CALCIUM 9.0 -- 8.8 8.5  MG 1.7 -- -- --  PHOS 3.9 -- 2.3 2.8  PROT 6.2 -- -- --  ALBUMIN 2.1* -- -- --  AST 16 -- -- --  ALT 21 -- -- --  ALKPHOS 91 -- -- --  BILITOT 0.4 -- -- --  BILIDIR -- -- -- --  IBILI -- -- -- --  PREALBUMIN 13.6* -- -- --  TRIG -- 114 -- --  CHOLHDL -- -- -- --  CHOL -- 153 -- --   Estimated Creatinine Clearance: 73.1 ml/min (by C-G formula based on Cr of 1.14).    Basename 05/18/11 0614 05/17/11 2358 05/17/11 1804  GLUCAP 142* 122* 78    Medical History: Past Medical History  Diagnosis Date  . Hypertension     SINCE FIRST PREGNANCY  . Asthma   . Constipation   . History of miscarriage   . Preeclampsia   . Postoperative ileus 07/2010  . Iron deficiency anemia     s/p transfusions [facility]  . Uterine cancer     Medications:  Scheduled:     . bisacodyl  10 mg Rectal TID  . cloNIDine  0.2 mg Transdermal Weekly  . enoxaparin  40 mg Subcutaneous Q24H  . insulin aspart  0-9 Units Subcutaneous  Q6H  . pantoprazole (PROTONIX) IV  40 mg Intravenous Q24H  . piperacillin-tazobactam (ZOSYN)  IV  3.375 g Intravenous Q8H  . 0.9 % sodium chloride with kcl   Intravenous Q24H  . vancomycin  1,250 mg Intravenous Q12H   Infusions:     . TPN (CLINIMIX) +/- additives     And  . fat emulsion 250 mL (05/17/11 1809)  . TPN (CLINIMIX) +/- additives     And  . fat emulsion      Insulin Requirements in the past 24 hours:  CBGs 136-78 / 24 hr.  2 units SSI given. CBG 142 this am, TNA to stop at 10am.  Current Nutrition:  NPO  ClinimixE 5/15 cyclic delivered over 16hrs. Fat Emulsion 20% at 79ml/hr over 16hrs. NS w/ KCl/L @ 32ml/hr infused 10a-6p while TNA off  Nutritional Goals:  Per RD: Kcal goals: 1750-2050/day, Protein 70-90g/day, Fluid 1.7-2L/day. Clinimix 5/15: at goal of 1959ml/day and fat emulsion 20% 255ml/day = 1843kcal/day and 96g protein/day  Assessment:  41 yo female with past medical history of endometrial cancer admitted  on 4/17 with abdominal cramping and a partial SBO.  Being managed medically, laparotomy only if necessary/surgery.  TNA was started 4/25, increased to goal 4/28, started wean 4/29 but unable to tolerate PO so increased back to goal 4/30. Added electrolytes back to formula 5/5, lytes held d/t elevated Phos. Changed to cyclic/nocturnal 5/3.   CBG within goal <150  Chemistries: improved, stable. SCr normalized.  Note urine output not measured.  Lipids wnl (TG 114, 141).  Prealbumin - 11.8(4/29). Pending today.  G tube to continuous drain, tolerating only short clamping of G tube (walking). May undergo paracentesis.  Plan:   Cont same cyclic TNA with electrolyte-containing formula: Clinimix E5/15 and IVFE 20% at cyclic rate to deliver goal volume over 16hrs.  Plan to transition to 12hr cyclic rate as tolerated.  MVI and trace elements on MWF only due to ongoing shortage.  Continue NS w/ KCl/L @75ml /hr BUT hold while TNA is  infusing.   Continue SSI q6h  CT scan of abdomen today, IV contrast, no oral contreast.  Possible continuation of chemo as outpatient, await decision Gyn oncology.  No palliative decisions yet, Mother here, mtg today, needs French-speaking interpreter. Decisions include welfare of children.  Possible outpt TNA with [facility] (possible [facility])  Otho Bellows PharmD pager [phone].  05/18/2011,10:25 AM.

## 2011-05-18 NOTE — Progress Notes (Signed)
Patient ID: Krystal [name], female   DOB: [birth year], 41 y.o.   MRN: [id number] Subjective: nause and emesis when g-tube clamped No flatus crampy abd pain  Objective: Physical Exam: BP 123/81  Pulse 87  Temp(Src) 99.5 F (37.5 C) (Oral)  Resp 16  Ht 5\' 6"  (1.676 m)  Wt 205 lb (92.987 kg)  BMI 33.09 kg/m2  SpO2 98%  LMP 07/02/2010 Abdomen distended, minimally tender    Labs: CBC  Basename 05/17/11 0620 05/16/11 1150  WBC 9.6 9.6  HGB 9.4* 7.8*  HCT 29.0* 25.5*  PLT 376 457*   BMET  Basename 05/18/11 0355 05/17/11 0620  NA 138 137  K 3.9 3.5  CL 105 104  CO2 22 23  GLUCOSE 126* 140*  BUN 24* 25*  CREATININE 1.14* 1.07  CALCIUM 9.0 8.8   LFT  Basename 05/18/11 0355  PROT 6.2  ALBUMIN 2.1*  AST 16  ALT 21  ALKPHOS 91  BILITOT 0.4  BILIDIR --  IBILI --  LIPASE --   PT/INR No results found for this basename: LABPROT:2,INR:2 in the last 72 hours   Studies/Results: No results found.  Assessment/Plan: Carcinomatosis with bowel obstruction  Exploratory lap only as a last resort Continue current care    LOS: 19 days    Shayon Trompeter A MD 05/18/2011 9:21 AM

## 2011-05-19 ENCOUNTER — Other Ambulatory Visit: Payer: Self-pay | Admitting: Oncology

## 2011-05-19 DIAGNOSIS — K56609 Unspecified intestinal obstruction, unspecified as to partial versus complete obstruction: Secondary | ICD-10-CM

## 2011-05-19 DIAGNOSIS — E43 Unspecified severe protein-calorie malnutrition: Secondary | ICD-10-CM

## 2011-05-19 DIAGNOSIS — R112 Nausea with vomiting, unspecified: Secondary | ICD-10-CM

## 2011-05-19 DIAGNOSIS — C549 Malignant neoplasm of corpus uteri, unspecified: Secondary | ICD-10-CM

## 2011-05-19 LAB — GLUCOSE, CAPILLARY: Glucose-Capillary: 138 mg/dL — ABNORMAL HIGH (ref 70–99)

## 2011-05-19 MED ORDER — ONDANSETRON HCL 4 MG PO TABS: 4.0000 mg | ORAL_TABLET | ORAL | Status: DC | PRN

## 2011-05-19 MED ORDER — PROMETHAZINE HCL 25 MG/ML IJ SOLN
25.0000 mg | INTRAMUSCULAR | Status: DC | PRN
  Administered 2011-05-20 – 2011-05-21 (×4): 25 mg via INTRAVENOUS
  Filled 2011-05-19 (×4): qty 1

## 2011-05-19 MED ORDER — HEPARIN SOD (PORK) LOCK FLUSH 100 UNIT/ML IV SOLN: 250.0000 [IU] | Freq: Once | INTRAVENOUS | Status: AC | PRN

## 2011-05-19 MED ORDER — SODIUM CHLORIDE 0.9 % IJ SOLN: 10.0000 mL | INTRAMUSCULAR | Status: DC | PRN

## 2011-05-19 MED ORDER — FAT EMULSION 20 % IV EMUL
250.0000 mL | INTRAVENOUS | Status: AC
  Administered 2011-05-19: 250 mL via INTRAVENOUS
  Filled 2011-05-19: qty 250

## 2011-05-19 MED ORDER — CLINIMIX E/DEXTROSE (5/15) 5 % IV SOLN
INTRAVENOUS | Status: AC
  Administered 2011-05-19: 18:00:00 via INTRAVENOUS
  Filled 2011-05-19: qty 2000

## 2011-05-19 MED ORDER — ALTEPLASE 2 MG IJ SOLR
2.0000 mg | Freq: Once | INTRAMUSCULAR | Status: AC | PRN
  Filled 2011-05-19: qty 2

## 2011-05-19 MED ORDER — SODIUM CHLORIDE 0.9 % IV SOLN: Freq: Once | INTRAVENOUS | Status: DC

## 2011-05-19 MED ORDER — SODIUM CHLORIDE 0.9 % IJ SOLN: 3.0000 mL | INTRAMUSCULAR | Status: DC | PRN

## 2011-05-19 MED ORDER — HEPARIN SOD (PORK) LOCK FLUSH 100 UNIT/ML IV SOLN: 500.0000 [IU] | Freq: Once | INTRAVENOUS | Status: AC | PRN

## 2011-05-19 MED ORDER — LORAZEPAM 0.5 MG PO TABS
0.5000 mg | ORAL_TABLET | Freq: Three times a day (TID) | ORAL | Status: DC | PRN
  Administered 2011-05-19 – 2011-05-21 (×3): 0.5 mg via SUBLINGUAL
  Filled 2011-05-19 (×3): qty 1

## 2011-05-19 NOTE — Progress Notes (Signed)
Nutrition Follow-up  Diet Order: NPO  TNA: Clinimix E 5/15 cyclic for 16 hours providing with 20% fat emulsion cyclic for 16 hours at 20ml/hr - provides 1843 calories, 96g protein - meets 108% minimum estimated calorie needs, 137% minimum estimated protein needs  - G tube unable to be placed last week r/t barium in stomach. Pt with 2L of yellow-green ascites removed using paracentesis on 4/30. Pt had G tube placed on 5/2 which was put on wall suction. Pt changed to cyclic TNA on 5/3 in the hopes of d/c with TNA on 5/4. NGT out 5/3. Pt unable to tolerate G tube clamping earlier in the week. MD reports phenergan and ativan helpful for pt for nausea. Pt with 3L of yellow fluid removed using paracentesis on 5/6. MD currently awaiting evaluation by OB/GYN oncology to see if pt should resume chemotherapy. Surgery recommends exploratory laparotomy as last resort.   - Met with pt who stated today was "not good". Pt c/o nausea and reported 2 episodes of emesis - however nurse tech reported they were 2 episodes of dry heaves with small amounts of phlegm produced. Pt given Ativan and Dulcolax this morning. Noted Phenergan d/c today, unsure why.   CT of abdomen/pelvis yesterday showed: Increase in mid/distal small bowel dilatation to the point of  probable adhesion subjacent to the anterior midline abdominal wall. Distal passage of contrast suggests an element of partial obstruction. Increased ascites and presumed peritoneal metastatic burden. Increased left pleural effusion. Left renal hypo enhancement and lack of normal excretion, with probable retroperitoneal obstructive soft tissue implant versus metastatic lymph node.   Meds: Scheduled Meds:   . bisacodyl  10 mg Rectal TID  . cloNIDine  0.2 mg Transdermal Weekly  . enoxaparin  40 mg Subcutaneous Q24H  . insulin aspart  0-9 Units Subcutaneous Q6H  . pantoprazole (PROTONIX) IV  40 mg Intravenous Q24H  . 0.9 % sodium chloride with kcl    Intravenous Q24H  . DISCONTD: piperacillin-tazobactam (ZOSYN)  IV  3.375 g Intravenous Q8H  . DISCONTD: vancomycin  1,250 mg Intravenous Q12H   Continuous Infusions:   . TPN (CLINIMIX) +/- additives     And  . fat emulsion 250 mL (05/17/11 1809)  . TPN (CLINIMIX) +/- additives     And  . fat emulsion 250 mL (05/18/11 1746)  . TPN (CLINIMIX) +/- additives     And  . fat emulsion     PRN Meds:.acetaminophen, albuterol, guaiFENesin-dextromethorphan, HYDROmorphone (DILAUDID) injection, HYDROmorphone (DILAUDID) injection, ipratropium, LORazepam, ondansetron (ZOFRAN) IV, ondansetron, oxyCODONE, phenol, sodium phosphate, DISCONTD: LORazepam, DISCONTD: promethazine  Labs:  CMP     Component Value Date/Time   NA 138 05/18/2011 0355   K 3.9 05/18/2011 0355   CL 105 05/18/2011 0355   CO2 22 05/18/2011 0355   GLUCOSE 126* 05/18/2011 0355   BUN 24* 05/18/2011 0355   CREATININE 1.14* 05/18/2011 0355   CREATININE 0.55 10/17/2009 1300   CALCIUM 9.0 05/18/2011 0355   PROT 6.2 05/18/2011 0355   ALBUMIN 2.1* 05/18/2011 0355   AST 16 05/18/2011 0355   ALT 21 05/18/2011 0355   ALKPHOS 91 05/18/2011 0355   BILITOT 0.4 05/18/2011 0355   GFRNONAA 58* 05/18/2011 0355   GFRAA 67* 05/18/2011 0355   CBG (last 3)   Basename 05/19/11 1225 05/19/11 0602 05/18/11 2359  GLUCAP 99 138* 118*   - CBGs <150 mg/dL - PALB improved from 11.9 mg/dL on 1/47 to 82.9 mg/dL on 5/6   Intake/Output Summary (Last  24 hours) at 05/19/11 1409 Last data filed at 05/18/11 2300  Gross per 24 hour  Intake      0 ml  Output   1700 ml  Net  -1700 ml   Last BM - 05/17/11 G tube drain - 1,016ml output total yesterday  Weight Status: No new weights.   Re-estimated needs: No changes. 1750-2050 calories 70-90g protein  Nutrition Dx: Inadequate oral intake - ongoing.  Goal: TNA to meet >90% of estimated nutritional needs - met.   Intervention: TNA per pharmacy. Recommend re-weigh pt. Anti-emetics and bowel regimen per MD.   Monitor: Weights,  labs, nausea, emesis, BM, TNA   Marshall Cork Pager #: [phone]

## 2011-05-19 NOTE — Plan of Care (Signed)
Problem: Inadequate Intake (NI-2.1) Goal: Food and/or nutrient delivery Individualized approach for food/nutrient provision.  Outcome: Not Progressing Remains NPO, relying on TNA for nutrition      

## 2011-05-19 NOTE — Progress Notes (Signed)
PARENTERAL NUTRITION CONSULT NOTE - Follow-Up  Pharmacy Consult for TNA Indication: pSBO  No Known Allergies  Patient Measurements: Height: 5\' 6"  (167.6 cm) Weight: 205 lb (92.987 kg) IBW/kg (Calculated) : 59.3  Adjusted Body Weight: 69 kg  Vital Signs: Temp: 99.5 F (37.5 C) (05/07 0603) Temp src: Oral (05/07 0603) BP: 114/80 mmHg (05/07 0603) Pulse Rate: 94  (05/07 0603) Intake/Output from previous day: 05/06 0701 - 05/07 0700 In: -  Out: 1700 [Urine:700; Drains:1000] Intake/Output from this shift:    Labs:  [facility] 05/17/11 0620 05/16/11 1150  WBC 9.6 9.6  HGB 9.4* 7.8*  HCT 29.0* 25.5*  PLT 376 457*  APTT -- --  INR -- --     Basename 05/18/11 0355 05/18/11 0353 05/17/11 0620  NA 138 -- 137  K 3.9 -- 3.5  CL 105 -- 104  CO2 22 -- 23  GLUCOSE 126* -- 140*  BUN 24* -- 25*  CREATININE 1.14* -- 1.07  LABCREA -- -- --  CREAT24HRUR -- -- --  CALCIUM 9.0 -- 8.8  MG 1.7 -- --  PHOS 3.9 -- 2.3  PROT 6.2 -- --  ALBUMIN 2.1* -- --  AST 16 -- --  ALT 21 -- --  ALKPHOS 91 -- --  BILITOT 0.4 -- --  BILIDIR -- -- --  IBILI -- -- --  PREALBUMIN 13.6* -- --  TRIG -- 114 --  CHOLHDL -- -- --  CHOL -- 153 --   Estimated Creatinine Clearance: 73.1 ml/min (by C-G formula based on Cr of 1.14).    Basename 05/19/11 0602 05/18/11 2359 05/18/11 1953  GLUCAP 138* 118* 125*    Medical History: Past Medical History  Diagnosis Date  . Hypertension     SINCE FIRST PREGNANCY  . Asthma   . Constipation   . History of miscarriage   . Preeclampsia   . Postoperative ileus 07/2010  . Iron deficiency anemia     s/p transfusions [facility]  . Uterine cancer     Medications:  Scheduled:     . bisacodyl  10 mg Rectal TID  . cloNIDine  0.2 mg Transdermal Weekly  . enoxaparin  40 mg Subcutaneous Q24H  . insulin aspart  0-9 Units Subcutaneous Q6H  . pantoprazole (PROTONIX) IV  40 mg Intravenous Q24H  . 0.9 % sodium chloride with kcl   Intravenous Q24H  . DISCONTD:  piperacillin-tazobactam (ZOSYN)  IV  3.375 g Intravenous Q8H  . DISCONTD: vancomycin  1,250 mg Intravenous Q12H   Infusions:     . TPN (CLINIMIX) +/- additives     And  . fat emulsion 250 mL (05/17/11 1809)  . TPN (CLINIMIX) +/- additives     And  . fat emulsion 250 mL (05/18/11 1746)  . TPN (CLINIMIX) +/- additives     And  . fat emulsion      Insulin Requirements in the past 24 hours:  CBGs 138-125 while TNA infusing.  Current Nutrition:  NPO  ClinimixE 5/15 cyclic delivered over 16hrs. Fat Emulsion 20% at 81ml/hr over 16hrs. NS w/ KCl/L @ 71ml/hr infused 10a-6p while TNA off  Nutritional Goals:  Per RD: Kcal goals: 1750-2050/day, Protein 70-90g/day, Fluid 1.7-2L/day. Clinimix 5/15: at goal of 19104ml/day and fat emulsion 20% 273ml/day = 1843kcal/day and 96g protein/day  Assessment:  41 yo female with past medical history of endometrial cancer admitted on 4/17 with abdominal cramping and a partial SBO.  Being managed medically, laparotomy only if necessary/surgery.  TNA was started  4/25, increased to goal 4/28, started wean 4/29 but unable to tolerate PO so increased back to goal 4/30. Added electrolytes back to formula 5/5, lytes were held d/t elevated Phos. Changed to cyclic/nocturnal 5/3.   CBG within goal <150  Chemistries: WNL  Note urine output not measured.  Lipids wnl (TG 114, 141).  Prealbumin - 11.8(4/29). Pending today.  G tube to continuous drain 1000 ml/24 hr, nausea when tube clamped, using Ativan SL. Paracentesis 5/6 = 3L removed.  Plan:   Cont same cyclic TNA with electrolyte-containing formula: Clinimix E5/15 and IVFE 20% at cyclic rate to deliver goal volume over 16hrs.  Plan to transition to 12hr cyclic rate as tolerated.  MVI and trace elements on MWF only due to ongoing shortage.  Continue NS w/ KCl/L @75ml /hr BUT hold while TNA is infusing.   Continue SSI q6h  CT scan of abdomen cancelled.  Possible continuation of  chemo as outpatient, await decision Gyn oncology.  Expl lap would be last resort   Otho Bellows PharmD pager [phone].  05/19/2011,11:41 AM.

## 2011-05-19 NOTE — Consult Note (Signed)
CC:  Recurrent uterine serous cancer.  HPI::This is a lovely lady who  underwent a total abdominal hysterectomy, bilateral salpingo-oophorectomy, pelvic and periaortic lymphadenectomy, and omentectomy on July 08, 2010. 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 Which was poorly differentiated with deep myometrial involvement, involvement of the cervix and adnexal as well as 9 of 34 pelvic and periaortic lymph nodes. Sandwich Chemotherapy with carboplatin and taxol and external radiation therapy was given postop.   Treatment was completed 01/2011.  Recurrent disease to the chest was noted 03-23-2011 on PET , a1.4 and 1.5 cm in chest with SUV 7.6 - 12.7, a 1.8 cm gastrohepatic node with SUV 14.2, a left common iliac 8mm node with SUV 8.1 and ill-defined soft tissue in anterior mediastinum likely residual thymus 2 x 3.6 cm with SUV 5.7.   The patient received 1 cycle CDDP/Adriamycin  04/2011 and was subsequently admitted 04/28/2028 with complaints of abdominal cramping.  She was diagnosed with a partial small bowel obstruction that has not resolved.  A GTube was placed because she was poorly tolerant of the NGT.    Paracentesis x 2 has been required during this hospitalization. TPN started 05/07/2011.  Patient reports re-accumulation of fluid, unable to leave GT unclamped without developing nausea.  Due for cycle #2 of chemotherapy.   . Past Medical History  Diagnosis Date  . Hypertension     SINCE FIRST PREGNANCY  . Asthma   . Constipation   . History of miscarriage   . Preeclampsia   . Postoperative ileus 07/2010  . Iron deficiency anemia     s/p transfusions [facility]  . Uterine cancer    Past Surgical History  Procedure Date  . Cesarean section   . Abdominal hysterectomy 07-08-10     TAH-BSO, WITH OMENTECTOMY AND BILAT. PELVIC AND PERIAORTIC LYMPH NODE DISSECTION  . Appendectomy     IN CHILDHOOD   History   Social History  . Marital Status: Single    Spouse Name: N/A    Number of  Children: N/A  . Years of Education: N/A   Occupational History  . Not on file.   Social History Main Topics  . Smoking status: Never Smoker   . Smokeless tobacco: Never Used  . Alcohol Use: Yes     occ.  . Drug Use: No  . Sexually Active: Not Currently     due to surgery   Other Topics Concern  . Not on file   Social History Narrative  . No narrative on file   REVIEW OF SYMPTOMS   REVIEW OF SYSTEMS Constitutional  Feels tired. Cardiovascular  No chest pain, mild  shortness of breath, Pulmonary  No cough or wheeze.  Gastro Intestinal  Nausea and  vomiting with G-tube unclamped. Genito Urinary  No frequency, urgency, dysuria,  Musculo Skeletal  No myalgia, arthralgia, joint swelling or pain    PHYSICAL EXAMINATION Blood pressure 114/80, pulse 102, temperature 98.8 F (37.1 C), temperature source Oral, resp. rate 16, height 5\' 6"  (1.676 m), weight 205 lb (92.987 kg), last menstrual period 07/02/2010, SpO2 97.00%.  Neck  Supple without any enlargements.  Cardiovascular  Pulse normal rate, regularity and rhythm.  Lungs  Decreased breath sounds at bases. Skin  No rash/lesions/breakdown  Psychiatry  Alert and oriented to person, place, and time  Abdomen  Distended c/w ascites. Soft, hypoactive BS.  Non tender, no erythema.  non-tender and obese. Surgical  sites intact without evidence of hernia. Pelvic:  Deferred. Extremities  No bilateral cyanosis, clubbing or edema. No rash, lesions or petiche.    ASSESSMENT. This is an unfortunate 41 y/o with recurrent serous uterine cancer.  The options presented to the patient  1. Supportive care 2.  Three cycles of chemotherapy with nutritional support.  Consideration may need to be given for change in agent based on electrolyte abnormalities and patient's performance status.   The poor prognosis was shared with the patient and her friends.  Ms. Stickle has three children the youngest is 51 year old.  The patient wishes to  explore all therapeutic options because of her young children. She opts for a trial of chemotherapy.   Case d/w with Dr. Darrold Span who informs me that there is a charity that may support TPN.

## 2011-05-19 NOTE — Progress Notes (Signed)
05/19/2011, 5:31 PM  Hospital day: 21 Antibiotics: none Chemotherapy: day 23 cycle 1 CDDP/adriamycin    Subjective: Patient seen, with mother, brother and friend here. Situation discussed by phone with Dr Elisabeth Pigeon this am and in person with Dr Nelly Rout this afternoon; Dr Forrestine Him consult note reviewed now. Dr Nelly Rout agrees that surgery is not appropriate from standpoint of the cancer. As patient strongly desires to continue treatment, Dr Nelly Rout recommends trying another 2 cycles of some chemotherapy if it is possible to support patient thru this out of hospital. I understand from Dr Elisabeth Pigeon that there will be availability of home TNA despite fact that patient has no insurance options. I have discussed chemotherapy with patient now and she wants to try this. As discharge planning for TNA and G tube management needs to be completed, I would like to give chemotherapy tomorrow in hospital as I do not expect the discharge arrangements can be done by then. I do not know if suction is possible for home G tube (?Gomco suction at home?) so will also try the G tube to gravity drain tonight. She will need SL ativan and phenergan suppositories for home antiemetics; she has some funds available from [facility].  Patient has had nausea today whenever G tube is clamped. She has taken only ice chips po in past 36 hrs. She had very small amount of stool with suppository earlier today, does have flatus with suppository; she will have enema tonight. She has some cough, difficult to breathe deeply and needs to try up in chair.  Objective: Vital signs in last 24 hours: Blood pressure 114/80, pulse 102, temperature 98.8 F (37.1 C), temperature source Oral, resp. rate 16, height 5\' 6"  (1.676 m), weight 205 lb (92.987 kg), last menstrual period 07/02/2010, SpO2 97.00%.   Intake/Output from previous day: 05/06 0701 - 05/07 0700 In: -  Out: 1700 [Urine:700; Drains:1000] Intake/Output this  shift: Total I/O In: -  Out: 1000 [Drains:1000]  Physical exam: alert, looks mildly uncomfortable in bed, follows conversation well. PERRL. Mouth clear. Lungs clear anteriorly. Abd not tight, large ventral hernia as before. NSL LUE. PAC site fine, TNA infusing. LE no edema or cords.  Lab Results:  [facility] 05/17/11 0620  WBC 9.6  HGB 9.4*  HCT 29.0*  PLT 376   BMET  Basename 05/18/11 0355 05/17/11 0620  NA 138 137  K 3.9 3.5  CL 105 104  CO2 22 23  GLUCOSE 126* 140*  BUN 24* 25*  CREATININE 1.14* 1.07  CALCIUM 9.0 8.8    Studies/Results: Ct Abdomen Pelvis W Contrast  05/18/2011  *RADIOLOGY REPORT*  Clinical Data: Abdominal pain, distention, nausea and vomiting. History of recurrent endometrial cancer.  Ongoing chemotherapy.  CT ABDOMEN AND PELVIS WITH CONTRAST  Technique:  Multidetector CT imaging of the abdomen and pelvis was performed following the standard protocol during bolus administration of intravenous contrast.  Contrast: OMNIPAQUE IOHEXOL 300 MG/ML  SOLN  Comparison: 04/29/2011  Findings: Small left pleural effusion is increased.  Plate-like bilateral lower lobe atelectasis again noted.  This is also increased.  Presumed Port-A-Cath tip terminates at the cavoatrial junction.  Stable hypodense right hepatic lobe lesions, for example image 18. These are too small for further characterization.  Overall increased now moderate ascites.  Left upper quadrant possible peritoneal nodularity and omental infiltration could suggest peritoneal metastasis.  None is individually discretely measurable.  Vicarious excretion of contrast or less likely sludge within the otherwise normal appearing gallbladder.  Differential  hypo enhancement of the left kidney as compared to the right with mild left hydronephrosis and hydroureter to the level of the pelvic inlet without visualized lesion.  Loculated fluid or necrotic metastatic lesion on image 66 adjacent to the left ureter could represent  focal transition point and is unchanged.  Interval placement of percutaneous gastrostomy tube. 1.6 cm gastrohepatic lymph node image 27 is unchanged or slightly smaller allowing for differences in technique.  Splenic surfaces minimally scalloped with adjacent ascites but no intra splenic lesion is identified.  Pancreas and adrenal glands are normal.  The right kidney is normal.  Rectus diastasis again noted.  Mid and distal small bowel dilatation is increased with tapering at the level of the ileum. Apparent transition point again noted subjacent to the anterior abdominal wall, image 71, possibly reflecting effusion.  Contrast does pass distally into the dilated colon.  Evidence of hysterectomy and oophorectomy.  Prior retroperitoneal nodal dissection.  Previously measured periaortic node measuring 7 mm image 50 is stable.  No new osseous abnormality.  IMPRESSION:  Increase in mid/distal small bowel dilatation to the point of probable adhesion subjacent to the anterior midline abdominal wall. Distal passage of contrast suggests an element of partial obstruction.  Increased ascites and presumed peritoneal metastatic burden.  Increased left pleural effusion.  Left renal hypo enhancement and lack of normal excretion, with probable retroperitoneal obstructive soft tissue implant versus metastatic lymph node.  Original Report Authenticated By: Harrel Lemon, M.D.   US Paracentesis  05/18/2011  *RADIOLOGY REPORT*  Clinical Data: Metastatic endometrial carcinoma, recurrent ascites; request is made for therapeutic paracentesis.  ULTRASOUND GUIDED THERAPEUTIC  PARACENTESIS  An ultrasound guided paracentesis was thoroughly discussed with the patient/patient's family  and questions answered.  The benefits, risks, alternatives and complications were also discussed.  The patient/patient's family understands and wishes to proceed with the procedure.  Written consent was obtained.  Ultrasound was performed to localize and  mark an adequate pocket of fluid in the right upper quadrant of the abdomen.  The area was then prepped and draped in the normal sterile fashion.  1% Lidocaine was used for local anesthesia.  Under ultrasound guidance a 19 gauge Yueh catheter was introduced.  Paracentesis was performed.  The catheter was removed and a dressing applied.  Complications:  none  Findings:  A total of approximately 3 liters of yellow fluid was removed.  IMPRESSION: Successful ultrasound guided therapeutic  paracentesis yielding 3 liters of ascites.  Read by: Jeananne Rama, P.A.-C  Original Report Authenticated By: Donavan Burnet, M.D.   Chemotherapy orders placed now for 05-20-11, single agent adriamycin.   G tube discussed with RNs now.  Assessment/Plan: 1.High grade, aggressive endometrial carcinoma: chemotherapy to be given in ongoing palliative attempt in this very difficult situation. I appreciate Dr.Brewster's thoughtful consultation today. 2.Bowel obstruction: needs G tube drainage continuously now. TNA necessary for nutrition short term, tho this will not be helpful if no response of cancer to chemotherapy. 3.PAC in 4. Chronic bowel motility problems since childhood 5.difficult social situation as she is not [place] citizen and has 3 very small children. Fortunately mother was able to come on emergent VISA. Hopefully DC arrangements can be in place for her to return home in next 2-3 days.  Mack Thurmon P

## 2011-05-19 NOTE — Progress Notes (Addendum)
Patient ID: Krystal [name], female   DOB: 1970/09/30, 41 y.o.   MRN: [id number]  Interim Summary:  41 year old female admitted 19 days ago for partial small bowel obstruction secondary to endometrial carcinoma. G tube placed 05/14/2011 successfully and patient has required an ongoing drainage. Clamping G tube causes intractable nausea and vomiting.  At this time evaluation by OB/Gyn oncology recommended patient start chemotherapy. As per surgical recommendations exploratory laparotomy would be the last resort.  In regards to nutrition, per case management, TNA donation made so patient would continue having this form of nutrition once discharged.  My prayers and thoughts are with the patient and her family.  Consults to date:  1. Oncology (Dr. Darrold Span)  2. Surgery  3. Interventional radiology  4. OB/Gyn oncology (Dr. Nelly Rout)  Assessment/Plan:   Principal Problem:   *Partial small bowel obstruction  - Surgical consultation performed by Dr. Lodema Pilot on 05/03/11: Surgical intervention felt to be only used as a last resort.  - Gastric tube drainage still continuing; 600 cc drained 05/15/2011; 900 cc 05/17/2011 and 600 cc 05/18/2011, ~500 cc 05/19/2011 - as per surgery ex lap would be the last option  - TNA started for temporary nutritional purposes   Active Problems:   Endometrial Ca / Ascites  - Status post cycle 1 cisplatin/Adriamycin.  - Paracentesis done on 05/12/2011 and most recently 05/18/2011 with 3 L yellow fluid removed  - CT abdomen/pelvis repeated today 05/18/2011 with findings as indicated below; as per OB/gyn onc plan to start chemotherapy although prognosis is poor  Neutropenia / thrombocytopenia  - Related to prior chemotherapy.  - Status post treatment with Neupogen, now discontinued.  - WBC and PLT count within normal limits at this time   Chronic constipation  - continue bisacodyl PR TID  - patient reports some bowel movement  Inadequate oral intake  - TNA per pharmacy    Hypertension  - BP 114/80 - continue clonidine patch   Acute renal failure/mild hydronephrosis   - secondary to dehydration/GI losses - creatinine 1.14 05/18/2011  Normocytic anemia  - secondary to chronic disease secondary to malignancy  - status post 2 units PRBCs transfusion 05/16/2011 with appropriate rise in hemoglobin from 7.8 to 9.4   Fever  - patient spiked fever while on zosyn in past 48 hours  - patient was started on vancomycin 05/15/2011  - as there is no obvious source of infection we will discontinue all antibiotics at this time   Hypokalemia  - secondary to GI losses, G tube decompression  - potassium remains within normal limits today  - follow up BMP in am  Disposition  - discharge plan likely home considering plan to start chemotherapy; anticipate d/c in next 48 hours - I have also mentioned to CM that if patient desires SNF placement this would have to be [facility] as [facility] which is the only option we can offer at this time as patient has no health insurance   Other consultants:  Pharmacy - for TNA and antibiotic dosing management   Antibiotics:  Zosyn 05/12/2011---> 05/18/2011  Vancomycin 05/15/2011 -->05/18/2011  Subjective: No events overnight. Patient still requiring G tube suction as she feels extremely nauseous and usually throws up if G tube is clamped.  Objective:  Vital signs in last 24 hours:  Filed Vitals:   05/18/11 1431 05/18/11 2213 05/19/11 0603 05/19/11 1433  BP: 135/90 109/73 114/80 114/80  Pulse: 91 90 94 102  Temp:  98.1 F (36.7 C) 100.1 F (37.8 C) 99.5 F (37.5 C) 98.8 F (37.1 C)  TempSrc: Oral Oral Oral Oral  Resp: 23 20 20 16   Height:      Weight:      SpO2:  97% 95% 97%    Intake/Output from previous day:   Intake/Output Summary (Last 24 hours) at 05/19/11 1720 Last data filed at 05/19/11 1100  Gross per 24 hour  Intake      0 ml  Output   2400 ml  Net  -2400 ml    Physical  Exam: General: Alert, awake, oriented x3, no acute distress. HEENT: No bruits, no goiter. Moist mucous membranes, no scleral icterus, no conjunctival pallor. Heart: Regular rate and rhythm, S1/S2 +, no murmurs, rubs, gallops. Lungs: Clear to auscultation bilaterally. No wheezing, no rhonchi, no rales.  Abdomen: tender over mid abdomen without rebound tenderness Extremities: No clubbing or cyanosis, no pitting edema,  positive pedal pulses. Neuro: Grossly nonfocal.  Lab Results:  Lab 05/17/11 0620 05/16/11 1150  WBC 9.6 9.6  HGB 9.4* 7.8*  HCT 29.0* 25.5*  PLT 376 457*  MCV 84.3 82.8    Lab 05/18/11 0355 05/17/11 0620 05/16/11 0520 05/15/11 0540 05/14/11 0600  NA 138 137 136 133* 132*  K 3.9 3.5 3.5 3.5 3.1*  CL 105 104 102 98 95*  CO2 22 23 24 26 27   GLUCOSE 126* 140* 139* 109* 121*  BUN 24* 25* 27* 29* 33*  CREATININE 1.14* 1.07 1.13* 1.32* 1.37*  CALCIUM 9.0 8.8 8.5 8.4 8.7    Studies/Results: Ct Abdomen Pelvis W Contrast 05/18/2011    IMPRESSION:  Increase in mid/distal small bowel dilatation to the point of probable adhesion subjacent to the anterior midline abdominal wall. Distal passage of contrast suggests an element of partial obstruction.  Increased ascites and presumed peritoneal metastatic burden.  Increased left pleural effusion.  Left renal hypo enhancement and lack of normal excretion, with probable retroperitoneal obstructive soft tissue implant versus metastatic lymph node.    US Paracentesis 05/18/2011  IMPRESSION: Successful ultrasound guided therapeutic  paracentesis yielding 3 liters of ascites.  Read by: Jeananne Rama, P.A.-C     Medications: Scheduled Meds:   . bisacodyl  10 mg Rectal TID  . cloNIDine  0.2 mg Transdermal Weekly  . enoxaparin  40 mg Subcutaneous Q24H  . insulin aspart  0-9 Units Subcutaneous Q6H  . pantoprazole (PROTONIX) IV  40 mg Intravenous Q24H  . 0.9 % sodium chloride with kcl   Intravenous Q24H   Continuous Infusions:   . TPN  (CLINIMIX) +/- additives     And  . fat emulsion 250 mL (05/17/11 1809)  . TPN (CLINIMIX) +/- additives     And  . fat emulsion 250 mL (05/18/11 1746)  . TPN (CLINIMIX) +/- additives     And  . fat emulsion     PRN Meds:.acetaminophen, albuterol, guaiFENesin-dextromethorphan, HYDROmorphone (DILAUDID) injection, HYDROmorphone (DILAUDID) injection, ipratropium, LORazepam, ondansetron (ZOFRAN) IV, ondansetron, oxyCODONE, phenol, sodium phosphate, DISCONTD: LORazepam, DISCONTD: promethazine    LOS: 20 days   Krystal [name] 05/19/2011, 5:20 PM  [facility] Pager: [phone]

## 2011-05-20 ENCOUNTER — Other Ambulatory Visit: Payer: Self-pay | Admitting: Oncology

## 2011-05-20 DIAGNOSIS — E43 Unspecified severe protein-calorie malnutrition: Secondary | ICD-10-CM

## 2011-05-20 DIAGNOSIS — K56609 Unspecified intestinal obstruction, unspecified as to partial versus complete obstruction: Secondary | ICD-10-CM

## 2011-05-20 DIAGNOSIS — R112 Nausea with vomiting, unspecified: Secondary | ICD-10-CM

## 2011-05-20 DIAGNOSIS — C549 Malignant neoplasm of corpus uteri, unspecified: Secondary | ICD-10-CM

## 2011-05-20 LAB — CBC
MCV: 84.8 fL (ref 78.0–100.0)
Platelets: 528 10*3/uL — ABNORMAL HIGH (ref 150–400)
RDW: 16.4 % — ABNORMAL HIGH (ref 11.5–15.5)
WBC: 11.2 10*3/uL — ABNORMAL HIGH (ref 4.0–10.5)

## 2011-05-20 LAB — DIFFERENTIAL
Basophils Absolute: 0 10*3/uL (ref 0.0–0.1)
Lymphocytes Relative: 8 % — ABNORMAL LOW (ref 12–46)
Lymphs Abs: 0.9 10*3/uL (ref 0.7–4.0)
Monocytes Relative: 12 % (ref 3–12)
Neutrophils Relative %: 80 % — ABNORMAL HIGH (ref 43–77)

## 2011-05-20 LAB — COMPREHENSIVE METABOLIC PANEL
ALT: 15 U/L (ref 0–35)
Calcium: 8.9 mg/dL (ref 8.4–10.5)
GFR calc Af Amer: 67 mL/min — ABNORMAL LOW (ref >90)
Glucose, Bld: 128 mg/dL — ABNORMAL HIGH (ref 70–99)
Sodium: 135 mEq/L (ref 135–145)
Total Protein: 7.2 g/dL (ref 6.0–8.3)

## 2011-05-20 LAB — GLUCOSE, CAPILLARY: Glucose-Capillary: 130 mg/dL — ABNORMAL HIGH (ref 70–99)

## 2011-05-20 MED ORDER — COLD PACK MISC ONCOLOGY
1.0000 | Freq: Once | Status: AC | PRN
  Filled 2011-05-20: qty 1

## 2011-05-20 MED ORDER — DOXORUBICIN HCL CHEMO IV INJECTION 2 MG/ML
60.0000 mg/m2 | Freq: Once | INTRAVENOUS | Status: AC
  Administered 2011-05-20: 124 mg via INTRAVENOUS
  Filled 2011-05-20: qty 62

## 2011-05-20 MED ORDER — SODIUM CHLORIDE 0.9 % IV SOLN
150.0000 mg | Freq: Once | INTRAVENOUS | Status: AC
  Administered 2011-05-20: 150 mg via INTRAVENOUS
  Filled 2011-05-20: qty 5

## 2011-05-20 MED ORDER — DEXAMETHASONE SODIUM PHOSPHATE 4 MG/ML IJ SOLN
12.0000 mg | Freq: Once | INTRAMUSCULAR | Status: AC
  Administered 2011-05-20: 12 mg via INTRAVENOUS
  Filled 2011-05-20: qty 3

## 2011-05-20 MED ORDER — ONDANSETRON HCL 4 MG PO TABS: 4.0000 mg | ORAL_TABLET | ORAL | Status: DC | PRN

## 2011-05-20 MED ORDER — PALONOSETRON HCL INJECTION 0.25 MG/5ML
0.2500 mg | Freq: Once | INTRAVENOUS | Status: AC
  Administered 2011-05-20: 0.25 mg via INTRAVENOUS
  Filled 2011-05-20: qty 5

## 2011-05-20 MED ORDER — FAT EMULSION 20 % IV EMUL
250.0000 mL | INTRAVENOUS | Status: AC
  Administered 2011-05-20: 250 mL via INTRAVENOUS
  Filled 2011-05-20 (×2): qty 250

## 2011-05-20 MED ORDER — ONDANSETRON 8 MG/NS 50 ML IVPB
8.0000 mg | Freq: Three times a day (TID) | INTRAVENOUS | Status: DC | PRN
  Filled 2011-05-20: qty 8

## 2011-05-20 MED ORDER — TRACE MINERALS CR-CU-MN-SE-ZN 10-1000-500-60 MCG/ML IV SOLN
INTRAVENOUS | Status: AC
  Administered 2011-05-20: 18:00:00 via INTRAVENOUS
  Filled 2011-05-20: qty 2000

## 2011-05-20 NOTE — Progress Notes (Signed)
PROGRESS NOTE  Krystal [name] ZOX:[id number] DOB: 05/12/1970 DOA: 04/29/2011 PCP: [name], MD, MD  Brief narrative: Krystal [name] is a 41 year old female admitted 04/29/11 for partial small bowel obstruction secondary to endometrial carcinoma. G tube placed 05/14/2011 successfully for symptom control and ongoing gastric decompression. Clamping G tube causes intractable nausea and vomiting. At this time evaluation by OB/Gyn oncology recommended Krystal [name] start chemotherapy. As per surgical recommendations exploratory laparotomy would be the last resort. In regards to nutrition, per case management, TNA donation made so Krystal [name] would continue having this form of nutrition once discharged.   Assessment/Plan: Principal Problem:  *Partial small bowel obstruction  Surgical consultation performed by Dr. Lodema Pilot on 05/03/11: Surgical intervention felt to be only used as a last resort.  Treated conservatively with bowel rest and NG tube decompression of the stomach.  A G-tube was placed by interventional radiology for symptom management on 05/14/2011.  TNA started for temporary nutritional purposes. Unable to wean yet. Active Problems:  Endometrial ca / Ascites  Status post cycle 1 cisplatin/Adriamycin.  Rapidly progressive per Dr. Precious Reel notes.  Paracentesis done on 05/12/2011 and 05/18/2011.  Dr. Darrold Span has requested GYN-ONC consultation with recommendations to proceed with chemotherapy.  I ordered a palliative care consult 05/13/2011 to help assist with goals of care and CODE STATUS given poor prognosis, and to address the Krystal [name]'s need to understand all of her options. This order was rescinded by Dr. Darrold Span.  I spoke with the Krystal [name] extensively on 05/13/2011 about the current plan of care and what a CODE STATUS is so that she can begin to think about these matters.  Plan is for 2 additional cycles of chemo beginning with single agent adriamycin today, then re-evaluation per Dr.  Darrold Span.  Neutropenia / thrombocytopenia  Related to prior chemotherapy.   Status post treatment with Neupogen.   Blood counts now recovered. Chronic constipation  Continue bowel regimen as tolerated. Inadequate oral intake  Wean TNA when by mouth intake improved.  Hypertension  Started on a clonidine patch 05/06/2011 with subsequent good blood pressure control.  Was on Norvasc as an outpatient.  Acute renal failure/mild hydronephrosis  Intermittently dehydrated.  IV fluids as needed. Normocytic anemia  Likely from anemia of chronic disease and the sequela of chemotherapy.  Hemoglobin stable.  Syncope/Fever  Had a syncopal event on 05/12/2011.  Started on Zosyn and IVF empirically for concerns of sepsis and dehydration.  Off antibiotics and IV fluids at this time. Hypokalemia  Resolved.   Code Status: Full Family Communication: Updated at bedside. Disposition Plan: Home, when stable.  Medical Consultants:  Dr. Jama Flavors, Oncology  Dr. Lodema Pilot, Surgery  Dr. Deanne Coffer, Interventional radiology  Dr. Laurette Schimke, OB-GYN oncolgy  Other consultants:  Pharmacy  Antibiotics:  Zosyn 05/12/2011--->05/18/11  Vancomycin 05/16/11--->05/18/11   Subjective  Krystal [name] had some nausea and vomiting after her G-tube was placed to gravity for approximately one half hour.  Her nausea and vomiting have resolved with the G-tube hooked back up to suction. Denies pain.   Objective    Interim History: Stable overnight.   Objective: Filed Vitals:   05/19/11 1433 05/19/11 2113 05/20/11 0538 05/20/11 0700  BP: 114/80 115/80 123/85   Pulse: 102 103 96   Temp: 98.8 F (37.1 C) 99.4 F (37.4 C) 99.8 F (37.7 C)   TempSrc: Oral Oral Oral   Resp: 16 18 15    Height:      Weight:    85.928 kg (189 lb 7  oz)  SpO2: 97% 97% 97%     Intake/Output Summary (Last 24 hours) at 05/20/11 1042 Last data filed at 05/20/11 0703  Gross per 24 hour  Intake   4463 ml   Output   1600 ml  Net   2863 ml    Exam: Gen:  NAD Cardiovascular:  RRR, No M/R/G Respiratory: Lungs CTAB Gastrointestinal: Abdomen very distended with sluggish bowel sounds. Extremities: No C/E/C    Data Reviewed: Basic Metabolic Panel:  Lab 05/20/11 1610 05/18/11 0355 05/17/11 0620 05/16/11 0520 05/15/11 0540 05/14/11 0600  NA 135 138 137 136 133* --  K 4.3 3.9 -- -- -- --  CL 99 105 104 102 98 --  CO2 24 22 23 24 26  --  GLUCOSE 128* 126* 140* 139* 109* --  BUN 30* 24* 25* 27* 29* --  CREATININE 1.14* 1.14* 1.07 1.13* 1.32* --  CALCIUM 8.9 9.0 8.8 8.5 8.4 --  MG -- 1.7 -- -- -- 2.2  PHOS -- 3.9 2.3 2.8 -- 3.1   GFR Estimated Creatinine Clearance: 70.2 ml/min (by C-G formula based on Cr of 1.14).  Liver Function Tests:  Lab 05/20/11 0830 05/18/11 0355 05/14/11 0600  AST 18 16 43*  ALT 15 21 63*  ALKPHOS 102 91 115  BILITOT 0.5 0.4 0.7  PROT 7.2 6.2 6.9  ALBUMIN 2.4* 2.1* 2.5*   CBC:  Lab 05/20/11 0830 05/17/11 0620 05/16/11 1150  WBC 11.2* 9.6 9.6  NEUTROABS 9.0* -- --  HGB 10.8* 9.4* 7.8*  HCT 34.0* 29.0* 25.5*  MCV 84.8 84.3 82.8  PLT 528* 376 457*   CBG:  Lab 05/20/11 0005 05/19/11 1805 05/19/11 1225 05/19/11 0602 05/18/11 2359  GLUCAP 130* 77 99 138* 118*   Lipid Profile  Basename 05/18/11 0353  CHOL 153  HDL --  LDLCALC --  TRIG 114  CHOLHDL --  LDLDIRECT --    Procedures and Diagnostic Studies over past 48 hours:   US Paracentesis 05/18/2011  IMPRESSION: Successful ultrasound guided therapeutic  paracentesis yielding 3 liters of ascites.  Read by: Jeananne Maizy Davanzo, P.A.-C  Original Report Authenticated By: Donavan Burnet, M.D.     Scheduled Meds:    . sodium chloride   Intravenous Once  . bisacodyl  10 mg Rectal TID  . cloNIDine  0.2 mg Transdermal Weekly  . dexamethasone  12 mg Intravenous Once  . DOXOrubicin  60 mg/m2 (Treatment Plan Actual) Intravenous Once  . enoxaparin  40 mg Subcutaneous Q24H  . fosaprepitant (EMEND) IV  infusion 150 mg  150 mg Intravenous Once  . insulin aspart  0-9 Units Subcutaneous Q6H  . palonosetron  0.25 mg Intravenous Once  . pantoprazole (PROTONIX) IV  40 mg Intravenous Q24H  . 0.9 % sodium chloride with kcl   Intravenous Q24H   Continuous Infusions:    . TPN (CLINIMIX) +/- additives 50 mL/hr at 05/19/11 1813   And  . fat emulsion 250 mL (05/19/11 1813)  . TPN (CLINIMIX) +/- additives     And  . fat emulsion        LOS: 21 days   Hillery Aldo, MD Pager [phone]  05/20/2011, 10:42 AM

## 2011-05-20 NOTE — Progress Notes (Signed)
PARENTERAL NUTRITION CONSULT NOTE - Follow-Up  Pharmacy Consult for TNA Indication: pSBO  No Known Allergies  Patient Measurements: Height: 5\' 6"  (167.6 cm) Weight: 205 lb (92.987 kg) IBW/kg (Calculated) : 59.3  Adjusted Body Weight: 69 kg  Vital Signs: Temp: 99.8 F (37.7 C) (05/08 0538) Temp src: Oral (05/08 0538) BP: 123/85 mmHg (05/08 0538) Pulse Rate: 96  (05/08 0538) Intake/Output from previous day: 05/07 0701 - 05/08 0700 In: 4463 [I.V.:657; TPN:3786] Out: 1300 [Drains:1300] Intake/Output from this shift: Total I/O In: -  Out: 300 [Drains:300]  Labs: No results found for this basename: WBC:3,HGB:3,HCT:3,PLT:3,APTT:3,INR:3 in the last 72 hours   Basename 05/18/11 0355 05/18/11 0353  NA 138 --  K 3.9 --  CL 105 --  CO2 22 --  GLUCOSE 126* --  BUN 24* --  CREATININE 1.14* --  LABCREA -- --  CREAT24HRUR -- --  CALCIUM 9.0 --  MG 1.7 --  PHOS 3.9 --  PROT 6.2 --  ALBUMIN 2.1* --  AST 16 --  ALT 21 --  ALKPHOS 91 --  BILITOT 0.4 --  BILIDIR -- --  IBILI -- --  PREALBUMIN 13.6* --  TRIG -- 114  CHOLHDL -- --  CHOL -- 153   Estimated Creatinine Clearance: 73.1 ml/min (by C-G formula based on Cr of 1.14).    Basename 05/20/11 0005 05/19/11 1805 05/19/11 1225  GLUCAP 130* 77 99    Medical History: Past Medical History  Diagnosis Date  . Hypertension     SINCE FIRST PREGNANCY  . Asthma   . Constipation   . History of miscarriage   . Preeclampsia   . Postoperative ileus 07/2010  . Iron deficiency anemia     s/p transfusions [facility]  . Uterine cancer     Medications:  Scheduled:     . sodium chloride   Intravenous Once  . bisacodyl  10 mg Rectal TID  . cloNIDine  0.2 mg Transdermal Weekly  . dexamethasone  12 mg Intravenous Once  . DOXOrubicin  60 mg/m2 (Treatment Plan Actual) Intravenous Once  . enoxaparin  40 mg Subcutaneous Q24H  . fosaprepitant (EMEND) IV infusion 150 mg  150 mg Intravenous Once  . insulin aspart  0-9 Units  Subcutaneous Q6H  . palonosetron  0.25 mg Intravenous Once  . pantoprazole (PROTONIX) IV  40 mg Intravenous Q24H  . 0.9 % sodium chloride with kcl   Intravenous Q24H   Infusions:     . TPN (CLINIMIX) +/- additives     And  . fat emulsion 250 mL (05/18/11 1746)  . TPN (CLINIMIX) +/- additives 50 mL/hr at 05/19/11 1813   And  . fat emulsion 250 mL (05/19/11 1813)    Insulin Requirements in the past 24 hours:  CBGs 122 142 125 118 138 99 while TNA infusing. Only 2 units SSI used last 24 hours.  Current Nutrition:  NPO  ClinimixE 5/15 cyclic delivered over 16hrs. Fat Emulsion 20% at 38ml/hr over 16hrs. NS w/ KCl/L @ 80ml/hr infused 10a-6p while TNA off  Nutritional Goals:  Per RD: Kcal goals: 1750-2050/day, Protein 70-90g/day, Fluid 1.7-2L/day. ClinimixE 5/15: at goal of 1975ml/day and fat emulsion 20% 270ml/day = 1843kcal/day and 96g protein/day  Assessment:  41 yo female with past medical history of endometrial cancer admitted on 4/17 with abdominal cramping and a partial SBO.  Being managed medically, laparotomy only if necessary/surgery.  TNA was started 4/25, increased to goal 4/28, started wean 4/29 but unable to tolerate PO so  increased back to goal 4/30. Was changed to electrolyte free formula d/t elevated Phos on 4/29; added electrolytes back to formula 5/5. Changed to cyclic/nocturnal 5/3.   CBG within goal <150  Chemistries: WNL 5/6  I/O incomplete documentation  Lipids wnl 5/6 (TG 114, 141).  Prealbumin - 11.8(4/29), 13.6 (5/6)  G tube to continuous to have large amount of output - 1.3L last 24h. Only tolerated clamping x 1 hr this morning.   Paracentesis 4/30 and 5/6  Getting chemotherapy today w/doxorubicin. Has Aloxi ordered as premed so will hold Zofran prn meds until D3 chemo per policy.  Plan:   Change cyclic TNA to over 14 hours in anticipation of d/c soon. Goal cylcic over 12 hours if tolerates.   Will adjust timing of SSI to  accommodate cyclic TNA  MVI and trace elements on MWF only due to ongoing shortage.  Continue NS w/ KCl/L @75ml /hr BUT hold while TNA is infusing.   F/U Thurs TNA labs in the morning   Gwen Her PharmD  [phone] 05/20/2011 8:10 AM

## 2011-05-20 NOTE — Progress Notes (Signed)
G Tube to drainage bag w/gravity at 0830. Small amt yellow fluid fluid from GT. Began having Nausea & Vomiting at 1000. Placed to [facility]  & med given. Nausea passed w/suction & med.Hartley Barefoot

## 2011-05-20 NOTE — Progress Notes (Signed)
Patient ID: Krystal [name], female   DOB: [birth year], 41 y.o.   MRN: [id number]  GYN onc consult noted Undergoing chemo Intra-abd carcinomatosis  Will be available if needed

## 2011-05-20 NOTE — Progress Notes (Signed)
Patient was only able to tolerate G tube being clamped for approximately an hour.

## 2011-05-20 NOTE — Progress Notes (Signed)
05/20/2011, 8:16 AM  Hospital day: 22 Antibiotics: none Chemotherapy: day 1 cycle 2 today, adriamycin only    Subjective: Awake and alert, 4 family members present. States very little stool with enema, but still small amounts with tid suppositories. I do not believe G tube was tried to gravity drain. Slept well, denies nausea or pain now.  Objective: Vital signs in last 24 hours: Blood pressure 123/85, pulse 96, temperature 99.8 F (37.7 C), temperature source Oral, resp. rate 15, height 5\' 6"  (1.676 m), weight 205 lb (92.987 kg), last menstrual period 07/02/2010, SpO2 97.00%.   Intake/Output from previous day: 05/07 0701 - 05/08 0700 In: 4463 [I.V.:657; TPN:3786] Out: 1300 [Drains:1300] Intake/Output this shift: Total I/O In: -  Out: 300 [Drains:300]  Physical exam: Looks comfortable supine on RA, with G tube to suction, cannister 3/4 full of dark liquid. PAC site fine. NSL out. Cor RRR. Lungs clear ant. Abdomen full but not tight, quiet, not tender to gentle exam. G tube dressings dry. LE without edema, cords.  Lab Results: No results found for this basename: WBC:2,HGB:2,HCT:2,PLT:2 in the last 72 hours BMET  [facility] 05/18/11 0355  NA 138  K 3.9  CL 105  CO2 22  GLUCOSE 126*  BUN 24*  CREATININE 1.14*  CALCIUM 9.0    Studies/Results: Ct Abdomen Pelvis W Contrast  05/18/2011  *RADIOLOGY REPORT*  Clinical Data: Abdominal pain, distention, nausea and vomiting. History of recurrent endometrial cancer.  Ongoing chemotherapy.  CT ABDOMEN AND PELVIS WITH CONTRAST  Technique:  Multidetector CT imaging of the abdomen and pelvis was performed following the standard protocol during bolus administration of intravenous contrast.  Contrast: OMNIPAQUE IOHEXOL 300 MG/ML  SOLN  Comparison: 04/29/2011  Findings: Small left pleural effusion is increased.  Plate-like bilateral lower lobe atelectasis again noted.  This is also increased.  Presumed Port-A-Cath tip terminates at the  cavoatrial junction.  Stable hypodense right hepatic lobe lesions, for example image 18. These are too small for further characterization.  Overall increased now moderate ascites.  Left upper quadrant possible peritoneal nodularity and omental infiltration could suggest peritoneal metastasis.  None is individually discretely measurable.  Vicarious excretion of contrast or less likely sludge within the otherwise normal appearing gallbladder.  Differential hypo enhancement of the left kidney as compared to the right with mild left hydronephrosis and hydroureter to the level of the pelvic inlet without visualized lesion.  Loculated fluid or necrotic metastatic lesion on image 66 adjacent to the left ureter could represent focal transition point and is unchanged.  Interval placement of percutaneous gastrostomy tube. 1.6 cm gastrohepatic lymph node image 27 is unchanged or slightly smaller allowing for differences in technique.  Splenic surfaces minimally scalloped with adjacent ascites but no intra splenic lesion is identified.  Pancreas and adrenal glands are normal.  The right kidney is normal.  Rectus diastasis again noted.  Mid and distal small bowel dilatation is increased with tapering at the level of the ileum. Apparent transition point again noted subjacent to the anterior abdominal wall, image 71, possibly reflecting effusion.  Contrast does pass distally into the dilated colon.  Evidence of hysterectomy and oophorectomy.  Prior retroperitoneal nodal dissection.  Previously measured periaortic node measuring 7 mm image 50 is stable.  No new osseous abnormality.  IMPRESSION:  Increase in mid/distal small bowel dilatation to the point of probable adhesion subjacent to the anterior midline abdominal wall. Distal passage of contrast suggests an element of partial obstruction.  Increased ascites and presumed peritoneal  metastatic burden.  Increased left pleural effusion.  Left renal hypo enhancement and lack of  normal excretion, with probable retroperitoneal obstructive soft tissue implant versus metastatic lymph node.  Original Report Authenticated By: Harrel Lemon, M.D.   US Paracentesis  05/18/2011  *RADIOLOGY REPORT*  Clinical Data: Metastatic endometrial carcinoma, recurrent ascites; request is made for therapeutic paracentesis.  ULTRASOUND GUIDED THERAPEUTIC  PARACENTESIS  An ultrasound guided paracentesis was thoroughly discussed with the patient/patient's family  and questions answered.  The benefits, risks, alternatives and complications were also discussed.  The patient/patient's family understands and wishes to proceed with the procedure.  Written consent was obtained.  Ultrasound was performed to localize and mark an adequate pocket of fluid in the right upper quadrant of the abdomen.  The area was then prepped and draped in the normal sterile fashion.  1% Lidocaine was used for local anesthesia.  Under ultrasound guidance a 19 gauge Yueh catheter was introduced.  Paracentesis was performed.  The catheter was removed and a dressing applied.  Complications:  none  Findings:  A total of approximately 3 liters of yellow fluid was removed.  IMPRESSION: Successful ultrasound guided therapeutic  paracentesis yielding 3 liters of ascites.  Read by: Jeananne Rama, P.A.-C  Original Report Authenticated By: Donavan Burnet, M.D.     Assessment/Plan: 1. Recurrent, aggressive endometrial carcinoma: complicated by bowel obstruction(s), not surgical candidate per gyn oncology, wants to continue treatment. Plan is for 2 additional cycles of chemo beginning with single agent adriamycin today, then re-evaluation. TNA apparently can be continued at DC for short term, without which this attempt at chemotherapy would not be possible. Case manager in communication with home care agency re how to manage G tube at home (? Just gravity drain or some type suction). Even if suction available at home, she will need to have G  tube to gravity drain at times, including coming for physician visits. I am hopeful that she can return home in next 24 - 48 hrs when arrangements in place. Discussed with RN now; see orders. 2.PAC in 3.difficult social situation: assistance from case manager and home care agency much appreciated.   Veria Stradley P

## 2011-05-21 ENCOUNTER — Other Ambulatory Visit: Payer: Self-pay | Admitting: Oncology

## 2011-05-21 ENCOUNTER — Inpatient Hospital Stay (HOSPITAL_COMMUNITY)
Admission: RE | Admit: 2011-05-21 | Discharge: 2011-05-21 | Payer: Self-pay | Source: Ambulatory Visit | Attending: Gynecology | Admitting: Gynecology

## 2011-05-21 ENCOUNTER — Telehealth: Payer: Self-pay | Admitting: Oncology

## 2011-05-21 ENCOUNTER — Other Ambulatory Visit: Payer: Self-pay | Admitting: Certified Registered Nurse Anesthetist

## 2011-05-21 DIAGNOSIS — D696 Thrombocytopenia, unspecified: Secondary | ICD-10-CM | POA: Diagnosis present

## 2011-05-21 DIAGNOSIS — R638 Other symptoms and signs concerning food and fluid intake: Secondary | ICD-10-CM | POA: Diagnosis present

## 2011-05-21 DIAGNOSIS — C541 Malignant neoplasm of endometrium: Secondary | ICD-10-CM

## 2011-05-21 DIAGNOSIS — E876 Hypokalemia: Secondary | ICD-10-CM | POA: Diagnosis present

## 2011-05-21 DIAGNOSIS — R55 Syncope and collapse: Secondary | ICD-10-CM | POA: Diagnosis present

## 2011-05-21 DIAGNOSIS — D638 Anemia in other chronic diseases classified elsewhere: Secondary | ICD-10-CM | POA: Diagnosis present

## 2011-05-21 DIAGNOSIS — N1330 Unspecified hydronephrosis: Secondary | ICD-10-CM | POA: Diagnosis present

## 2011-05-21 DIAGNOSIS — N179 Acute kidney failure, unspecified: Secondary | ICD-10-CM | POA: Diagnosis present

## 2011-05-21 DIAGNOSIS — K56609 Unspecified intestinal obstruction, unspecified as to partial versus complete obstruction: Secondary | ICD-10-CM

## 2011-05-21 DIAGNOSIS — I10 Essential (primary) hypertension: Secondary | ICD-10-CM | POA: Diagnosis present

## 2011-05-21 DIAGNOSIS — C549 Malignant neoplasm of corpus uteri, unspecified: Secondary | ICD-10-CM

## 2011-05-21 DIAGNOSIS — R112 Nausea with vomiting, unspecified: Secondary | ICD-10-CM

## 2011-05-21 DIAGNOSIS — E43 Unspecified severe protein-calorie malnutrition: Secondary | ICD-10-CM

## 2011-05-21 LAB — GLUCOSE, CAPILLARY: Glucose-Capillary: 119 mg/dL — ABNORMAL HIGH (ref 70–99)

## 2011-05-21 LAB — COMPREHENSIVE METABOLIC PANEL
ALT: 17 U/L (ref 0–35)
Alkaline Phosphatase: 95 U/L (ref 39–117)
CO2: 24 mEq/L (ref 19–32)
Chloride: 101 mEq/L (ref 96–112)
GFR calc Af Amer: 81 mL/min — ABNORMAL LOW (ref >90)
Glucose, Bld: 145 mg/dL — ABNORMAL HIGH (ref 70–99)
Potassium: 4.1 mEq/L (ref 3.5–5.1)
Sodium: 135 mEq/L (ref 135–145)
Total Bilirubin: 0.3 mg/dL (ref 0.3–1.2)
Total Protein: 6.7 g/dL (ref 6.0–8.3)

## 2011-05-21 MED ORDER — ONDANSETRON 8 MG PO TBDP: 8.0000 mg | ORAL_TABLET | Freq: Three times a day (TID) | ORAL | Status: DC | PRN

## 2011-05-21 MED ORDER — POTASSIUM CHLORIDE IN NACL 20-0.9 MEQ/L-% IV SOLN: INTRAVENOUS | Status: DC

## 2011-05-21 MED ORDER — CLONIDINE HCL 0.2 MG/24HR TD PTWK: 1.0000 | MEDICATED_PATCH | TRANSDERMAL | Status: DC

## 2011-05-21 MED ORDER — FAT EMULSION 20 % IV EMUL: INTRAVENOUS | Status: DC

## 2011-05-21 MED ORDER — FILGRASTIM 300 MCG/ML IJ SOLN
300.0000 ug | Freq: Every day | INTRAMUSCULAR | Status: DC
  Administered 2011-05-21: 300 ug via SUBCUTANEOUS
  Filled 2011-05-21: qty 1

## 2011-05-21 MED ORDER — POTASSIUM CHLORIDE IN NACL 20-0.9 MEQ/L-% IV SOLN
INTRAVENOUS | Status: AC
  Administered 2011-05-21: 09:00:00 via INTRAVENOUS
  Filled 2011-05-21: qty 1000

## 2011-05-21 MED ORDER — TRACE MINERALS CR-CU-MN-SE-ZN 10-1000-500-60 MCG/ML IV SOLN: INTRAVENOUS | Status: DC

## 2011-05-21 MED ORDER — POTASSIUM CHLORIDE IN NACL 20-0.9 MEQ/L-% IV SOLN
INTRAVENOUS | Status: DC
  Filled 2011-05-21: qty 1000

## 2011-05-21 MED ORDER — FAT EMULSION 20 % IV EMUL: 250.0000 mL | INTRAVENOUS | Status: DC

## 2011-05-21 MED ORDER — DEXAMETHASONE SODIUM PHOSPHATE 4 MG/ML IJ SOLN
12.0000 mg | Freq: Once | INTRAMUSCULAR | Status: AC
  Administered 2011-05-21: 12 mg via INTRAVENOUS
  Filled 2011-05-21 (×2): qty 3

## 2011-05-21 MED ORDER — TRACE MINERALS CR-CU-MN-SE-ZN 10-1000-500-60 MCG/ML IV SOLN: 50.0000 mL/h | INTRAVENOUS | Status: DC

## 2011-05-21 MED ORDER — PROMETHAZINE HCL 25 MG RE SUPP: 25.0000 mg | RECTAL | Status: DC | PRN

## 2011-05-21 NOTE — Progress Notes (Signed)
  05/21/2011, 9:06 AM  Hospital day: 40 Antibiotics: none Chemotherapy: day 2 cycle 2, adriamycin only  Subjective: Up in chair, one family member here. Tolerated chemotherapy yesterday. Nauseated now "chemo nausea"; G tube is to suction. Vomited large amount when G tube to gravity drain yesterday X ~ 1 hr. Will give additional IV decadron now, as well as using prn ativan and prn phenergan today (no benefit to zofran today or tomorrow due to aloxi used with chemo). No cough or shortness of breath, no abdominal pain and does not seem tighter to patient. Per nursing yesterday, apparently suction will be available at home for G tube, tho I do not see any documentation in notes from case manager.  Objective: Vital signs in last 24 hours: Blood pressure 117/82, pulse 93, temperature 97.9 F (36.6 C), temperature source Oral, resp. rate 18, height 5\' 6"  (1.676 m), weight 189 lb 7 oz (85.928 kg), last menstrual period 07/02/2010, SpO2 97.00%.   Intake/Output from previous day: 05/08 0701 - 05/09 0700 In: 796.4 [I.V.:397.6; TPN:398.7] Out: 600 [Drains:600] Intake/Output this shift:    Physical exam: alert, sitting up without difficulty, respirations not labored RA, obviously nauseated. PERRL, not icteric. Mouth moist. Lungs clear ant/ post. Cor RRR. PAC dressed, infusing without difficulty. Abdomen still not tight, quiet, not tender to gentle exam. Large ventral hernia easily reducible.  LE no edema. Moves easily in recliner.  Lab Results:  Basename 05/20/11 0830  WBC 11.2*  HGB 10.8*  HCT 34.0*  PLT 528*   BMET  Basename 05/21/11 0530 05/20/11 0830  NA 135 135  K 4.1 4.3  CL 101 99  CO2 24 24  GLUCOSE 145* 128*  BUN 33* 30*  CREATININE 0.98 1.14*  CALCIUM 8.7 8.9  REmainder of CMET noted. Magnesium not low with present TNA (post CDDP 2 weeks ago)  Studies/Results: No results found.  Discussed with nursing, who will use the prn phenergan q 4 hrs today as long as not too  sedated, in addition to one dose decadron 12 mg IV now. Assessment/Plan: 1.recurrent endometrial carcinoma with bowel obstruction which has not improved with conservative care/ not surgical candidate/ now trying planned 2 additional cycles of chemotherapy and supporting with short term TNA. I would agree with DC home when arrangements are finalized, including TNA management and G tube care. I will see her back in office tomorrow if needed or at least Monday 05-25-11. Will begin neupogen today and daily while in hospital due to prior chemo and pelvic radiation, then I will continue this at my visit. 2. PAC and G tube in, both by interventional radiology 3.inability to take oral fluids or nutrition. She will need SL lorazepam and  phenergan suppositories at DC. 4.difficult social situation: assistance from case manager and indigent programs much appreciated.  Willard Madrigal P

## 2011-05-21 NOTE — Telephone Encounter (Signed)
Talked to Onalee Hua, RN, gave him appt date for pt lab and MD visit on 05/25/10

## 2011-05-21 NOTE — Progress Notes (Signed)
Pt will discharge home with [facility], [facility] and DME. mp

## 2011-05-21 NOTE — Plan of Care (Signed)
Problem: Phase II Progression Outcomes Goal: IV changed to normal saline lock Outcome: Not Applicable Date Met:  05/21/11 Patient on Cyclical TNA and IVF when TNA not hanging  Problem: Phase III Progression Outcomes Goal: IV/normal saline lock discontinued Outcome: Not Applicable Date Met:  05/21/11 PAC will remain accessed for discharge home for home TNA     Problem: Discharge Progression Outcomes Goal: Tolerating diet Outcome: Not Applicable Date Met:  05/21/11 Patient on TNA, only tolerates very little po

## 2011-05-21 NOTE — Discharge Summary (Signed)
Physician Discharge Summary  Patient ID: Krystal [name] MRN: [id number] DOB/AGE: Dec 30, 1970 41 y.o.  Admit date: 04/29/2011 Discharge date: 05/21/2011  Primary Care Physician:  [name], MD, MD   Discharge Diagnoses:   .Endometrial ca .Partial small bowel obstruction .Neutropenia .Chronic constipation .Ascites .HTN (hypertension) .Thrombocytopenia .Inadequate oral intake .ARF (acute renal failure) .Hydronephrosis .Normocytic anemia .Syncope .Hypokalemia  Discharge Medications:  Medication List  As of 05/21/2011 10:17 AM   STOP taking these medications         amLODipine 10 MG tablet      Ferrous Fumarate 324 MG Tabs      ibuprofen 800 MG tablet      magnesium oxide 400 MG tablet      ondansetron 8 MG tablet         TAKE these medications         cloNIDine 0.2 mg/24hr patch   Commonly known as: CATAPRES - Dosed in mg/24 hr   Place 1 patch (0.2 mg total) onto the skin once a week.      fat emulsion 20 % infusion   Inject 250 mLs into the vein continuous.      LORazepam 1 MG tablet   Commonly known as: ATIVAN   TAKE 1 TABLET UNDER THE TONGUE OR SWALLOW EVERY 4 HOURS AS NEEDED  FOR NAUSEA.  WILL MAKE YOU DROWSY.      ondansetron 8 MG disintegrating tablet   Commonly known as: ZOFRAN-ODT   Take 1 tablet (8 mg total) by mouth every 8 (eight) hours as needed for nausea.      promethazine 25 MG suppository   Commonly known as: PHENERGAN   Place 1 suppository (25 mg total) rectally every 4 (four) hours as needed for nausea.      tpn solution (CLINIMIX E 5/15) 5 % SOLN 2,000 mL with multivitamins adult INJ 10 mL, trace elements Cr-Cu-Mn-Se-Zn 10-998-500-60 MCG/ML SOLN 1 mL   Inject 50 mL/hr into the vein continuous.             Disposition and Follow-up: The patient is being discharged home with home health RN to provide nursing care including cyclic TNA support, and G-Tube suction.   Medical Consults: Dr. Jama Flavors, Oncology  Dr. Lodema Pilot, Surgery  Dr. Deanne Coffer, Interventional radiology  Dr. Laurette Schimke, OB-GYN oncolgy   Procedures and Diagnostic Studies:   Dg Abd 1 View 05/13/2011 IMPRESSION: Persistent small bowel obstruction.  Some barium does remain in the fundus of the stomach in the supine position.  Original Report Authenticated By: Juline Patch, M.D.    Dg Abd 1 View 05/12/2011 IMPRESSION: NG tube tip in the antrum of the stomach.  There is little change in gaseous distention of small bowel loops.  Original Report Authenticated By: Juline Patch, M.D.    Dg Abd 1 View 05/11/2011 IMPRESSION: 1. No significant change in small bowel obstruction pattern compared to the abdominal radiographs of 05/06/2011. 2.  Evidence of ascites.  Original Report Authenticated By: Britta Mccreedy, M.D.    Dg Abd 1 View 05/06/2011  IMPRESSION: Worsening dilatation of the small intestine consistent with worsening partial small bowel obstruction.  Original Report Authenticated By: Thomasenia Sales, M.D.    Dg Abd 1 View 05/03/2011 IMPRESSION:  1.  Persistent retained contrast.  Last contrast exam 04/29/2011. 2.  Persistent dilatation of small bowel loops, consistent with at least partial small bowel obstruction.  Original Report Authenticated By: Patterson Hammersmith, M.D.    Dg  Abd 1 View 05/01/2011 IMPRESSION: Recurrent small bowel dilatation as demonstrated on recent CT and concerning for partial small-bowel obstruction, possibly on the basis of adhesions or peritoneal carcinomatosis.  Original Report Authenticated By: Gerrianne Scale, M.D.    Abd 1 View (kub) 04/30/2011 IMPRESSION: Normal stool and bowel gas pattern.  Original Report Authenticated By: Brandon Melnick, M.D.    Ct Abdomen Pelvis W Contrast 05/18/2011  IMPRESSION:  Increase in mid/distal small bowel dilatation to the point of probable adhesion subjacent to the anterior midline abdominal wall. Distal passage of contrast suggests an element of partial obstruction.  Increased  ascites and presumed peritoneal metastatic burden.  Increased left pleural effusion.  Left renal hypo enhancement and lack of normal excretion, with probable retroperitoneal obstructive soft tissue implant versus metastatic lymph node.  Original Report Authenticated By: Harrel Lemon, M.D.       04/29/2011  CT ABDOMEN AND PELVIS WITH CONTRAST  IMPRESSION: Dilated loops of small bowel in the left upper abdomen, compatible with at least early/partial small bowel obstruction, possibly on the basis of adhesions.  Additional suspected focal transition in the left mid abdomen.  Interval development of moderate abdominopelvic ascites with peritoneal nodularity, suggesting peritoneal disease.  Gastrohepatic and retroperitoneal nodal metastases, as described above.  Diminished enhancement of the left kidney relative to the right with mild left hydronephrosis, possibly on the basis of extrinsic compression on the mid/lower ureter by peritoneal disease.  Midline lower pelvic ventral hernia containing a loop of small bowel. Original Report Authenticated By: Charline Bills, M.D.    Ir Gastrostomy Tube Mod Sed 05/15/2011  Impression:Successful fluoroscopic guided percutaneous gastrostomy tube placement.  Original Report Authenticated By: Richarda Overlie, M.D.    US Paracentesis 05/18/2011 IMPRESSION: Successful ultrasound guided therapeutic  paracentesis yielding 3 liters of ascites.  Read by: Jeananne Shaquia Berkley, P.A.-C  Original Report Authenticated By: Donavan Burnet, M.D.    Ir Basil Dess Tube Plc W/fl W/rad 05/12/2011  Findings:Large amount of ascites.  2 liters of fluid was removed. Barium within the stomach and dilated small bowel loops. Gas-filled structures throughout the abdomen may represent colon and small bowel loops.  Complications: None  Impression:Successful ultrasound guided paracentesis.  2 liters of fluid was removed.  The patient is scheduled for a percutaneous gastrostomy tube. However, there is barium within  the stomach which is not ideal for placement of a gastrostomy tube.  As a result, a nasogastric tube was placed in order to remove the barium and decompress the dilated bowel loops.  The patient will be evaluated again on May 13, 2011 for the gastrostomy tube placement.  Original Report Authenticated By: Richarda Overlie, M.D.    Dg Chest Port 1 View 05/03/2011  IMPRESSION: Extremely low lung volumes with bilateral lower lobe atelectasis and possible retrocardiac opacity.  PA and lateral chest radiograph at full inspiration are recommended when the patient is clinically able, for better evaluation.  Original Report Authenticated By: Harrel Lemon, M.D.    Dg Abd Acute W/chest 05/04/2011  IMPRESSION: Bowel gas pattern suggests residual partial small bowel obstruction.  Original Report Authenticated By: Reyes Ivan, M.D.    Dg Abd Portable 1v 05/14/2011 IMPRESSION: Barium is in the colon.  G tube can be performed.  Original Report Authenticated By: Donavan Burnet, M.D.    Dg Naso G Tube Plc W/fl W/rad 05/13/2011 IMPRESSION: NG tube tip is in the fundus of the stomach.  Original Report Authenticated By: Donavan Burnet, M.D.  Ir Paracentesis 05/12/2011 Impression:Successful ultrasound guided paracentesis.  2 liters of fluid was removed.  The patient is scheduled for a percutaneous gastrostomy tube. However, there is barium within the stomach which is not ideal for placement of a gastrostomy tube.  As a result, a nasogastric tube was placed in order to remove the barium and decompress the dilated bowel loops.  The patient will be evaluated again on May 13, 2011 for the gastrostomy tube placement.  Original Report Authenticated By: Richarda Overlie, M.D.    Discharge Laboratory Values: Basic Metabolic Panel:  Lab 05/21/11 1610 05/20/11 0830 05/18/11 0355 05/17/11 0620 05/16/11 0520  NA 135 135 138 137 136  K 4.1 4.3 -- -- --  CL 101 99 105 104 102  CO2 24 24 22 23 24   GLUCOSE 145* 128* 126* 140* 139*  BUN  33* 30* 24* 25* 27*  CREATININE 0.98 1.14* 1.14* 1.07 1.13*  CALCIUM 8.7 8.9 9.0 8.8 8.5  MG 2.1 -- 1.7 -- --  PHOS 3.7 -- 3.9 2.3 2.8   GFR Estimated Creatinine Clearance: 81.7 ml/min (by C-G formula based on Cr of 0.98). Liver Function Tests:  Lab 05/21/11 0530 05/20/11 0830 05/18/11 0355  AST 20 18 16   ALT 17 15 21   ALKPHOS 95 102 91  BILITOT 0.3 0.5 0.4  PROT 6.7 7.2 6.2  ALBUMIN 2.2* 2.4* 2.1*   CBC:  Lab 05/20/11 0830 05/17/11 0620 05/16/11 1150  WBC 11.2* 9.6 9.6  NEUTROABS 9.0* -- --  HGB 10.8* 9.4* 7.8*  HCT 34.0* 29.0* 25.5*  MCV 84.8 84.3 82.8  PLT 528* 376 457*   CBG:  Lab 05/21/11 1002 05/21/11 0749 05/21/11 0407 05/21/11 0107 05/20/11 0005  GLUCAP 105* 119* 145* 164* 130*    Brief H and P: For complete details please refer to admission H and P, but in brief, [name] is a 41 year old female admitted 04/29/11 for partial small bowel obstruction secondary to endometrial carcinoma.   Physical Exam at Discharge: BP 113/84  Pulse 96  Temp(Src) 98.2 F (36.8 C) (Oral)  Resp 22  Ht 5\' 6"  (1.676 m)  Wt 85.928 kg (189 lb 7 oz)  BMI 30.58 kg/m2  SpO2 98%  LMP 07/02/2010 Gen:  NAD, sleepy Cardiovascular:  RRR, No M/R/G Respiratory: Lungs CTAB Gastrointestinal: Abdomen softly distended, occasional bowel sounds. Extremities: No C/E/C  Hospital Course:  Principal Problem:  *Partial small bowel obstruction related to endometrial cancer and tumor burden in the abdomen Surgical consultation performed by Dr. Lodema Pilot on 05/03/11: Surgical intervention felt to be only used as a last resort.  Treated conservatively with bowel rest and NG tube decompression of the stomach.  A G-tube was placed by interventional radiology for symptom management on 05/14/2011.  TNA started for temporary nutritional purposes. Unable to wean yet. Active Problems:  Endometrial ca / Ascites  Status post cycle 1 cisplatin/Adriamycin and cycle 2 adriamycin monotherapy on 05/20/11.    Rapidly progressive per Dr. Precious Reel notes.  Paracentesis done on 05/12/2011 and 05/18/2011.  Dr. Darrold Span has requested GYN-ONC consultation with recommendations to proceed with chemotherapy.  I ordered a palliative care consult 05/13/2011 to help assist with goals of care and CODE STATUS given poor prognosis, and to address the patient's need to understand all of her options. This order was rescinded by Dr. Darrold Span.  I spoke with the patient extensively on 05/13/2011 about the current plan of care and what a CODE STATUS is so that she can begin to think about  these matters. Plan is for 2 additional cycles of chemo beginning with single agent adriamycin (given 05/20/11), then re-evaluation per Dr. Darrold Span. Neutropenia / thrombocytopenia  Related to prior chemotherapy.  Status post treatment with Neupogen.  Blood counts now recovered. Given a dose of Neupogen on 05/21/11 in anticipation of chemo related neutropenia. Chronic constipation  Was on a bowel regimen as tolerated. Suppositories have now been discontinued. Inadequate oral intake  Discharge home on cyclic TNA. Hypertension  Started on a clonidine patch 05/06/2011 with subsequent good blood pressure control.  Was on Norvasc as an outpatient. Will D/C and send home with prescription for clonidine patches. Acute renal failure/mild hydronephrosis  Intermittently dehydrated.  IV fluids as needed. Normocytic anemia  Likely from anemia of chronic disease and the sequela of chemotherapy.  Hemoglobin stable. Syncope/Fever  Had a syncopal event on 05/12/2011. 1.   Started on Zosyn and IVF empirically for concerns of sepsis and dehydration.  Off antibiotics and IV fluids at this time. Hypokalemia  Resolved.   Recommendations for hospital follow-up: 1.  Ongoing fluid and electrolyte monitoring while on TNA.  Diet:  Sips of clear liquids as tolerated.  Activity:  Increase activity as tolerated.  Condition at Discharge:   Guarded, high risk  for re-admission given terminal prognosis.  Time spent on Discharge:  40 minutes.  Signed: Dr. Trula Ore Tabb Croghan Pager [phone] 05/21/2011, 10:17 AM

## 2011-05-21 NOTE — Discharge Instructions (Signed)
Intestinal Obstruction An intestinal obstruction comes from a physical blockage in the bowel. Different problems in the bowel may cause these blockages.  CAUSES   Adhesions from previous surgeries   Cancer or tumor   A hernia, which is a condition in which a portion of the bowel bulges out through an opening or weakness in the abdomen (belly). This sometimes squeezes the bowel.   A swallowed object.   Blockage (impaction) with worms is common in third world countries.   A twisting of the bowel or telescoping of a portion of the bowel into another portion (intussusceptions)   Anything that stops food from going through from the stomach to the anus.  SYMPTOMS  Symptoms of bowel obstruction may result in your belly getting bigger (bloating), nausea, vomiting, explosive diarrhea, explosive stool. You may not be able to hear your normal bowel sounds (such as "growling in your stomach"). You may also stop having bowel movements or passing gas. DIAGNOSIS  Usually this condition is diagnosed with a history and an examination. Often, lab studies (blood work) and x-rays may be used to find the cause. TREATMENT  The main treatment of this condition is to rest the intestine (gut). Often times the obstruction may relieve itself and allow the gut to start working again. Think of the gut like a balloon that is blown up (filled with trapped food and water) which has squeezed into a hole or area which it cannot get through.   If the obstruction is complete, an NG tube (nasogastric) is passed through the nose and into the stomach. It is then connected to suction to keep the stomach emptied out. This also helps treat the nausea and vomiting.   If there is an imbalance in the electrolytes, they are corrected with intravenous fluids. These have the proper chemicals in them to correct the problem.   If the reason for the obstruction (blockage) does not get better with conservative (non-surgical) treatment,  surgery may be necessary. Sometimes, surgery is done immediately if your surgeon knows that the problem is not going to get better with conservative treatment.  PROGNOSIS  Depending on what the problem is, most of these problems can be treated by your caregivers with good results. Your surgeon will discuss the best course of action to take, with you or your loved ones. FOLLOWING SURGERY Seek immediate medical attention if you have:  Increasing abdominal pain, repeated vomiting, dehydration or fainting.   Severe weakness, chest pain or back pain.   Blood in your vomit, stool or you have tarry stool.  Document Released: 03/21/2003 Document Revised: 12/18/2010 Document Reviewed: 08/19/2007 [employer] Patient Information 2012 [employer]. 

## 2011-05-21 NOTE — Progress Notes (Signed)
Discharge instructions reviewed with patient and several family members (patient's mother Bonney Aid, patient's cousin Michel Santee, patient's cousin Audria Nine, and patient's brother Dime).  Patient was the only one that speaks good [place].  Everyone else speaks [place] with very little [place].  I offered several times to call for a [place] Interpreter to assist and the patient was adamant that I not contact an interpreter and that she would translate what I was saying to her family members.  I reviewed the discharge paperwork including follow up with Dr. Darrold Span, medication regimen, when she would need to call the MD (fever >100.4, uncontrolled nausea/vomiting, uncontrolled pain), general information handout on bowel obstruction and general information handout on gastrostomy tube.  I then went over gastrostomy tube care (flushing, dressing changes, monitoring for complications, hooking it to a bag for gravity draining, etc.)  I then reviewed the [employer] suction machine (patient's machine for home was brought to the hospital to allow for education).  I also reviewed Port-a-cath care and monitoring for complications.  Patient's Port-a-cath remains accessed for discharge and she has already been hooked up to home TPN by home health pharmacist and she provided the education on the TPN.  Patient verbalized understanding of all instructions and her mother was able to demonstrate gastrostomy tube care appropriately.  Patient given a few gastrostomy tube supplies until home health RN sees her tomorrow.  Patient taken to car via wheelchair and she was transported home by her brother.  Patient currently appears stable for discharge home.  Assessment unchanged from this am.  [name] [facility] 05/21/2011

## 2011-05-22 ENCOUNTER — Emergency Department (HOSPITAL_COMMUNITY)
Admission: EM | Admit: 2011-05-22 | Discharge: 2011-05-22 | Disposition: A | Discharge status: Home / Self Care | Payer: Self-pay | Attending: Emergency Medicine | Admitting: Emergency Medicine

## 2011-05-22 ENCOUNTER — Emergency Department (HOSPITAL_COMMUNITY): Payer: Self-pay

## 2011-05-22 ENCOUNTER — Encounter (HOSPITAL_COMMUNITY): Payer: Self-pay | Admitting: Emergency Medicine

## 2011-05-22 ENCOUNTER — Telehealth: Payer: Self-pay

## 2011-05-22 DIAGNOSIS — J45909 Unspecified asthma, uncomplicated: Secondary | ICD-10-CM | POA: Insufficient documentation

## 2011-05-22 DIAGNOSIS — R0789 Other chest pain: Secondary | ICD-10-CM | POA: Insufficient documentation

## 2011-05-22 DIAGNOSIS — K56609 Unspecified intestinal obstruction, unspecified as to partial versus complete obstruction: Secondary | ICD-10-CM | POA: Insufficient documentation

## 2011-05-22 DIAGNOSIS — K56600 Partial intestinal obstruction, unspecified as to cause: Secondary | ICD-10-CM

## 2011-05-22 DIAGNOSIS — C549 Malignant neoplasm of corpus uteri, unspecified: Secondary | ICD-10-CM | POA: Insufficient documentation

## 2011-05-22 DIAGNOSIS — C801 Malignant (primary) neoplasm, unspecified: Secondary | ICD-10-CM | POA: Insufficient documentation

## 2011-05-22 DIAGNOSIS — I10 Essential (primary) hypertension: Secondary | ICD-10-CM | POA: Insufficient documentation

## 2011-05-22 DIAGNOSIS — R112 Nausea with vomiting, unspecified: Secondary | ICD-10-CM | POA: Insufficient documentation

## 2011-05-22 DIAGNOSIS — C799 Secondary malignant neoplasm of unspecified site: Secondary | ICD-10-CM

## 2011-05-22 HISTORY — DX: Unspecified intestinal obstruction, unspecified as to partial versus complete obstruction: K56.609

## 2011-05-22 LAB — URINALYSIS, ROUTINE W REFLEX MICROSCOPIC
Bilirubin Urine: NEGATIVE
Glucose, UA: NEGATIVE mg/dL
Hgb urine dipstick: NEGATIVE
Ketones, ur: NEGATIVE mg/dL
Protein, ur: 30 mg/dL — AB
Urobilinogen, UA: 0.2 mg/dL (ref 0.0–1.0)

## 2011-05-22 LAB — COMPREHENSIVE METABOLIC PANEL
ALT: 36 U/L — ABNORMAL HIGH (ref 0–35)
Alkaline Phosphatase: 128 U/L — ABNORMAL HIGH (ref 39–117)
BUN: 34 mg/dL — ABNORMAL HIGH (ref 6–23)
CO2: 24 mEq/L (ref 19–32)
Chloride: 102 mEq/L (ref 96–112)
GFR calc Af Amer: 83 mL/min — ABNORMAL LOW (ref >90)
GFR calc non Af Amer: 71 mL/min — ABNORMAL LOW (ref >90)
Glucose, Bld: 88 mg/dL (ref 70–99)
Potassium: 4.2 mEq/L (ref 3.5–5.1)
Sodium: 138 mEq/L (ref 135–145)
Total Bilirubin: 0.5 mg/dL (ref 0.3–1.2)
Total Protein: 7.2 g/dL (ref 6.0–8.3)

## 2011-05-22 LAB — URINE MICROSCOPIC-ADD ON

## 2011-05-22 LAB — DIFFERENTIAL
Eosinophils Relative: 0 % (ref 0–5)
Lymphs Abs: 1 10*3/uL (ref 0.7–4.0)
Monocytes Absolute: 0.6 10*3/uL (ref 0.1–1.0)
Neutro Abs: 30.8 10*3/uL — ABNORMAL HIGH (ref 1.7–7.7)

## 2011-05-22 LAB — CBC
HCT: 31.1 % — ABNORMAL LOW (ref 36.0–46.0)
MCH: 27.3 pg (ref 26.0–34.0)
MCV: 84.1 fL (ref 78.0–100.0)
Platelets: 467 10*3/uL — ABNORMAL HIGH (ref 150–400)
RBC: 3.7 MIL/uL — ABNORMAL LOW (ref 3.87–5.11)

## 2011-05-22 LAB — LACTIC ACID, PLASMA: Lactic Acid, Venous: 1.2 mmol/L (ref 0.5–2.2)

## 2011-05-22 MED ORDER — IOHEXOL 350 MG/ML SOLN
80.0000 mL | Freq: Once | INTRAVENOUS | Status: AC | PRN
  Administered 2011-05-22: 80 mL via INTRAVENOUS

## 2011-05-22 MED ORDER — MORPHINE SULFATE 4 MG/ML IJ SOLN
4.0000 mg | Freq: Once | INTRAMUSCULAR | Status: AC
  Administered 2011-05-22: 4 mg via INTRAVENOUS
  Filled 2011-05-22: qty 1

## 2011-05-22 MED ORDER — ONDANSETRON HCL 4 MG/2ML IJ SOLN
4.0000 mg | Freq: Once | INTRAMUSCULAR | Status: AC
  Administered 2011-05-22: 4 mg via INTRAVENOUS
  Filled 2011-05-22: qty 2

## 2011-05-22 NOTE — Discharge Instructions (Signed)
Please follow up with Dr Darrold Span on Monday as previously planned.  Take the melting tablets of Zofran as needed for nausea.  Continue to follow all the instructions you were given upon discharge from the hospital.  If you develop more severe pain, worsening nausea and vomiting, or fevers, please return immediately to the ER.  You may return to the ER at any time for worsening condition or any new symptoms that concern you.   Chest Pain, Nonspecific It is often hard to give a specific diagnosis for the cause of chest pain. There is always a chance that your pain could be related to something serious, like a heart attack or a blood clot in the lungs. You need to follow up with your caregiver for further evaluation. More lab tests or other studies such as X-rays, electrocardiography, stress testing, or cardiac imaging may be needed to find the cause of your pain. Most of the time, nonspecific chest pain improves within 2 to 3 days with rest and mild pain medicine. For the next few days, avoid physical exertion or activities that bring on pain. Do not smoke. Avoid drinking alcohol. Call your caregiver for routine follow-up as advised.  SEEK IMMEDIATE MEDICAL CARE IF:  You develop increased chest pain or pain that radiates to the arm, neck, jaw, back, or abdomen.   You develop shortness of breath, increased coughing, or you start coughing up blood.   You have severe back or abdominal pain, nausea, or vomiting.   You develop severe weakness, fainting, fever, or chills.  Document Released: 12/29/2004 Document Revised: 12/18/2010 Document Reviewed: 06/18/2006 [facility] Patient Information 2012 [place], [place].  Small Bowel Obstruction A small bowel obstruction means something is blocking the small intestine. The small intestine is the long tube that connects the stomach to the colon. Treatment depends on what is causing the problem. Treatment also depends on how bad the problem is. HOME CARE Your doctor  may let you go home if your small bowel is not completely blocked.  Rest.   Follow your diet as told by your doctor.   Only drink clear liquids until you start to get better.   Avoid solid foods as told by your doctor.   Only take medicine as told by your doctor.  GET HELP RIGHT AWAY IF:  You have pain or cramps that get worse.   You throw up (vomit) blood.   You feel sick to your stomach (nauseous), or you cannot stop throwing up.   You cannot drink fluids.   You feel confused.   You feel dry or thirsty (dehydrated).   Your belly gets more puffy (bloated).   You have chills.   You have a fever.   You feel weak, or you pass out (faint).  MAKE SURE YOU:  Understand these instructions.   Will watch your condition.   Will get help right away if you are not doing well or get worse.  Document Released: 02/06/2004 Document Revised: 12/18/2010 Document Reviewed: 04/08/2010 [facility] Patient Information 2012 [place], [place].

## 2011-05-22 NOTE — ED Provider Notes (Signed)
Medical screening examination/treatment/procedure(s) were conducted as a shared visit with non-physician practitioner(s) and myself.  I personally evaluated the patient during the encounter  Doug Sou, MD 05/22/11 1622

## 2011-05-22 NOTE — Telephone Encounter (Signed)
Angie RN with [facility] called stating that Krystal [name] has been Having chest pain all night and she called 911 and is on her way to the ed.

## 2011-05-22 NOTE — ED Provider Notes (Signed)
History     CSN: [id number]  Arrival date & time 05/22/11  1191   First MD Initiated Contact with Patient 05/22/11 [phone]      Chief Complaint  Patient presents with  . Chest Pain    (Consider location/radiation/quality/duration/timing/severity/associated sxs/prior treatment) HPI Comments: Pt discharged yesterday following hospitalization for small bowel obstruction related to metastatic endometrial cancer with tumor burden in abdomen.  During hospitalization, pt had G-tube placed, port-a-cath also in place, sent home with home health.  Today patient presents with central chest pain that woke her from sleep at 2am.  Pain is constant, pressure-like, worse with deep inspiration, better with sitting upright, 9/10 intensity.  States this happens to her following neupogen shots but she told her home health nurse about it this morning and that person encouraged her to come to ED.  Denies cough, fever, SOB, nausea, lightheadedness, abdominal pain.  States she went home yesterday and so far everything has been going well with tube feeds and home health.    Patient is a 41 y.o. female presenting with chest pain. The history is provided by the patient and medical records.  Chest Pain Pertinent negatives for primary symptoms include no fever, no shortness of breath, no cough, no abdominal pain and no nausea.     Past Medical History  Diagnosis Date  . Hypertension     SINCE FIRST PREGNANCY  . Asthma   . Constipation   . History of miscarriage   . Preeclampsia   . Postoperative ileus 07/2010  . Iron deficiency anemia     s/p transfusions [facility]  . Uterine cancer   . Bowel obstruction     Past Surgical History  Procedure Date  . Cesarean section   . Abdominal hysterectomy 07-08-10     TAH-BSO, WITH OMENTECTOMY AND BILAT. PELVIC AND PERIAORTIC LYMPH NODE DISSECTION  . Appendectomy     IN CHILDHOOD    Family History  Problem Relation Age of Onset  . Diabetes Mother   . Hypertension Mother    . Hypertension Father     History  Substance Use Topics  . Smoking status: Never Smoker   . Smokeless tobacco: Never Used  . Alcohol Use: Yes     occ.    OB History    Grav Para Term Preterm Abortions TAB SAB Ect Mult Living   4 3 2 1 1  0 1   3      Review of Systems  Constitutional: Negative for fever.  HENT: Negative for congestion, sore throat and trouble swallowing.   Respiratory: Negative for cough and shortness of breath.   Cardiovascular: Positive for chest pain.  Gastrointestinal: Negative for nausea and abdominal pain.    Allergies  Review of patient's allergies indicates no known allergies.  Home Medications   Current Outpatient Rx  Name Route Sig Dispense Refill  . FAT EMULSION 20 % IV EMUL  Run at 17 cc/hr from 6 pm until 8 am. 250 mL 11  . ONDANSETRON 8 MG PO TBDP Oral Take 1 tablet (8 mg total) by mouth every 8 (eight) hours as needed for nausea. 20 tablet 3  . PROMETHAZINE HCL 25 MG RE SUPP Rectal Place 1 suppository (25 mg total) rectally every 4 (four) hours as needed for nausea. 12 each 3  . CLINIMIX +/- ADDITIVES  Cyclic TNA x 14 hours a day.  Run at 50 cc/hr from 6pm-7pm, 182 cc/hr from 7pm-7am, 50 cc/hr from 7 am-8 am and off from 8 am  until 6 pm. 1 Units 11  . CLONIDINE HCL 0.2 MG/24HR TD PTWK Transdermal Place 1 patch (0.2 mg total) onto the skin once a week. 4 patch 3    BP 102/74  Pulse 102  Temp(Src) 98 F (36.7 C) (Oral)  Resp 30  SpO2 100%  LMP 07/02/2010  Physical Exam  Nursing note and vitals reviewed. Constitutional: She is oriented to person, place, and time. She appears well-developed and well-nourished.  HENT:  Head: Normocephalic and atraumatic.  Neck: Neck supple.  Cardiovascular: Normal rate, regular rhythm and normal heart sounds.   Pulmonary/Chest: Tachypnea noted. No respiratory distress. She has decreased breath sounds. She has no wheezes. She has no rhonchi. She has no rales. She exhibits no tenderness.       Shallow  breaths  Abdominal: Soft. Bowel sounds are normal. She exhibits distension. There is generalized tenderness. There is no rigidity, no rebound and no guarding.         Generalized abdominal pain, worse in upper abdomen.   Neurological: She is alert and oriented to person, place, and time.    ED Course  Procedures (including critical care time)  Labs Reviewed  CBC - Abnormal; Notable for the following:    WBC 32.4 (*)    RBC 3.70 (*)    Hemoglobin 10.1 (*)    HCT 31.1 (*)    RDW 16.4 (*)    Platelets 467 (*)    All other components within normal limits  DIFFERENTIAL - Abnormal; Notable for the following:    Neutrophils Relative 95 (*)    Lymphocytes Relative 3 (*)    Monocytes Relative 2 (*)    Neutro Abs 30.8 (*)    All other components within normal limits  COMPREHENSIVE METABOLIC PANEL - Abnormal; Notable for the following:    BUN 34 (*)    Albumin 2.4 (*)    AST 46 (*)    ALT 36 (*)    Alkaline Phosphatase 128 (*)    GFR calc non Af Amer 71 (*)    GFR calc Af Amer 83 (*)    All other components within normal limits  URINALYSIS, ROUTINE W REFLEX MICROSCOPIC - Abnormal; Notable for the following:    Color, Urine AMBER (*) BIOCHEMICALS MAY BE AFFECTED BY COLOR   APPearance CLOUDY (*)    Protein, ur 30 (*)    Leukocytes, UA SMALL (*)    All other components within normal limits  URINE MICROSCOPIC-ADD ON - Abnormal; Notable for the following:    Squamous Epithelial / LPF FEW (*)    Casts GRANULAR CAST (*)    All other components within normal limits  LACTIC ACID, PLASMA  TROPONIN I   Dg Chest 2 View  05/22/2011  *RADIOLOGY REPORT*  Clinical Data: Chest pain  CHEST - 2 VIEW  Comparison: 05/03/2011  Findings: Mild cardiomegaly.  Stable right internal jugular vein Port-A-Cath device.  Basilar atelectasis improved.  No pneumothorax.  Normal vascularity.  No pleural effusion.  IMPRESSION: Bibasilar atelectasis.  Cardiomegaly.  Original Report Authenticated By: Donavan Burnet,  M.D.   Ct Angio Chest W/cm &/or Wo Cm  05/22/2011  *RADIOLOGY REPORT*  Clinical Data: Chest pain  CT ANGIOGRAPHY CHEST  Technique:  Multidetector CT imaging of the chest using the standard protocol during bolus administration of intravenous contrast. Multiplanar reconstructed images including MIPs were obtained and reviewed to evaluate the vascular anatomy.  Contrast: 80mL OMNIPAQUE IOHEXOL 350 MG/ML SOLN  Comparison: 03/22/2011  Findings: No filling  defects in the pulmonary arterial tree to suggest acute pulmonary thromboembolism.  Anterior mediastinal mass is smaller measuring 8 mm in thickness. Prevascular lymph nodes on image 27 are noted.  The anterior node is not significantly changed.  The more posterior node has improved from 15-11 mm.  Stable left hilar node on image 31.  Bibasilar atelectasis worse airspace disease.  Small left pleural effusion.  The upper liver is unchanged.  Large amount of ascites has developed in the limited images of the abdomen.  IMPRESSION: No evidence of acute pulmonary thromboembolism.  Improved anterior mediastinal mass and minimally improved adenopathy.  Bibasilar atelectasis verses airspace disease.  Small left pleural effusion.  Ascites has developed.  Original Report Authenticated By: Donavan Burnet, M.D.   Dg Abd 2 Views  05/22/2011  *RADIOLOGY REPORT*  Clinical Data: Recent small bowel obstruction.  Nausea, vomiting and weakness.  ABDOMEN - 2 VIEW  Comparison: CT 05/18/2011 and radiographs 05/14/2011.  Findings: There is persistent moderate small bowel distension within the mid abdomen with air-fluid levels on the erect examination.  Enteric contrast remains within the colon which is decompressed.  No free intraperitoneal air or extravasated enteric contrast is seen.  There are surgical clips in the right pelvis and bibasilar atelectasis. Percutaneous G tube is noted.  IMPRESSION: Persistent partial small-bowel obstruction.  Original Report Authenticated By: Gerrianne Scale, M.D.    9:46 AM Discussed patient with Dr Ethelda Chick.   10:33 AM Patient actively vomiting, medication has been ordered but not yet given as patient's port had to be accessed.  Family requesting g-tube be returned to suction - Dr Ethelda Chick and I have asked RN Nehemiah Settle to do this.    Patient feeling much better after medications and g-tube to suction.  Offered admission, pt prefers to go home.  Dr Ethelda Chick spoke with patient's oncologist with whom patient has an appointment on Monday.  Pt has ODT zofran at home and suction for g-tube.  Plan is for d/c home.    Date: 05/22/2011  Rate: 99  Rhythm: normal sinus rhythm  QRS Axis: normal  Intervals: normal  ST/T Wave abnormalities: nonspecific ST/T changes  Conduction Disutrbances:none  Narrative Interpretation:   Old EKG Reviewed: unchanged EKG reviewed with Dr Ethelda Chick.     1. Metastatic cancer   2. Chest discomfort   3. Nausea & vomiting   4. Partial small bowel obstruction       MDM  Patient with metastatic endometrial cancer, released yesterday from hospital following admission for SBO, g-tube placed, pt on TPN feeds currently.  Has home health in place.  Pt developed chest pain one day after neupogen injection, which she states is typical for her and oncologist agrees with this.  Workup shows elevated WBC count, likely from neupogen, new slight elevation in LFTs, expected persistent partial small bowel obstruction.  Pt d/c home with oncology follow up on Monday.  Pt to return for worsening condition, return precautions given.  Patient verbalizes understanding and agrees with plan.  Rise Patience, [place] 05/22/11 1606

## 2011-05-22 NOTE — ED Notes (Signed)
Clinical Social Worker introduced self to Pt and offered emotional support. Pt discharged [facility] yesterday after a 3 week hospitalization. CSW was contacted by Kathrin Penner, MSW, LCSWA re: pt's potential needs while in ED. Pt shared that she is uncomfortable from chest pains but appreciates the support. Pt has a family member and friend at bedside at this time. CSW available to f/u as needed while in the ED. Frederico Hamman, LCSW [phone]

## 2011-05-22 NOTE — ED Notes (Signed)
Pt c/o chest pain since 0200 that radiates to throat. Pt had chemo yesterday for uterine ca. Pt has rt chest port. Pt given 324mg  of asa by EMS

## 2011-05-22 NOTE — ED Provider Notes (Addendum)
Complains of anterior chest left-sided gradual onset worse with lying down 3 AM today accompanied by multiple episodes of vomiting and some shortness of breath no other complaint patient has had similar episodes in the past after receiving and epogen shot. Last epogen shot received yesterday .' On exam alert chronically ill-appearing heart regular rate and rhythm no murmurs or rubs lungs clear auscultation abdomen nondistended gastrostomy tube in place normal bowel sounds minimally tender at epigastrium no guarding rigidity or rebound  Doug Sou, MD 05/22/11 1114  Spoke with Dr. Darrold Span, and has chronic partial small bowel obstruction which can be treated at home with suction connected to G-tube. Chest pain is a side effect of Neupogen injection and leukocytosis is expected after Neupogen injectionwhich she had yesterday. Patient wishes to go home. She has an appointment with Dr.Livesay on 05/25/2011.  Doug Sou, MD 05/22/11 1330

## 2011-05-22 NOTE — ED Notes (Signed)
IV team in room now 

## 2011-05-22 NOTE — ED Notes (Signed)
IV team paged to flush rt chest port

## 2011-05-22 NOTE — ED Notes (Signed)
Pt d/c home with family and home health. PA-C gave d/c inst and pt verbalized understanding.

## 2011-05-22 NOTE — ED Notes (Signed)
G-Tube attached to suction per MD. Pt c/o nausea and is vomiting small amt of green emesis.

## 2011-05-22 NOTE — ED Notes (Signed)
PA-C in room at this time. Pt states she was awakened from sleep around 0200 with midsternal chest pain. Pt states it hurts to take a deep breath, denies cough and fever, denies nausea at this time. No abd pain and states feedings through feeding tube are going well. Pain relieved when sitting up. Pt restless in bed in attempt to get comfortable.

## 2011-05-25 ENCOUNTER — Ambulatory Visit (HOSPITAL_BASED_OUTPATIENT_CLINIC_OR_DEPARTMENT_OTHER): Payer: Self-pay | Admitting: Oncology

## 2011-05-25 ENCOUNTER — Other Ambulatory Visit: Payer: Self-pay

## 2011-05-25 ENCOUNTER — Ambulatory Visit: Payer: Self-pay | Admitting: Gynecology

## 2011-05-25 ENCOUNTER — Telehealth: Payer: Self-pay | Admitting: *Deleted

## 2011-05-25 ENCOUNTER — Encounter: Payer: Self-pay | Admitting: Oncology

## 2011-05-25 ENCOUNTER — Other Ambulatory Visit (HOSPITAL_BASED_OUTPATIENT_CLINIC_OR_DEPARTMENT_OTHER): Payer: Self-pay | Admitting: Lab

## 2011-05-25 ENCOUNTER — Telehealth: Payer: Self-pay | Admitting: Oncology

## 2011-05-25 VITALS — BP 103/73 | HR 112 | Temp 97.8°F | Ht 66.0 in | Wt 192.6 lb

## 2011-05-25 DIAGNOSIS — C549 Malignant neoplasm of corpus uteri, unspecified: Secondary | ICD-10-CM

## 2011-05-25 DIAGNOSIS — D709 Neutropenia, unspecified: Secondary | ICD-10-CM

## 2011-05-25 DIAGNOSIS — C541 Malignant neoplasm of endometrium: Secondary | ICD-10-CM

## 2011-05-25 DIAGNOSIS — M545 Low back pain: Secondary | ICD-10-CM

## 2011-05-25 DIAGNOSIS — K56609 Unspecified intestinal obstruction, unspecified as to partial versus complete obstruction: Secondary | ICD-10-CM

## 2011-05-25 LAB — CBC WITH DIFFERENTIAL/PLATELET
BASO%: 0.5 % (ref 0.0–2.0)
Basophils Absolute: 0 10*3/uL (ref 0.0–0.1)
EOS%: 0 % (ref 0.0–7.0)
HCT: 30.6 % — ABNORMAL LOW (ref 34.8–46.6)
HGB: 9.7 g/dL — ABNORMAL LOW (ref 11.6–15.9)
MCH: 26.4 pg (ref 25.1–34.0)
MCHC: 31.7 g/dL (ref 31.5–36.0)
MCV: 83.2 fL (ref 79.5–101.0)
MONO%: 0.2 % (ref 0.0–14.0)
NEUT%: 88.4 % — ABNORMAL HIGH (ref 38.4–76.8)

## 2011-05-25 LAB — COMPREHENSIVE METABOLIC PANEL
ALT: 20 U/L (ref 0–35)
AST: 24 U/L (ref 0–37)
CO2: 26 mEq/L (ref 19–32)
Calcium: 9.1 mg/dL (ref 8.4–10.5)
Chloride: 99 mEq/L (ref 96–112)
Creatinine, Ser: 0.99 mg/dL (ref 0.50–1.10)
Sodium: 136 mEq/L (ref 135–145)
Total Bilirubin: 0.5 mg/dL (ref 0.3–1.2)
Total Protein: 6.2 g/dL (ref 6.0–8.3)

## 2011-05-25 LAB — MAGNESIUM: Magnesium: 2.1 mg/dL (ref 1.5–2.5)

## 2011-05-25 MED ORDER — HYDROMORPHONE HCL PF 4 MG/ML IJ SOLN
2.0000 mg | Freq: Once | INTRAMUSCULAR | Status: AC
  Administered 2011-05-25: 1 mg via INTRAVENOUS

## 2011-05-25 MED ORDER — ONDANSETRON 8 MG PO TBDP: 8.0000 mg | ORAL_TABLET | Freq: Three times a day (TID) | ORAL | Status: DC | PRN

## 2011-05-25 MED ORDER — FILGRASTIM 300 MCG/0.5ML IJ SOLN
300.0000 ug | Freq: Once | INTRAMUSCULAR | Status: AC
  Administered 2011-05-25: 300 ug via SUBCUTANEOUS
  Filled 2011-05-25: qty 0.5

## 2011-05-25 MED ORDER — MORPHINE SULFATE 20 MG/5ML PO SOLN: 5.0000 mg | ORAL | Status: DC | PRN

## 2011-05-25 MED ORDER — HEPARIN SOD (PORK) LOCK FLUSH 100 UNIT/ML IV SOLN
500.0000 [IU] | Freq: Once | INTRAVENOUS | Status: AC
  Administered 2011-05-25: 500 [IU] via INTRAVENOUS
  Filled 2011-05-25: qty 5

## 2011-05-25 MED ORDER — LACTULOSE SOLN: 30.0000 mL | Freq: Three times a day (TID) | Status: DC

## 2011-05-25 MED ORDER — SODIUM CHLORIDE 0.9 % IJ SOLN
10.0000 mL | INTRAMUSCULAR | Status: DC | PRN
  Administered 2011-05-25: 10 mL via INTRAVENOUS
  Filled 2011-05-25: qty 10

## 2011-05-25 NOTE — Patient Instructions (Signed)
Take prescription for pain medication to [facility] pharmacy today. Use this only if bad pain, as it will make you more constipated.  Roxanol (liquid morphine) 2.5 - 5 ml every 4 hours as needed for severe pain.  Begin lactulose 30 ml three times daily thru G tube -- this is a very sweet liquid laxative. Unhook G tube from suction for 1-2 hrs after each dose of this medicine if possible.   Fine to take sips of any liquids. Would try a few sips every hour and increase if this does not make you too nauseated, especially if not much liquid out of G tube.

## 2011-05-25 NOTE — Telephone Encounter (Signed)
Per staff message from Rose, I have scheduled treatment appts for the patient. Appts in computer and Rose aware.  JMW  

## 2011-05-25 NOTE — Telephone Encounter (Signed)
Gave pt appt for May 2013  Injection, lab, ML /MD and chemo

## 2011-05-25 NOTE — Progress Notes (Signed)
Pt. Pac in right chest was flushed after pt. watched for 30 minutes and declined 2nd mg of dilaudid.  The IV is capped and needle and dressing left dry and intact.  Pt. Gives herself TNA at home.  Pt. Was then discharged ~ 1145 with family and went to the schedulers to make future appointments.

## 2011-05-25 NOTE — Progress Notes (Signed)
OFFICE PROGRESS NOTE Date of Visit 05-25-2011 Physicians: D.ClarkePearson, J.Kinard  INTERVAL HISTORY:  Patient is seen, together with sister and mother, in continuing attention to her metastatic endometrial carcinoma, day 6 cycle 2 adriamycin (single agent), this given day prior to discharge from lengthy hospitalization for bowel obstruction related to the recurrent cancer. She has G tube which was placed by IR during the hospitalization, using intermittent Gomco suction at home now. She is also receiving nightly TNA which is supplied thru an indigent arrangement from [facility]. The hospitalization at [facility] was 4-17 thru 05-21-11. Note mother and sister speak only [place], mother having arrived on an emergent VISA from [place] while Myliah was hospitalized. Denai had received neupogen x1 on 05-21-11. She reported chest pain "from the shot" to [facility] RN on 05-22-11, who called EMS. Patient was evaluated in ED with no new findings of concern. CT angio chest in the ED compared with 03-22-11 showed slight decrease in size of anterior mediastinal mass and one lymph node now 11 mm previously 15 mm.  History is of full term pregnancy delivered by [facility] Nov 2011, then heavy vaginal bleeding from Feb thru June 2012. She was found to have high grade endometrial carcinoma and went to TAH/BSO/omentectomy/nodes by Dr.ClarkePearson at 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 The surgery was optimal debulking. She ws treated with three cycles of taxol/ carboplatin, then RT, then three more cycles of taxol/ carboplatin thru 01-28-2011. CT chest in March was concerning for possible pulmonary mets and PET 04-07-2011 had hypermetabolic nodes in  chest/abdomen/pelvis. She had good LVF on echocardiogram and was treated with first cisplatin/adriamycin on 04-27-11, then admitted with bowel obstruction on 04-29-11. She was seen in hospital by general surgery and gyn oncology, did not have resolution of the bowel obstruction with prolonged NG drainage, was not a candidate for surgery, had G tube placed by IR 05-15-11 and  was paracentesed 2 liters on 05-12-11 and 3 liters on 05-18-11. TNA was begun 05-07-11. Patient declined option of comfort care with Hospice, preferring to continue aggressive treatment for now, in hopes of having a little more time with her 3 small children as her mother assumes their care (note language barrier with children). Dr Nelly Rout consulted in hospital and felt that additional 2 cycles of chemotherapy then reevaluation was reasonable. With overall situation, single agent adriamycin was given for this recent second cycle.  Keionna has felt a little better for past 2 days. Bowels moved x2, once without suppository, and larger amounts than in hospital. She has had 1 cannister filled from G tube in past 3 days, and has gone up to 2 hours without needing G tube suction (in hospital max of 1.5 hrs). She feels thirsty, has tried only sips of water and I have encouraged her to try sips of other liquids more frequently. She has had no problems with TNA, no shortness of breath, no bleeding, has been walking outside with mother. ODT zofran has helped with nausea and we will prescribe more of this.She is able to sleep. Today she has new low back pain "9" which began after she strained with bowels, no pain meds at home. Remainder of 10 point Review of Systems negative.  Objective:  Vital signs in last 24 hours:  BP 103/73  Pulse  112  Temp(Src) 97.8 F (36.6 C) (Oral)  Ht 5\' 6"  (1.676 m)  Wt 192 lb 9.6 oz (87.363 kg)  BMI 31.09 kg/m2  LMP 07/02/2010. Weight was 207 on 04-13-11. Alert, refused WC and walked into office. Obviously in pain prior  to 1 mg IV dilaudid now. G tube placed to suction due to nausea on arrival, no fluid obtained, then some blood which seemed related to higher suction and resolved when suction decreased. HEENT:mucous membranes moist, pharynx normal without lesions Correction mucous membranes slightly dry, somewhat pale, no lesions.PERRL LymphaticsCervical, supraclavicular, and axillary nodes normal. Resp: clear to auscultation bilaterally, diminished breath sounds just in bases Cardio: regular rate and rhythm, tachy, clear heart sounds. GI: seems slightly less distended than when I examined last week, not tight. G tube dressings clean and dry. Extremities: extremities normal, atraumatic, no cyanosis or edema Neuro:nonfocal Skin without rash or petechiae Portacath -without erythema or tenderness, accessed/locked. Lab Results:   Basename 05/25/11 0830  WBC 4.1  HGB 9.7*  HCT 30.6*  PLT 427*  ANC 3.7  BMET  Basename 05/25/11 0830  NA 136  K 5.0  CL 99  CO2 26  GLUCOSE 107*  BUN 35*  CREATININE 0.99  CALCIUM 9.1   AP 138, AST 24, ALT 20, Tprot 6.2, alb up to 3.2, ca 9.1 Studies/Results:  CT angio chest 06/13/2011 as above  Medications: I have reviewed the patient's current medications. Prescription given for oral morphine solution, zofran ODT and lactulose to use 30 cc tid via Gtube. Neupogen is given today due to prior chemo and pelvic RT; note she was neutropenic despite gCSF after adria/CDDP. Assessment/Plan: 1.Recurrent endometrial carcinoma involving chest and with bowel obstruction, which may be slightly improved. Continue support with TNA and G tube at home. She will have #2 neupogen today, neupogen again 5-15 and may need again with PA visit 5-17. I will see her at least 5-28 and I have scheduled her for single agent adriamycin again 5-30. Patient is requesting CDDP with the adria if possible, tho this would probably have to be given on a separate day if she is stable to consider that. 2.PAC  and G tube in 3.chronic, life-long constipation: will try increasing laxatives as above. 4.difficult social situation as noted. Social Worker has talked with her today  Patient was comfortable with discussion and plan. Pain was resolved with 1 mg dilaudid IV via PAC Kharter Brew P, MD   05/25/2011, 8:36 PM

## 2011-05-26 ENCOUNTER — Emergency Department (HOSPITAL_COMMUNITY): Payer: Self-pay

## 2011-05-26 ENCOUNTER — Encounter (HOSPITAL_COMMUNITY): Payer: Self-pay | Admitting: Emergency Medicine

## 2011-05-26 ENCOUNTER — Emergency Department (HOSPITAL_COMMUNITY)
Admission: EM | Admit: 2011-05-26 | Discharge: 2011-05-26 | Disposition: A | Discharge status: Home / Self Care | Payer: Self-pay | Attending: Emergency Medicine | Admitting: Emergency Medicine

## 2011-05-26 ENCOUNTER — Encounter: Payer: Self-pay | Admitting: Oncology

## 2011-05-26 DIAGNOSIS — R112 Nausea with vomiting, unspecified: Secondary | ICD-10-CM | POA: Insufficient documentation

## 2011-05-26 DIAGNOSIS — R142 Eructation: Secondary | ICD-10-CM | POA: Insufficient documentation

## 2011-05-26 DIAGNOSIS — R000 Tachycardia, unspecified: Secondary | ICD-10-CM | POA: Insufficient documentation

## 2011-05-26 DIAGNOSIS — Z79899 Other long term (current) drug therapy: Secondary | ICD-10-CM | POA: Insufficient documentation

## 2011-05-26 DIAGNOSIS — R141 Gas pain: Secondary | ICD-10-CM | POA: Insufficient documentation

## 2011-05-26 DIAGNOSIS — C801 Malignant (primary) neoplasm, unspecified: Secondary | ICD-10-CM | POA: Insufficient documentation

## 2011-05-26 DIAGNOSIS — I10 Essential (primary) hypertension: Secondary | ICD-10-CM | POA: Insufficient documentation

## 2011-05-26 DIAGNOSIS — R109 Unspecified abdominal pain: Secondary | ICD-10-CM | POA: Insufficient documentation

## 2011-05-26 DIAGNOSIS — C7982 Secondary malignant neoplasm of genital organs: Secondary | ICD-10-CM | POA: Insufficient documentation

## 2011-05-26 DIAGNOSIS — J45909 Unspecified asthma, uncomplicated: Secondary | ICD-10-CM | POA: Insufficient documentation

## 2011-05-26 DIAGNOSIS — C799 Secondary malignant neoplasm of unspecified site: Secondary | ICD-10-CM

## 2011-05-26 DIAGNOSIS — R231 Pallor: Secondary | ICD-10-CM | POA: Insufficient documentation

## 2011-05-26 LAB — POCT I-STAT, CHEM 8
BUN: 31 mg/dL — ABNORMAL HIGH (ref 6–23)
Hemoglobin: 9.2 g/dL — ABNORMAL LOW (ref 12.0–15.0)
Potassium: 4.3 mEq/L (ref 3.5–5.1)
Sodium: 137 mEq/L (ref 135–145)
TCO2: 27 mmol/L (ref 0–100)

## 2011-05-26 LAB — CBC
MCH: 26.3 pg (ref 26.0–34.0)
MCHC: 30.8 g/dL (ref 30.0–36.0)
Platelets: 378 10*3/uL (ref 150–400)
RBC: 3.34 MIL/uL — ABNORMAL LOW (ref 3.87–5.11)

## 2011-05-26 MED ORDER — HEPARIN SOD (PORK) LOCK FLUSH 100 UNIT/ML IV SOLN
INTRAVENOUS | Status: AC
  Administered 2011-05-26: 500 [IU]
  Filled 2011-05-26: qty 5

## 2011-05-26 MED ORDER — SODIUM CHLORIDE 0.9 % IV SOLN
Freq: Once | INTRAVENOUS | Status: AC
  Administered 2011-05-26: 04:00:00 via INTRAVENOUS

## 2011-05-26 MED ORDER — ONDANSETRON HCL 4 MG/2ML IJ SOLN
INTRAMUSCULAR | Status: AC
  Administered 2011-05-26: 4 mg via INTRAVENOUS
  Filled 2011-05-26: qty 2

## 2011-05-26 MED ORDER — HYDROMORPHONE HCL PF 1 MG/ML IJ SOLN
1.0000 mg | Freq: Once | INTRAMUSCULAR | Status: AC
  Administered 2011-05-26: 1 mg via INTRAVENOUS
  Filled 2011-05-26: qty 1

## 2011-05-26 MED ORDER — ONDANSETRON HCL 4 MG/2ML IJ SOLN
4.0000 mg | Freq: Once | INTRAMUSCULAR | Status: AC
  Administered 2011-05-26: 4 mg via INTRAVENOUS

## 2011-05-26 NOTE — ED Notes (Signed)
Pt has a Ziploc bag as a drainage bag for her ostomy.

## 2011-05-26 NOTE — ED Notes (Signed)
Pt to xray

## 2011-05-26 NOTE — ED Notes (Signed)
Pt requested pain medication be pushed very slowly because she did not want to throw up. rn asked if pt wanted something for nausea, pt denied.

## 2011-05-26 NOTE — ED Notes (Signed)
PA at bedside.

## 2011-05-26 NOTE — ED Provider Notes (Signed)
The patient has required regular pain medication but has remained over comfortable. Suction was applied to G-tube as per her usual at home as she began to have abdominal distention and pain. Tube suctioning well. Pain improved over time. X-ray done that shows persistent but resolving SBO. She reports normal, non-melanic stool today. No further vomiting. Abdomen is nontender to palpation. She states she is ready for discharge home.   Rodena Medin, PA-C 05/26/11 1215

## 2011-05-26 NOTE — Progress Notes (Signed)
Patient received three prescriptions from [facility] pharmacy on 05/25/11 $19.10,her remaninig balance [facility] $82.67.

## 2011-05-26 NOTE — ED Notes (Signed)
Pt alert, arrives from home, c/o nausea and emesis, pt has peg/drain tube to abd d/t recent abd sx, resp even unlabored, skin pwd, pt has been taking PO fluids, pt has TPN infusion

## 2011-05-26 NOTE — ED Provider Notes (Signed)
Medical screening examination/treatment/procedure(s) were performed by non-physician practitioner and as supervising physician I was immediately available for consultation/collaboration.    Vida Roller, MD 05/26/11 [phone]

## 2011-05-26 NOTE — ED Provider Notes (Signed)
History     CSN: [id number]  Arrival date & time 05/26/11  3086   First MD Initiated Contact with Patient 05/26/11 0405      Chief Complaint  Patient presents with  . Nausea  . Emesis    (Consider location/radiation/quality/duration/timing/severity/associated sxs/prior treatment) HPI Comments: Patient with a history of metastatic endometrial cancer, currently has TPN running through her [facility]-A[facility].  She, states she's been taking her liquid pain medicine, and her antiemetic, but despite this has been vomiting continuously since midnight.  Patient is a 41 y.o. female presenting with vomiting. The history is provided by the patient.  Emesis  This is a recurrent problem. The current episode started 1 to 2 hours ago. The problem occurs continuously. The problem has been gradually worsening. The emesis has an appearance of bilious material. There has been no fever. Associated symptoms include abdominal pain. Pertinent negatives include no chills, no diarrhea and no fever.    Past Medical History  Diagnosis Date  . Hypertension     SINCE FIRST PREGNANCY  . Asthma   . Constipation   . History of miscarriage   . Preeclampsia   . Postoperative ileus 07/2010  . Iron deficiency anemia     s/p transfusions [facility]  . Uterine cancer   . Bowel obstruction     Past Surgical History  Procedure Date  . Cesarean section   . Abdominal hysterectomy 07-08-10     TAH-BSO, WITH OMENTECTOMY AND BILAT. PELVIC AND PERIAORTIC LYMPH NODE DISSECTION  . Appendectomy     IN CHILDHOOD    Family History  Problem Relation Age of Onset  . Diabetes Mother   . Hypertension Mother   . Hypertension Father     History  Substance Use Topics  . Smoking status: Never Smoker   . Smokeless tobacco: Never Used  . Alcohol Use: Yes     occ.    OB History    Grav Para Term Preterm Abortions TAB SAB Ect Mult Living   4 3 2 1 1  0 1   3      Review of Systems  Constitutional: Negative for fever and  chills.  Gastrointestinal: Positive for nausea, vomiting, abdominal pain and abdominal distention. Negative for diarrhea and constipation.  Skin: Positive for pallor.  Neurological: Negative for dizziness and weakness.    Allergies  Review of patient's allergies indicates no known allergies.  Home Medications   Current Outpatient Rx  Name Route Sig Dispense Refill  . CLONIDINE HCL 0.2 MG/24HR TD PTWK Transdermal Place 1 patch (0.2 mg total) onto the skin once a week. 4 patch 3  . LACTULOSE SOLN Gastric Tube 30 mLs by Gastric Tube route 3 (three) times daily. Clamp tube for 60 minutes after putting lactulose in tube. 240 mL 3  . MORPHINE SULFATE 20 MG/5ML PO SOLN Oral Take 1.3-2.5 mLs (5.2-10 mg total) by mouth every 4 (four) hours as needed for pain. 50 mL 0    Prescription given to patient  . ONDANSETRON 8 MG PO TBDP Oral Take 1 tablet (8 mg total) by mouth every 8 (eight) hours as needed for nausea. 30 tablet 3  . PROMETHAZINE HCL 25 MG RE SUPP Rectal Place 1 suppository (25 mg total) rectally every 4 (four) hours as needed for nausea. 12 each 3  . CLINIMIX +/- ADDITIVES  Cyclic TNA x 14 hours a day.  Run at 50 cc/hr from 6pm-7pm, 182 cc/hr from 7pm-7am, 50 cc/hr from 7 am-8 am and off from  8 am until 6 pm. 1 Units 11    BP 118/87  Pulse 110  Temp(Src) 99.2 F (37.3 C) (Oral)  Resp 22  SpO2 100%  LMP 07/02/2010  Physical Exam  Constitutional: She appears well-developed and well-nourished.  HENT:  Head: Normocephalic.  Eyes: Pupils are equal, round, and reactive to light.  Neck: Normal range of motion.  Cardiovascular: Tachycardia present.   Pulmonary/Chest: Effort normal.  Abdominal: She exhibits distension. There is tenderness.  Neurological: She is alert.  Skin: Skin is warm and dry. There is pallor.    ED Course  Procedures (including critical care time)   Labs Reviewed  CBC   No results found.   No diagnosis found.    MDM          Arman Filter,  NP 05/27/11 [phone]

## 2011-05-26 NOTE — ED Notes (Signed)
Pt alert and oriented x4. Respirations even and unlabored, bilateral symmetrical rise and fall of chest. Skin warm and dry. In no acute distress. Denies needs.  Pt suctions set at intermittent 80.

## 2011-05-26 NOTE — Discharge Instructions (Signed)
FOLLOW UP WITH DR. Darrold Span FOR RECHECK THIS WEEK AND RETURN HERE WITH ANY WORSENING SYMPTOMS, RECURRENT DIFFICULTIES DRAINING G-TUBE TO SUCTION, ANY FURTHER BLOOD IN TUBING OR NEW CONCERN.

## 2011-05-26 NOTE — ED Notes (Addendum)
Pt back from xray, pt hooked back up to intermittent suction

## 2011-05-26 NOTE — ED Notes (Signed)
Pt asleep. Family at bedside. No issues to note

## 2011-05-27 ENCOUNTER — Ambulatory Visit: Payer: Self-pay

## 2011-05-27 ENCOUNTER — Emergency Department (HOSPITAL_COMMUNITY): Payer: Medicaid Other

## 2011-05-27 ENCOUNTER — Encounter (HOSPITAL_COMMUNITY): Payer: Self-pay

## 2011-05-27 ENCOUNTER — Telehealth: Payer: Self-pay

## 2011-05-27 ENCOUNTER — Other Ambulatory Visit: Payer: Self-pay | Admitting: Certified Registered Nurse Anesthetist

## 2011-05-27 ENCOUNTER — Inpatient Hospital Stay (HOSPITAL_COMMUNITY)
Admission: EM | Admit: 2011-05-27 | Discharge: 2011-06-06 | DRG: 808 | Disposition: A | Discharge status: Hospice | Payer: Medicaid Other | Attending: Internal Medicine | Admitting: Internal Medicine

## 2011-05-27 DIAGNOSIS — K56600 Partial intestinal obstruction, unspecified as to cause: Secondary | ICD-10-CM | POA: Diagnosis present

## 2011-05-27 DIAGNOSIS — R112 Nausea with vomiting, unspecified: Secondary | ICD-10-CM | POA: Diagnosis present

## 2011-05-27 DIAGNOSIS — K5900 Constipation, unspecified: Secondary | ICD-10-CM | POA: Diagnosis present

## 2011-05-27 DIAGNOSIS — Z79899 Other long term (current) drug therapy: Secondary | ICD-10-CM

## 2011-05-27 DIAGNOSIS — K140 Glossitis: Secondary | ICD-10-CM | POA: Diagnosis not present

## 2011-05-27 DIAGNOSIS — C541 Malignant neoplasm of endometrium: Secondary | ICD-10-CM | POA: Diagnosis present

## 2011-05-27 DIAGNOSIS — R55 Syncope and collapse: Secondary | ICD-10-CM | POA: Diagnosis present

## 2011-05-27 DIAGNOSIS — J45909 Unspecified asthma, uncomplicated: Secondary | ICD-10-CM | POA: Diagnosis present

## 2011-05-27 DIAGNOSIS — D630 Anemia in neoplastic disease: Secondary | ICD-10-CM | POA: Diagnosis present

## 2011-05-27 DIAGNOSIS — I10 Essential (primary) hypertension: Secondary | ICD-10-CM | POA: Diagnosis present

## 2011-05-27 DIAGNOSIS — K56609 Unspecified intestinal obstruction, unspecified as to partial versus complete obstruction: Secondary | ICD-10-CM | POA: Diagnosis present

## 2011-05-27 DIAGNOSIS — Z66 Do not resuscitate: Secondary | ICD-10-CM | POA: Diagnosis not present

## 2011-05-27 DIAGNOSIS — K5909 Other constipation: Secondary | ICD-10-CM | POA: Diagnosis present

## 2011-05-27 DIAGNOSIS — C549 Malignant neoplasm of corpus uteri, unspecified: Secondary | ICD-10-CM | POA: Diagnosis present

## 2011-05-27 DIAGNOSIS — C775 Secondary and unspecified malignant neoplasm of intrapelvic lymph nodes: Secondary | ICD-10-CM | POA: Diagnosis present

## 2011-05-27 DIAGNOSIS — N1330 Unspecified hydronephrosis: Secondary | ICD-10-CM | POA: Diagnosis present

## 2011-05-27 DIAGNOSIS — N179 Acute kidney failure, unspecified: Secondary | ICD-10-CM | POA: Diagnosis present

## 2011-05-27 DIAGNOSIS — D709 Neutropenia, unspecified: Secondary | ICD-10-CM | POA: Diagnosis present

## 2011-05-27 DIAGNOSIS — R180 Malignant ascites: Secondary | ICD-10-CM | POA: Diagnosis present

## 2011-05-27 DIAGNOSIS — D702 Other drug-induced agranulocytosis: Principal | ICD-10-CM | POA: Diagnosis present

## 2011-05-27 DIAGNOSIS — Z9079 Acquired absence of other genital organ(s): Secondary | ICD-10-CM

## 2011-05-27 DIAGNOSIS — T451X5A Adverse effect of antineoplastic and immunosuppressive drugs, initial encounter: Secondary | ICD-10-CM | POA: Diagnosis present

## 2011-05-27 DIAGNOSIS — D638 Anemia in other chronic diseases classified elsewhere: Secondary | ICD-10-CM | POA: Diagnosis present

## 2011-05-27 DIAGNOSIS — R638 Other symptoms and signs concerning food and fluid intake: Secondary | ICD-10-CM | POA: Diagnosis present

## 2011-05-27 DIAGNOSIS — R1033 Periumbilical pain: Secondary | ICD-10-CM

## 2011-05-27 DIAGNOSIS — R509 Fever, unspecified: Secondary | ICD-10-CM

## 2011-05-27 DIAGNOSIS — R5081 Fever presenting with conditions classified elsewhere: Secondary | ICD-10-CM | POA: Diagnosis present

## 2011-05-27 DIAGNOSIS — E876 Hypokalemia: Secondary | ICD-10-CM | POA: Diagnosis present

## 2011-05-27 DIAGNOSIS — J90 Pleural effusion, not elsewhere classified: Secondary | ICD-10-CM | POA: Diagnosis not present

## 2011-05-27 DIAGNOSIS — K1230 Oral mucositis (ulcerative), unspecified: Secondary | ICD-10-CM

## 2011-05-27 DIAGNOSIS — D696 Thrombocytopenia, unspecified: Secondary | ICD-10-CM | POA: Diagnosis present

## 2011-05-27 DIAGNOSIS — I959 Hypotension, unspecified: Secondary | ICD-10-CM | POA: Diagnosis present

## 2011-05-27 DIAGNOSIS — Z931 Gastrostomy status: Secondary | ICD-10-CM

## 2011-05-27 LAB — DIFFERENTIAL
Band Neutrophils: 0 % (ref 0–10)
Band Neutrophils: 0 % (ref 0–10)
Basophils Absolute: 0 10*3/uL (ref 0.0–0.1)
Basophils Relative: 0 % (ref 0–1)
Basophils Relative: 0 % (ref 0–1)
Eosinophils Absolute: 0 10*3/uL (ref 0.0–0.7)
Eosinophils Relative: 0 % (ref 0–5)
Eosinophils Relative: 0 % (ref 0–5)
Lymphocytes Relative: 0 % — ABNORMAL LOW (ref 12–46)
Lymphocytes Relative: 0 % — ABNORMAL LOW (ref 12–46)
Lymphs Abs: 0 10*3/uL — ABNORMAL LOW (ref 0.7–4.0)
Monocytes Absolute: 0 10*3/uL — ABNORMAL LOW (ref 0.1–1.0)
Monocytes Relative: 0 % — ABNORMAL LOW (ref 3–12)
Monocytes Relative: 0 % — ABNORMAL LOW (ref 3–12)
Neutrophils Relative %: 0 % — ABNORMAL LOW (ref 43–77)
Neutrophils Relative %: 0 % — ABNORMAL LOW (ref 43–77)
nRBC: 0 /100 WBC

## 2011-05-27 LAB — CBC
HCT: 26.7 % — ABNORMAL LOW (ref 36.0–46.0)
HCT: 24.4 % — ABNORMAL LOW (ref 36.0–46.0)
Hemoglobin: 8.3 g/dL — ABNORMAL LOW (ref 12.0–15.0)
Hemoglobin: 7.7 g/dL — ABNORMAL LOW (ref 12.0–15.0)
MCH: 26.5 pg (ref 26.0–34.0)
MCHC: 31.1 g/dL (ref 30.0–36.0)
MCV: 85.3 fL (ref 78.0–100.0)
Platelets: 283 10*3/uL (ref 150–400)
RBC: 3.13 MIL/uL — ABNORMAL LOW (ref 3.87–5.11)
RBC: 2.88 MIL/uL — ABNORMAL LOW (ref 3.87–5.11)
RDW: 16.1 % — ABNORMAL HIGH (ref 11.5–15.5)
WBC: 0.6 10*3/uL — CL (ref 4.0–10.5)
WBC: 0.5 10*3/uL — CL (ref 4.0–10.5)

## 2011-05-27 LAB — MAGNESIUM: Magnesium: 2 mg/dL (ref 1.5–2.5)

## 2011-05-27 LAB — BASIC METABOLIC PANEL
BUN: 30 mg/dL — ABNORMAL HIGH (ref 6–23)
CO2: 26 mEq/L (ref 19–32)
Calcium: 9 mg/dL (ref 8.4–10.5)
Chloride: 100 mEq/L (ref 96–112)
Creatinine, Ser: 0.93 mg/dL (ref 0.50–1.10)
GFR calc Af Amer: 86 mL/min — ABNORMAL LOW (ref >90)
GFR calc non Af Amer: 74 mL/min — ABNORMAL LOW (ref >90)
Glucose, Bld: 87 mg/dL (ref 70–99)
Potassium: 4.5 mEq/L (ref 3.5–5.1)
Sodium: 136 mEq/L (ref 135–145)

## 2011-05-27 LAB — URINALYSIS, ROUTINE W REFLEX MICROSCOPIC
Bilirubin Urine: NEGATIVE
Glucose, UA: NEGATIVE mg/dL
Hgb urine dipstick: NEGATIVE
Ketones, ur: NEGATIVE mg/dL
Leukocytes, UA: NEGATIVE
Nitrite: NEGATIVE
Protein, ur: NEGATIVE mg/dL
Specific Gravity, Urine: 1.024 (ref 1.005–1.030)
Urobilinogen, UA: 0.2 mg/dL (ref 0.0–1.0)
pH: 5.5 (ref 5.0–8.0)

## 2011-05-27 LAB — COMPREHENSIVE METABOLIC PANEL
ALT: 23 U/L (ref 0–35)
Albumin: 2.1 g/dL — ABNORMAL LOW (ref 3.5–5.2)
BUN: 27 mg/dL — ABNORMAL HIGH (ref 6–23)
Calcium: 8.3 mg/dL — ABNORMAL LOW (ref 8.4–10.5)
GFR calc Af Amer: 73 mL/min — ABNORMAL LOW (ref >90)
Glucose, Bld: 79 mg/dL (ref 70–99)
Sodium: 136 mEq/L (ref 135–145)
Total Protein: 6 g/dL (ref 6.0–8.3)

## 2011-05-27 LAB — PROTIME-INR
INR: 1.07 (ref 0.00–1.49)
Prothrombin Time: 14.1 seconds (ref 11.6–15.2)

## 2011-05-27 LAB — PHOSPHORUS: Phosphorus: 3.8 mg/dL (ref 2.3–4.6)

## 2011-05-27 LAB — GLUCOSE, CAPILLARY: Glucose-Capillary: 70 mg/dL (ref 70–99)

## 2011-05-27 LAB — APTT: aPTT: 32 seconds (ref 24–37)

## 2011-05-27 MED ORDER — MORPHINE SULFATE (CONCENTRATE) 20 MG/ML PO SOLN
5.0000 mg | ORAL | Status: DC | PRN
  Administered 2011-05-28 – 2011-06-06 (×4): 10 mg via ORAL
  Filled 2011-05-27 (×5): qty 1

## 2011-05-27 MED ORDER — LACTULOSE 10 GM/15ML PO SOLN
30.0000 g | Freq: Three times a day (TID) | ORAL | Status: DC
  Administered 2011-05-27 – 2011-05-30 (×9): 30 g
  Administered 2011-05-31: 2030 g
  Administered 2011-06-01 – 2011-06-06 (×7): 30 g
  Filled 2011-05-27 (×32): qty 45

## 2011-05-27 MED ORDER — ONDANSETRON HCL 4 MG PO TABS: 4.0000 mg | ORAL_TABLET | Freq: Four times a day (QID) | ORAL | Status: DC | PRN

## 2011-05-27 MED ORDER — VANCOMYCIN HCL IN DEXTROSE 1-5 GM/200ML-% IV SOLN
1000.0000 mg | Freq: Three times a day (TID) | INTRAVENOUS | Status: DC
  Administered 2011-05-27 – 2011-05-28 (×2): 1000 mg via INTRAVENOUS
  Filled 2011-05-27 (×3): qty 200

## 2011-05-27 MED ORDER — PROMETHAZINE HCL 25 MG RE SUPP: 25.0000 mg | RECTAL | Status: DC | PRN

## 2011-05-27 MED ORDER — ACETAMINOPHEN 650 MG RE SUPP
650.0000 mg | Freq: Once | RECTAL | Status: AC
  Administered 2011-05-27: 650 mg via RECTAL
  Filled 2011-05-27: qty 1

## 2011-05-27 MED ORDER — FAT EMULSION 20 % IV EMUL
240.0000 mL | INTRAVENOUS | Status: AC
  Filled 2011-05-27: qty 250

## 2011-05-27 MED ORDER — MORPHINE SULFATE 4 MG/ML IJ SOLN
6.0000 mg | Freq: Once | INTRAMUSCULAR | Status: AC
  Administered 2011-05-27: 4 mg via INTRAVENOUS
  Filled 2011-05-27: qty 1

## 2011-05-27 MED ORDER — LORAZEPAM 1 MG PO TABS
1.0000 mg | ORAL_TABLET | ORAL | Status: DC | PRN
  Administered 2011-05-29 – 2011-06-06 (×9): 1 mg via ORAL
  Filled 2011-05-27 (×10): qty 1

## 2011-05-27 MED ORDER — LIP MEDEX EX OINT
TOPICAL_OINTMENT | CUTANEOUS | Status: DC | PRN
  Filled 2011-05-27: qty 7

## 2011-05-27 MED ORDER — PROMETHAZINE HCL 25 MG/ML IJ SOLN
25.0000 mg | Freq: Four times a day (QID) | INTRAMUSCULAR | Status: DC | PRN
  Administered 2011-05-28 – 2011-06-06 (×17): 25 mg via INTRAVENOUS
  Filled 2011-05-27 (×17): qty 1

## 2011-05-27 MED ORDER — ACETAMINOPHEN 325 MG PO TABS: 650.0000 mg | ORAL_TABLET | Freq: Once | ORAL | Status: DC

## 2011-05-27 MED ORDER — CLONIDINE HCL 0.2 MG/24HR TD PTWK: 0.2000 mg | MEDICATED_PATCH | TRANSDERMAL | Status: DC

## 2011-05-27 MED ORDER — TRACE MINERALS CR-CU-MN-SE-ZN 10-1000-500-60 MCG/ML IV SOLN
INTRAVENOUS | Status: AC
  Filled 2011-05-27: qty 2000

## 2011-05-27 MED ORDER — PIPERACILLIN-TAZOBACTAM 3.375 G IVPB
3.3750 g | Freq: Three times a day (TID) | INTRAVENOUS | Status: DC
  Administered 2011-05-27 – 2011-06-05 (×27): 3.375 g via INTRAVENOUS
  Filled 2011-05-27 (×30): qty 50

## 2011-05-27 MED ORDER — CLONIDINE HCL 0.1 MG/24HR TD PTWK
0.1000 mg | MEDICATED_PATCH | TRANSDERMAL | Status: DC
  Administered 2011-06-02: 0.1 mg via TRANSDERMAL
  Filled 2011-05-27: qty 1

## 2011-05-27 MED ORDER — FILGRASTIM 300 MCG/ML IJ SOLN
300.0000 ug | Freq: Every day | INTRAMUSCULAR | Status: DC
  Administered 2011-05-28: 300 ug via SUBCUTANEOUS
  Filled 2011-05-27 (×5): qty 1

## 2011-05-27 MED ORDER — MORPHINE SULFATE 20 MG/5ML PO SOLN: 5.0000 mg | ORAL | Status: DC | PRN

## 2011-05-27 MED ORDER — FILGRASTIM 300 MCG/ML IJ SOLN
300.0000 ug | INTRAMUSCULAR | Status: AC
  Administered 2011-05-27: 300 ug via SUBCUTANEOUS
  Filled 2011-05-27: qty 1

## 2011-05-27 MED ORDER — SODIUM CHLORIDE 0.9 % IJ SOLN
3.0000 mL | Freq: Two times a day (BID) | INTRAMUSCULAR | Status: DC
  Administered 2011-05-27 – 2011-06-02 (×9): 3 mL via INTRAVENOUS

## 2011-05-27 MED ORDER — LACTULOSE SOLN: 30.0000 mL | Freq: Three times a day (TID) | Status: DC

## 2011-05-27 MED ORDER — ONDANSETRON HCL 4 MG/2ML IJ SOLN
4.0000 mg | Freq: Four times a day (QID) | INTRAMUSCULAR | Status: DC | PRN
  Administered 2011-05-27: 4 mg via INTRAVENOUS
  Filled 2011-05-27: qty 2

## 2011-05-27 MED ORDER — VANCOMYCIN HCL IN DEXTROSE 1-5 GM/200ML-% IV SOLN
1000.0000 mg | Freq: Once | INTRAVENOUS | Status: DC
  Administered 2011-05-27: 1000 mg via INTRAVENOUS
  Filled 2011-05-27: qty 200

## 2011-05-27 MED ORDER — INSULIN ASPART 100 UNIT/ML ~~LOC~~ SOLN
0.0000 [IU] | Freq: Four times a day (QID) | SUBCUTANEOUS | Status: DC
  Administered 2011-05-29 (×2): 2 [IU] via SUBCUTANEOUS

## 2011-05-27 MED ORDER — HYDROMORPHONE HCL PF 1 MG/ML IJ SOLN
1.0000 mg | INTRAMUSCULAR | Status: DC | PRN
  Administered 2011-05-27 – 2011-05-29 (×7): 1 mg via INTRAVENOUS
  Filled 2011-05-27 (×7): qty 1

## 2011-05-27 MED ORDER — MORPHINE SULFATE 4 MG/ML IJ SOLN
6.0000 mg | Freq: Once | INTRAMUSCULAR | Status: AC
  Administered 2011-05-27: 6 mg via INTRAVENOUS
  Filled 2011-05-27: qty 2

## 2011-05-27 MED ORDER — PIPERACILLIN-TAZOBACTAM 3.375 G IVPB
3.3750 g | Freq: Once | INTRAVENOUS | Status: AC
  Administered 2011-05-27: 3.375 g via INTRAVENOUS
  Filled 2011-05-27: qty 50

## 2011-05-27 NOTE — H&P (Signed)
PCP:  [name], MD, MD   DOA:  05/27/2011  4:10 AM  Chief Complaint:  fever  HPI:  Krystal [name] is a 41 year old female with history of endometrial carcinoma on chemotherapy with adriamycin (cycle 2 given 05/20/2011) complicated by small bowel obstruction requiring gastric tube drainage presented to ED with complaints of fever at home. Patient reports her abdominal pain is getting better since her discahrge 05/21/2011. Patient reports pain is again in the upper to mid abdominal area, 5-6/10 in intensity, non radiating and associated with abdominal distention. Pain is worse with movement and somewhat relieved by rest but usually patient required narcotics for better pain control. Patient reports that dilaudid help tremendously with pain control. No reports of chest pain, no shortness of breath, no cough, no palpitations. No reports of dysuria, no vomiting and no diarrhea.   Assessment/Plan:   Principal Problem:   *Neutropenic fever - secondary to immunosuppressed status secondary to malignancy and chemotherapy treatment - per oncology next chemotherapy was supposed to be given 3 weeks from 05/20/2011 - we will start patient on empiric antibiotics with vancomycin and zosyn - follow up blood cultures, urine culture results - Neupogen 300 mg x 1 dose given stat on admission and we will continue this dosage on daily basis per oncology - appreciate oncology following  Active Problems:   Partial small bowel obstruction  - Treated conservatively with bowel rest and NG tube decompression of the stomach.  - A G-tube was placed by interventional radiology for symptom management on 05/14/2011.  - TNA per pharmacy for nutritional support  Endometrial ca / Ascites  - Status post treatment with cisplatin/Adriamycin (cycle 2 given 05/20/2011) - patient continues to complain of abdominal distention - abdominal x ray with findings of resolving but not yet completely resolved small bowel obstruction -  we will obtain abdominal ultrasound and evaluate if patient needs paracentesis (Paracentesis done on 05/12/2011 and 05/18/2011  Chronic constipation - Continue bowel regimen as tolerated.  Inadequate oral intake - TNA for better nutritional support  Hypertension  - patient is hypotensive on admission; BP 90/67 - likely secondary to dehydration - hold off on antihypertensives for now  Anemia of chronic disease - secondary to malignancy - hemoglobin 8.3 on admission - we will continue to monitor CBC  Code Status: Full   Family Communication: Updated at bedside.   Disposition Plan: Home, when stable.   Medical Consultants:  Dr. Jama Flavors, Oncology   Other consultants:  Pharmacy - for TNA and antibiotics dosing  Antibiotics:  Zosyn 05/27/2011---> Vancomycin 05/27/11--->   Allergies: No Known Allergies  Prior to Admission medications   Medication Sig Start Date End Date Taking? Authorizing Provider  cloNIDine (CATAPRES - DOSED IN MG/24 HR) 0.2 mg/24hr patch Place 1 patch (0.2 mg total) onto the skin once a week. 05/21/11 05/20/12 Yes Christina P Rama, MD  Lactulose SOLN 30 mLs by Gastric Tube route 3 (three) times daily. Clamp tube for 60 minutes after putting lactulose in tube. 05/25/11  Yes Lennis Buzzy Han, MD  morphine 20 MG/5ML solution Take 1.3-2.5 mLs (5.2-10 mg total) by mouth every 4 (four) hours as needed for pain. 05/25/11  Yes Lennis P Livesay, MD  ondansetron (ZOFRAN ODT) 8 MG disintegrating tablet Take 1 tablet (8 mg total) by mouth every 8 (eight) hours as needed for nausea. 05/25/11  Yes Lennis Buzzy Han, MD  promethazine (PHENERGAN) 25 MG suppository Place 1 suppository (25 mg total) rectally every 4 (four) hours as needed  for nausea. 05/21/11 05/28/11 Yes Christina P Rama, MD  tpn solution (CLINIMIX E 5/15) 5 % SOLN 2,000 mL with multivitamins adult INJ 10 mL, trace elements Cr-Cu-Mn-Se-Zn 10-998-500-60 MCG/ML SOLN 1 mL Cyclic TNA x 14 hours a day.  Run at 50 cc/hr  from 6pm-7pm, 182 cc/hr from 7pm-7am, 50 cc/hr from 7 am-8 am and off from 8 am until 6 pm. 05/21/11  Yes Maryruth Bun Rama, MD    Past Medical History  Diagnosis Date  . Hypertension     SINCE FIRST PREGNANCY  . Asthma   . Constipation   . History of miscarriage   . Preeclampsia   . Postoperative ileus 07/2010  . Iron deficiency anemia     s/p transfusions [facility]  . Uterine cancer   . Bowel obstruction     Past Surgical History  Procedure Date  . Cesarean section   . Abdominal hysterectomy 07-08-10     TAH-BSO, WITH OMENTECTOMY AND BILAT. PELVIC AND PERIAORTIC LYMPH NODE DISSECTION  . Appendectomy     IN CHILDHOOD    Social History:  reports that she has never smoked. She has never used smokeless tobacco. She reports that she drinks alcohol. She reports that she does not use illicit drugs.  Family History  Problem Relation Age of Onset  . Diabetes Mother   . Hypertension Mother   . Hypertension Father     Review of Systems:  Constitutional: Denies fever, chills, diaphoresis, appetite change and fatigue.  HEENT: Denies photophobia, eye pain, redness, hearing loss, ear pain, congestion, sore throat, rhinorrhea, sneezing, mouth sores, trouble swallowing, neck pain, neck stiffness and tinnitus.   Respiratory: Denies SOB, DOE, cough, chest tightness,  and wheezing.   Cardiovascular: Denies chest pain, palpitations and leg swelling.  Gastrointestinal: per HPI.  Genitourinary: Denies dysuria, urgency, frequency, hematuria, flank pain and difficulty urinating.  Musculoskeletal: Denies myalgias, back pain, joint swelling, arthralgias and gait problem.  Skin: Denies pallor, rash and wound.  Neurological: Denies dizziness, seizures, syncope, weakness, light-headedness, numbness and headaches.  Hematological: Denies adenopathy. Easy bruising, personal or family bleeding history  Psychiatric/Behavioral: Denies suicidal ideation, mood changes, confusion, nervousness, sleep disturbance and  agitation   Physical Exam:  Filed Vitals:   05/27/11 0413  BP: 90/67  Pulse: 110  Temp: 99.7 F (37.6 C)  TempSrc: Oral  Resp: 22  SpO2: 96%    Constitutional: Vital signs reviewed.  Patient is in no acute distress and cooperative with exam. Alert and oriented x3.  Head: Normocephalic and atraumatic Ear: TM normal bilaterally Mouth: no erythema or exudates, MMM Eyes: PERRL, EOMI, conjunctivae normal, No scleral icterus.  Neck: Supple, Trachea midline normal ROM, No JVD, mass, thyromegaly, or carotid bruit present.  Cardiovascular: Regular rhythm, tachycardic S1 normal, S2 normal, no MRG, pulses symmetric and intact bilaterally Pulmonary/Chest: CTAB, no wheezes, rales, or rhonchi Abdominal: distended with tenderness over mid abdominal area but no rebound tenderness or guarding, (+) BS GU: no CVA tenderness Musculoskeletal: No joint deformities, erythema, or stiffness, ROM full and no nontender Ext: no edema and no cyanosis, pulses palpable bilaterally (DP and PT) Hematology: no cervical, inginal, or axillary adenopathy.  Neurological: A&O x3, Strenght is normal and symmetric bilaterally, cranial nerve II-XII are grossly intact, no focal motor deficit, sensory intact to light touch bilaterally.  Skin: Warm, dry and intact. No rash, cyanosis, or clubbing.  Psychiatric: Normal mood and affect. speech and behavior is normal. Judgment and thought content normal. Cognition and memory are normal.   Labs  on Admission:  CBC     Status: Abnormal   05/27/11  5:15 AM      Component Value Range Comment   WBC 0.6 (*) 4.0 - 10.5 (K/uL)    Hemoglobin 8.3 (*) 12.0 - 15.0 (g/dL)    HCT 40.9 (*) 81.1 - 46.0 (%)    MCV 85.3  78.0 - 100.0 (fL)    Platelets 283  150 - 400 (K/uL)   DIFFERENTIAL     Status: Abnormal   05/27/11  5:15 AM      Component Value Range Comment   Neutrophils Relative 0 (*) 43 - 77 (%)    Lymphocytes Relative 0 (*) 12 - 46 (%)    Monocytes Relative 0 (*) 3 - 12 (%)     Eosinophils Relative 0  0 - 5 (%)    Basophils Relative 0  0 - 1 (%)    Band Neutrophils 0  0 - 10 (%)    nRBC 0  0 (/100 WBC)    Lymphs Abs 0.0 (*) 0.7 - 4.0 (K/uL)    Monocytes Absolute 0.0 (*) 0.1 - 1.0 (K/uL)    Eosinophils Absolute 0.0  0.0 - 0.7 (K/uL)    Basophils Absolute 0.0  0.0 - 0.1 (K/uL)    WBC Morphology TOO FEW TO COUNT, SMEAR AVAILABLE FOR REVIEW     BASIC METABOLIC PANEL     Status: Abnormal   Collection Time   05/27/11  5:15 AM      Component Value Range Comment   Sodium 136  135 - 145 (mEq/L)    Potassium 4.5  3.5 - 5.1 (mEq/L)    Chloride 100  96 - 112 (mEq/L)    CO2 26  19 - 32 (mEq/L)    Glucose, Bld 87  70 - 99 (mg/dL)    BUN 30 (*) 6 - 23 (mg/dL)    Creatinine, Ser 9.14  0.50 - 1.10 (mg/dL)    Calcium 9.0  8.4 - 10.5 (mg/dL)    GFR calc non Af Amer 74 (*) >90 (mL/min)    GFR calc Af Amer 86 (*) >90 (mL/min)   URINALYSIS, ROUTINE W REFLEX MICROSCOPIC     Status: Normal   Collection Time   05/27/11  7:00 AM      Component Value Range Comment   Color, Urine YELLOW  YELLOW     APPearance CLEAR  CLEAR     Specific Gravity, Urine 1.024  1.005 - 1.030     pH 5.5  5.0 - 8.0     Glucose, UA NEGATIVE  NEGATIVE (mg/dL)    Hgb urine dipstick NEGATIVE  NEGATIVE     Bilirubin Urine NEGATIVE  NEGATIVE     Ketones, ur NEGATIVE  NEGATIVE (mg/dL)    Protein, ur NEGATIVE  NEGATIVE (mg/dL)    Urobilinogen, UA 0.2  0.0 - 1.0 (mg/dL)    Nitrite NEGATIVE  NEGATIVE     Leukocytes, UA NEGATIVE  NEGATIVE      Radiological Exams on Admission: CXR: IMPRESSION: Shallow inspiration with bilateral basilar atelectasis. Cardiac enlargement. No significant interval change. Abd x ray: IMPRESSION: Resolving but not yet cleared small bowel obstruction. Bibasilar atelectasis.   Time Spent on Admission: Over 45 minutes  Brick Ketcher 05/27/2011, 7:41 AM  [facility] Pager # [phone] Main Office # [phone]

## 2011-05-27 NOTE — Progress Notes (Signed)
INITIAL ADULT NUTRITION ASSESSMENT Date: 05/27/2011   Time: 2:21 PM Reason for Assessment: Consult - TNA  ASSESSMENT: Female 41 y.o.  Dx: Neutropenic fever  Food/Nutrition Related Hx: Pt recently admitted last month at [facility] where she started TNA for partial SBO and had G tube placed for suction, and pt was d/c on TNA. Pt on home TNA of Clinimix 5/15 cyclic over 14 hours which provides 1570 calories, 114g protein. Pt with endometrial CA s/p chemotherapy and radiation. Pt has lost 11 pounds unintentionally in the past month. Pt came to ED with a fever this morning of 101 degrees Farenheit per pt report. Met with pt who currently denies any nausea or vomiting and reports the last time she vomited was between 0530-0600 this morning. Pt reports since discharge she has had on/off nausea/vomiting at home. Pt requesting something to drink, RN paged MD with this request - awaiting to see if liquid diet will be started. Pt reports G tube has been functioning without difficulty. Minimal output noted during visit. DG of abdomen yesterday showed resolving but not yet cleared small bowel obstruction.   Hx:  Past Medical History  Diagnosis Date  . Hypertension     SINCE FIRST PREGNANCY  . Asthma   . Constipation   . History of miscarriage   . Preeclampsia   . Postoperative ileus 07/2010  . Iron deficiency anemia     s/p transfusions [facility]  . Uterine cancer   . Bowel obstruction    Related Meds:  Scheduled Meds:   . acetaminophen  650 mg Rectal Once  . cloNIDine  0.2 mg Transdermal Weekly  . filgrastim (NEUPOGEN)  SQ  300 mcg Subcutaneous STAT  . filgrastim (NEUPOGEN)  SQ  300 mcg Subcutaneous Daily  . insulin aspart  0-9 Units Subcutaneous Q6H  . lactulose  30 g Per Tube TID  .  morphine injection  6 mg Intravenous Once  .  morphine injection  6 mg Intravenous Once  . piperacillin-tazobactam (ZOSYN)  IV  3.375 g Intravenous Once  . piperacillin-tazobactam (ZOSYN)  IV  3.375 g  Intravenous Q8H  . sodium chloride  3 mL Intravenous Q12H  . vancomycin  1,000 mg Intravenous Q8H  . DISCONTD: acetaminophen  650 mg Oral Once  . DISCONTD: Lactulose  30 mL Gastric Tube TID  . DISCONTD: vancomycin  1,000 mg Intravenous Once   Continuous Infusions:   . TPN (CLINIMIX) +/- additives     And  . fat emulsion     PRN Meds:.HYDROmorphone (DILAUDID) injection, morphine, ondansetron (ZOFRAN) IV, ondansetron, promethazine, DISCONTD: morphine  Ht: 5\' 6"  (167.6 cm)  Wt: 194 lb (87.998 kg)  Ideal Wt: 130 lb % Ideal Wt: 149  Usual Wt: 205 lb % Usual Wt: 95  Body mass index is 31.31 kg/(m^2).   Labs:  CMP     Component Value Date/Time   NA 136 05/27/2011 1230   K 4.0 05/27/2011 1230   CL 100 05/27/2011 1230   CO2 28 05/27/2011 1230   GLUCOSE 79 05/27/2011 1230   BUN 27* 05/27/2011 1230   CREATININE 1.06 05/27/2011 1230   CREATININE 0.55 10/17/2009 1300   CALCIUM 8.3* 05/27/2011 1230   PROT 6.0 05/27/2011 1230   ALBUMIN 2.1* 05/27/2011 1230   AST 32 05/27/2011 1230   ALT 23 05/27/2011 1230   ALKPHOS 123* 05/27/2011 1230   BILITOT 0.5 05/27/2011 1230   GFRNONAA 63* 05/27/2011 1230   GFRAA 73* 05/27/2011 1230    Intake/Output Summary (  Last 24 hours) at 05/27/11 1450 Last data filed at 05/27/11 1420  Gross per 24 hour  Intake      0 ml  Output    350 ml  Net   -350 ml   Last BM - 05/26/11   Diet Order:  NPO    IVF:    TPN (CLINIMIX) +/- additives  And  fat emulsion    Estimated Nutritional Needs:   Kcal: 1750-2050 Protein: 90-105g Fluid: 1.7-2L  NUTRITION DIAGNOSIS: -Inadequate oral intake (NI-2.1).  Status: Ongoing  RELATED TO: partial SBO  AS EVIDENCE BY: DG of abdomen on 05/26/11  MONITORING/EVALUATION(Goals): TNA to meet >90% of estimated nutritional needs.   EDUCATION NEEDS: -No education needs identified at this time  INTERVENTION: TNA per pharmacy. Will monitor.   Dietitian #: [phone]  DOCUMENTATION CODES Per approved criteria  -Obesity  Unspecified    Marshall Cork 05/27/2011, 2:21 PM

## 2011-05-27 NOTE — Progress Notes (Signed)
05/27/2011, 3:51 PM  Hospital day: 1 Antibiotics: day 1 vancomycin/zosyn Chemotherapy: day 8 cycle 2 adriamycin  Neupogen day 3 today Subjective: Patient seen, readmitted this am to hospitalist service now with neutropenic fever, temperature 101 at home early am today.  History is of full term pregnancy delivered by [facility] Nov 2011, then heavy vaginal bleeding from Feb thru June 2012. 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 The surgery was optimal debulking. She ws treated with three cycles of taxol/ carboplatin, then RT, then three more cycles of taxol/ carboplatin thru 01-28-2011. CT chest in March was concerning for possible pulmonary mets and PET 04-07-2011 had hypermetabolic nodes in chest/abdomen/pelvis. She had good LVF on echocardiogram and was treated with first cisplatin/adriamycin on 04-27-11, then admitted with bowel obstruction on 04-29-11 thru 05-21-11. She was seen in hospital by general surgery and gyn oncology, did not have resolution of the bowel obstruction with prolonged NG drainage, was not a candidate for surgery, had G tube placed by IR 05-15-11 and was paracentesed 2 liters on 05-12-11 and 3 liters on 05-18-11. TNA was begun 05-07-11. Patient declined option of comfort care with Hospice, preferring to continue aggressive treatment for now, in hopes of having a little more time with her 3 small children as her mother assumes their care (note language barrier with children). Dr Nelly Rout consulted in hospital and felt that additional 2 cycles of chemotherapy then reevaluation was reasonable. With overall situation, single  agent adriamycin was given for second cycle on 05-20-11. She had neupogen on 05-21-11 and when she was seen at [facility] on 05-25-11. She has been seen in ED x3 since recent discharge, in addition to follow up at [facility] and [facility].  Problems since discharge home include pain in chest following neupogen shots, low back pain which was much better with IV dilaudid at [facility], nausea and vomiting x3 hrs from MN - 3 am on 5-14, and one time fever as above. With vomiting, she was unable to keep down oral antiemetics. She has had slight nonproductive cough, slight sore throat after vomiting, no problems with PAC, no shaking chills, no different abdominal symptoms, no dysuria. She has felt more weak x 24 hrs, but prior to that was enjoying being back at home and was walking more. Bowels have been moving larger amounts daily until none today.   Note she should not have suppositories while neutropenic.  Review of Systems otherwise: no sinus congestion or drainage, no clear cardiac symptoms, no bleeding. Remainder of 10 point Review of Systems negative.  Objective: Vital signs in last 24 hours: Blood pressure 111/77, pulse 90, temperature 97.8 F (36.6 C), temperature source Oral, resp. rate 20, height 5\' 6"  (1.676 m), weight 194 lb (87.998 kg), last menstrual period 07/02/2010, SpO2 98.00%.   Intake/Output from previous day:   Intake/Output this shift: Total I/O In: -  Out: 350 [Urine:350]  Physical exam:Awake, alert, looks somewhat uncomfortable supine in bed on RA. G tube is clamped post lactulose now. Respirations not labored. PAC infusing antibiotic without difficulty. Oral mucosa pale without lesions, moist. PAC site ok. Heart RRR without gallop, clear heart sounds. Lungs without wheezes or rales, diminished BS in bases. Abdomen more distended  than when I saw her on 05-25-11, few bowel sounds, G tube dressing dry. LE no edema, cords, tenderness. Skin without rash or petechiae. Moves easily in bed.    Lab Results:  Basename 05/27/11 1230 05/27/11 0515  WBC 0.5* 0.6*  HGB 7.7* 8.3*  HCT 24.4* 26.7*  PLT 252 283   BMET  Basename 05/27/11 1230 05/27/11 0515  NA 136 136  K 4.0 4.5  CL 100 100  CO2 28 26  GLUCOSE 79 87  BUN 27* 30*  CREATININE 1.06 0.93  CALCIUM 8.3* 9.0    Studies/Results: Dg Chest 2 View  05/27/2011  *RADIOLOGY REPORT*  Clinical Data: Nausea and vomiting.  Mid abdominal pain.  Fever and cough.  CHEST - 2 VIEW  Comparison: 05/10/2013chest radiograph from [facility].  Findings: Shallow inspiration with bilateral basilar atelectasis similar to previous study.  Cardiac enlargement with normal pulmonary vascularity.  Power port type central venous catheter with tip in the low SVC.  No pneumothorax.  No blunting of costophrenic angles.  Residual contrast material in the colon. Gastrostomy tube.  IMPRESSION: Shallow inspiration with bilateral basilar atelectasis.  Cardiac enlargement.  No significant interval change.  Original Report Authenticated By: Marlon Pel, M.D.   Dg Abd Acute W/chest  05/26/2011  *RADIOLOGY REPORT*  Clinical Data: Nausea and vomiting with abdominal pain and distention.  Hypertension.  Uterine cancer.  ACUTE ABDOMEN SERIES (ABDOMEN 2 VIEW & CHEST 1 VIEW)  Comparison: 05/22/2011.  Findings: Low lung volumes.  Bibasilar atelectasis.  No infiltrates or failure. Normal heart size.  No significant pleural effusion. Port-A-Cath good position.  Residual barium within the colon.  Slight prominence mid abdominal small bowel loops  slightly improved, consistent with resolving small bowel obstruction.  Less barium in the colon.  Slight air- fluid levels.  IMPRESSION: Resolving but not yet cleared small bowel obstruction.  Bibasilar atelectasis.  Original Report Authenticated By: Elsie Stain, M.D.   Present medications reviewed. Phenergan suppositories DCd as she is neutropenic; phenergan IV and SL ativan prns added. She has had neupogen today,  which is third dose, this to be given daily until [facility] recovers. Agree with vanc/zosyn empirically.  Assessment/Plan: 1.Neutropenic fever: day 8 cycle 2 adriamycin, neupogen as above, on empiric broad spectrum antibiotics. Appreciate assistance from ED and Dr.Devine's care. 2. Metastatic high grade endometrial carcinoma: attempting  additional chemotherapy for reasons as above, not surgical candidate and prognosis very poor. Note per my discussion with patient as documented 05-14-11, she does not want life support in irreversible situation, tho she has requested interventions short of that. 3.bowel obstruction, at least partial, using G tube prn to decompress stomach and on TNA as unable to take oral nutrition. TNA is planned short term to support with this attempt at chemotherapy. 4.life long, severe constipation: bowels are moving more in past week, trying lactulose per G tube since 05-25-11. No suppositories or enemas while neutropenic. 5.Progressive anemia related to chemotherapy:  may need PRBCs in next day or so if continues to drop/ if symptoms. (Platelets still good) 6.Difficult social situation: she is not [place] citizen so not eligible for any insurance coverage, 3 small children at home. Family speaks mostly [place]. I have let [facility] SW know of admission and requested chaplain follow. [facility] supplied by [facility].  Discussed with Dr.Devine by phone this am and with RN now. Will follow with you. Thank you. Marguita Venning P

## 2011-05-27 NOTE — Progress Notes (Signed)
ANTIBIOTIC CONSULT NOTE - INITIAL  Pharmacy Consult for Vanc and Zosyn Indication: neutropenic fever  No Known Allergies  Patient Measurements: Height: 5\' 6"  (167.6 cm) Weight: 194 lb (87.998 kg) IBW/kg (Calculated) : 59.3   Vital Signs: Temp: 99 F (37.2 C) (05/15 1041) Temp src: Oral (05/15 1041) BP: 113/80 mmHg (05/15 1041) Pulse Rate: 91  (05/15 1041) Labs:  Basename 05/27/11 0515 05/26/11 0431 05/26/11 0425 05/25/11 0830  WBC 0.6* -- 3.0* 4.1  HGB 8.3* 9.2* 8.8* --  PLT 283 -- 378 427*  LABCREA -- -- -- --  CREATININE 0.93 0.90 -- 0.99   Estimated Creatinine Clearance: 87.2 ml/min (by C-G formula based on Cr of 0.93). No results found for this basename: VANCOTROUGH:2,VANCOPEAK:2,VANCORANDOM:2,GENTTROUGH:2,GENTPEAK:2,GENTRANDOM:2,TOBRATROUGH:2,TOBRAPEAK:2,TOBRARND:2,AMIKACINPEAK:2,AMIKACINTROU:2,AMIKACIN:2, in the last 72 hours   Microbiology: Recent Results (from the past 720 hour(s))  URINE CULTURE     Status: Normal   Collection Time   04/29/11 10:17 PM      Component Value Range Status Comment   Specimen Description URINE, CLEAN CATCH   Final    Special Requests none   Final    Culture  Setup Time [id number]   Final    Colony Count NO GROWTH   Final    Culture NO GROWTH   Final    Report Status 04/30/2011 FINAL   Final   URINE CULTURE     Status: Normal   Collection Time   05/03/11 10:27 PM      Component Value Range Status Comment   Specimen Description URINE, RANDOM   Final    Special Requests NONE   Final    Culture  Setup Time [id number]   Final    Colony Count 25,000 COLONIES/ML   Final    Culture     Final    Value: Multiple bacterial morphotypes present, none predominant. Suggest appropriate recollection if clinically indicated.   Report Status 05/05/2011 FINAL   Final   CULTURE, BLOOD (ROUTINE X 2)     Status: Normal   Collection Time   05/03/11 11:10 PM      Component Value Range Status Comment   Specimen Description BLOOD RIGHT [facility]   Final      Special Requests BOTTLES DRAWN AEROBIC AND ANAEROBIC 5CC   Final    Culture  Setup Time [id number]   Final    Culture NO GROWTH 5 DAYS   Final    Report Status 05/10/2011 FINAL   Final     Medical History: Past Medical History  Diagnosis Date  . Hypertension     SINCE FIRST PREGNANCY  . Asthma   . Constipation   . History of miscarriage   . Preeclampsia   . Postoperative ileus 07/2010  . Iron deficiency anemia     s/p transfusions [facility]  . Uterine cancer   . Bowel obstruction    Medications:  Anti-infectives     Start     Dose/Rate Route Frequency Ordered Stop   05/27/11 0645   vancomycin (VANCOCIN) IVPB 1000 mg/200 mL premix  Status:  Discontinued        1,000 mg 200 mL/hr over 60 Minutes Intravenous  Once 05/27/11 0622 05/27/11 1101   05/27/11 0645  piperacillin-tazobactam (ZOSYN) IVPB 3.375 g       3.375 g 100 mL/hr over 30 Minutes Intravenous  Once 05/27/11 0622 05/27/11 0720         Assessment:  41 yoF admit earlier this month, seen in ED yesterday, and admit today with neutropenic fever  Renal function wnl, CrCl ~87 ml/min  Blood and Urine cultures ordered  First doses of abx given in ED  Goal of Therapy:  Vancomycin trough level 15-20 mcg/ml  Plan:   Zosyn 3.375g IV Q8H infused over 4hrs.  Vancomycin 1g IV q8h.  Measure Vanc trough at steady state.  Follow up renal fxn and culture results.   Lynann Beaver PharmD, BCPS Pager [phone] 05/27/2011 11:14 AM

## 2011-05-27 NOTE — ED Provider Notes (Signed)
Medical screening examination/treatment/procedure(s) were performed by non-physician practitioner and as supervising physician I was immediately available for consultation/collaboration.    Vida Roller, MD 05/27/11 [phone]

## 2011-05-27 NOTE — ED Provider Notes (Signed)
History     CSN: [id number]  Arrival date & time 05/27/11  0407   First MD Initiated Contact with Patient 05/27/11 0430      Chief Complaint  Patient presents with  . Fever  . Cough    (Consider location/radiation/quality/duration/timing/severity/associated sxs/prior treatment) HPI Since the emergency department with cough and fever.  She states that she is also having abdominal pain that is chronic for her.  Patient, states that she has had nausea, but no vomiting.  Patient denies chest pain, shortness of breath headache visual changes, dizziness, back pain, diarrhea, or syncope.  Patient denies taking any medications prior to arrival other than her normal medications.     Past Medical History  Diagnosis Date  . Hypertension     SINCE FIRST PREGNANCY  . Asthma   . Constipation   . History of miscarriage   . Preeclampsia   . Postoperative ileus 07/2010  . Iron deficiency anemia     s/p transfusions [facility]  . Uterine cancer   . Bowel obstruction     Past Surgical History  Procedure Date  . Cesarean section   . Abdominal hysterectomy 07-08-10     TAH-BSO, WITH OMENTECTOMY AND BILAT. PELVIC AND PERIAORTIC LYMPH NODE DISSECTION  . Appendectomy     IN CHILDHOOD    Family History  Problem Relation Age of Onset  . Diabetes Mother   . Hypertension Mother   . Hypertension Father     History  Substance Use Topics  . Smoking status: Never Smoker   . Smokeless tobacco: Never Used  . Alcohol Use: Yes     occ.    OB History    Grav Para Term Preterm Abortions TAB SAB Ect Mult Living   4 3 2 1 1  0 1   3      Review of Systems All other systems negative except as documented in the HPI. All pertinent positives and negatives as reviewed in the HPI.  Allergies  Review of patient's allergies indicates no known allergies.  Home Medications   Current Outpatient Rx  Name Route Sig Dispense Refill  . CLONIDINE HCL 0.2 MG/24HR TD PTWK Transdermal Place 1 patch (0.2 mg  total) onto the skin once a week. 4 patch 3  . LACTULOSE SOLN Gastric Tube 30 mLs by Gastric Tube route 3 (three) times daily. Clamp tube for 60 minutes after putting lactulose in tube. 240 mL 3  . MORPHINE SULFATE 20 MG/5ML PO SOLN Oral Take 1.3-2.5 mLs (5.2-10 mg total) by mouth every 4 (four) hours as needed for pain. 50 mL 0    Prescription given to patient  . ONDANSETRON 8 MG PO TBDP Oral Take 1 tablet (8 mg total) by mouth every 8 (eight) hours as needed for nausea. 30 tablet 3  . PROMETHAZINE HCL 25 MG RE SUPP Rectal Place 1 suppository (25 mg total) rectally every 4 (four) hours as needed for nausea. 12 each 3  . CLINIMIX +/- ADDITIVES  Cyclic TNA x 14 hours a day.  Run at 50 cc/hr from 6pm-7pm, 182 cc/hr from 7pm-7am, 50 cc/hr from 7 am-8 am and off from 8 am until 6 pm. 1 Units 11    BP 90/67  Pulse 110  Temp(Src) 99.7 F (37.6 C) (Oral)  Resp 22  SpO2 96%  LMP 07/02/2010  Physical Exam Physical Examination: General appearance - alert, well appearing, and in no distress and oriented to person, place, and time Mental status - alert, oriented to person,  place, and time Eyes - pupils equal and reactive, extraocular eye movements intact Ears - bilateral TM's and external ear canals normal Nose - normal and patent, no erythema, discharge or polyps Mouth - mucous membranes moist, pharynx normal without lesions Neck - supple, no significant adenopathy Lymphatics - no palpable lymphadenopathy, no hepatosplenomegaly Chest - clear to auscultation, no wheezes, rales or rhonchi, symmetric air entry Heart - normal rate, regular rhythm, normal S1, S2, no murmurs, rubs, clicks or gallops Abdomen - tenderness noted .  Patient has diffuse tenderness about her belly no rebound tenderness noted bowel sounds hypoactive ED Course  Procedures (including critical care time)  Labs Reviewed  CBC - Abnormal; Notable for the following:    WBC 0.6 (*)    RBC 3.13 (*)    Hemoglobin 8.3 (*)    HCT  26.7 (*)    RDW 16.1 (*)    All other components within normal limits  BASIC METABOLIC PANEL - Abnormal; Notable for the following:    BUN 30 (*)    GFR calc non Af Amer 74 (*)    GFR calc Af Amer 86 (*)    All other components within normal limits  DIFFERENTIAL  URINALYSIS, ROUTINE W REFLEX MICROSCOPIC   Dg Chest 2 View  05/27/2011  *RADIOLOGY REPORT*  Clinical Data: Nausea and vomiting.  Mid abdominal pain.  Fever and cough.  CHEST - 2 VIEW  Comparison: 05/10/2013chest radiograph from [facility].  Findings: Shallow inspiration with bilateral basilar atelectasis similar to previous study.  Cardiac enlargement with normal pulmonary vascularity.  Power port type central venous catheter with tip in the low SVC.  No pneumothorax.  No blunting of costophrenic angles.  Residual contrast material in the colon. Gastrostomy tube.  IMPRESSION: Shallow inspiration with bilateral basilar atelectasis.  Cardiac enlargement.  No significant interval change.  Original Report Authenticated By: Marlon Pel, M.D.   Dg Abd Acute W/chest  05/26/2011  *RADIOLOGY REPORT*  Clinical Data: Nausea and vomiting with abdominal pain and distention.  Hypertension.  Uterine cancer.  ACUTE ABDOMEN SERIES (ABDOMEN 2 VIEW & CHEST 1 VIEW)  Comparison: 05/22/2011.  Findings: Low lung volumes.  Bibasilar atelectasis.  No infiltrates or failure. Normal heart size.  No significant pleural effusion. Port-A-Cath good position.  Residual barium within the colon.  Slight prominence mid abdominal small bowel loops  slightly improved, consistent with resolving small bowel obstruction.  Less barium in the colon.  Slight air- fluid levels.  IMPRESSION: Resolving but not yet cleared small bowel obstruction.  Bibasilar atelectasis.  Original Report Authenticated By: Elsie Stain, M.D.    Patient will be admitted to the hospital for neutropenic fever.  Dr. Dierdre Highman made the phone call for admission.  He also saw the patient, along  with me.   MDM          Carlyle Dolly, PA-C 06/01/11 2224

## 2011-05-27 NOTE — ED Notes (Signed)
Pt states she cannot take po medications, and that her tpn was stopped now and has to be turned off and nothing in tube for 1 hour.  Ebbie Ridge PA at bedside and aware will follow new order when given

## 2011-05-27 NOTE — ED Notes (Signed)
AVW:[id number]<[id number]> Expected date:<BR> Expected time:<BR> Means of arrival:<BR> Comments:<BR> Hold for rm 14

## 2011-05-27 NOTE — Telephone Encounter (Signed)
Faxed signed TPN dated 05-27-11 to [facility],  Sent a copy to HIM to scan into Pt.'s emr.

## 2011-05-27 NOTE — Progress Notes (Signed)
PARENTERAL NUTRITION CONSULT NOTE - INITIAL  Pharmacy Consult for TNA Indication: Hx of pSBO  No Known Allergies  Patient Measurements: Height: 5\' 6"  (167.6 cm) Weight: 194 lb (87.998 kg) IBW/kg (Calculated) : 59.3  Adjusted Body Weight:   Vital Signs: Temp: 99 F (37.2 C) (05/15 1041) Temp src: Oral (05/15 1041) BP: 113/80 mmHg (05/15 1041) Pulse Rate: 91  (05/15 1041) Intake/Output from previous day:    Labs:  Basename 05/27/11 0515 05/26/11 0431 05/26/11 0425 05/25/11 0830  WBC 0.6* -- 3.0* 4.1  HGB 8.3* 9.2* 8.8* --  HCT 26.7* 27.0* 28.6* --  PLT 283 -- 378 427*  APTT -- -- -- --  INR -- -- -- --     Basename 05/27/11 0515 05/26/11 0431 05/25/11 0830  NA 136 137 136  K 4.5 4.3 5.0  CL 100 103 99  CO2 26 -- 26  GLUCOSE 87 154* 107*  BUN 30* 31* 35*  CREATININE 0.93 0.90 0.99  LABCREA -- -- --  CREAT24HRUR -- -- --  CALCIUM 9.0 -- 9.1  MG -- -- 2.1  PHOS -- -- --  PROT -- -- 6.2  ALBUMIN -- -- 3.2*  AST -- -- 24  ALT -- -- 20  ALKPHOS -- -- 138*  BILITOT -- -- 0.5  BILIDIR -- -- --  IBILI -- -- --  PREALBUMIN -- -- --  TRIG -- -- --  CHOLHDL -- -- --  CHOL -- -- --   Estimated Creatinine Clearance: 87.2 ml/min (by C-G formula based on Cr of 0.93).   No results found for this basename: GLUCAP:3 in the last 72 hours  Medical History: Past Medical History  Diagnosis Date  . Hypertension     SINCE FIRST PREGNANCY  . Asthma   . Constipation   . History of miscarriage   . Preeclampsia   . Postoperative ileus 07/2010  . Iron deficiency anemia     s/p transfusions [facility]  . Uterine cancer   . Bowel obstruction     Medications:  Scheduled:    . acetaminophen  650 mg Rectal Once  . cloNIDine  0.2 mg Transdermal Weekly  . filgrastim (NEUPOGEN)  SQ  300 mcg Subcutaneous STAT  . filgrastim (NEUPOGEN)  SQ  300 mcg Subcutaneous Daily  . lactulose  30 g Per Tube TID  .  morphine injection  6 mg Intravenous Once  .  morphine injection  6 mg  Intravenous Once  . piperacillin-tazobactam (ZOSYN)  IV  3.375 g Intravenous Once  . sodium chloride  3 mL Intravenous Q12H  . DISCONTD: acetaminophen  650 mg Oral Once  . DISCONTD: Lactulose  30 mL Gastric Tube TID  . DISCONTD: vancomycin  1,000 mg Intravenous Once   Infusions:   PRN: HYDROmorphone (DILAUDID) injection, morphine, ondansetron (ZOFRAN) IV, ondansetron, promethazine, DISCONTD: morphine  Insulin Requirements in the past 24 hours:  Glucose 87 on admission  Current Nutrition:  Outpatient TNA:  Clinimix E 5/15 cyclic over 14 hours (50 ml/hr from 6pm-7pm, 182 ml/hr from 7pm-7am, and 50 ml/hr from 7am-8am.  Off from 8am until 6pm)  Last dose 5/14.  Assessment:  77 yoF admit with cyclic TNA, initially started inpatient on 4/25 for partial SBO   Electrolytes currently wnl  Nutritional Goals:   From admission 05/2011 RD recs:  Kcal 1750-2050/day, Protein 70-90g/day, Fluid 1.7-2L/day.   Clinimix 5/15 at goal of 1964ml/day and MWF only fat emulsion 20% 240 ml/day = average of 1569 kcal/day and 96g protein/day  Plan:  At 1800 today:  Resume Cyclic Clinimix E 5/15 over 14 hours  Fat emulsion at 46ml/hr over 14 hours (MWF only due to ongoing shortage).  TNA to contain standard multivitamins and trace elements (MWF only due to ongoing shortage).  IVF currently at [facility]  (Previously while TNA off, pt was on NS w/ KCl/L @75ml /hr)  Insulin: sensitive SSI Q6h  TNA lab panels on Mondays & Thursdays.  F/u daily.   Lynann Beaver PharmD, BCPS Pager [phone] 05/27/2011 11:36 AM

## 2011-05-27 NOTE — ED Notes (Signed)
Christy with lab called,  WBC's dropped to 0.6,  Ebbie Ridge PA aware

## 2011-05-27 NOTE — ED Notes (Signed)
Pt states that she went to bed with a dry cough and woke up about 3 am with a fever of 101

## 2011-05-27 NOTE — ED Notes (Signed)
Zosyn stopped after orders placed for blood cultures x two,  Will need to be restarted,  Approximately 20 cc infused.

## 2011-05-27 NOTE — Progress Notes (Signed)
Upon assessment, Vancomycin hanging as secondary has tubing clamped. Dose not received. Unclamped line and notified IV team of med being off schedule. MD text paged for notification as well.

## 2011-05-28 ENCOUNTER — Inpatient Hospital Stay (HOSPITAL_COMMUNITY): Payer: Medicaid Other

## 2011-05-28 DIAGNOSIS — N8502 Endometrial intraepithelial neoplasia [EIN]: Secondary | ICD-10-CM

## 2011-05-28 DIAGNOSIS — R1033 Periumbilical pain: Secondary | ICD-10-CM

## 2011-05-28 DIAGNOSIS — R509 Fever, unspecified: Secondary | ICD-10-CM

## 2011-05-28 DIAGNOSIS — R188 Other ascites: Secondary | ICD-10-CM

## 2011-05-28 LAB — CBC
HCT: 27.2 % — ABNORMAL LOW (ref 36.0–46.0)
RBC: 3.23 MIL/uL — ABNORMAL LOW (ref 3.87–5.11)
RDW: 15.8 % — ABNORMAL HIGH (ref 11.5–15.5)
WBC: 0.4 10*3/uL — CL (ref 4.0–10.5)

## 2011-05-28 LAB — URINE CULTURE: Colony Count: 8000

## 2011-05-28 LAB — MAGNESIUM: Magnesium: 2.1 mg/dL (ref 1.5–2.5)

## 2011-05-28 LAB — COMPREHENSIVE METABOLIC PANEL
ALT: 29 U/L (ref 0–35)
Albumin: 2.3 g/dL — ABNORMAL LOW (ref 3.5–5.2)
Alkaline Phosphatase: 140 U/L — ABNORMAL HIGH (ref 39–117)
Calcium: 9 mg/dL (ref 8.4–10.5)
GFR calc Af Amer: 63 mL/min — ABNORMAL LOW (ref >90)
Potassium: 4.2 mEq/L (ref 3.5–5.1)
Sodium: 133 mEq/L — ABNORMAL LOW (ref 135–145)
Total Protein: 6.8 g/dL (ref 6.0–8.3)

## 2011-05-28 LAB — GLUCOSE, CAPILLARY: Glucose-Capillary: 83 mg/dL (ref 70–99)

## 2011-05-28 LAB — DIFFERENTIAL
Band Neutrophils: 0 % (ref 0–10)
Basophils Absolute: 0 10*3/uL (ref 0.0–0.1)
Eosinophils Absolute: 0 10*3/uL (ref 0.0–0.7)
Lymphocytes Relative: 0 % — ABNORMAL LOW (ref 12–46)
Monocytes Relative: 0 % — ABNORMAL LOW (ref 3–12)

## 2011-05-28 LAB — TRIGLYCERIDES: Triglycerides: 96 mg/dL (ref <150)

## 2011-05-28 MED ORDER — ENOXAPARIN SODIUM 40 MG/0.4ML ~~LOC~~ SOLN
40.0000 mg | SUBCUTANEOUS | Status: DC
  Administered 2011-05-28: 40 mg via SUBCUTANEOUS
  Filled 2011-05-28: qty 0.4

## 2011-05-28 MED ORDER — CLINIMIX E/DEXTROSE (5/20) 5 % IV SOLN
INTRAVENOUS | Status: AC
  Administered 2011-05-28: 18:00:00 via INTRAVENOUS
  Filled 2011-05-28: qty 2000

## 2011-05-28 MED ORDER — PANTOPRAZOLE SODIUM 40 MG IV SOLR
40.0000 mg | Freq: Two times a day (BID) | INTRAVENOUS | Status: DC
  Administered 2011-05-28 – 2011-06-06 (×19): 40 mg via INTRAVENOUS
  Filled 2011-05-28 (×20): qty 40

## 2011-05-28 MED ORDER — SODIUM CHLORIDE 0.9 % IV SOLN
INTRAVENOUS | Status: DC
  Administered 2011-05-31 – 2011-06-01 (×2): via INTRAVENOUS
  Administered 2011-06-03: 1000 mL via INTRAVENOUS
  Administered 2011-06-04 – 2011-06-05 (×2): via INTRAVENOUS

## 2011-05-28 MED ORDER — VANCOMYCIN HCL 1000 MG IV SOLR
1250.0000 mg | Freq: Two times a day (BID) | INTRAVENOUS | Status: DC
  Administered 2011-05-28 – 2011-05-29 (×2): 1250 mg via INTRAVENOUS
  Filled 2011-05-28 (×4): qty 1250

## 2011-05-28 MED ORDER — ONDANSETRON 8 MG/NS 50 ML IVPB
8.0000 mg | Freq: Three times a day (TID) | INTRAVENOUS | Status: DC | PRN
  Administered 2011-05-28 – 2011-06-06 (×7): 8 mg via INTRAVENOUS
  Filled 2011-05-28 (×10): qty 8

## 2011-05-28 MED ORDER — SODIUM CHLORIDE 0.9 % IJ SOLN
10.0000 mL | INTRAMUSCULAR | Status: DC | PRN
  Administered 2011-05-29 – 2011-06-01 (×2): 10 mL

## 2011-05-28 NOTE — Progress Notes (Signed)
Bright red blood noted, draining from G tube. MD notified. New  Orders placed in Epic. Will continue to monitor.

## 2011-05-28 NOTE — Progress Notes (Signed)
PARENTERAL NUTRITION CONSULT NOTE - Follow up  Pharmacy Consult for TNA Indication: Hx of pSBO  No Known Allergies  Patient Measurements: Height: 5\' 6"  (167.6 cm) Weight: 200 lb 1.6 oz (90.765 kg) IBW/kg (Calculated) : 59.3  Adjusted Body Weight:   Vital Signs: Temp: 98.7 F (37.1 C) (05/16 0550) Temp src: Oral (05/16 0550) BP: 114/76 mmHg (05/16 0550) Pulse Rate: 107  (05/16 0550) Intake/Output from previous day: 05/15 0701 - 05/16 0700 In: 300 [IV Piggyback:300] Out: 900 [Urine:600; Drains:300]  Labs:  [facility] 05/28/11 0604 05/27/11 1230 05/27/11 0515  WBC 0.4* 0.5* 0.6*  HGB 8.5* 7.7* 8.3*  HCT 27.2* 24.4* 26.7*  PLT 249 252 283  APTT -- 32 --  INR -- 1.07 --     Basename 05/28/11 0604 05/27/11 1230 05/27/11 0515 05/25/11 0830  NA 133* 136 136 --  K 4.2 4.0 4.5 --  CL 97 100 100 --  CO2 26 28 26  --  GLUCOSE 88 79 87 --  BUN 19 27* 30* --  CREATININE 1.21* 1.06 0.93 --  LABCREA -- -- -- --  CREAT24HRUR -- -- -- --  CALCIUM 9.0 8.3* 9.0 --  MG 2.1 2.0 -- 2.1  PHOS 4.3 3.8 -- --  PROT 6.8 6.0 -- 6.2  ALBUMIN 2.3* 2.1* -- 3.2*  AST 30 32 -- 24  ALT 29 23 -- 20  ALKPHOS 140* 123* -- 138*  BILITOT 0.7 0.5 -- 0.5  BILIDIR -- -- -- --  IBILI -- -- -- --  PREALBUMIN -- -- -- --  TRIG -- -- -- --  CHOLHDL -- -- -- --  CHOL -- -- -- --   Estimated Creatinine Clearance: 68 ml/min (by C-G formula based on Cr of 1.21).    Basename 05/27/11 2341 05/27/11 1821 05/27/11 1328  GLUCAP 81 70 68*    Medical History: Past Medical History  Diagnosis Date  . Hypertension     SINCE FIRST PREGNANCY  . Asthma   . Constipation   . History of miscarriage   . Preeclampsia   . Postoperative ileus 07/2010  . Iron deficiency anemia     s/p transfusions [facility]  . Uterine cancer   . Bowel obstruction     Medications:  Scheduled:     . cloNIDine  0.1 mg Transdermal Weekly  . filgrastim (NEUPOGEN)  SQ  300 mcg Subcutaneous STAT  . filgrastim (NEUPOGEN)  SQ  300  mcg Subcutaneous Daily  . insulin aspart  0-9 Units Subcutaneous Q6H  . lactulose  30 g Per Tube TID  .  morphine injection  6 mg Intravenous Once  . piperacillin-tazobactam (ZOSYN)  IV  3.375 g Intravenous Q8H  . sodium chloride  3 mL Intravenous Q12H  . vancomycin  1,000 mg Intravenous Q8H  . DISCONTD: cloNIDine  0.2 mg Transdermal Weekly  . DISCONTD: Lactulose  30 mL Gastric Tube TID  . DISCONTD: vancomycin  1,000 mg Intravenous Once   Infusions:     . TPN (CLINIMIX) +/- additives     And  . fat emulsion     PRN: HYDROmorphone (DILAUDID) injection, lip balm, LORazepam, morphine, ondansetron (ZOFRAN) IV, ondansetron, promethazine, DISCONTD: morphine, DISCONTD: promethazine  Insulin Requirements in the past 24 hours:  CBGs: 68, 70, 81 Insulin: none  Current Nutrition:  TNA: Clinimix E 5/15 cyclic over 14 hours (50 ml/hr from 6pm-7pm, 150 ml/hr from 7pm-7am, and 50 ml/hr from 7am-8am.  Off from 8am until 6pm) Diet: NPO  Assessment:  41 yoF admit  with cyclic TNA, initially started inpatient on 4/25 for partial SBO (resolving per Xray 5/15)  TNA held 5/15 due to port being used for IV abx and not available for TNA.  PICC to be placed 516  Electrolytes:  Na low (cannot adjust in pre-mix TNA), SCr elevated, others currently wnl  Albumin low, 2.3 (5/16), AST/ALT wnl (5/16)  CBGs low  Currently, no mIVF or GI prophylaxis.  Nutritional Goals:   RD recs 5/15:  Kcal 1750-2050/day, Protein 90-105 g/day, Fluid 1.7-2L/day  Clinimix 5/20 at goal of + MWF only fat emulsion 20% 240 ml/day = average of 1896 kcal/day and 96 g protein/day  Plan:  At 1800 today, if PICC line placed:  Cyclic Clinimix E 5/20 1920 mL over 14 hours  Fat emulsion at 30ml/hr over 14 hours (MWF only due to ongoing shortage).  TNA to contain standard multivitamins and trace elements (MWF only due to ongoing shortage).  Decrease to sensitive SSI Q8h  TNA lab panels on Mondays & Thursdays.  F/u  daily.   Lynann Beaver PharmD, BCPS Pager [phone] 05/28/2011 7:53 AM

## 2011-05-28 NOTE — Progress Notes (Signed)
Patient ID: Krystal [name], female   DOB: 08/12/1970, 41 y.o.   MRN: [id number]  Assessment/Plan:   Principal Problem:   *Neutropenic fever  - secondary to immunosuppressed status secondary to malignancy and chemotherapy treatment  - per oncology next chemotherapy was supposed to be given 3 weeks from 05/20/2011  - continue broad empiric antibiotics with vancomycin and zosyn  - follow up blood cultures, urine culture results - no growth to date - Neupogen 300 mg x 1 dose given stat on admission and we will continue this dosage on daily basis per oncology  - appreciate oncology following   Active Problems:   Partial small bowel obstruction  - Treated conservatively with bowel rest and NG tube decompression of the stomach.  - A G-tube was placed by interventional radiology for symptom management on 05/14/2011.  - TNA per pharmacy for nutritional support   Endometrial ca / Ascites  - Status post treatment with cisplatin/Adriamycin (cycle 2 given 05/20/2011)  - patient continues to complain of abdominal distention  - abdominal x ray with findings of resolving but not yet completely resolved small bowel obstruction  - status post paracentesis on previous admissions (Paracentesis done on 05/12/2011 and 05/18/2011) - abdominal ultrasound with large volume ascites 05/28/2011; obtain IR consult for paracentesis today  Chronic constipation  - Continue bowel regimen as tolerated.   Inadequate oral intake  - TNA for better nutritional support   Hypertension  - BP 120/83   Anemia of chronic disease  - secondary to malignancy  - hemoglobin 8.5 today - we will continue to monitor CBC   Code Status: Full   Family Communication: Updated at bedside.   Disposition Plan: Home, when stable.   Medical Consultants:  Dr. Jama Flavors, Oncology   Other consultants:  Pharmacy - for TNA and antibiotics dosing Interventional radiology for paracentesis (05/28/2011)  Antibiotics:  Zosyn 05/27/2011--->   Vancomycin 05/27/11--->   Subjective: No events overnight.   Objective:  Vital signs in last 24 hours:  Filed Vitals:   05/27/11 2030 05/27/11 2220 05/28/11 0550 05/28/11 1422  BP:  126/87 114/76 120/83  Pulse: 97 94 107 110  Temp: 98.2 F (36.8 C) 98.2 F (36.8 C) 98.7 F (37.1 C) 99.8 F (37.7 C)  TempSrc: Oral Oral Oral Oral  Resp:  18 16 18   Height:      Weight:   90.765 kg (200 lb 1.6 oz)   SpO2: 97% 97% 96% 95%    Intake/Output from previous day:   Intake/Output Summary (Last 24 hours) at 05/28/11 1639 Last data filed at 05/28/11 1300  Gross per 24 hour  Intake    300 ml  Output   1550 ml  Net  -1250 ml    Physical Exam: General: Alert, awake, oriented x3, in no acute distress. HEENT: No bruits, no goiter. Moist mucous membranes, no scleral icterus, no conjunctival pallor. Heart: Regular rate and rhythm, S1/S2 +, no murmurs, rubs, gallops. Lungs: Clear to auscultation bilaterally. No wheezing, no rhonchi, no rales.  Abdomen: distended but non tender, positive bowel sounds. Extremities: No clubbing or cyanosis, no pitting edema,  positive pedal pulses. Neuro: Grossly nonfocal.  Lab Results:   Lab 05/28/11 0604 05/27/11 1230 05/27/11 0515 05/26/11 0431 05/26/11 0425  WBC 0.4* 0.5* 0.6* -- 3.0*  HGB 8.5* 7.7* 8.3* 9.2* 8.8*  HCT 27.2* 24.4* 26.7* 27.0* 28.6*  PLT 249 252 283 -- 378  MCV 84.2 84.7 85.3 -- 85.6    Lab 05/28/11 0604 05/27/11 1230  06-04-2011 0515 05/25/11 0830  NA 133* 136 136 136  K 4.2 4.0 4.5 5.0  CL 97 100 100 99  CO2 26 28 26 26   GLUCOSE 88 79 87 107*  BUN 19 27* 30* 35*  CREATININE 1.21* 1.06 0.93 0.99  CALCIUM 9.0 8.3* 9.0 9.1  MG 2.1 2.0 -- 2.1    Lab 06-04-11 1230  INR 1.07   Cardiac markers:  Lab 05/22/11 1015  TROPONINI <0.30    URINE CULTURE     Status: Normal   Collection Time   06-04-11  7:00 AM      Component Value Range Status Comment   Specimen Description URINE,   Final    Special Requests NONE   Final      Culture  Setup Time [id number]   Final    Colony Count 8,000 COLONIES/ML  Final    Culture INSIGNGROW  Final    Report Status 05/28/2011 FINAL   Final   CULTURE, BLOOD (ROUTINE X 2)     Status: Normal (Preliminary result)   Collection Time   2011/06/04  7:15 AM      Component Value Range Status Comment   Culture  Setup Time [id number]   Final    Culture     Final    Value:        BLOOD CULTURE RECEIVED NO GROWTH TO DATE    Report Status PENDING   Incomplete   CULTURE, BLOOD (ROUTINE X 2)     Status: Normal (Preliminary result)   Collection Time   Jun 04, 2011  9:25 AM      Component Value Range Status Comment   Culture  Setup Time [id number]   Final    Culture     Final    Value:        BLOOD CULTURE RECEIVED NO GROWTH TO DATE    Report Status PENDING   Incomplete     Studies/Results: Dg Chest 2 View 2011-06-04   IMPRESSION: Shallow inspiration with bilateral basilar atelectasis.  Cardiac enlargement.  No significant interval change.    US Abdomen Complete 05/28/2011  IMPRESSION:  1.  Gallbladder sludge but no gallstones or findings for acute cholecystitis. 2.  Normal caliber common bile duct. 3.  Diffuse decreased echogenicity of the liver may be due to passive congestion and/or hepatitis. 4.  Small hepatic lesions seen on prior CT scan are not well seen on ultrasound. 5.  Large volume ascites.     Medications: Scheduled Meds:   . cloNIDine  0.1 mg Transdermal Weekly  . filgrastim (NEUPOGEN)  SQ  300 mcg Subcutaneous Daily  . insulin aspart  0-9 Units Subcutaneous Q6H  . lactulose  30 g Per Tube TID  . pantoprazole (PROTONIX) IV  40 mg Intravenous Q12H  . piperacillin-tazobactam (ZOSYN)  IV  3.375 g Intravenous Q8H  . vancomycin  1,250 mg Intravenous Q12H   Continuous Infusions:   . sodium chloride 50 mL/hr at 05/28/11 0930  . TPN (CLINIMIX) +/- additives     And  . fat emulsion    . TPN (CLINIMIX) +/- additives     PRN Meds:.HYDROmorphone (DILAUDID) injection, lip  balm, LORazepam, morphine, ondansetron (ZOFRAN) IV, promethazine, DISCONTD: ondansetron (ZOFRAN) IV, DISCONTD: ondansetron   LOS: 1 day   Sherrill Buikema 05/28/2011, 4:39 PM  [facility] Pager: [phone]

## 2011-05-28 NOTE — Progress Notes (Signed)
Addendum: have added prophylactic lovenox.  Jama Flavors, MD [phone]

## 2011-05-28 NOTE — Progress Notes (Signed)
05/28/2011, 9:27 AM  Hospital day: 2 Antibiotics: vancomycin, zosyn day 2 Chemotherapy: day 9 cycle 2 adriamycin neupogen day 4    Subjective: Awake and alert, feeling some better this am. Very uncomfortable during night when G tube inadvertently left clamped. Denies pain now, last IV dilaudid 0530. No localizing symptoms of infection. TNA held last pm as PAC in use for antibiotics. Wants to walk today, needs mask when out of room. Is worried about mother, who is getting very little sleep as she is so attentive to patient. Mother at bedside now.  Objective: Vital signs in last 24 hours: Blood pressure 114/76, pulse 107, temperature 98.7 F (37.1 C), temperature source Oral, resp. rate 16, height 5\' 6"  (1.676 m), weight 200 lb 1.6 oz (90.765 kg), last menstrual period 07/02/2010, SpO2 96.00%. No maintenance IVF and I have written NS at 50 now  Intake/Output from previous day: 05/15 0701 - 05/16 0700 In: 300 [IV Piggyback:300] Out: 900 [Urine:600; Drains:300] Intake/Output this shift:    Physical exam: awake and alert, looks fairly comfortable now supine on RA.Marland Kitchen PERRL, not icteric. Mouth without lesions. Cor RRR. Lungs without wheezes or crackles. PAC site fine. Abdomen distended, not quite tight, quiet. Cannister nearly full of dark green liquid from G tube. LE no edema, cords. Moves easily in bed.  Lab Results:  Basename 05/28/11 0604 05/27/11 1230  WBC 0.4* 0.5*  HGB 8.5* 7.7*  HCT 27.2* 24.4*  PLT 249 252   BMET  Basename 05/28/11 0604 05/27/11 1230  NA 133* 136  K 4.2 4.0  CL 97 100  CO2 26 28  GLUCOSE 88 79  BUN 19 27*  CREATININE 1.21* 1.06  CALCIUM 9.0 8.3*  Pharmacist's note re TNA seen, added IV NS  No positive blood or urine cultures this am Studies/Results: Dg Chest 2 View  05/27/2011  *RADIOLOGY REPORT*  Clinical Data: Nausea and vomiting.  Mid abdominal pain.  Fever and cough.  CHEST - 2 VIEW  Comparison: 05/10/2013chest radiograph from [facility].  Findings: Shallow inspiration with bilateral basilar atelectasis similar to previous study.  Cardiac enlargement with normal pulmonary vascularity.  Power port type central venous catheter with tip in the low SVC.  No pneumothorax.  No blunting of costophrenic angles.  Residual contrast material in the colon. Gastrostomy tube.  IMPRESSION: Shallow inspiration with bilateral basilar atelectasis.  Cardiac enlargement.  No significant interval change.  Original Report Authenticated By: Marlon Pel, M.D.   Dg Abd Acute W/chest  05/26/2011  *RADIOLOGY REPORT*  Clinical Data: Nausea and vomiting with abdominal pain and distention.  Hypertension.  Uterine cancer.  ACUTE ABDOMEN SERIES (ABDOMEN 2 VIEW & CHEST 1 VIEW)  Comparison: 05/22/2011.  Findings: Low lung volumes.  Bibasilar atelectasis.  No infiltrates or failure. Normal heart size.  No significant pleural effusion. Port-A-Cath good position.  Residual barium within the colon.  Slight prominence mid abdominal small bowel loops  slightly improved, consistent with resolving small bowel obstruction.  Less barium in the colon.  Slight air- fluid levels.  IMPRESSION: Resolving but not yet cleared small bowel obstruction.  Bibasilar atelectasis.  Original Report Authenticated By: Elsie Stain, M.D.   She may need another US paracentesis in next few days, however I would prefer to wait until WBC higher as long as not markedly symptomatic.  Assessment/Plan: 1.Neutropenic fever post chemotherapy given in palliative attempt for aggressive endometrial carcinoma: continue neupogen and neutropenic precautions, empiric IV broad spectrum antibiotics. Follow cultures pending. 2.bowel obstruction: G  tube drainage, TNA when IV access possible. Not surgical candidate. 3.PAC in 4.life-long severe constipation probably adding to bowel problems. Continuing lactulose per G tube. 5.Per my discussion with patient, she does not want to be maintained on life  support in irreversible situation. 6.increase in creatinine: follow with increased IVF.  When telemetry can be DCd, would be reasonable to move to oncology unit. Will follow with you.  Alisyn Lequire P  [phone]

## 2011-05-28 NOTE — Progress Notes (Signed)
   CARE MANAGEMENT NOTE 05/28/2011  Patient:  Krystal [name], Krystal [name]   Account Number:  [id number]  Date Initiated:  05/28/2011  Documentation initiated by:  Jiles Crocker  Subjective/Objective Assessment:   ADMITTED WITH FEVER, endometrial carcinoma on chemotherapy , SBO     Action/Plan:   PCP:  [name], MD; LIVES AT HOME WITH FAMILY; RECEIVING HHC WITH [facility]/ HOME TNA   Anticipated DC Date:  06/04/2011   Anticipated DC Plan:  HOME W HOME HEALTH SERVICES      DC Planning Services  CM consult               Status of service:  In process, will continue to follow Medicare Important Message given?  NA - LOS <3 / Initial given by admissions (If response is "NO", the following Medicare IM given date fields will be blank)  Per UR Regulation:  Reviewed for med. necessity/level of care/duration of stay  Comments:  05/28/2011- B Estiven Kohan RN, BSN, MHA

## 2011-05-28 NOTE — Evaluation (Signed)
Physical Therapy Evaluation Patient Details Name: Krystal [name] MRN: [id number] DOB: May 17, 1970 Today's Date: 05/28/2011 Time: 7829-5621 PT Time Calculation (min): 18 min  PT Assessment / Plan / Recommendation Clinical Impression  Patient s/p endometrial CA with complications with decr strength and endurance.  Will benefit from PT to address strength and endurance issues.  Continue PT.      PT Assessment  Patient needs continued PT services    Follow Up Recommendations  Home health PT;Supervision/Assistance - 24 hour (with Hospice)    Barriers to Discharge  None      lEquipment Recommendations  Rolling walker with 5" wheels;3 in 1 bedside comode;Wheelchair (measurements);Wheelchair cushion (measurements);Hospital bed (Bedside table)    Recommendations for Other Services   None  Frequency Min 3X/week    Precautions / Restrictions Precautions Precautions: Fall Restrictions Weight Bearing Restrictions: No   Pertinent Vitals/Pain VSS/ No pain      Mobility  Bed Mobility Bed Mobility: Rolling Right;Right Sidelying to Sit;Sitting - Scoot to [place] of Bed Rolling Right: 4: Min assist;With rail Right Sidelying to Sit: 4: Min assist;With rails;HOB elevated (20 degrees) Sitting - Scoot to Edge of Bed: 4: Min assist Details for Bed Mobility Assistance: Cues and slight facilitation for trunk elevation Transfers Transfers: Sit to Stand;Stand to Sit;Stand Pivot Transfers Sit to Stand: 1: +2 Total assist;From elevated surface;With upper extremity assist;From bed Sit to Stand: Patient Percentage: 70% Stand to Sit: 1: +2 Total assist;With upper extremity assist;With armrests;To chair/3-in-1 Stand to Sit: Patient Percentage: 70% Stand Pivot Transfers: 1: +2 Total assist Stand Pivot Transfers: Patient Percentage: 70% Details for Transfer Assistance: Patient needed cues for hand placement and for initiation of movement. Ambulation/Gait Ambulation/Gait Assistance: 1: +2 Total  assist Ambulation/Gait: Patient Percentage: 70% Ambulation Distance (Feet): 3 Feet Assistive device: Rolling walker Ambulation/Gait Assistance Details: Patient took 3 steps to chair with RW with good technique and safety.  Not very unsteady.   Gait Pattern: Shuffle Stairs: No Wheelchair Mobility Wheelchair Mobility: No         PT Diagnosis: Difficulty walking  PT Problem List: Decreased strength;Decreased activity tolerance;Decreased balance;Decreased mobility;Decreased safety awareness;Decreased knowledge of use of DME PT Treatment Interventions: DME instruction;Gait training;Stair training;Functional mobility training;Therapeutic activities;Therapeutic exercise;Balance training;Patient/family education;Wheelchair mobility training   PT Goals Acute Rehab PT Goals PT Goal Formulation: With patient Time For Goal Achievement: 06/11/11 Potential to Achieve Goals: Good Pt will go Supine/Side to Sit: with supervision PT Goal: Supine/Side to Sit - Progress: Goal set today Pt will go Sit to Stand: with supervision PT Goal: Sit to Stand - Progress: Goal set today Pt will Transfer Bed to Chair/Chair to Bed: with supervision PT Transfer Goal: Bed to Chair/Chair to Bed - Progress: Goal set today Pt will Ambulate: 16 - 50 feet;with supervision;with least restrictive assistive device PT Goal: Ambulate - Progress: Goal set today Pt will Perform Home Exercise Program: Independently PT Goal: Perform Home Exercise Program - Progress: Goal set today  Visit Information  Last PT Received On: 05/28/11 Assistance Needed: +2    Subjective Data  Subjective: "I feel very tired." Patient Stated Goal: To go home with family   Prior Functioning  Home Living Lives With: Family;Significant other (Mom can assist and has 3 young children) Available Help at Discharge: Family;Available 24 hours/day Type of Home: House Home Access: Stairs to enter [employer] of Steps: 2-3 Entrance  Stairs-Rails: Right;Left;Can reach both Home Layout: One level Bathroom Shower/Tub: Tub/shower unit (Sponge bathes per patient) Bathroom Toilet: Standard Bathroom Accessibility: Yes  How Accessible: Accessible via wheelchair;Accessible via walker Home Adaptive Equipment: None Additional Comments: Patient reports that family held onto her when she walked prior to admit for some time now.   Prior Function Level of Independence: Needs assistance Needs Assistance: Bathing;Dressing;Gait;Grooming;Toileting;Meal Prep;Light Housekeeping Bath: Moderate Dressing: Moderate Grooming: Moderate Toileting: Moderate Meal Prep: Total Light Housekeeping: Total Able to Take Stairs?: Yes (with help) Driving: No Vocation:  (N/A) Communication Communication: No difficulties (Speaks [place] and [place])    Cognition  Overall Cognitive Status: Appears within functional limits for tasks assessed/performed Arousal/Alertness: Awake/alert Orientation Level: Appears intact for tasks assessed Behavior During Session: [facility] for tasks performed    Extremity/Trunk Assessment Right Upper Extremity Assessment RUE ROM/Strength/Tone: Deficits RUE ROM/Strength/Tone Deficits: grossly 3/5 RUE Sensation: WFL - Light Touch RUE Coordination: WFL - gross/fine motor Left Upper Extremity Assessment LUE ROM/Strength/Tone: Deficits LUE ROM/Strength/Tone Deficits: grossly 3/5  LUE Sensation: WFL - Light Touch LUE Coordination: WFL - gross/fine motor Right Lower Extremity Assessment RLE ROM/Strength/Tone: Deficits RLE ROM/Strength/Tone Deficits: grossly 3/5  RLE Sensation: WFL - Light Touch RLE Coordination: WFL - gross/fine motor Left Lower Extremity Assessment LLE ROM/Strength/Tone: Deficits LLE ROM/Strength/Tone Deficits: grossly 3/5  LLE Sensation: WFL - Light Touch LLE Coordination: WFL - gross/fine motor Trunk Assessment Trunk Assessment: Normal   Balance Balance Balance Assessed: No  End of Session PT - End of  Session Equipment Utilized During Treatment: Gait belt Activity Tolerance: Patient limited by fatigue Patient left: in chair;with call bell/phone within reach;with family/visitor present Nurse Communication: Mobility status   INGOLD,Gwendalynn Eckstrom 05/28/2011, 12:02 PM  Hensley Treat [place],PT Acute Rehabilitation [phone] (pager)

## 2011-05-29 ENCOUNTER — Inpatient Hospital Stay (HOSPITAL_COMMUNITY): Payer: Medicaid Other

## 2011-05-29 ENCOUNTER — Other Ambulatory Visit: Payer: Self-pay | Admitting: Lab

## 2011-05-29 ENCOUNTER — Ambulatory Visit: Payer: Self-pay | Admitting: Physician Assistant

## 2011-05-29 ENCOUNTER — Other Ambulatory Visit: Payer: Self-pay

## 2011-05-29 DIAGNOSIS — R188 Other ascites: Secondary | ICD-10-CM

## 2011-05-29 DIAGNOSIS — R1033 Periumbilical pain: Secondary | ICD-10-CM

## 2011-05-29 DIAGNOSIS — R509 Fever, unspecified: Secondary | ICD-10-CM

## 2011-05-29 DIAGNOSIS — N8502 Endometrial intraepithelial neoplasia [EIN]: Secondary | ICD-10-CM

## 2011-05-29 LAB — CBC
HCT: 26 % — ABNORMAL LOW (ref 36.0–46.0)
MCH: 26.3 pg (ref 26.0–34.0)
MCHC: 30.8 g/dL (ref 30.0–36.0)
MCV: 85.5 fL (ref 78.0–100.0)
RDW: 15.7 % — ABNORMAL HIGH (ref 11.5–15.5)

## 2011-05-29 LAB — GLUCOSE, CAPILLARY
Glucose-Capillary: 94 mg/dL (ref 70–99)
Glucose-Capillary: 128 mg/dL — ABNORMAL HIGH (ref 70–99)
Glucose-Capillary: 103 mg/dL — ABNORMAL HIGH (ref 70–99)
Glucose-Capillary: 111 mg/dL — ABNORMAL HIGH (ref 70–99)

## 2011-05-29 LAB — BASIC METABOLIC PANEL
BUN: 27 mg/dL — ABNORMAL HIGH (ref 6–23)
Calcium: 8.8 mg/dL (ref 8.4–10.5)
Chloride: 100 mEq/L (ref 96–112)
Creatinine, Ser: 1.59 mg/dL — ABNORMAL HIGH (ref 0.50–1.10)
GFR calc Af Amer: 45 mL/min — ABNORMAL LOW (ref >90)
GFR calc non Af Amer: 39 mL/min — ABNORMAL LOW (ref >90)

## 2011-05-29 MED ORDER — LIDOCAINE VISCOUS 2 % MT SOLN
20.0000 mL | OROMUCOSAL | Status: DC | PRN
  Administered 2011-05-30 – 2011-05-31 (×3): 20 mL via OROMUCOSAL
  Filled 2011-05-29 (×2): qty 20

## 2011-05-29 MED ORDER — DEXTROSE 5 % IV SOLN: 1.0000 g | Freq: Three times a day (TID) | INTRAVENOUS | Status: DC

## 2011-05-29 MED ORDER — FAT EMULSION 20 % IV EMUL
240.0000 mL | INTRAVENOUS | Status: AC
  Filled 2011-05-29 (×2): qty 250

## 2011-05-29 MED ORDER — TRACE MINERALS CR-CU-MN-SE-ZN 10-1000-500-60 MCG/ML IV SOLN
INTRAVENOUS | Status: AC
  Administered 2011-05-29: 18:00:00 via INTRAVENOUS
  Filled 2011-05-29: qty 2000

## 2011-05-29 MED ORDER — LEVOFLOXACIN IN D5W 750 MG/150ML IV SOLN
750.0000 mg | INTRAVENOUS | Status: AC
Start: 2011-05-29 — End: 2011-05-31
  Administered 2011-05-29 – 2011-05-31 (×3): 750 mg via INTRAVENOUS
  Filled 2011-05-29 (×3): qty 150

## 2011-05-29 MED ORDER — VANCOMYCIN HCL 1000 MG IV SOLR
750.0000 mg | Freq: Two times a day (BID) | INTRAVENOUS | Status: DC
  Administered 2011-05-30 – 2011-06-05 (×12): 750 mg via INTRAVENOUS
  Filled 2011-05-29 (×14): qty 750

## 2011-05-29 MED ORDER — ACETAMINOPHEN 160 MG/5ML PO SOLN
500.0000 mg | Freq: Once | ORAL | Status: AC
  Administered 2011-05-29: 500 mg
  Filled 2011-05-29: qty 20.3

## 2011-05-29 MED ORDER — INSULIN ASPART 100 UNIT/ML ~~LOC~~ SOLN
0.0000 [IU] | Freq: Three times a day (TID) | SUBCUTANEOUS | Status: DC
  Administered 2011-05-30: 2 [IU] via SUBCUTANEOUS
  Administered 2011-05-31: 1 [IU] via SUBCUTANEOUS
  Administered 2011-05-31: 2 [IU] via SUBCUTANEOUS
  Administered 2011-06-01: 1 [IU] via SUBCUTANEOUS
  Administered 2011-06-01: 2 [IU] via SUBCUTANEOUS
  Administered 2011-06-02: 1 [IU] via SUBCUTANEOUS
  Administered 2011-06-02 – 2011-06-04 (×5): 2 [IU] via SUBCUTANEOUS
  Administered 2011-06-05: 1 [IU] via SUBCUTANEOUS

## 2011-05-29 MED ORDER — METRONIDAZOLE IN NACL 5-0.79 MG/ML-% IV SOLN
500.0000 mg | Freq: Three times a day (TID) | INTRAVENOUS | Status: DC
  Administered 2011-05-29: 500 mg via INTRAVENOUS
  Filled 2011-05-29 (×2): qty 100

## 2011-05-29 MED ORDER — FILGRASTIM 480 MCG/1.6ML IJ SOLN
480.0000 ug | Freq: Every day | INTRAMUSCULAR | Status: DC
  Administered 2011-05-29 – 2011-05-31 (×3): 480 ug via SUBCUTANEOUS
  Filled 2011-05-29 (×5): qty 1.6

## 2011-05-29 MED ORDER — ACETAMINOPHEN 160 MG/5ML PO SOLN
325.0000 mg | ORAL | Status: DC | PRN
  Administered 2011-05-29 – 2011-06-02 (×6): 325 mg via ORAL
  Filled 2011-05-29 (×8): qty 20.3

## 2011-05-29 MED ORDER — HYDROMORPHONE HCL PF 1 MG/ML IJ SOLN
1.0000 mg | INTRAMUSCULAR | Status: DC | PRN
  Administered 2011-05-29 – 2011-06-03 (×9): 1 mg via INTRAVENOUS
  Filled 2011-05-29 (×10): qty 1

## 2011-05-29 NOTE — Progress Notes (Signed)
Temp 101.9 this am.  Pt c/o increasing abdominal pressure.  PEG tube to LIS, patent, flushes without resistance, has drained 225cc greenish fluid since 23:00.  Dr Adela Glimpse made aware of findings; new orders received and implemented.

## 2011-05-29 NOTE — Progress Notes (Signed)
05/29/2011, 4:42 PM  Hospital day: 3 Antibiotics: day 2 vanc and zosyn; day 1 levaquin Chemotherapy: day 10 cycle 2 adriamycin Neupogen day 5    Subjective: Patient seen this am and again now. Has just been moved to oncology unit, no family in room presently. Much more comfortable since paracentesis just done by IR, per patient for 3 liters tho note not available yet. Requesting IV dilaudid now; this may need to increase or go to qtt if pain not controlled. Temperatures down since Tmax 101.9 this AM.  Objective: Vital signs in last 24 hours: Blood pressure 128/89, pulse 113, temperature 98.7 F (37.1 C), temperature source Oral, resp. rate 18, height 5\' 6"  (1.676 m), weight 201 lb 11.5 oz (91.5 kg), last menstrual period 07/02/2010, SpO2 95.00%. Respirations not labored supine on RA. IV NS at 50.  Intake/Output from previous day: 05/16 0701 - 05/17 0700 In: 2284.2 [I.V.:975; IV Piggyback:700; TPN:519.2] Out: 1775 [Urine:700; Drains:1075] Intake/Output this shift: Total I/O In: 1575 [IV Piggyback:100; TPN:1475] Out: 100 [Drains:100]  Physical exam: Rouses easily to voice, oriented and appropriate. Mouth not dry, ulcer on left lateral tongue, no thrush. Lungs without wheezes or rales. PAC site unremarkable. New PICC RUE site ok, no bleeding. G tube dressing dry. Cor RRR. Abdomen much softer and less distended now than this am, less tender. LE no edema, cords. Moves all extremities easily.   Lab Results:  Basename 05/29/11 0420 05/28/11 0604  WBC 0.4* 0.4*  HGB 8.0* 8.5*  HCT 26.0* 27.2*  PLT 163 249   BMET  Basename 05/29/11 0420 05/28/11 0604  NA 137 133*  K 3.9 4.2  CL 100 97  CO2 25 26  GLUCOSE 161* 88  BUN 27* 19  CREATININE 1.59* 1.21*  CALCIUM 8.8 9.0   2 blood cultures from 05-27-11 negative to date, rest pending  Studies/Results: US Abdomen Complete  05/28/2011  *RADIOLOGY REPORT*  Clinical Data:  Ascites.  Elevated liver function studies.  COMPLETE  ABDOMINAL ULTRASOUND  Comparison:  CT scan 05/18/2011.  Findings:  Gallbladder:  No gallstones, gallbladder wall thickening, or pericholecystic fluid.Gallbladder sludge is noted.  Common bile duct:  Normal in caliber measuring a maximum of 5.60mm.  Liver:  Diffuse decreased echogenicity of the liver could be secondary to edema from passive congestion or hepatitis.  No focal mass lesion or intrahepatic biliary dilatation.  The small lesion seen on the previous CT are not well visualized on ultrasound.  IVC:  Not well visualized.  Pancreas:  Not visualized.  Spleen:  Normal size and echogenicity without focal lesions.  Right Kidney:  11.9 cm in length. Normal renal cortical thickness and echogenicity without focal lesions or hydronephrosis.  Left Kidney:  12.3 cm in length.  Mild hydronephrosis.  Normal renal cortical thickness and echogenicity.  Abdominal aorta:  Not visualized.  Additional findings:  Large volume ascites.  Prior CT scan showed extensive omental tumor and ascites.  IMPRESSION:  1.  Gallbladder sludge but no gallstones or findings for acute cholecystitis. 2.  Normal caliber common bile duct. 3.  Diffuse decreased echogenicity of the liver may be due to passive congestion and/or hepatitis. 4.  Small hepatic lesions seen on prior CT scan are not well seen on ultrasound. 5.  Large volume ascites.  Original Report Authenticated By: P. Loralie Champagne, M.D.   Dg Chest Port 1v Same Day  05/29/2011  *RADIOLOGY REPORT*  Clinical Data: 41 year old female with neutropenic fever. Endometrial carcinoma.  PORTABLE CHEST - 1 VIEW SAME DAY  Comparison: 05/27/2011 and earlier.  Findings: Portable semi upright AP view of the chest at 0909 hours. Continued low lung volumes.  Stable right chest Port-A-Cath.  Right PICC line now in place.  Increased retrocardiac opacity.  Left of the plate like atelectasis at both lung bases.  Stable cardiac size and mediastinal contours.  Visualized tracheal air column is within normal  limits.  No pneumothorax, pulmonary edema or large effusion.  IMPRESSION: Increased retrocardiac opacity could reflect worsening basilar atelectasis or infection/pneumonia.  Original Report Authenticated By: Harley Hallmark, M.D.     Assessment/Plan: 1.Metastatic high grade endometrial carcinoma: IIIC at optimal debulking June 2012 (7 months after full term pregnancy), 6 cycles taxol/carboplatin given with RT thru mid Jan 2013, metastatic disease by March 2013, 1 cycle of CDDP/adriamycin 04-27-11 then bowel obstruction with hospitalization 4-17 thru 05-21-11.  She had multiple consultants during that admission, is not a surgical candidate, had single agent adriamycin 05-20-11 then DC home with G tube suction and TNA supplied by [facility]. Neupogen was begun, however she was readmitted 05-28-11 with neutropenic fever. Still neutropenic and higher fever this am. Continuing all supportive interventions short of resuscitation/ life support. Patient understands that she may not survive with infection and neutropenia, but appreciates efforts to give her a little more time with her children as her mother assumes their care. 2.DNR per my discussion again this am with patient 3.PAC, PICC, G tube in 4.difficult social situation: 3 very young children, not [place] citizen/ no insurance, mother & sister here from [place] and speak [place].  My partners will follow thru weekend.   Asser Lucena P

## 2011-05-29 NOTE — Progress Notes (Addendum)
Patient ID: Krystal [name], female   DOB: [birth year], 41 y.o.   MRN: [id number]  Interim Summary: 41 year old female with history of metastatic endometrial carcinoma, cycle 2 adriamycin (single agent),  given 05/20/2011, recent  lengthy hospitalization for bowel obstruction (requireing G tube intermittent suction) related to the recurrent cancer. Patient admitted 05/27/2011 with working diagnosis of neutropenic fever.  Consults to date: 1. Oncology (dr. Darrold Span) 2. Interventional radiology - for paracentesis 3. Physical Therapy  Assessment/Plan:   Principal Problem:   *Neutropenic fever  - secondary to immunosuppressed status secondary to possible spontaneous bacterial peritonitis secondary to multiple lines patient requires (G tube, TNA) - patient will have paracentesis today 05/29/2011 - blood culture x 2 sets from 05/27/2011 negative to date; urine culture 05/27/2011 negative to date - patient spiked fever Tmax 101.9 F in last 12 hours; follow up blood culture results on 05/29/2011; CXR done 05/29/2011 shows Increased retrocardiac opacity could reflect worsening basilar atelectasis or infection/pneumonia.  Antibiotic coverage at this time includes vancomycin and zosyn. Patient is status post recent lengthy hositalization so risk is for healthcare associated pneumonia for which reason we will add Levaquin to her regimen. - Neupogen 300 mg x 1 dose given stat on admission and we continued this dosage on daily basis per oncology  - WBC remain at 0.4 - appreciate oncology following   Active Problems:   Partial small bowel obstruction  - Treated conservatively with bowel rest and intermittent G tube suction - A G-tube was placed by interventional radiology for symptom management on 05/14/2011.  - TNA per pharmacy for nutritional support   Endometrial ca / Ascites  - Status post treatment with cisplatin/Adriamycin (cycle 2 given 05/20/2011)  - patient continues to complain of abdominal distention  -  abdominal x ray with findings of resolving but not yet completely resolved small bowel obstruction  - status post paracentesis on previous admissions (Paracentesis done on 05/12/2011 and 05/18/2011)  - abdominal ultrasound with large volume ascites 05/28/2011 - paracentesis to be done 05/29/2011  Acute kidney injury - creatinine slowly trending up from 1.06 on admission and today 1.59 - possibly due to ascites/? Hepatorenal syndrome - continue to monitor renal function  Inadequate oral intake  - TNA for better nutritional support   Hypertension  - BP 126/86 - well controlled  Anemia of chronic disease  - secondary to malignancy  - hemoglobin 8.0 today  - we will continue to monitor CBC   Code Status:  - DNR  Family Communication:  - Updated at bedside.  - we had an excellent praying session today - while patient agrees she would not want to be intubated or have resuscitative measures done in case of cardiac arrest she does want other possible interventions which includes chemotherapy or whatever can prolong her time living so she have ore time with her kids.  Disposition Plan:  - Home with home health PT  Antibiotics:  Zosyn 05/27/2011 --->  Vancomycin 05/27/11 ---> Levaquin 05/29/2011 -->   Subjective: No events overnight. Patient continues to experience abdominal pain, she reports it feels very distended but less nauseated.  Objective:  Vital signs in last 24 hours:   05/28/11 0550 05/28/11 1422 05/28/11 2155 05/29/11 0551  BP: 114/76 120/83 122/88 126/86  Pulse: 107 110 114 120  Temp: 98.7 F (37.1 C) 99.8 F (37.7 C) 100.9 F (38.3 C) 101.9 F (38.8 C)  TempSrc: Oral Oral Oral Oral  Resp: 16 18 18 20   SpO2: 96% 95% 97%  98%    Intake/Output from previous day:   Gross per 24 hour  Intake 2034.17 ml  Output    775 ml  Net 1259.17 ml    Physical Exam: General: Alert, awake, oriented x3, in no acute distress. HEENT: No bruits, no goiter. Moist mucous  membranes, no scleral icterus, no conjunctival pallor. Heart: Regular rate and rhythm, S1/S2 +, no murmurs, rubs, gallops. Lungs: Clear to auscultation bilaterally. No wheezing, no rhonchi, no rales.  Abdomen: distended, tender to palpation but without rebound tenderness or guarding. Extremities: No clubbing or cyanosis, no pitting edema,  positive pedal pulses. Neuro: Grossly nonfocal.  Lab Results:  Lab 05/29/11 0420 05/28/11 0604 05/27/11 1230 05/27/11 0515  WBC 0.4* 0.4* 0.5* 0.6*  HGB 8.0* 8.5* 7.7* 8.3*  HCT 26.0* 27.2* 24.4* 26.7*  PLT 163 249 252 283  MCV 85.5 84.2 84.7 85.3    Lab 05/29/11 0420 05/28/11 0604 05/27/11 1230 05/27/11 0515  NA 137 133* 136 136  K 3.9 4.2 4.0 4.5  CL 100 97 100 100  CO2 25 26 28 26   GLUCOSE 161* 88 79 87  BUN 27* 19 27* 30*  CREATININE 1.59* 1.21* 1.06 0.93  CALCIUM 8.8 9.0 8.3* 9.0    Lab 05/27/11 1230  INR 1.07    URINE CULTURE     Status: Normal   Collection Time   05/27/11  7:00 AM      Component Value Range Status Comment   Specimen Description URINE,    Final    Special Requests NONE   Final    Culture  Setup Time [id number]   Final    Colony Count 8,000 COLONIES/ML  Final    Culture INSIGNIFICANT GROWTH   Final    Report Status 05/28/2011 FINAL   Final   CULTURE, BLOOD (ROUTINE X 2)     Status: Normal (Preliminary result)   Collection Time   05/27/11  7:15 AM      Component Value Range Status Comment   Culture  Setup Time [id number]   Final    Culture     Final    Value:        BLOOD CULTURE RECEIVED NO GROWTH TO DATE    Report Status PENDING   Incomplete   CULTURE, BLOOD (ROUTINE X 2)     Status: Normal (Preliminary result)   Collection Time   05/27/11  9:25 AM      Component Value Range Status Comment   Culture  Setup Time [id number]   Final    Culture     Final    Value:        BLOOD CULTURE RECEIVED NO GROWTH TO DATE    Report Status PENDING   Incomplete     Studies/Results: US Abdomen Complete 05/28/2011     IMPRESSION:  1.  Gallbladder sludge but no gallstones or findings for acute cholecystitis. 2.  Normal caliber common bile duct. 3.  Diffuse decreased echogenicity of the liver may be due to passive congestion and/or hepatitis. 4.  Small hepatic lesions seen on prior CT scan are not well seen on ultrasound. 5.  Large volume ascites.   Dg Chest Port 1v Same Day 05/29/2011    IMPRESSION: Increased retrocardiac opacity could reflect worsening basilar atelectasis or infection/pneumonia.    Medications: Scheduled Meds:   . cloNIDine  0.1 mg Transdermal Weekly  . filgrastim  480 mcg Subcutaneous Daily  . insulin aspart  0-9 Units Subcutaneous TID  . lactulose  30  g Per Tube TID  . metronidazole  500 mg Intravenous Q8H  . pantoprazole (PROTONIX) IV  40 mg Intravenous Q12H  . piperacillin-tazobactam (ZOSYN)  IV  3.375 g Intravenous Q8H  . vancomycin  750 mg Intravenous Q12H   Continuous Infusions:   . sodium chloride 50 mL/hr at 05/28/11 0930  . TPN (CLINIMIX) +/- additives     And  . fat emulsion    . TPN (CLINIMIX) +/- additives 150 mL/hr at 05/28/11 2215   PRN Meds:.acetaminophen (TYLENOL) oral liquid 160 mg/5 mL, HYDROmorphone (DILAUDID) injection, lip balm, LORazepam, morphine, ondansetron (ZOFRAN) IV, promethazine, sodium chloride, DISCONTD:  HYDROmorphone (DILAUDID) injection   LOS: 2 days   Oluwatobiloba Martin 05/29/2011, 1:26 PM  [facility] Pager: [phone]

## 2011-05-29 NOTE — Progress Notes (Signed)
PARENTERAL NUTRITION CONSULT NOTE - Follow up  Pharmacy Consult for TNA Indication: Hx of pSBO  No Known Allergies  Patient Measurements: Height: 5\' 6"  (167.6 cm) Weight: 201 lb 11.5 oz (91.5 kg) IBW/kg (Calculated) : 59.3  Adjusted Body Weight:   Vital Signs: Temp: 101.9 F (38.8 C) (05/17 0551) Temp src: Oral (05/17 0551) BP: 126/86 mmHg (05/17 0551) Pulse Rate: 120  (05/17 0551) Intake/Output from previous day: 05/16 0701 - 05/17 0700 In: 2284.2 [I.V.:975; IV Piggyback:700; TPN:519.2] Out: 1775 [Urine:700; Drains:1075]  Labs:  [facility] 05/29/11 0420 05/28/11 0604 05/27/11 1230  WBC 0.4* 0.4* 0.5*  HGB 8.0* 8.5* 7.7*  HCT 26.0* 27.2* 24.4*  PLT 163 249 252  APTT -- -- 32  INR -- -- 1.07     Basename 05/29/11 0420 05/28/11 0604 05/27/11 1230  NA 137 133* 136  K 3.9 4.2 4.0  CL 100 97 100  CO2 25 26 28   GLUCOSE 161* 88 79  BUN 27* 19 27*  CREATININE 1.59* 1.21* 1.06  LABCREA -- -- --  CREAT24HRUR -- -- --  CALCIUM 8.8 9.0 8.3*  MG -- 2.1 2.0  PHOS -- 4.3 3.8  PROT -- 6.8 6.0  ALBUMIN -- 2.3* 2.1*  AST -- 30 32  ALT -- 29 23  ALKPHOS -- 140* 123*  BILITOT -- 0.7 0.5  BILIDIR -- -- --  IBILI -- -- --  PREALBUMIN -- 14.5* --  TRIG -- 96 --  CHOLHDL -- -- --  CHOL -- 198 --   Estimated Creatinine Clearance: 52 ml/min (by C-G formula based on Cr of 1.59).    Basename 05/28/11 0542 05/27/11 2341 05/27/11 1821  GLUCAP 83 81 70    Medical History: Past Medical History  Diagnosis Date  . Hypertension     SINCE FIRST PREGNANCY  . Asthma   . Constipation   . History of miscarriage   . Preeclampsia   . Postoperative ileus 07/2010  . Iron deficiency anemia     s/p transfusions [facility]  . Uterine cancer   . Bowel obstruction     Medications:  Scheduled:     . acetaminophen (TYLENOL) oral liquid 160 mg/5 mL  500 mg Per Tube Once  . cloNIDine  0.1 mg Transdermal Weekly  . filgrastim (NEUPOGEN)  SQ  300 mcg Subcutaneous Daily  . insulin aspart   0-9 Units Subcutaneous Q6H  . lactulose  30 g Per Tube TID  . pantoprazole (PROTONIX) IV  40 mg Intravenous Q12H  . piperacillin-tazobactam (ZOSYN)  IV  3.375 g Intravenous Q8H  . sodium chloride  3 mL Intravenous Q12H  . vancomycin  1,250 mg Intravenous Q12H  . DISCONTD: enoxaparin  40 mg Subcutaneous Q24H  . DISCONTD: vancomycin  1,000 mg Intravenous Q8H   Infusions:     . sodium chloride 50 mL/hr at 05/28/11 0930  . TPN (CLINIMIX) +/- additives 150 mL/hr at 05/28/11 2215   Insulin Requirements in the past 24 hours:  CBGs: 83 Insulin: 2 units  Current Nutrition:  TNA: Clinimix E 5/15 cyclic over 14 hours (50 ml/hr from 6pm-7pm, 150 ml/hr from 7pm-7am, and 50 ml/hr from 7am-8am.  Off from 8am until 6pm) Diet: NPO  Assessment:  66 yoF admit with cyclic TNA last outpatient dose on 5/14; resumed inpatient on 5/16  TNA initially started inpatient on 4/25 for partial SBO (resolving per Xray 5/15)  Electrolytes:  wnl, SCr continues to increase  Albumin low, 2.3 (5/16), AST/ALT wnl (5/16)  IVF: NS at  50 ml/hr  Ppx: Protonix q12h  Nutritional Goals:   RD recs 5/15:  Kcal 1750-2050/day, Protein 90-105 g/day, Fluid 1.7-2L/day  Clinimix 5/20 at goal of + MWF only lipids  20% 240 ml = average of 1896 kcal/day/wk and 96 g protein/day  Plan:  At 1800 today:  Cyclic Clinimix E 5/20 1920 mL over 14 hours  Fat emulsion at 4ml/hr over 14 hours (MWF only due to ongoing shortage).  TNA to contain standard multivitamins and trace elements (MWF only due to ongoing shortage).  CBGs and Sensitive SSI Q8h, scheduled while TNA is running  TNA lab panels on Mondays & Thursdays.  F/u daily.   Lynann Beaver PharmD, BCPS Pager [phone] 05/29/2011 8:11 AM

## 2011-05-29 NOTE — Progress Notes (Signed)
Medical oncology  Full note to follow  Patient febrile > 101 with WBC still 0.4 despite neupogen, on zosyn + vanc. Much increased abdominal pain and fullness, with reaccumulated ascites by [place] yesterday. Dilaudid 1 mg q 3 hrs not holding long enough. Blood cultures done and pending.  Awake, alert and fully oriented now. We have discussed fact that she could have infection in abdomen from fluid, G tube, bowels, or other lines. We have discussed life support/ resuscitation again and she does not want this in irreversible situation. I have written DNR now. Patient has told mother and sister at bedside. Patient aware that she may not live thru this complication. I will increase neupogen and add IV flagyl. Agree with US paracentesis for comfort today as planned. May need dilaudid qtt. Would move to oncology unit if bed available.  Jama Flavors, MD

## 2011-05-29 NOTE — Progress Notes (Signed)
This visit was in response to a Spiritual Care Consult.  Krystal [name] was in great pain when I entered.  Family and friends were around bedside.  Family was upset because they felt not enough was being done to take care of their loved one.   They told me she was scheduled for a procedure to help with the pain this morning at 8 a.m. She still was waiting for the procedure at 2:15 pm.  I offered to find the nurse and and let her know how badly the pt was feeling.  The nurse assured me that she was doing everything possible to control the pain.  The ultrasound dept is aware of the request for the procedure, and the nurse has been calling to remind them.   I offered support to the family and to the staff.  Please page the Spiritual care department if further assistance is needed.  Follow-up is being planned.  [name]  [phone] oncall pager

## 2011-05-29 NOTE — Progress Notes (Addendum)
ANTIBIOTIC CONSULT NOTE - Follow up  Pharmacy Consult for Vanc, Zosyn, Levaquin Indication: neutropenic fever  No Known Allergies  Patient Measurements: Height: 5\' 6"  (167.6 cm) Weight: 201 lb 11.5 oz (91.5 kg) IBW/kg (Calculated) : 59.3   Vital Signs: Temp: 101.9 F (38.8 C) (05/17 0551) Temp src: Oral (05/17 0551) BP: 126/86 mmHg (05/17 0551) Pulse Rate: 120  (05/17 0551) Labs:  Basename 05/29/11 0420 05/28/11 0604 05/27/11 1230  WBC 0.4* 0.4* 0.5*  HGB 8.0* 8.5* 7.7*  PLT 163 249 252  LABCREA -- -- --  CREATININE 1.59* 1.21* 1.06   Estimated Creatinine Clearance: 52 ml/min (by C-G formula based on Cr of 1.59).  Basename 05/29/11 1105  VANCOTROUGH 26.2*  VANCOPEAK --  Drue Dun --  GENTTROUGH --  GENTPEAK --  GENTRANDOM --  TOBRATROUGH --  TOBRAPEAK --  TOBRARND --  AMIKACINPEAK --  AMIKACINTROU --  AMIKACIN --     Microbiology: 5/15 blood: NGTD 5/15 urine: insignificant growth 5/17 blood: pending   Medical History: Past Medical History  Diagnosis Date  . Hypertension     SINCE FIRST PREGNANCY  . Asthma   . Constipation   . History of miscarriage   . Preeclampsia   . Postoperative ileus 07/2010  . Iron deficiency anemia     s/p transfusions [facility]  . Uterine cancer   . Bowel obstruction    Medications:  Anti-infectives     Start     Dose/Rate Route Frequency Ordered Stop   05/29/11 1000   metroNIDAZOLE (FLAGYL) IVPB 500 mg     Comments: Pharmacy to dose please      500 mg 100 mL/hr over 60 Minutes Intravenous Every 8 hours 05/29/11 0909     05/28/11 1200   vancomycin (VANCOCIN) 1,250 mg in sodium chloride 0.9 % 250 mL IVPB        1,250 mg 166.7 mL/hr over 90 Minutes Intravenous Every 12 hours 05/28/11 0846     05/27/11 1800   vancomycin (VANCOCIN) IVPB 1000 mg/200 mL premix  Status:  Discontinued        1,000 mg 200 mL/hr over 60 Minutes Intravenous Every 8 hours 05/27/11 1135 05/28/11 0846   05/27/11 1400  piperacillin-tazobactam  (ZOSYN) IVPB 3.375 g       3.375 g 12.5 mL/hr over 240 Minutes Intravenous 3 times per day 05/27/11 1135     05/27/11 0645   vancomycin (VANCOCIN) IVPB 1000 mg/200 mL premix  Status:  Discontinued        1,000 mg 200 mL/hr over 60 Minutes Intravenous  Once 05/27/11 0622 05/27/11 1101   05/27/11 0645  piperacillin-tazobactam (ZOSYN) IVPB 3.375 g       3.375 g 100 mL/hr over 30 Minutes Intravenous  Once 05/27/11 0622 05/27/11 0720         Assessment:  41 yoF admit with neutropenic fever  Day #3 vanc and zosy, D#1 metronidazole, D#1 levaquin  SCr increasing, 0.93 > 1.59, with CrCl ~ 52 ml/min  Blood and Urine cultures ordered  Vancomycin dose empirically reduced yesterday d/t renal insufficiency  Vancomycin trough today supratherapeutic at 26  Goal of Therapy:  Vancomycin trough level 15-20 mcg/ml  Plan:   Hold vancomycin x1 dose today for high trough level  Resume at reduced dose: Vancomycin 750mg  IV Q12h  Continue Zosyn 3.375g IV Q8H infused over 4hrs.  Continue metronidazole as ordered by MD  Start Levaquin 750mg  IV Q24h  Re-check Vanc trough at steady state.  Follow up renal fxn and  culture results.   Lynann Beaver PharmD, BCPS Pager [phone] 05/29/2011 9:25 AM

## 2011-05-29 NOTE — Procedures (Signed)
US guided therapeutic paracentesis performed yielding 3.6 liters yellow fluid. No immediate complications. 

## 2011-05-30 DIAGNOSIS — R509 Fever, unspecified: Secondary | ICD-10-CM

## 2011-05-30 DIAGNOSIS — D709 Neutropenia, unspecified: Secondary | ICD-10-CM

## 2011-05-30 DIAGNOSIS — D61818 Other pancytopenia: Secondary | ICD-10-CM

## 2011-05-30 DIAGNOSIS — C549 Malignant neoplasm of corpus uteri, unspecified: Secondary | ICD-10-CM

## 2011-05-30 DIAGNOSIS — R5081 Fever presenting with conditions classified elsewhere: Secondary | ICD-10-CM

## 2011-05-30 DIAGNOSIS — N8502 Endometrial intraepithelial neoplasia [EIN]: Secondary | ICD-10-CM

## 2011-05-30 DIAGNOSIS — R188 Other ascites: Secondary | ICD-10-CM

## 2011-05-30 DIAGNOSIS — R1033 Periumbilical pain: Secondary | ICD-10-CM

## 2011-05-30 LAB — CBC
MCH: 26.4 pg (ref 26.0–34.0)
MCHC: 30.8 g/dL (ref 30.0–36.0)
Platelets: 86 10*3/uL — ABNORMAL LOW (ref 150–400)
RDW: 15.7 % — ABNORMAL HIGH (ref 11.5–15.5)

## 2011-05-30 LAB — HIV ANTIBODY (ROUTINE TESTING W REFLEX): HIV: NONREACTIVE

## 2011-05-30 LAB — DIFFERENTIAL
Basophils Absolute: 0 10*3/uL (ref 0.0–0.1)
Eosinophils Relative: 0 % (ref 0–5)
Lymphs Abs: 0 10*3/uL — ABNORMAL LOW (ref 0.7–4.0)
Monocytes Absolute: 0 10*3/uL — ABNORMAL LOW (ref 0.1–1.0)

## 2011-05-30 LAB — BASIC METABOLIC PANEL
Calcium: 8.6 mg/dL (ref 8.4–10.5)
GFR calc non Af Amer: 38 mL/min — ABNORMAL LOW (ref >90)
Sodium: 137 mEq/L (ref 135–145)

## 2011-05-30 LAB — GLUCOSE, CAPILLARY

## 2011-05-30 MED ORDER — POTASSIUM CHLORIDE 10 MEQ/100ML IV SOLN
10.0000 meq | INTRAVENOUS | Status: AC
  Administered 2011-05-30 – 2011-05-31 (×3): 10 meq via INTRAVENOUS
  Filled 2011-05-30 (×3): qty 100

## 2011-05-30 MED ORDER — POTASSIUM CHLORIDE 10 MEQ/100ML IV SOLN
10.0000 meq | INTRAVENOUS | Status: AC
  Administered 2011-05-30 (×2): 10 meq via INTRAVENOUS
  Filled 2011-05-30 (×2): qty 100

## 2011-05-30 MED ORDER — CLINIMIX E/DEXTROSE (5/20) 5 % IV SOLN
INTRAVENOUS | Status: AC
  Filled 2011-05-30: qty 2000

## 2011-05-30 NOTE — Progress Notes (Signed)
PARENTERAL NUTRITION CONSULT NOTE - Follow up  Pharmacy Consult for TNA Indication: Hx of pSBO  No Known Allergies  Patient Measurements: Height: 5\' 6"  (167.6 cm) Weight: 189 lb (85.73 kg) IBW/kg (Calculated) : 59.3  Adjusted Body Weight:   Vital Signs: Temp: 99.8 F (37.7 C) (05/18 0636) Temp src: Oral (05/18 0636) BP: 105/75 mmHg (05/18 0636) Pulse Rate: 91  (05/18 0636) Intake/Output from previous day: 05/17 0701 - 05/18 0700 In: 8009 [I.V.:2978; IV Piggyback:100; TPN:4931] Out: 100 [Drains:100]  Labs:  [facility] 05/30/11 0515 05/29/11 0420 05/28/11 0604 05/27/11 1230  WBC 0.5* 0.4* 0.4* --  HGB 7.8* 8.0* 8.5* --  HCT 25.3* 26.0* 27.2* --  PLT 86* 163 249 --  APTT -- -- -- 32  INR -- -- -- 1.07     Basename 05/30/11 0515 05/29/11 0420 05/28/11 0604 05/27/11 1230  NA 137 137 133* --  K 3.4* 3.9 4.2 --  CL 102 100 97 --  CO2 26 25 26  --  GLUCOSE 130* 161* 88 --  BUN 24* 27* 19 --  CREATININE 1.61* 1.59* 1.21* --  LABCREA -- -- -- --  CREAT24HRUR -- -- -- --  CALCIUM 8.6 8.8 9.0 --  MG -- -- 2.1 2.0  PHOS -- -- 4.3 3.8  PROT -- -- 6.8 6.0  ALBUMIN -- -- 2.3* 2.1*  AST -- -- 30 32  ALT -- -- 29 23  ALKPHOS -- -- 140* 123*  BILITOT -- -- 0.7 0.5  BILIDIR -- -- -- --  IBILI -- -- -- --  PREALBUMIN -- -- 14.5* --  TRIG -- -- 96 --  CHOLHDL -- -- -- --  CHOL -- -- 198 --   Estimated Creatinine Clearance: 49.7 ml/min (by C-G formula based on Cr of 1.61).    Basename 05/30/11 0542 05/30/11 0004 05/29/11 1836  GLUCAP 153* 116* 111*    Medical History: Past Medical History  Diagnosis Date  . Hypertension     SINCE FIRST PREGNANCY  . Asthma   . Constipation   . History of miscarriage   . Preeclampsia   . Postoperative ileus 07/2010  . Iron deficiency anemia     s/p transfusions [facility]  . Uterine cancer   . Bowel obstruction     Medications:  Scheduled:     . cloNIDine  0.1 mg Transdermal Weekly  . filgrastim  480 mcg Subcutaneous Daily  .  insulin aspart  0-9 Units Subcutaneous TID  . lactulose  30 g Per Tube TID  . levofloxacin (LEVAQUIN) IV  750 mg Intravenous Q24H  . pantoprazole (PROTONIX) IV  40 mg Intravenous Q12H  . piperacillin-tazobactam (ZOSYN)  IV  3.375 g Intravenous Q8H  . sodium chloride  3 mL Intravenous Q12H  . vancomycin  750 mg Intravenous Q12H  . DISCONTD: ceFEPime (MAXIPIME) IV  1 g Intravenous Q8H  . DISCONTD: filgrastim (NEUPOGEN)  SQ  300 mcg Subcutaneous Daily  . DISCONTD: insulin aspart  0-9 Units Subcutaneous Q6H  . DISCONTD: metronidazole  500 mg Intravenous Q8H  . DISCONTD: vancomycin  1,250 mg Intravenous Q12H   Infusions:     . sodium chloride 50 mL/hr at 05/28/11 0930  . TPN (CLINIMIX) +/- additives     And  . fat emulsion    . TPN (CLINIMIX) +/- additives 150 mL/hr at 05/28/11 2215   Insulin Requirements in the past 24 hours:  CBGs: 128 103 111 116 153 Insulin: 4 units  Current Nutrition:  TNA: Clinimix E 5/15  cyclic over 14 hours (50 ml/hr from 6pm-7pm, 150 ml/hr from 7pm-7am, and 50 ml/hr from 7am-8am.  Off from 8am until 6pm) Diet: NPO  Assessment:  41 yof admit with cyclic TNA last outpatient dose on 5/14; resumed inpatient on 5/16  TNA initially started during previous admission (4/25) for partial SBO (resolving per Xray 5/15)  Electrolytes:  K low, other lytes ok, SCr continues to increase  Albumin low, 2.3 (5/16), AST/ALT wnl (5/16)  IVF: NS at 50 ml/hr  Ppx: Protonix q12h  Nutritional Goals:   RD recs 5/15:  Kcal 1750-2050/day, Protein 90-105 g/day, Fluid 1.7-2L/day  Clinimix 5/20 at goal of + MWF only lipids  20% 240 ml = average of 1896 kcal/day/wk and 96 g protein/day  Plan:   KCl 63meq/100ml IV q1h x 2, F/U AM Bmet  Continue cyclic Clinimix E 5/20 1900 mL over 14 hours  Fat emulsion at 75ml/hr over 14 hours (MWF only due to ongoing shortage).  TNA to contain standard multivitamins and trace elements (MWF only due to ongoing shortage).  CBGs  and Sensitive SSI Q8h, scheduled while TNA is running  TNA lab panels on Mondays & Thursdays.  F/u daily.   Gwen Her PharmD  [phone] 05/30/2011 7:12 AM

## 2011-05-30 NOTE — Progress Notes (Addendum)
Patient ID: Krystal [name], female   DOB: 1970/08/01, 41 y.o.   MRN: [id number]  Interim Summary:  41 year old female with history of metastatic endometrial carcinoma, cycle 2 adriamycin (single agent), given 05/20/2011, recent lengthy hospitalization for bowel obstruction (requireing G tube intermittent suction) related to the recurrent cancer. Patient admitted 05/27/2011 with working diagnosis of neutropenic fever.   Consults to date:  1. Oncology (dr. Darrold Span)  2. Interventional radiology - for paracentesis  3. Physical Therapy   Assessment/Plan:   Principal Problem:   *Neutropenic fever  - secondary to immunosuppressed status secondary to possible spontaneous bacterial peritonitis secondary to multiple lines patient requires (G tube, TNA)  - blood culture x 2 sets from 05/27/2011 negative to date; urine culture 05/27/2011 negative to date  - patient spiked fever Tmax 101.9 F in last 12 hours; follow up blood culture results on 05/29/2011 are negative as well; CXR done 05/29/2011 shows Increased retrocardiac opacity could reflect worsening basilar atelectasis or infection/pneumonia. Antibiotic coverage at this time includes vancomycin and zosyn. Patient is status post recent lengthy hositalization so risk is for healthcare associated pneumonia for which reason we added Levaquin to her regimen.  - Neupogen 300 mg x 1 dose given stat on admission and we continued this dosage on daily basis per oncology  - WBC remain low at 0.5 - appreciate oncology following   Active Problems:   Partial small bowel obstruction  - Treated conservatively with bowel rest and intermittent G tube suction  - A G-tube was placed by interventional radiology for symptom management on 05/14/2011.  - TNA per pharmacy for nutritional support   Endometrial ca / Ascites  - Status post treatment with cisplatin/Adriamycin (cycle 2 given 05/20/2011)  - patient continues to complain of abdominal distention  - abdominal x ray with  findings of resolving but not yet completely resolved small bowel obstruction  - status post paracentesis on previous admissions (Paracentesis done on 05/12/2011 and 05/18/2011)  - abdominal ultrasound with large volume ascites 05/28/2011  - paracentesis  done 05/29/2011 with 3 liters fluid removed  Acute kidney injury  - creatinine slowly trending up from 1.06 on admission and today 1.61 - possibly due to ascites/? Hepatorenal syndrome  - continue to monitor renal function   Inadequate oral intake  - TNA for better nutritional support   Hypertension  - BP 113/79 - well controlled   Anemia of chronic disease  - secondary to malignancy  - hemoglobin 7.8 today  - give 1 unit PRBC transfusion - we will continue to monitor CBC   Code Status:  - DNR   Family Communication:  - Updated at bedside.   Disposition Plan:  - Home with home health PT   Antibiotics:  Zosyn 05/27/2011 --->  Vancomycin 05/27/11 --->  Levaquin 05/29/2011 -->  Subjective: No events overnight. Patient is in better spirits and mood today.   Objective:  Vital signs in last 24 hours:  Filed Vitals:   05/30/11 0500 05/30/11 0636 05/30/11 0647 05/30/11 1300  BP:  105/75  113/79  Pulse:  91  111  Temp: 100 F (37.8 C) 99.8 F (37.7 C)  99.2 F (37.3 C)  TempSrc: Oral Oral    Resp:  20  20  Height:      Weight:   85.73 kg (189 lb)   SpO2:  95%  96%    Intake/Output from previous day:   Intake/Output Summary (Last 24 hours) at 05/30/11 1550 Last data filed at 05/30/11  1300  Gross per 24 hour  Intake   6554 ml  Output    501 ml  Net   6053 ml    Physical Exam: General: Alert, awake, oriented x3, in no acute distress. HEENT: No bruits, no goiter. Moist mucous membranes, no scleral icterus, no conjunctival pallor. Heart: Regular rate and rhythm, S1/S2 +, no murmurs, rubs, gallops. Lungs: Clear to auscultation bilaterally. No wheezing, no rhonchi, no rales.  Abdomen: less distended and minimal  tenderness on palpation. Extremities: No clubbing or cyanosis, no pitting edema,  positive pedal pulses. Neuro: Grossly nonfocal.  Lab Results:  Lab 05/30/11 0515 05/29/11 0420 05/28/11 0604 05/27/11 1230 05/27/11 0515  WBC 0.5* 0.4* 0.4* 0.5* 0.6*  HGB 7.8* 8.0* 8.5* 7.7* 8.3*  HCT 25.3* 26.0* 27.2* 24.4* 26.7*  PLT 86* 163 249 252 283  MCV 85.5 85.5 84.2 84.7 85.3    Lab 05/30/11 0515 05/29/11 0420 05/28/11 0604 05/27/11 1230 05/27/11 0515  NA 137 137 133* 136 136  K 3.4* 3.9 4.2 4.0 4.5  CL 102 100 97 100 100  CO2 26 25 26 28 26   GLUCOSE 130* 161* 88 79 87  BUN 24* 27* 19 27* 30*  CREATININE 1.61* 1.59* 1.21* 1.06 0.93  CALCIUM 8.6 8.8 9.0 8.3* 9.0    Lab 05/27/11 1230  INR 1.07  PROTIME --    URINE CULTURE     Status: Normal   Collection Time   05/27/11  7:00 AM      Component Value Range Status Comment   Specimen Description URINE,   Final    Special Requests NONE   Final    Culture  Setup Time [id number]   Final    Colony Count 8,000 COLONIES/ML  Final    Culture INSIGNIFICANT GROWTH   Final    Report Status 05/28/2011 FINAL   Final   CULTURE, BLOOD (ROUTINE X 2)     Status: Normal (Preliminary result)   Collection Time   05/27/11  7:15 AM      Component Value Range Status Comment   Culture  Setup Time [id number]   Final    Culture     Final    Value:        BLOOD CULTURE RECEIVED NO GROWTH TO DATEREPORT   Report Status PENDING   Incomplete   CULTURE, BLOOD (ROUTINE X 2)     Status: Normal (Preliminary result)   Collection Time   05/27/11  9:25 AM      Component Value Range Status Comment   Culture  Setup Time [id number]   Final    Culture     Final    Value:        BLOOD CULTURE RECEIVED NO GROWTH TO DATE   Report Status PENDING   Incomplete   CULTURE, BLOOD (ROUTINE X 2)     Status: Normal (Preliminary result)   Collection Time   05/29/11  7:00 AM      Component Value Range Status Comment   Culture  Setup Time [id number]   Final    Culture      Final    Value:        BLOOD CULTURE RECEIVED NO GROWTH TO DATE    Report Status PENDING   Incomplete   CULTURE, BLOOD (ROUTINE X 2)     Status: Normal (Preliminary result)   Collection Time   05/29/11  7:10 AM      Component Value Range Status Comment   Culture  Setup Time [id number]   Final    Culture     Final    Value:        BLOOD CULTURE RECEIVED NO GROWTH TO DATE    Report Status PENDING   Incomplete     Studies/Results: US Paracentesis 2011-06-21  *RADIOLOGY REPORT*  Clinical Data: Metastatic endometrial carcinoma, recurrent ascites, abdominal pain; request is made for therapeutic paracentesis.  ULTRASOUND GUIDED THERAPEUTIC  PARACENTESIS  An ultrasound guided paracentesis was thoroughly discussed with the patient and questions answered.  The benefits, risks, alternatives and complications were also discussed.  The patient understands and wishes to proceed with the procedure.  Written consent was obtained.  Ultrasound was performed to localize and mark an adequate pocket of fluid in the left lower quadrant of the abdomen.  The area was then prepped and draped in the normal sterile fashion.  1% Lidocaine was used for local anesthesia.  Under ultrasound guidance a 19 gauge Yueh catheter was introduced.  Paracentesis was performed.  The catheter was removed and a dressing applied.  Complications:  none  Findings:  A total of approximately 3.6 liters of yellow fluid was removed.  IMPRESSION: Successful ultrasound guided therapeutic  paracentesis yielding 3.6 liters of ascites.  Read by: Jeananne Rama, P.A.-C  Original Report Authenticated By: Reola Calkins, M.D.   Dg Chest Port 1v Same Day 2011-06-21  *RADIOLOGY REPORT*  Clinical Data: 41 year old female with neutropenic fever. Endometrial carcinoma.  PORTABLE CHEST - 1 VIEW SAME DAY  Comparison: 05/27/2011 and earlier.  Findings: Portable semi upright AP view of the chest at 0909 hours. Continued low lung volumes.  Stable right chest  Port-A-Cath.  Right PICC line now in place.  Increased retrocardiac opacity.  Left of the plate like atelectasis at both lung bases.  Stable cardiac size and mediastinal contours.  Visualized tracheal air column is within normal limits.  No pneumothorax, pulmonary edema or large effusion.  IMPRESSION: Increased retrocardiac opacity could reflect worsening basilar atelectasis or infection/pneumonia.  Original Report Authenticated By: Harley Hallmark, M.D.    Medications: Scheduled Meds:   . cloNIDine  0.1 mg Transdermal Weekly  . filgrastim  480 mcg Subcutaneous Daily  . insulin aspart  0-9 Units Subcutaneous TID  . lactulose  30 g Per Tube TID  . levofloxacin (LEVAQUIN) IV  750 mg Intravenous Q24H  . pantoprazole (PROTONIX) IV  40 mg Intravenous Q12H  . piperacillin-tazobactam (ZOSYN)  IV  3.375 g Intravenous Q8H  . potassium chloride  10 mEq Intravenous Q1 Hr x 2  . sodium chloride  3 mL Intravenous Q12H  . vancomycin  750 mg Intravenous Q12H   Continuous Infusions:   . sodium chloride 50 mL/hr at 05/28/11 0930  . TPN (CLINIMIX) +/- additives     And  . fat emulsion    . TPN (CLINIMIX) +/- additives     PRN Meds:.acetaminophen (TYLENOL) oral liquid 160 mg/5 mL, HYDROmorphone (DILAUDID) injection, lidocaine, lip balm, LORazepam, morphine, ondansetron (ZOFRAN) IV, promethazine, sodium chloride    LOS: 3 days   Oneal Schoenberger 05/30/2011, 3:50 PM  [facility] Pager: [phone]

## 2011-05-30 NOTE — Progress Notes (Addendum)
The patient is curious as to why she was only given one neupogen injection after her last round of chemo.  Per patient she usually takes a series of five injections after a round of chemo.  Please have MD address this concern with the patient.

## 2011-05-30 NOTE — Progress Notes (Signed)
IP PROGRESS NOTE  Subjective:   No nausea. She reports a sore at the left side of the tongue. She underwent a paracentesis yesterday.  Objective: Vital signs in last 24 hours: Blood pressure 105/75, pulse 91, temperature 99.8 F (37.7 C), temperature source Oral, resp. rate 20, height 5\' 6"  (1.676 m), weight 189 lb (85.73 kg), last menstrual period 07/02/2010, SpO2 95.00%.  Intake/Output from previous day: 05/17 0701 - 05/18 0700 In: 8009 [I.V.:2978; IV Piggyback:100; TPN:4931] Out: 100 [Drains:100]  Physical Exam:  HEENT: No thrush,? Early ulcer at the left side of the tongue Lungs: Clear bilaterally Cardiac: Regular rate and rhythm Abdomen: Mildly distended, nontender, gastrostomy tube in place Extremities: No leg edema   Portacath/PICC-without erythema  Lab Results:  Basename 05/30/11 0515 05/29/11 0420  WBC 0.5* 0.4*  HGB 7.8* 8.0*  HCT 25.3* 26.0*  PLT 86* 163    BMET  Basename 05/30/11 0515 05/29/11 0420  NA 137 137  K 3.4* 3.9  CL 102 100  CO2 26 25  GLUCOSE 130* 161*  BUN 24* 27*  CREATININE 1.61* 1.59*  CALCIUM 8.6 8.8    Studies/Results: US Abdomen Complete  05/28/2011  *RADIOLOGY REPORT*  Clinical Data:  Ascites.  Elevated liver function studies.  COMPLETE ABDOMINAL ULTRASOUND  Comparison:  CT scan 05/18/2011.  Findings:  Gallbladder:  No gallstones, gallbladder wall thickening, or pericholecystic fluid.Gallbladder sludge is noted.  Common bile duct:  Normal in caliber measuring a maximum of 5.32mm.  Liver:  Diffuse decreased echogenicity of the liver could be secondary to edema from passive congestion or hepatitis.  No focal mass lesion or intrahepatic biliary dilatation.  The small lesion seen on the previous CT are not well visualized on ultrasound.  IVC:  Not well visualized.  Pancreas:  Not visualized.  Spleen:  Normal size and echogenicity without focal lesions.  Right Kidney:  11.9 cm in length. Normal renal cortical thickness and echogenicity  without focal lesions or hydronephrosis.  Left Kidney:  12.3 cm in length.  Mild hydronephrosis.  Normal renal cortical thickness and echogenicity.  Abdominal aorta:  Not visualized.  Additional findings:  Large volume ascites.  Prior CT scan showed extensive omental tumor and ascites.  IMPRESSION:  1.  Gallbladder sludge but no gallstones or findings for acute cholecystitis. 2.  Normal caliber common bile duct. 3.  Diffuse decreased echogenicity of the liver may be due to passive congestion and/or hepatitis. 4.  Small hepatic lesions seen on prior CT scan are not well seen on ultrasound. 5.  Large volume ascites.  Original Report Authenticated By: P. Loralie Champagne, M.D.   US Paracentesis  05/29/2011  *RADIOLOGY REPORT*  Clinical Data: Metastatic endometrial carcinoma, recurrent ascites, abdominal pain; request is made for therapeutic paracentesis.  ULTRASOUND GUIDED THERAPEUTIC  PARACENTESIS  An ultrasound guided paracentesis was thoroughly discussed with the patient and questions answered.  The benefits, risks, alternatives and complications were also discussed.  The patient understands and wishes to proceed with the procedure.  Written consent was obtained.  Ultrasound was performed to localize and mark an adequate pocket of fluid in the left lower quadrant of the abdomen.  The area was then prepped and draped in the normal sterile fashion.  1% Lidocaine was used for local anesthesia.  Under ultrasound guidance a 19 gauge Yueh catheter was introduced.  Paracentesis was performed.  The catheter was removed and a dressing applied.  Complications:  none  Findings:  A total of approximately 3.6 liters of yellow fluid was removed.  IMPRESSION: Successful ultrasound guided therapeutic  paracentesis yielding 3.6 liters of ascites.  Read by: Jeananne Rama, P.A.-C  Original Report Authenticated By: Reola Calkins, M.D.   Dg Chest Port 1v Same Day  05/29/2011  *RADIOLOGY REPORT*  Clinical Data: 41 year old female  with neutropenic fever. Endometrial carcinoma.  PORTABLE CHEST - 1 VIEW SAME DAY  Comparison: 05/27/2011 and earlier.  Findings: Portable semi upright AP view of the chest at 0909 hours. Continued low lung volumes.  Stable right chest Port-A-Cath.  Right PICC line now in place.  Increased retrocardiac opacity.  Left of the plate like atelectasis at both lung bases.  Stable cardiac size and mediastinal contours.  Visualized tracheal air column is within normal limits.  No pneumothorax, pulmonary edema or large effusion.  IMPRESSION: Increased retrocardiac opacity could reflect worsening basilar atelectasis or infection/pneumonia.  Original Report Authenticated By: Harley Hallmark, M.D.    Medications: I have reviewed the patient's current medications.  Assessment/Plan:  1. Metastatic high-grade endometrial carcinoma, status post Adriamycin 05/20/2011  2. Pancytopenia secondary to chemotherapy-maintained on G-CSF support  3. Febrile neutropenia-she continues to have a low-grade fever. She is maintained on broad-spectrum antibiotics. Cultures are negative to date.  4. Nutrition-maintained on TNA   Recommendations  1. Continue broad-spectrum antibiotic support 2. Continue G-CSF 3. Follow daily CBC  LOS: 3 days   Akiva Josey  05/30/2011, 8:11 AM

## 2011-05-31 ENCOUNTER — Inpatient Hospital Stay (HOSPITAL_COMMUNITY): Payer: Medicaid Other

## 2011-05-31 DIAGNOSIS — R188 Other ascites: Secondary | ICD-10-CM

## 2011-05-31 DIAGNOSIS — N8502 Endometrial intraepithelial neoplasia [EIN]: Secondary | ICD-10-CM

## 2011-05-31 DIAGNOSIS — R509 Fever, unspecified: Secondary | ICD-10-CM

## 2011-05-31 DIAGNOSIS — R1033 Periumbilical pain: Secondary | ICD-10-CM

## 2011-05-31 LAB — GLUCOSE, CAPILLARY
Glucose-Capillary: 135 mg/dL — ABNORMAL HIGH (ref 70–99)
Glucose-Capillary: 164 mg/dL — ABNORMAL HIGH (ref 70–99)
Glucose-Capillary: 106 mg/dL — ABNORMAL HIGH (ref 70–99)

## 2011-05-31 LAB — LEGIONELLA ANTIGEN, URINE: Legionella Antigen, Urine: NEGATIVE

## 2011-05-31 LAB — BASIC METABOLIC PANEL
CO2: 24 mEq/L (ref 19–32)
Calcium: 8.4 mg/dL (ref 8.4–10.5)
Glucose, Bld: 97 mg/dL (ref 70–99)
Potassium: 3.9 mEq/L (ref 3.5–5.1)
Sodium: 137 mEq/L (ref 135–145)

## 2011-05-31 LAB — CBC
HCT: 25.6 % — ABNORMAL LOW (ref 36.0–46.0)
MCHC: 32 g/dL (ref 30.0–36.0)
MCV: 83.9 fL (ref 78.0–100.0)
Platelets: 86 10*3/uL — ABNORMAL LOW (ref 150–400)
RDW: 15.6 % — ABNORMAL HIGH (ref 11.5–15.5)

## 2011-05-31 LAB — DIFFERENTIAL
Basophils Absolute: 0 10*3/uL (ref 0.0–0.1)
Eosinophils Absolute: 0 10*3/uL (ref 0.0–0.7)
Lymphs Abs: 0.5 10*3/uL — ABNORMAL LOW (ref 0.7–4.0)
Monocytes Absolute: 0.3 10*3/uL (ref 0.1–1.0)
Neutro Abs: 0.6 10*3/uL — ABNORMAL LOW (ref 1.7–7.7)

## 2011-05-31 MED ORDER — BENZONATATE 100 MG PO CAPS
100.0000 mg | ORAL_CAPSULE | Freq: Three times a day (TID) | ORAL | Status: DC | PRN
  Filled 2011-05-31: qty 1

## 2011-05-31 MED ORDER — GUAIFENESIN ER 600 MG PO TB12
600.0000 mg | ORAL_TABLET | Freq: Two times a day (BID) | ORAL | Status: DC
  Administered 2011-05-31: 600 mg via ORAL
  Filled 2011-05-31 (×2): qty 1

## 2011-05-31 MED ORDER — CLINIMIX E/DEXTROSE (5/20) 5 % IV SOLN
INTRAVENOUS | Status: AC
  Administered 2011-05-31: 17:00:00 via INTRAVENOUS
  Filled 2011-05-31: qty 2000

## 2011-05-31 MED ORDER — HYDROCOD POLST-CHLORPHEN POLST 10-8 MG/5ML PO LQCR
5.0000 mL | Freq: Every evening | ORAL | Status: DC | PRN
  Administered 2011-05-31: 5 mL via ORAL
  Filled 2011-05-31: qty 5

## 2011-05-31 MED ORDER — BENZOCAINE 10 % MT GEL
Freq: Three times a day (TID) | OROMUCOSAL | Status: DC | PRN
  Filled 2011-05-31: qty 9.4

## 2011-05-31 MED ORDER — GUAIFENESIN ER 600 MG PO TB12
600.0000 mg | ORAL_TABLET | Freq: Two times a day (BID) | ORAL | Status: DC | PRN
  Filled 2011-05-31: qty 1

## 2011-05-31 MED ORDER — FLUCONAZOLE 100MG IVPB
100.0000 mg | INTRAVENOUS | Status: DC
  Administered 2011-05-31 – 2011-06-02 (×3): 100 mg via INTRAVENOUS
  Filled 2011-05-31 (×7): qty 50

## 2011-05-31 NOTE — Progress Notes (Signed)
PARENTERAL NUTRITION CONSULT NOTE - Follow up  Pharmacy Consult for TNA Indication: Hx of pSBO  No Known Allergies  Patient Measurements: Height: 5\' 6"  (167.6 cm) Weight: 187 lb 6.3 oz (85 kg) IBW/kg (Calculated) : 59.3  Adjusted Body Weight:   Vital Signs: Temp: 100.6 F (38.1 C) (05/19 0800) Intake/Output from previous day: 05/18 0701 - 05/19 0700 In: 730 [P.O.:480; I.V.:250] Out: 501 [Urine:500; Stool:1]  Labs:  [facility] 05/31/11 1223 05/30/11 0515 05/29/11 0420  WBC 1.4* 0.5* 0.4*  HGB 8.2* 7.8* 8.0*  HCT 25.6* 25.3* 26.0*  PLT 86* 86* 163  APTT -- -- --  INR -- -- --     Basename 05/31/11 1223 05/30/11 0515 05/29/11 0420  NA 137 137 137  K 3.9 3.4* 3.9  CL 102 102 100  CO2 24 26 25   GLUCOSE 97 130* 161*  BUN 28* 24* 27*  CREATININE 1.54* 1.61* 1.59*  LABCREA -- -- --  CREAT24HRUR -- -- --  CALCIUM 8.4 8.6 8.8  MG -- -- --  PHOS -- -- --  PROT -- -- --  ALBUMIN -- -- --  AST -- -- --  ALT -- -- --  ALKPHOS -- -- --  BILITOT -- -- --  BILIDIR -- -- --  IBILI -- -- --  PREALBUMIN -- -- --  TRIG -- -- --  CHOLHDL -- -- --  CHOL -- -- --   Estimated Creatinine Clearance: 51.8 ml/min (by C-G formula based on Cr of 1.54).    Basename 05/31/11 0627 05/31/11 0011 05/30/11 1805  GLUCAP 164* 135* 92    Medical History: Past Medical History  Diagnosis Date  . Hypertension     SINCE FIRST PREGNANCY  . Asthma   . Constipation   . History of miscarriage   . Preeclampsia   . Postoperative ileus 07/2010  . Iron deficiency anemia     s/p transfusions [facility]  . Uterine cancer   . Bowel obstruction     Medications:  Scheduled:     . cloNIDine  0.1 mg Transdermal Weekly  . filgrastim  480 mcg Subcutaneous Daily  . fluconazole (DIFLUCAN) IV  100 mg Intravenous Q24H  . insulin aspart  0-9 Units Subcutaneous TID  . lactulose  30 g Per Tube TID  . levofloxacin (LEVAQUIN) IV  750 mg Intravenous Q24H  . pantoprazole (PROTONIX) IV  40 mg Intravenous  Q12H  . piperacillin-tazobactam (ZOSYN)  IV  3.375 g Intravenous Q8H  . potassium chloride  10 mEq Intravenous Q1 Hr x 3  . sodium chloride  3 mL Intravenous Q12H  . vancomycin  750 mg Intravenous Q12H   Infusions:     . sodium chloride 50 mL/hr at 05/31/11 0027  . fat emulsion    . TPN (CLINIMIX) +/- additives     Insulin Requirements in the past 24 hours:  CBGs: 116 153 92 135 164 Insulin: 3 units  Current Nutrition:  TNA: Clinimix E 5/15 cyclic over 14 hours (50 ml/hr from 6pm-7pm, 150 ml/hr from 7pm-7am, and 50 ml/hr from 7am-8am.  Off from 8am until 6pm) Diet: Regular (0-35% meal trays documented)  Assessment:  41YOF w/TNA started during previous admission (4/25) for partial SBO (resolving per Xray 5/15). Changed to cyclic at the end of previous admission and d/c'd on cyclic TNA. Pt readmitted 5/15 and cyclic TNA resumed  Electrolytes:  K now wnl after replacement. Other lytes ok. Scr down some, CrCl 50  CBG within goal < 150  Albumin low,  2.3 (5/16), AST/ALT wnl (5/16)  IVF: NS at 50 ml/hr  Ppx: Protonix q12h  Nutritional Goals:   RD recs 5/15:  Kcal 1750-2050/day, Protein 90-105 g/day, Fluid 1.7-2L/day  Clinimix E 5/20 at goal of + MWF only lipids  20% 240 ml = average of 1569 kcal/d and 96 g protein/day  Plan:   Continue cyclic Clinimix E 5/20 1900 mL over 14 hours  Fat emulsion at 97ml/hr over 14 hours (MWF only due to ongoing shortage).  TNA to contain standard multivitamins and trace elements (MWF only due to ongoing shortage).  CBGs and Sensitive SSI Q8h, scheduled while TNA is running  TNA lab panels on Mondays & Thursdays.  F/u daily.   Gwen Her PharmD  [phone] 05/31/2011 12:12 PM

## 2011-05-31 NOTE — Progress Notes (Signed)
Patient ID: Krystal [name], female   DOB: [birth year], 41 y.o.   MRN: [id number]  Interim Summary:  41 year old female with history of metastatic endometrial carcinoma, cycle 2 adriamycin (single agent), given 05/20/2011, recent lengthy hospitalization for bowel obstruction (requireing G tube intermittent suction) related to the recurrent cancer. Patient admitted 05/27/2011 with working diagnosis of neutropenic fever.   Consults to date:  1. Oncology (dr. Darrold Span)  2. Interventional radiology - for paracentesis  3. Physical Therapy   Assessment/Plan:   Principal Problem:   *Neutropenic fever  - secondary to immunosuppressed status secondary to advanced endometrial carcinoma - blood culture x 2 sets from 05/27/2011 negative to date; urine culture 05/27/2011 negative to date  - patient spiked fever Tmax 100.55F in last 12 hours; follow up blood cultures on 05/29/2011 are negative as well; CXR done 05/29/2011 shows Increased retrocardiac opacity could reflect worsening basilar atelectasis or infection/pneumonia. Antibiotic coverage at this time includes vancomycin and zosyn. Patient is status post recent lengthy hositalization so risk is for healthcare associated pneumonia for which reason we added Levaquin to her regimen.  - Neupogen 300 mg x 1 dose given stat on admission and we continued this dosage on daily basis per oncology  - WBC remain low at 0.5  - appreciate oncology following   Active Problems:   Partial small bowel obstruction  - Treated conservatively with bowel rest and intermittent G tube suction  - A G-tube was placed by interventional radiology for symptom management on 05/14/2011.  - TNA per pharmacy for nutritional support   Endometrial ca / Ascites  - Status post treatment with cisplatin/Adriamycin (cycle 2 given 05/20/2011)  - patient continues to complain of abdominal distention  - abdominal x ray with findings of resolving but not yet completely resolved small bowel obstruction    - status post paracentesis on previous admissions (Paracentesis done on 05/12/2011 and 05/18/2011)  - abdominal ultrasound with large volume ascites 05/28/2011  - paracentesis done 05/29/2011 with 3 liters fluid removed   Acute kidney injury  - creatinine slowly trending up from 1.06 on admission and 05/30/2011  1.61  - possibly due to ascites/? Hepatorenal syndrome  - continue to monitor renal function   Inadequate oral intake  - TNA for better nutritional support   Hypertension  - well controlled   Anemia of chronic disease  - secondary to malignancy  - hemoglobin 7.8 - no transfusion given at this time - we will continue to monitor CBC   Code Status:  - DNR   Family Communication:  - no family at bedside  Disposition Plan:  - Home with home health PT   Antibiotics:  Zosyn 05/27/2011 --->  Vancomycin 05/27/11 --->  Levaquin 05/29/2011 -->  Diflucan 05/31/2011 -->   Subjective: No events overnight.   Objective:  Vital signs in last 24 hours:  Filed Vitals:   05/30/11 2250 05/30/11 2310 05/31/11 0700 05/31/11 0800  BP:      Pulse:      Temp: 99.3 F (37.4 C) 99.2 F (37.3 C)  100.6 F (38.1 C)  TempSrc:      Resp:      Height:      Weight:   85 kg (187 lb 6.3 oz)   SpO2:        Intake/Output from previous day:   Intake/Output Summary (Last 24 hours) at 05/31/11 1024 Last data filed at 05/31/11 0615  Gross per 24 hour  Intake    730 ml  Output    501 ml  Net    229 ml    Physical Exam: General: Alert, awake, oriented x3, in no acute distress. HEENT: No bruits, no goiter. Moist mucous membranes, no scleral icterus, no conjunctival pallor. Heart: Regular rate and rhythm, S1/S2 +, no murmurs, rubs, gallops. Lungs: Clear to auscultation bilaterally. No wheezing, no rhonchi, no rales.  Abdomen: less distended and only mild tenderness to palpation but no guarding or rebound tenderness. (+) BS Extremities: No clubbing or cyanosis, no pitting edema,   positive pedal pulses. Neuro: Grossly nonfocal.  Lab Results:  Lab 05/30/11 0515 05/29/11 0420 05/28/11 0604 05/27/11 1230 05/27/11 0515  WBC 0.5* 0.4* 0.4* 0.5* 0.6*  HGB 7.8* 8.0* 8.5* 7.7* 8.3*  HCT 25.3* 26.0* 27.2* 24.4* 26.7*  PLT 86* 163 249 252 283  MCV 85.5 85.5 84.2 84.7 85.3    Lab 05/30/11 0515 05/29/11 0420 05/28/11 0604 05/27/11 1230 05/27/11 0515  NA 137 137 133* 136 136  K 3.4* 3.9 4.2 4.0 4.5  CL 102 100 97 100 100  CO2 26 25 26 28 26   GLUCOSE 130* 161* 88 79 87  BUN 24* 27* 19 27* 30*  CREATININE 1.61* 1.59* 1.21* 1.06 0.93  CALCIUM 8.6 8.8 9.0 8.3* 9.0    Lab 05/27/11 1230  INR 1.07  PROTIME --    URINE CULTURE     Status: Normal   Collection Time   05/27/11  7:00 AM      Component Value Range Status Comment   Specimen Description URINE,    Final    Colony Count 8,000 COLONIES/ML  Final    Culture INSIGNIFICANT GROWTH   Final    Report Status 05/28/2011 FINAL   Final   CULTURE, BLOOD (ROUTINE X 2)     Status: Normal (Preliminary result)   Collection Time   05/27/11  7:15 AM      Component Value Range Status Comment   Culture  Setup Time [id number]   Final    Culture     Final    Value:        BLOOD CULTURE RECEIVED NO GROWTH TO DATE    Report Status PENDING   Incomplete   CULTURE, BLOOD (ROUTINE X 2)     Status: Normal (Preliminary result)   Collection Time   05/27/11  9:25 AM      Component Value Range Status Comment   Culture  Setup Time [id number]   Final    Culture     Final    Value:        BLOOD CULTURE RECEIVED NO GROWTH TO DATE    Report Status PENDING   Incomplete   CULTURE, BLOOD (ROUTINE X 2)     Status: Normal (Preliminary result)   Collection Time   05/29/11  7:00 AM      Component Value Range Status Comment   Culture  Setup Time [id number]   Final    Culture     Final    Value:        BLOOD CULTURE RECEIVED NO GROWTH TO DATE    Report Status PENDING   Incomplete   CULTURE, BLOOD (ROUTINE X 2)     Status: Normal  (Preliminary result)   Collection Time   05/29/11  7:10 AM      Component Value Range Status Comment   Culture  Setup Time [id number]   Final    Culture     Final    Value:  BLOOD CULTURE RECEIVED NO GROWTH TO DATE    Report Status PENDING   Incomplete     Studies/Results: US Paracentesis June 23, 2011   IMPRESSION: Successful ultrasound guided therapeutic  paracentesis yielding 3.6 liters of ascites.     Medications: Scheduled Meds:   . cloNIDine  0.1 mg Transdermal Weekly  . filgrastim  480 mcg Subcutaneous Daily  . fluconazole (DIFLUCAN) IV  100 mg Intravenous Q24H  . insulin aspart  0-9 Units Subcutaneous TID  . lactulose  30 g Per Tube TID  . levofloxacin (LEVAQUIN) IV  750 mg Intravenous Q24H  . pantoprazole (PROTONIX) IV  40 mg Intravenous Q12H  . piperacillin-tazobactam (ZOSYN)  IV  3.375 g Intravenous Q8H  . vancomycin  750 mg Intravenous Q12H   Continuous Infusions:   . sodium chloride 50 mL/hr at 05/31/11 0027  . fat emulsion    . TPN (CLINIMIX) +/- additives     PRN Meds:.acetaminophen (TYLENOL) oral liquid 160 mg/5 mL, benzocaine, HYDROmorphone (DILAUDID) injection, lip balm, LORazepam, morphine, ondansetron (ZOFRAN) IV, promethazine, sodium chloride, DISCONTD: lidocaine    LOS: 4 days   Tucker Steedley 05/31/2011, 10:24 AM  [facility] Pager: [phone]

## 2011-05-31 NOTE — ED Provider Notes (Signed)
History     CSN: [id number]  Arrival date & time 05/27/11  0407   First MD Initiated Contact with Patient 05/27/11 0430      Chief Complaint  Patient presents with  . Fever  . Cough    (Consider location/radiation/quality/duration/timing/severity/associated sxs/prior treatment) HPI Fever to 101 at home today, chemo a week ago for recurrent endometrial cancer now metatstatic with recent admit for SBO requiring G tube placement.  She has persistent ABN discomfort.  Some dry cough,no dysuria. No rash or new symptoms otherwise. No n/v/d. Mod in severity. No known aggrevating or alleviating factors. Past Medical History  Diagnosis Date  . Hypertension     SINCE FIRST PREGNANCY  . Asthma   . Constipation   . History of miscarriage   . Preeclampsia   . Postoperative ileus 07/2010  . Iron deficiency anemia     s/p transfusions [facility]  . Uterine cancer   . Bowel obstruction     Past Surgical History  Procedure Date  . Cesarean section   . Abdominal hysterectomy 07-08-10     TAH-BSO, WITH OMENTECTOMY AND BILAT. PELVIC AND PERIAORTIC LYMPH NODE DISSECTION  . Appendectomy     IN CHILDHOOD  . Paracentesis 05/12/2011  . Porta cath placement     Family History  Problem Relation Age of Onset  . Diabetes Mother   . Hypertension Mother   . Hypertension Father     History  Substance Use Topics  . Smoking status: Never Smoker   . Smokeless tobacco: Never Used  . Alcohol Use: Yes     occ.    OB History    Grav Para Term Preterm Abortions TAB SAB Ect Mult Living   4 3 2 1 1  0 1   3      Review of Systems  Constitutional: Positive for fever.  HENT: Negative for neck pain and neck stiffness.   Eyes: Negative for pain.  Respiratory: Positive for cough. Negative for shortness of breath.   Cardiovascular: Negative for chest pain.  Gastrointestinal: Positive for abdominal pain.  Genitourinary: Negative for dysuria.  Musculoskeletal: Negative for back pain.  Skin: Negative for  rash.  Neurological: Negative for headaches.  All other systems reviewed and are negative.    Allergies  Review of patient's allergies indicates no known allergies.  Home Medications  No current outpatient prescriptions on file.  BP 143/95  Pulse 121  Temp(Src) 99.2 F (37.3 C) (Oral)  Resp 20  Ht 5\' 6"  (1.676 m)  Wt 189 lb (85.73 kg)  BMI 30.51 kg/m2  SpO2 96%  LMP 07/02/2010  Physical Exam  Constitutional: She is oriented to person, place, and time. She appears well-developed and well-nourished.  HENT:  Head: Normocephalic and atraumatic.  Eyes: Conjunctivae and EOM are normal. Pupils are equal, round, and reactive to light.  Neck: Trachea normal. Neck supple. No thyromegaly present.  Cardiovascular: Normal rate, regular rhythm, S1 normal, S2 normal and normal pulses.     No systolic murmur is present   No diastolic murmur is present  Pulses:      Radial pulses are 2+ on the right side, and 2+ on the left side.  Pulmonary/Chest: Effort normal and breath sounds normal. She has no wheezes. She has no rhonchi. She has no rales. She exhibits no tenderness.  Abdominal: Soft. Normal appearance. There is no CVA tenderness and negative Murphy's sign.       Mild diffuse tenderness with feeding tube in place  Musculoskeletal:  BLE:s Calves nontender, no cords or erythema, negative Homans sign  Neurological: She is alert and oriented to person, place, and time. She has normal strength. No cranial nerve deficit or sensory deficit. GCS eye subscore is 4. GCS verbal subscore is 5. GCS motor subscore is 6.  Skin: Skin is warm and dry. No rash noted. She is not diaphoretic.  Psychiatric: Her speech is normal.       Cooperative and appropriate    ED Course  Procedures (including critical care time)  Labs Reviewed  CBC - Abnormal; Notable for the following:    WBC 0.6 (*)    RBC 3.13 (*)    Hemoglobin 8.3 (*)    HCT 26.7 (*)    RDW 16.1 (*)    All other components within  normal limits  DIFFERENTIAL - Abnormal; Notable for the following:    Neutrophils Relative 0 (*)    Lymphocytes Relative 0 (*)    Monocytes Relative 0 (*)    Lymphs Abs 0.0 (*)    Monocytes Absolute 0.0 (*)    All other components within normal limits  BASIC METABOLIC PANEL - Abnormal; Notable for the following:    BUN 30 (*)    GFR calc non Af Amer 74 (*)    GFR calc Af Amer 86 (*)    All other components within normal limits  COMPREHENSIVE METABOLIC PANEL - Abnormal; Notable for the following:    BUN 27 (*)    Calcium 8.3 (*)    Albumin 2.1 (*)    Alkaline Phosphatase 123 (*)    GFR calc non Af Amer 63 (*)    GFR calc Af Amer 73 (*)    All other components within normal limits  CBC - Abnormal; Notable for the following:    WBC 0.5 (*)    RBC 2.88 (*)    Hemoglobin 7.7 (*)    HCT 24.4 (*)    RDW 16.0 (*)    All other components within normal limits  DIFFERENTIAL - Abnormal; Notable for the following:    Neutrophils Relative 0 (*)    Lymphocytes Relative 0 (*)    Monocytes Relative 0 (*)    Lymphs Abs 0.0 (*)    Monocytes Absolute 0.0 (*)    All other components within normal limits  GLUCOSE, CAPILLARY - Abnormal; Notable for the following:    Glucose-Capillary 68 (*)    All other components within normal limits  COMPREHENSIVE METABOLIC PANEL - Abnormal; Notable for the following:    Sodium 133 (*)    Creatinine, Ser 1.21 (*)    Albumin 2.3 (*)    Alkaline Phosphatase 140 (*)    GFR calc non Af Amer 54 (*)    GFR calc Af Amer 63 (*)    All other components within normal limits  PREALBUMIN - Abnormal; Notable for the following:    Prealbumin 14.5 (*)    All other components within normal limits  CBC - Abnormal; Notable for the following:    WBC 0.4 (*)    RBC 3.23 (*)    Hemoglobin 8.5 (*)    HCT 27.2 (*)    RDW 15.8 (*)    All other components within normal limits  DIFFERENTIAL - Abnormal; Notable for the following:    Neutrophils Relative 0 (*)     Lymphocytes Relative 0 (*)    Monocytes Relative 0 (*)    Lymphs Abs 0.0 (*)    Monocytes Absolute 0.0 (*)  All other components within normal limits  CBC - Abnormal; Notable for the following:    WBC 0.4 (*)    RBC 3.04 (*)    Hemoglobin 8.0 (*)    HCT 26.0 (*)    RDW 15.7 (*)    All other components within normal limits  BASIC METABOLIC PANEL - Abnormal; Notable for the following:    Glucose, Bld 161 (*)    BUN 27 (*)    Creatinine, Ser 1.59 (*)    GFR calc non Af Amer 39 (*)    GFR calc Af Amer 45 (*)    All other components within normal limits  GLUCOSE, CAPILLARY - Abnormal; Notable for the following:    Glucose-Capillary 128 (*)    All other components within normal limits  GLUCOSE, CAPILLARY - Abnormal; Notable for the following:    Glucose-Capillary 178 (*)    All other components within normal limits  GLUCOSE, CAPILLARY - Abnormal; Notable for the following:    Glucose-Capillary 171 (*)    All other components within normal limits  VANCOMYCIN, TROUGH - Abnormal; Notable for the following:    Vancomycin Tr 26.2 (*)    All other components within normal limits  GLUCOSE, CAPILLARY - Abnormal; Notable for the following:    Glucose-Capillary 103 (*)    All other components within normal limits  DIFFERENTIAL - Abnormal; Notable for the following:    Neutrophils Relative 0 (*)    Lymphocytes Relative 0 (*)    Monocytes Relative 0 (*)    Lymphs Abs 0.0 (*)    Monocytes Absolute 0.0 (*)    All other components within normal limits  CBC - Abnormal; Notable for the following:    WBC 0.5 (*)    RBC 2.96 (*)    Hemoglobin 7.8 (*)    HCT 25.3 (*)    RDW 15.7 (*)    Platelets 86 (*)    All other components within normal limits  BASIC METABOLIC PANEL - Abnormal; Notable for the following:    Potassium 3.4 (*)    Glucose, Bld 130 (*)    BUN 24 (*)    Creatinine, Ser 1.61 (*)    GFR calc non Af Amer 38 (*)    GFR calc Af Amer 44 (*)    All other components within  normal limits  GLUCOSE, CAPILLARY - Abnormal; Notable for the following:    Glucose-Capillary 111 (*)    All other components within normal limits  GLUCOSE, CAPILLARY - Abnormal; Notable for the following:    Glucose-Capillary 116 (*)    All other components within normal limits  GLUCOSE, CAPILLARY - Abnormal; Notable for the following:    Glucose-Capillary 153 (*)    All other components within normal limits  URINALYSIS, ROUTINE W REFLEX MICROSCOPIC  CULTURE, BLOOD (ROUTINE X 2)  CULTURE, BLOOD (ROUTINE X 2)  URINE CULTURE  MAGNESIUM  PHOSPHORUS  APTT  PROTIME-INR  MAGNESIUM  PHOSPHORUS  CHOLESTEROL, TOTAL  TRIGLYCERIDES  GLUCOSE, CAPILLARY  GLUCOSE, CAPILLARY  GLUCOSE, CAPILLARY  CULTURE, BLOOD (ROUTINE X 2)  CULTURE, BLOOD (ROUTINE X 2)  GLUCOSE, CAPILLARY  GLUCOSE, CAPILLARY  HIV ANTIBODY (ROUTINE TESTING)  STREP PNEUMONIAE URINARY ANTIGEN  TYPE AND SCREEN  GLUCOSE, CAPILLARY  CULTURE, EXPECTORATED SPUTUM-ASSESSMENT  GRAM STAIN  LEGIONELLA ANTIGEN, URINE  PREPARE RBC (CROSSMATCH)  DIFFERENTIAL  BASIC METABOLIC PANEL   US Paracentesis  05/29/2011  *RADIOLOGY REPORT*  Clinical Data: Metastatic endometrial carcinoma, recurrent ascites, abdominal pain; request is made for  therapeutic paracentesis.  ULTRASOUND GUIDED THERAPEUTIC  PARACENTESIS  An ultrasound guided paracentesis was thoroughly discussed with the patient and questions answered.  The benefits, risks, alternatives and complications were also discussed.  The patient understands and wishes to proceed with the procedure.  Written consent was obtained.  Ultrasound was performed to localize and mark an adequate pocket of fluid in the left lower quadrant of the abdomen.  The area was then prepped and draped in the normal sterile fashion.  1% Lidocaine was used for local anesthesia.  Under ultrasound guidance a 19 gauge Yueh catheter was introduced.  Paracentesis was performed.  The catheter was removed and a dressing  applied.  Complications:  none  Findings:  A total of approximately 3.6 liters of yellow fluid was removed.  IMPRESSION: Successful ultrasound guided therapeutic  paracentesis yielding 3.6 liters of ascites.  Read by: Jeananne Rama, P.A.-C  Original Report Authenticated By: Reola Calkins, M.D.   Dg Chest Port 1v Same Day  05/29/2011  *RADIOLOGY REPORT*  Clinical Data: 41 year old female with neutropenic fever. Endometrial carcinoma.  PORTABLE CHEST - 1 VIEW SAME DAY  Comparison: 05/27/2011 and earlier.  Findings: Portable semi upright AP view of the chest at 0909 hours. Continued low lung volumes.  Stable right chest Port-A-Cath.  Right PICC line now in place.  Increased retrocardiac opacity.  Left of the plate like atelectasis at both lung bases.  Stable cardiac size and mediastinal contours.  Visualized tracheal air column is within normal limits.  No pneumothorax, pulmonary edema or large effusion.  IMPRESSION: Increased retrocardiac opacity could reflect worsening basilar atelectasis or infection/pneumonia.  Original Report Authenticated By: Harley Hallmark, M.D.     1. Neutropenic fever   2. Endometrial cancer   3. Endometrial ca   4. Malignant neoplasm of body of uterus     ivfs and labs obtained as above.  I spoke with [facility] hospitalist on call, Dr Kaylyn Layer - he will admit PT for nuetropenic fever.  PT had been placed on neutropenic precautions when labs resulted.   MDM    Neutropenic fever on precautions. Broad spectrum ABx intiated for fever no obvious source. MEd admit for further work up and evaluation.       Sunnie Nielsen, MD 05/31/11 [phone]

## 2011-05-31 NOTE — Progress Notes (Signed)
IP PROGRESS NOTE  Subjective:   She has nausea this morning. Soreness at the left side of the tongue.  Objective: Vital signs in last 24 hours: Blood pressure 143/95, pulse 121, temperature 99.2 F (37.3 C), temperature source Oral, resp. rate 20, height 5\' 6"  (1.676 m), weight 187 lb 6.3 oz (85 kg), last menstrual period 07/02/2010, SpO2 96.00%.  Intake/Output from previous day: 05/18 0701 - 05/19 0700 In: 730 [P.O.:480; I.V.:250] Out: 501 [Urine:500; Stool:1]  Physical Exam:  HEENT: No thrush, there is an ulcer at the left side of the tongue. No other ulcers. Lungs: Clear bilaterally Cardiac: Regular rate and rhythm, tachycardia Abdomen: Mildly distended, nontender, gastrostomy tube in place-no evidence of infection Extremities: No leg edema   Portacath/PICC-without erythema  Lab Results:  Basename 05/30/11 0515 05/29/11 0420  WBC 0.5* 0.4*  HGB 7.8* 8.0*  HCT 25.3* 26.0*  PLT 86* 163    BMET  Basename 05/30/11 0515 05/29/11 0420  NA 137 137  K 3.4* 3.9  CL 102 100  CO2 26 25  GLUCOSE 130* 161*  BUN 24* 27*  CREATININE 1.61* 1.59*  CALCIUM 8.6 8.8    Studies/Results: US Paracentesis  05/29/2011  *RADIOLOGY REPORT*  Clinical Data: Metastatic endometrial carcinoma, recurrent ascites, abdominal pain; request is made for therapeutic paracentesis.  ULTRASOUND GUIDED THERAPEUTIC  PARACENTESIS  An ultrasound guided paracentesis was thoroughly discussed with the patient and questions answered.  The benefits, risks, alternatives and complications were also discussed.  The patient understands and wishes to proceed with the procedure.  Written consent was obtained.  Ultrasound was performed to localize and mark an adequate pocket of fluid in the left lower quadrant of the abdomen.  The area was then prepped and draped in the normal sterile fashion.  1% Lidocaine was used for local anesthesia.  Under ultrasound guidance a 19 gauge Yueh catheter was introduced.  Paracentesis  was performed.  The catheter was removed and a dressing applied.  Complications:  none  Findings:  A total of approximately 3.6 liters of yellow fluid was removed.  IMPRESSION: Successful ultrasound guided therapeutic  paracentesis yielding 3.6 liters of ascites.  Read by: Jeananne Rama, P.A.-C  Original Report Authenticated By: Reola Calkins, M.D.   Dg Chest Port 1v Same Day  05/29/2011  *RADIOLOGY REPORT*  Clinical Data: 41 year old female with neutropenic fever. Endometrial carcinoma.  PORTABLE CHEST - 1 VIEW SAME DAY  Comparison: 05/27/2011 and earlier.  Findings: Portable semi upright AP view of the chest at 0909 hours. Continued low lung volumes.  Stable right chest Port-A-Cath.  Right PICC line now in place.  Increased retrocardiac opacity.  Left of the plate like atelectasis at both lung bases.  Stable cardiac size and mediastinal contours.  Visualized tracheal air column is within normal limits.  No pneumothorax, pulmonary edema or large effusion.  IMPRESSION: Increased retrocardiac opacity could reflect worsening basilar atelectasis or infection/pneumonia.  Original Report Authenticated By: Harley Hallmark, M.D.    Medications: I have reviewed the patient's current medications.  Assessment/Plan:  1. Metastatic high-grade endometrial carcinoma, status post Adriamycin 05/20/2011  2. Pancytopenia secondary to chemotherapy-maintained on G-CSF support  3. Febrile neutropenia-the fever persists on broad-spectrum antibiotic support. No apparent source for infection. Cultures remain negative. Pneumonia was questioned on a portable chest x-ray 05/29/2011. We will repeat a chest x-ray today. Add Diflucan. 4. Nutrition-maintained on TNA 5. Tongue ulcer secondary to chemotherapy-try Oragel 6. Elevated creatinine-? Intravascular depletion   Recommendations  1. Continue broad-spectrum antibiotic  support, add Diflucan 2. Continue G-CSF 3. Follow daily CBC 4. Chest x-ray  LOS: 4 days    Katieann Hungate  05/31/2011, 8:13 AM

## 2011-06-01 ENCOUNTER — Other Ambulatory Visit: Payer: Self-pay | Admitting: Certified Registered Nurse Anesthetist

## 2011-06-01 ENCOUNTER — Inpatient Hospital Stay (HOSPITAL_COMMUNITY): Payer: Medicaid Other

## 2011-06-01 ENCOUNTER — Telehealth: Payer: Self-pay

## 2011-06-01 DIAGNOSIS — N8502 Endometrial intraepithelial neoplasia [EIN]: Secondary | ICD-10-CM

## 2011-06-01 DIAGNOSIS — R1033 Periumbilical pain: Secondary | ICD-10-CM

## 2011-06-01 DIAGNOSIS — R509 Fever, unspecified: Secondary | ICD-10-CM

## 2011-06-01 DIAGNOSIS — R188 Other ascites: Secondary | ICD-10-CM

## 2011-06-01 LAB — CBC
MCH: 26.2 pg (ref 26.0–34.0)
Platelets: 93 10*3/uL — ABNORMAL LOW (ref 150–400)
RBC: 2.9 MIL/uL — ABNORMAL LOW (ref 3.87–5.11)
WBC: 2.8 10*3/uL — ABNORMAL LOW (ref 4.0–10.5)

## 2011-06-01 LAB — DIFFERENTIAL
Basophils Absolute: 0 10*3/uL (ref 0.0–0.1)
Basophils Relative: 0 % (ref 0–1)
Eosinophils Absolute: 0 10*3/uL (ref 0.0–0.7)
Monocytes Absolute: 0.5 10*3/uL (ref 0.1–1.0)
Neutro Abs: 1.7 10*3/uL (ref 1.7–7.7)
Neutrophils Relative %: 61 % (ref 43–77)

## 2011-06-01 LAB — COMPREHENSIVE METABOLIC PANEL
Alkaline Phosphatase: 91 U/L (ref 39–117)
BUN: 29 mg/dL — ABNORMAL HIGH (ref 6–23)
Calcium: 8.5 mg/dL (ref 8.4–10.5)
Creatinine, Ser: 1.55 mg/dL — ABNORMAL HIGH (ref 0.50–1.10)
GFR calc Af Amer: 46 mL/min — ABNORMAL LOW (ref >90)
Glucose, Bld: 117 mg/dL — ABNORMAL HIGH (ref 70–99)
Total Protein: 6 g/dL (ref 6.0–8.3)

## 2011-06-01 LAB — MAGNESIUM: Magnesium: 1.7 mg/dL (ref 1.5–2.5)

## 2011-06-01 LAB — GLUCOSE, CAPILLARY

## 2011-06-01 LAB — TRIGLYCERIDES: Triglycerides: 108 mg/dL (ref <150)

## 2011-06-01 LAB — CHOLESTEROL, TOTAL: Cholesterol: 138 mg/dL (ref 0–200)

## 2011-06-01 LAB — PHOSPHORUS: Phosphorus: 3 mg/dL (ref 2.3–4.6)

## 2011-06-01 MED ORDER — GUAIFENESIN-DM 100-10 MG/5ML PO SYRP
5.0000 mL | ORAL_SOLUTION | ORAL | Status: DC | PRN
  Administered 2011-06-01 – 2011-06-05 (×9): 5 mL via ORAL
  Filled 2011-06-01 (×9): qty 10

## 2011-06-01 MED ORDER — ALBUTEROL SULFATE (5 MG/ML) 0.5% IN NEBU
2.5000 mg | INHALATION_SOLUTION | RESPIRATORY_TRACT | Status: DC | PRN
  Administered 2011-06-01 – 2011-06-04 (×12): 2.5 mg via RESPIRATORY_TRACT
  Filled 2011-06-01 (×10): qty 0.5

## 2011-06-01 MED ORDER — BENZONATATE 100 MG PO CAPS
100.0000 mg | ORAL_CAPSULE | Freq: Three times a day (TID) | ORAL | Status: DC | PRN
  Administered 2011-06-01: 100 mg via ORAL
  Filled 2011-06-01: qty 1

## 2011-06-01 MED ORDER — TRACE MINERALS CR-CU-MN-SE-ZN 10-1000-500-60 MCG/ML IV SOLN
INTRAVENOUS | Status: AC
  Administered 2011-06-01: 18:00:00 via INTRAVENOUS
  Filled 2011-06-01: qty 2000

## 2011-06-01 MED ORDER — ALBUTEROL SULFATE (5 MG/ML) 0.5% IN NEBU
2.5000 mg | INHALATION_SOLUTION | Freq: Four times a day (QID) | RESPIRATORY_TRACT | Status: DC | PRN
  Administered 2011-06-01: 2.5 mg via RESPIRATORY_TRACT
  Filled 2011-06-01 (×2): qty 0.5

## 2011-06-01 MED ORDER — FAT EMULSION 20 % IV EMUL
250.0000 mL | INTRAVENOUS | Status: DC
  Filled 2011-06-01: qty 250

## 2011-06-01 MED ORDER — ACETAMINOPHEN 325 MG PO TABS
650.0000 mg | ORAL_TABLET | Freq: Once | ORAL | Status: AC
  Administered 2011-06-01: 650 mg via ORAL
  Filled 2011-06-01: qty 2

## 2011-06-01 MED ORDER — HYDROCOD POLST-CHLORPHEN POLST 10-8 MG/5ML PO LQCR: 5.0000 mL | Freq: Every evening | ORAL | Status: DC | PRN

## 2011-06-01 MED ORDER — HYDROCOD POLST-CHLORPHEN POLST 10-8 MG/5ML PO LQCR
5.0000 mL | Freq: Two times a day (BID) | ORAL | Status: DC | PRN
  Administered 2011-06-01 – 2011-06-06 (×5): 5 mL via ORAL
  Filled 2011-06-01 (×5): qty 5

## 2011-06-01 MED ORDER — FAT EMULSION 20 % IV EMUL
240.0000 mL | INTRAVENOUS | Status: AC
  Administered 2011-06-01: 240 mL via INTRAVENOUS
  Filled 2011-06-01 (×2): qty 250

## 2011-06-01 MED ORDER — DIPHENHYDRAMINE HCL 50 MG/ML IJ SOLN
25.0000 mg | Freq: Once | INTRAMUSCULAR | Status: AC
  Administered 2011-06-01: 25 mg via INTRAVENOUS
  Filled 2011-06-01: qty 1

## 2011-06-01 MED ORDER — ALTEPLASE 2 MG IJ SOLR
2.0000 mg | Freq: Once | INTRAMUSCULAR | Status: AC
  Administered 2011-06-01: 2 mg
  Filled 2011-06-01: qty 2

## 2011-06-01 NOTE — Telephone Encounter (Signed)
Faxed signed orders dated 06-01-11 to [facility].  Sent a copy to HIM to be scanned onto Pt.'s EMR.

## 2011-06-01 NOTE — Progress Notes (Addendum)
Patient ID: Krystal [name], female   DOB: Jan 26, 1970, 41 y.o.   MRN: [id number]  Interim Summary:  41 year old female with history of metastatic endometrial carcinoma, cycle 2 adriamycin (single agent), given 05/20/2011, recent lengthy hospitalization for bowel obstruction (requireing G tube intermittent suction) related to the recurrent cancer. Patient admitted 05/27/2011 with working diagnosis of neutropenic fever.   Consults to date:  1. Oncology (dr. Darrold Span)  2. Interventional radiology - for paracentesis  3. Physical Therapy   Assessment/Plan:   Principal Problem:   *Neutropenic fever  - secondary to immunosuppressed status secondary to advanced endometrial carcinoma  - blood culture x 2 sets and urine culture from 05/27/2011 and 05/29/2011 negative to date - CXR done 05/29/2011 shows Increased retrocardiac opacity could reflect worsening basilar atelectasis or infection/pneumonia.  - Antibiotic coverage at this time includes vancomycin and zosyn. Patient is status post recent lengthy hositalization so risk is for healthcare associated pneumonia for which reason we added Levaquin to her regimen.  - Neupogen 300 mg x 1 dose given stat on admission and we continued this dosage on daily basis per oncology; 06/01/2011 WBC recovered to 2.8 and per oncology Neupogen discontinued - appreciate oncology following   Active Problems:   Cough - perhaps secondary to pneumonia although not entirely clear why she has and her lung exam is normal - we will follow up on CXR this am - will give her Tussirex PRN  Partial small bowel obstruction  - Treated conservatively with bowel rest and intermittent G tube suction  - A G-tube was placed by interventional radiology for symptom management on 05/14/2011.  - TNA per pharmacy for nutritional support   Endometrial ca / Ascites  - Status post treatment with cisplatin/Adriamycin (cycle 2 given 05/20/2011)  - patient continues to complain of abdominal distention   - abdominal x ray with findings of resolving but not yet completely resolved small bowel obstruction  - status post paracentesis on previous admissions (Paracentesis done on 05/12/2011 and 05/18/2011)  - abdominal ultrasound with large volume ascites 05/28/2011  - paracentesis done 05/29/2011 with 3 liters fluid removed   Acute kidney injury  - creatinine slowly trended up from 1.06 on admission and 06/01/2011 creatinine 1.55 - possibly due to ascites/? Hepatorenal syndrome  - continue to monitor renal function   Inadequate oral intake  - TNA for better nutritional support   Hypertension  - well controlled; BP 118/85  Anemia of chronic disease  - secondary to malignancy  - hemoglobin 7.6 - will transfuse 1 unit of PRBCs today - we will continue to monitor CBC   Code Status:  - DNR   Family Communication:  - no family at bedside   Disposition Plan:  - Home with home health PT   Antibiotics:  Zosyn 05/27/2011 --->  Vancomycin 05/27/11 --->  Levaquin 05/29/2011 -->  Diflucan 05/31/2011 -->     Subjective: No events overnight. Patient did not sleep well due cough last night.  Objective:  Vital signs in last 24 hours:   05/31/11 2134 06/01/11 0517  BP: 126/90 118/85  Pulse: 116 122  Temp: 100 F (37.8 C) 101.4 F (38.6 C)  TempSrc: Oral Oral  Resp: 20 22  SpO2: 99% 96%    Intake/Output from previous day:  Intake/Output Summary (Last 24 hours) at 06/01/11 0917 Last data filed at 06/01/11 0600  Gross per 24 hour  Intake 7708.33 ml  Output     12 ml  Net 7696.33 ml  Physical Exam: General: Alert, awake, oriented x3, in no acute distress. HEENT: No bruits, no goiter. Moist mucous membranes, no scleral icterus, no conjunctival pallor. Heart: Regular rate and rhythm, S1/S2 +, no murmurs, rubs, gallops. Lungs: Clear to auscultation bilaterally. No wheezing, no rhonchi, no rales.  Abdomen: Soft, some distention,positive bowel sounds. Extremities: No clubbing or  cyanosis, no pitting edema,  positive pedal pulses. Neuro: Grossly nonfocal.  Lab Results:  Lab 06/01/11 0455 06/22/11 1223 05/30/11 0515 05/29/11 0420 05/28/11 0604  WBC 2.8* 1.4* 0.5* 0.4* 0.4*  HGB 7.6* 8.2* 7.8* 8.0* 8.5*  HCT 24.6* 25.6* 25.3* 26.0* 27.2*  PLT 93* 86* 86* 163 249  MCV 84.8 83.9 85.5 85.5 84.2    Lab 06/01/11 0455 06/22/2011 1223 05/30/11 0515 05/29/11 0420 05/28/11 0604  NA 138 137 137 137 133*  K 3.7 3.9 3.4* 3.9 4.2  CL 104 102 102 100 97  CO2 24 24 26 25 26   GLUCOSE 117* 97 130* 161* 88  BUN 29* 28* 24* 27* 19  CREATININE 1.55* 1.54* 1.61* 1.59* 1.21*  CALCIUM 8.5 8.4 8.6 8.8 9.0  MG 1.7 -- -- -- 2.1    Lab 05/27/11 1230  INR 1.07  PROTIME --   URINE CULTURE     Status: Normal   Collection Time   05/27/11  7:00 AM      Component Value Range Status Comment   Specimen Description URINE,  Final    Colony Count 8,000 COLONIES/ML   Final    Culture INSIGNIFICANT GROWTH   Final    Report Status 05/28/2011 FINAL   Final   CULTURE, BLOOD (ROUTINE X 2)     Status: Normal (Preliminary result)   Collection Time   05/27/11  7:15 AM      Component Value Range Status Comment   Culture     Final    Value:        BLOOD CULTURE RECEIVED NO GROWTH TO DATE    Report Status PENDING   Incomplete   CULTURE, BLOOD (ROUTINE X 2)     Status: Normal (Preliminary result)   Collection Time   05/27/11  9:25 AM      Component Value Range Status Comment   Culture     Final    Value:        BLOOD CULTURE RECEIVED NO GROWTH TO DATE   Report Status PENDING   Incomplete   CULTURE, BLOOD (ROUTINE X 2)     Status: Normal (Preliminary result)   Collection Time   05/29/11  7:00 AM      Component Value Range Status Comment   Culture     Final    Value:        BLOOD CULTURE RECEIVED NO GROWTH TO DATE    Report Status PENDING   Incomplete   CULTURE, BLOOD (ROUTINE X 2)     Status: Normal (Preliminary result)   Collection Time   05/29/11  7:10 AM      Component Value Range Status  Comment   Culture     Final    Value:        BLOOD CULTURE RECEIVED NO GROWTH TO DATE    Report Status PENDING   Incomplete     Studies/Results: Dg Chest 2 View 06-22-2011    IMPRESSION: Mild bibasilar airspace disease is present which may represent atelectasis or pneumonia.  No significant interval change.     Medications: Scheduled Meds:   . acetaminophen  650 mg  Oral Once  . cloNIDine  0.1 mg Transdermal Weekly  . diphenhydrAMINE  25 mg Intravenous Once  . fluconazole (DIFLUCAN) IV  100 mg Intravenous Q24H  . insulin aspart  0-9 Units Subcutaneous TID  . lactulose  30 g Per Tube TID  . levofloxacin (LEVAQUIN) IV  750 mg Intravenous Q24H  . pantoprazole (PROTONIX) IV  40 mg Intravenous Q12H  . piperacillin-tazobactam (ZOSYN)  IV  3.375 g Intravenous Q8H  . vancomycin  750 mg Intravenous Q12H   Continuous Infusions:   . sodium chloride 50 mL/hr at 06/01/11 0814  . TPN (CLINIMIX) +/- additives Stopped (06/01/11 0815)   PRN Meds:.acetaminophen (TYLENOL) oral liquid 160 mg/5 mL, albuterol, benzocaine, benzonatate, chlorpheniramine-HYDROcodone, guaiFENesin-dextromethorphan, HYDROmorphone (DILAUDID) injection, lip balm, LORazepam, morphine, ondansetron (ZOFRAN) IV, promethazine, sodium chloride, DISCONTD: benzonatate, DISCONTD: chlorpheniramine-HYDROcodone, DISCONTD: guaiFENesin   LOS: 5 days   Chrystopher Stangl 06/01/2011, 9:17 AM  [facility] Pager: [phone]

## 2011-06-01 NOTE — Telephone Encounter (Signed)
Spoke with [name] and gave order for [name]'s breathing treatments to be increased to q 4 hours from q 6 hrs per Dr. Darrold Span. Treatment has helped with her congestion.

## 2011-06-01 NOTE — Progress Notes (Signed)
PT Cancellation Note  Treatment cancelled today due to pt defers walking right now.  She states she has been getting up in the room and walking in the evenings when she feels better.  Nursing confirms pt is getting up on her own.  Recommend pt continue to increase mobility ad lib.  We will check back one more time on Weds/Thurs   If pt is still doing OK will discontinue PT attempts.  Krystal, [name] [facility] 06/01/2011, 2:54 PM

## 2011-06-01 NOTE — Progress Notes (Signed)
  06/01/2011, 8:31 AM  Hospital day: 6 Antibiotics: day 5 vanc/zosyn, day 4 levaquin, day 2 diflucan Chemotherapy: day 13 cycle 2 adriamycin Neupogen DCd now   Subjective: Nonproductive cough unless she stays very still at 45 degrees, to point that she vomits. Denies shortness of breath. Bowels moved large amounts x 3 yesterday! Ulcer on side of tongue a little better. Did not sleep last pm due to cough. Some leakage around G tube yesterday, none today. Walked in hall this weekend. Mr.Ales and mother here. Objective: Vital signs in last 24 hours: Blood pressure 118/85, pulse 122, temperature 99.3 F (37.4 C), temperature source Oral, resp. rate 22, height 5\' 6"  (1.676 m), weight 187 lb 6.3 oz (85 kg), last menstrual period 07/02/2010, SpO2 96.00%. T max 101.4   Intake/Output from previous day: 05/19 0701 - 05/20 0700 In: 7708.3 [P.O.:240; I.V.:800; IV Piggyback:650; TPN:6018.3] Out: 12 [Urine:7; Stool:5] Intake/Output this shift:    Physical exam: Awake, alert, trying not to cough, changes position without assistance.Ulcer on side of tongue only, no obvious thrush. No JVD. Diminished breath sounds bases posteriorly, no wheeze or rales. Cor RRR. PAC site ok. PICC. G tube dressing dry. Abdomen not tight, not tender. LE no pitting edema or cords.  Lab Results:  Basename 06/01/11 0455 05/31/11 1223  WBC 2.8* 1.4*  HGB 7.6* 8.2*  HCT 24.6* 25.6*  PLT 93* 86*  ANC up to 1.7 BMET  Basename 06/01/11 0455 05/31/11 1223  NA 138 137  K 3.7 3.9  CL 104 102  CO2 24 24  GLUCOSE 117* 97  BUN 29* 28*  CREATININE 1.55* 1.54*  CALCIUM 8.5 8.4  alb 1.8, T prot 6.0  No positive blood or urine cultures this admission that I can find Studies/Results: Dg Chest 2 View  05/31/2011  *RADIOLOGY REPORT*  Clinical Data: Fever, rule out pneumonia  CHEST - 2 VIEW  Comparison: 05/29/2011  Findings: Mild bibasilar airspace disease is unchanged.  Negative for heart failure.  Right arm PICC tip in  the SVC at the cavoatrial junction.  Right sided Port-A-Cath tip in the SVC.  These are unchanged.  IMPRESSION: Mild bibasilar airspace disease is present which may represent atelectasis or pneumonia.  No significant interval change.  Original Report Authenticated By: Camelia Phenes, M.D.   Repeat CXR ordered now DIscussed orders with RN. Assessment/Plan: 1.Neutropenia from recent chemotherapy resolved. Will stop neupogen. Hopefully can work back on antibiotics soon, possibly starting with vancomycin. 2.symptomatic anemia: will give 1 unit PRBCs 3.recurrent endometrial carcinoma: DNR but full support to that point including plans for at least 1 more cycle of chemotherapy. 4. Bowel obstruction: may be improving, with 3 large BMs in last 24 hours. COntinue lactulose per Gtube tid unless watery stools. 5.poor nutritional status/ inability to take po's yet, on TNA 6.cough: will try neb and tessalon, recheck CXR particularly with consideration of possibly decreasing antibiotics 7. PAC, PICC, G tube   Gertrude Bucks P

## 2011-06-01 NOTE — Progress Notes (Signed)
PARENTERAL NUTRITION CONSULT NOTE - Follow up  Pharmacy Consult for TNA Indication: Hx of pSBO  No Known Allergies  Patient Measurements: Height: 5\' 6"  (167.6 cm) Weight: 187 lb 6.3 oz (85 kg) IBW/kg (Calculated) : 59.3   Vital Signs: Temp: 98.6 F (37 C) (05/20 1105) Temp src: Oral (05/20 1105) BP: 107/70 mmHg (05/20 1105) Pulse Rate: 113  (05/20 1105) Intake/Output from previous day: 05/19 0701 - 05/20 0700 In: 7708.3 [P.O.:240; I.V.:800; IV Piggyback:650; TPN:6018.3] Out: 12 [Urine:7; Stool:5]  Labs:  [facility] 06/01/11 0455 05/31/11 1223 05/30/11 0515  WBC 2.8* 1.4* 0.5*  HGB 7.6* 8.2* 7.8*  HCT 24.6* 25.6* 25.3*  PLT 93* 86* 86*  APTT -- -- --  INR -- -- --     Basename 06/01/11 0455 05/31/11 1223 05/30/11 0515  NA 138 137 137  K 3.7 3.9 3.4*  CL 104 102 102  CO2 24 24 26   GLUCOSE 117* 97 130*  BUN 29* 28* 24*  CREATININE 1.55* 1.54* 1.61*  LABCREA -- -- --  CREAT24HRUR -- -- --  CALCIUM 8.5 8.4 8.6  MG 1.7 -- --  PHOS 3.0 -- --  PROT 6.0 -- --  ALBUMIN 1.8* -- --  AST 13 -- --  ALT 10 -- --  ALKPHOS 91 -- --  BILITOT 0.3 -- --  BILIDIR -- -- --  IBILI -- -- --  PREALBUMIN -- -- --  TRIG 108 -- --  CHOLHDL -- -- --  CHOL 138 -- --   Estimated Creatinine Clearance: 51.4 ml/min (by C-G formula based on Cr of 1.55).    Basename 06/01/11 0612 06/01/11 0008 05/31/11 1816  GLUCAP 159* 149* 106*   Medications:  Scheduled:     . acetaminophen  650 mg Oral Once  . cloNIDine  0.1 mg Transdermal Weekly  . diphenhydrAMINE  25 mg Intravenous Once  . fluconazole (DIFLUCAN) IV  100 mg Intravenous Q24H  . insulin aspart  0-9 Units Subcutaneous TID  . lactulose  30 g Per Tube TID  . levofloxacin (LEVAQUIN) IV  750 mg Intravenous Q24H  . pantoprazole (PROTONIX) IV  40 mg Intravenous Q12H  . piperacillin-tazobactam (ZOSYN)  IV  3.375 g Intravenous Q8H  . sodium chloride  3 mL Intravenous Q12H  . vancomycin  750 mg Intravenous Q12H  . DISCONTD:  filgrastim  480 mcg Subcutaneous Daily  . DISCONTD: guaiFENesin  600 mg Oral BID   Infusions:     . sodium chloride 50 mL/hr at 06/01/11 0814  . TPN (CLINIMIX) +/- additives     And  . fat emulsion    . TPN [facility]) +/- additives Stopped (06/01/11 0815)   Insulin Requirements in the past 24 hours:  CBGs: 106-164 Insulin: 3 units  Current Nutrition:  TNA: Clinimix E 5/20 cyclic over 14 hours (50 ml/hr from 6pm-7pm, 150 ml/hr from 7pm-7am, and 50 ml/hr from 7am-8am.  Off from 8am until 6pm) Diet: Regular; poor po intake.  Assessment:  41YOF w/TNA started during previous admission (4/25) for partial SBO (resolving per Xray 5/15). Changed to cyclic at the end of previous admission and d/c'd on cyclic TNA. Pt readmitted 5/15 and cyclic TNA resumed  Electrolytes:  WNL. Scr continues ~ 1.5  CBG within goal < 150  Albumin low, 2.3 (5/16), AST/ALT wnl (5/16)  IVF: NS at 50 ml/hr  Ppx: Protonix q12h  Nutritional Goals:   RD recs 5/15:  Kcal 1750-2050/day, Protein 90-105 g/day, Fluid 1.7-2L/day  Clinimix E 5/20 at goal of +  MWF only lipids  20% 240 ml = average of 1569 kcal/d and 96 g protein/day  Plan:   Continue cyclic Clinimix E 5/20 1900 mL over 14 hours  No reduction in rate to reduce protein load (acute reaction)  Fat emulsion at 93ml/hr over 14 hours (MWF only due to ongoing shortage).  TNA to contain standard multivitamins and trace elements (MWF only due to ongoing shortage).  CBGs and Sensitive SSI Q8h, scheduled while TNA is running  TNA lab panels on Mondays & Thursdays.  F/u daily.   Otho Bellows PharmD  06/01/2011 11:44 AM

## 2011-06-02 ENCOUNTER — Encounter (HOSPITAL_COMMUNITY): Payer: Self-pay | Admitting: Radiology

## 2011-06-02 ENCOUNTER — Inpatient Hospital Stay (HOSPITAL_COMMUNITY): Payer: Medicaid Other

## 2011-06-02 DIAGNOSIS — R188 Other ascites: Secondary | ICD-10-CM

## 2011-06-02 DIAGNOSIS — R1033 Periumbilical pain: Secondary | ICD-10-CM

## 2011-06-02 DIAGNOSIS — R509 Fever, unspecified: Secondary | ICD-10-CM

## 2011-06-02 DIAGNOSIS — N8502 Endometrial intraepithelial neoplasia [EIN]: Secondary | ICD-10-CM

## 2011-06-02 LAB — TYPE AND SCREEN
Antibody Screen: NEGATIVE
Unit division: 0
Unit division: 0

## 2011-06-02 LAB — DIFFERENTIAL
Basophils Relative: 1 % (ref 0–1)
Eosinophils Relative: 0 % (ref 0–5)
Lymphs Abs: 0.7 10*3/uL (ref 0.7–4.0)
Monocytes Absolute: 0.8 10*3/uL (ref 0.1–1.0)

## 2011-06-02 LAB — CBC
HCT: 26.8 % — ABNORMAL LOW (ref 36.0–46.0)
Hemoglobin: 8.4 g/dL — ABNORMAL LOW (ref 12.0–15.0)
MCH: 26.8 pg (ref 26.0–34.0)
MCHC: 31.3 g/dL (ref 30.0–36.0)
RBC: 3.13 MIL/uL — ABNORMAL LOW (ref 3.87–5.11)

## 2011-06-02 LAB — GLUCOSE, CAPILLARY
Glucose-Capillary: 173 mg/dL — ABNORMAL HIGH (ref 70–99)
Glucose-Capillary: 72 mg/dL (ref 70–99)

## 2011-06-02 LAB — CULTURE, BLOOD (ROUTINE X 2): Culture  Setup Time: 201305151224

## 2011-06-02 MED ORDER — ENSURE COMPLETE PO LIQD
237.0000 mL | Freq: Two times a day (BID) | ORAL | Status: DC
  Administered 2011-06-04 – 2011-06-06 (×3): 237 mL via ORAL

## 2011-06-02 MED ORDER — ENOXAPARIN SODIUM 40 MG/0.4ML ~~LOC~~ SOLN
40.0000 mg | SUBCUTANEOUS | Status: DC
  Administered 2011-06-02 – 2011-06-06 (×4): 40 mg via SUBCUTANEOUS
  Filled 2011-06-02 (×5): qty 0.4

## 2011-06-02 MED ORDER — CLINIMIX E/DEXTROSE (5/20) 5 % IV SOLN
INTRAVENOUS | Status: AC
  Administered 2011-06-02: 19:00:00 via INTRAVENOUS
  Filled 2011-06-02: qty 2000

## 2011-06-02 MED ORDER — IOHEXOL 300 MG/ML  SOLN
100.0000 mL | Freq: Once | INTRAMUSCULAR | Status: AC | PRN
  Administered 2011-06-02: 100 mL via INTRAVENOUS

## 2011-06-02 NOTE — Progress Notes (Signed)
Nutrition Follow-up  Diet Order: Full liquid  TNA: Clinimix E 5/20 cyclic rate; 6p-7p 17ml/hr, 9U-0A 150 ml/hr, 7a-8a 79ml/hr. TNA off 8a-6p with 20% fat emulsions running at 51ml/hr for 14 hours (MWF only due to ongoing shortage). Provides 1896 calories and 96 gram protein daily (based on weekly average). Meets 108% minimum estimated kcal and 107% minimum estimated protein needs.  Additional IVF with NS @ 50 ml/hr.  - Noted events of the past week with pt c/o abdominal pain with distention and MD discussed possibility of abdominal infection. Pt with persistent fevers. Pt had 3.6 liters of yellow fluid removed via paracentesis on 5/17. Pt developed ulcer on the left side of the tongue on 5/18 - Oragel was ordered. Met with pt who reports eating a little bit of breakfast this morning, denied any nausea, and reports improvement in mouth sore. Pt requested Ensure, which was given.   Meds: Scheduled Meds:   . alteplase  2 mg Intracatheter Once  . cloNIDine  0.1 mg Transdermal Weekly  . enoxaparin  40 mg Subcutaneous Q24H  . fluconazole (DIFLUCAN) IV  100 mg Intravenous Q24H  . insulin aspart  0-9 Units Subcutaneous TID  . lactulose  30 g Per Tube TID  . pantoprazole (PROTONIX) IV  40 mg Intravenous Q12H  . piperacillin-tazobactam (ZOSYN)  IV  3.375 g Intravenous Q8H  . sodium chloride  3 mL Intravenous Q12H  . vancomycin  750 mg Intravenous Q12H   Continuous Infusions:   . sodium chloride 50 mL/hr at 06/01/11 0814  . fat emulsion 240 mL (06/01/11 1800)  . TPN (CLINIMIX) +/- additives    . TPN (CLINIMIX) +/- additives    . DISCONTD: fat emulsion     PRN Meds:.acetaminophen (TYLENOL) oral liquid 160 mg/5 mL, albuterol, benzocaine, benzonatate, chlorpheniramine-HYDROcodone, guaiFENesin-dextromethorphan, HYDROmorphone (DILAUDID) injection, iohexol, lip balm, LORazepam, morphine, ondansetron (ZOFRAN) IV, promethazine, sodium chloride, DISCONTD: albuterol  Labs:  CMP     Component Value  Date/Time   NA 138 06/01/2011 0455   K 3.7 06/01/2011 0455   CL 104 06/01/2011 0455   CO2 24 06/01/2011 0455   GLUCOSE 117* 06/01/2011 0455   BUN 29* 06/01/2011 0455   CREATININE 1.55* 06/01/2011 0455   CREATININE 0.55 10/17/2009 1300   CALCIUM 8.5 06/01/2011 0455   PROT 6.0 06/01/2011 0455   ALBUMIN 1.8* 06/01/2011 0455   AST 13 06/01/2011 0455   ALT 10 06/01/2011 0455   ALKPHOS 91 06/01/2011 0455   BILITOT 0.3 06/01/2011 0455   GFRNONAA 40* 06/01/2011 0455   GFRAA 46* 06/01/2011 0455   CBG (last 3)   Basename 06/02/11 0537 06/02/11 0003 06/01/11 1750  GLUCAP 173* 129* 79   - PALB down from 14.5 mg/dL on 5/40 to 9.1 mg/dL on 9/81 - CBGs slightly elevated above >150 mg/dL range   Intake/Output Summary (Last 24 hours) at 06/02/11 1206 Last data filed at 06/02/11 0600  Gross per 24 hour  Intake   4227 ml  Output    350 ml  Net   3877 ml   Last BM - 06/01/11 G tube drainage - total yesterday  Weight Status:   5/15 194 lb 5/19 187 lb 63 oz  Re-estimated needs: No changes. 1750-2050 calories 90-105 g protein  Nutrition Dx: Inadequate oral intake - ongoing  Goal: TNA to meet >90% of estimated needs - met  Intervention: TNA per pharmacy. Ensure Complete BID.   Monitor: Weights, labs, intake, TNA, nausea, mouth sore, BM   Marshall Cork  Pager #: [phone]

## 2011-06-02 NOTE — Progress Notes (Signed)
UR complete 

## 2011-06-02 NOTE — Plan of Care (Signed)
Problem: Inadequate Intake (NI-2.1) Goal: Food and/or nutrient delivery Individualized approach for food/nutrient provision.  Outcome: Not Progressing Minimal PO intake of full liquids, relying on TNA for nutrition

## 2011-06-02 NOTE — Progress Notes (Signed)
06/02/2011, 8:12 AM  Hospital day: 7 Antibiotics: day 6 vanc/zosyn, day 5 levaquin, day 3 diflucan Chemotherapy: day 14 cycle 2 adriamycin  Subjective: albuterol nebs helpful for cough and episodes of SOB, has been requesting these q 4 hrs. Good BMs x 2 yesterday. Clamping G tube only after meds, has not tried clamping after she drinks liquids and will try this today. Mr Patchen, mother and another friend here. No localizing signs otherwise with temp up to 103 last pm. Objective: Vital signs in last 24 hours: Blood pressure 111/77, pulse 115, temperature 100.1 F (37.8 C), temperature source Oral, resp. rate 16, height 5\' 6"  (1.676 m), weight 187 lb 6.3 oz (85 kg), last menstrual period 07/02/2010, SpO2 100.00%.  Tmax 103.1 at 0330. O2 sats 99-100% Intake/Output from previous day: 05/20 0701 - 05/21 0700 In: 4710 [P.O.:450; I.V.:1296; Blood:342; IV Piggyback:450; TPN:2132] Out: 1101 [Urine:750; Drains:350; Stool:1] Intake/Output this shift:    Physical exam: alert, looks comfortable now in bed on [place] O2. Able to sit up without asssitance. ALert and appropriate. No JVD. PAC site ok. Lungs with somewhat diminished BS but no wheeze or rales, no use of accessory muscles. Cor RRR. Abd soft, some distension but not tight. Light colored fluid in G tube. LE no edema, cords.   Lab Results:  Basename 06/02/11 0510 06/01/11 0455  WBC 7.2 2.8*  HGB 8.4* 7.6*  HCT 26.8* 24.6*  PLT 99* 93*   BMET  Basename 06/01/11 0455 05/31/11 1223  NA 138 137  K 3.7 3.9  CL 104 102  CO2 24 24  GLUCOSE 117* 97  BUN 29* 28*  CREATININE 1.55* 1.54*  CALCIUM 8.5 8.4   No + cultures. Studies/Results: Dg Chest 2 View  06/01/2011  *RADIOLOGY REPORT*  Clinical Data: Cough.  Neutropenia.  CHEST - 2 VIEW  Comparison: 05/30/2011.  Findings: The power port is stable.  The right PICC line is stable. The cardiac silhouette, mediastinal and hilar contours are unchanged.  There are linear bands of subsegmental  atelectasis at both lung bases.  No definite infiltrates or effusions.  Mild central vascular congestion but no edema.  IMPRESSION:  Bibasilar subsegmental atelectasis but no focal infiltrate or effusion.  Original Report Authenticated By: P. Loralie Champagne, M.D.   Dg Chest 2 View  05/31/2011  *RADIOLOGY REPORT*  Clinical Data: Fever, rule out pneumonia  CHEST - 2 VIEW  Comparison: 05/29/2011  Findings: Mild bibasilar airspace disease is unchanged.  Negative for heart failure.  Right arm PICC tip in the SVC at the cavoatrial junction.  Right sided Port-A-Cath tip in the SVC.  These are unchanged.  IMPRESSION: Mild bibasilar airspace disease is present which may represent atelectasis or pneumonia.  No significant interval change.  Original Report Authenticated By: Camelia Phenes, M.D.     Assessment/Plan: 1.Neutropenia from chemotherapy resolved.  2. Hgb up post 1 unit PRBCs yesterday; platelets recovering and no bleeding. 3 Endometrial cancer: recurrent since March after completing adjuvant therapy in midJan. Bowels are moving much better for past few days. Will try clamping G tube after po liquids. Plan has been for an additional cycle of chemotherapy in hopes of palliation. 4.Persistent spiking fevers: no postive cultures, multiple lines/tubes, more respiratory symptoms past few days with CXR not too remarkable. Will check CT angio chest.Since no longer neutropenic, ? decrease antibiotics 5.Lovenox resumed now 6.continuing TNA to support thru chemo, unless becomes able to tolerate po's. 7. DNR but full support to that point   Will follow. Thank  you Jancarlos Thrun P

## 2011-06-02 NOTE — Progress Notes (Signed)
Patient ID: Krystal [name], female   DOB: 1970-11-09, 41 y.o.   MRN: [id number]  Interim Summary:  41 year old female with history of metastatic endometrial carcinoma, cycle 2 adriamycin (single agent), given 05/20/2011, recent lengthy hospitalization for bowel obstruction (requireing G tube intermittent suction) related to the recurrent cancer. Patient admitted 05/27/2011 with working diagnosis of neutropenic fever.   Consults to date:  1. Oncology (dr. Darrold Span)  2. Interventional radiology - for paracentesis  3. Physical Therapy   Other consults: 1. Phramacy - for TNA and antibiotics  Assessment/Plan:   Principal Problem:   *Neutropenic fever  - secondary to immunosuppressed status secondary to advanced endometrial carcinoma  - blood culture x 2 sets and urine culture from 05/27/2011 and 05/29/2011 negative to date  - Antibiotic coverage at this time includes vancomycin and zosyn. Patient is status post recent lengthy hositalization so risk is for healthcare associated pneumonia for which reason we added Levaquin to her regimen.  - Neupogen 300 mg x 1 dose given on admission and we continued this dosage on daily basis per oncology; 06/01/2011 WBC recovered to 2.8 and per oncology Neupogen discontinued  - WBC 06/02/2011 are 7.8 - appreciate oncology following   Active Problems:   Cough  - perhaps secondary to pneumonia although not entirely clear why she has this cough and her lung exam is normal  - CXR done 06/01/2011 showed bibasilar atelectasis no effusion or infiltrates - patient did improve with nebulizer treatments and tussionex PRN Q 12 hours - per oncology, CT Chest done 06/02/2011 with findings of enlarging left pleural effusion however patient's lung exam is normal and she reports her cough has subsided and braething is much better so I would defer thoracentesis for now - continue nebulizer treatments for now, robitussin and oxygen support via 2 L nasal canula to keep O2 saturation  above 90%   Partial small bowel obstruction  - Treated conservatively with bowel rest and intermittent G tube suction  - A G-tube was placed by interventional radiology for symptom management on previous admission on 05/14/2011.  - TNA per pharmacy for nutritional support   Endometrial ca / Ascites  - Status post treatment with cisplatin/Adriamycin (cycle 2 given 05/20/2011); plan per oncology to continue chemotherapy for palliation - patient continues to complain of abdominal distention  - abdominal x ray on admission with findings of resolving but not yet completely resolved small bowel obstruction  - status post paracentesis on previous admissions (Paracentesis done on 05/12/2011 and 05/18/2011)  - abdominal ultrasound with large volume ascites 05/28/2011  - paracentesis done 05/29/2011 with 3 liters fluid removed and plan for paracentesis today 06/02/2011  Acute kidney injury  - creatinine slowly trended up from 1.06 on admission and 06/01/2011 creatinine 1.55  - possibly due to Hepatorenal syndrome or inadequate intravascular volume - continue to monitor renal function   Inadequate oral intake  - TNA for better nutritional support   Hypertension  - well controlled; BP 108/75  Anemia of chronic disease  - multifactorial, secondary to combination of malnutrition and malignancy  - hemoglobin was 7.6 06/01/2011 and after 1 unit PRBC transfusion hemoglobin rose to 8.4  - we will continue to monitor CBC   Code Status:  - DNR   Family Communication:  - patient and family updated at bedside - we had an excellent praying session today and my thoughts and prayers are with Krystal [name] and her family  Disposition Plan:  - Home with home health PT once  medically stable  Antibiotics:  Zosyn 05/27/2011 --->  Vancomycin 05/27/11 --->  Levaquin 05/29/2011 -->  Diflucan 05/31/2011 -->   Subjective: No events overnight. Patient reports abdominal pain due to distention but adequately controlled  with current analgesia. Patient did have a bowel movement yesteday.  Objective:  Vital signs in last 24 hours:   06/02/11 0431 06/02/11 0541 06/02/11 1304  BP:  111/77 108/75  Pulse:  115 116  Temp: 102.2 F (39 C) 100.1 F (37.8 C) 99.1 F (37.3 C)  TempSrc: Oral Oral Oral  Resp:  16 18  Weight:   90.357 kg (199 lb 3.2 oz)  SpO2:  99% 100%    Intake/Output from previous day:  Intake/Output Summary (Last 24 hours) at 06/02/11 1409 Last data filed at 06/02/11 0600  Gross per 24 hour  Intake   3714 ml  Output    350 ml  Net   3364 ml    Physical Exam: General: Alert, awake, oriented x3, in no acute distress. HEENT: No bruits, no goiter. Moist mucous membranes, no scleral icterus, no conjunctival pallor. Heart: Regular rate and rhythm, S1/S2 +, no murmurs, rubs, gallops. Lungs: Clear to auscultation bilaterally. No wheezing, no rhonchi, no rales.  Abdomen: distended with (+) fluid wave; diffusely tender but no rebound tenderness or guarding; (+) BS. Extremities: No clubbing or cyanosis, no pitting edema,  positive pedal pulses. Neuro: Grossly nonfocal.  Lab Results:  Lab 06/02/11 0510 06/01/11 0455 05/31/11 1223 05/30/11 0515 05/29/11 0420 05/28/11 0604  WBC 7.2 2.8* 1.4* 0.5* 0.4* --  HGB 8.4* 7.6* 8.2* 7.8* 8.0* --  HCT 26.8* 24.6* 25.6* 25.3* 26.0* --  PLT 99* 93* 86* 86* 163 --  MCV 85.6 84.8 83.9 85.5 85.5 --    Lab 06/01/11 0455 05/31/11 1223 05/30/11 0515 05/29/11 0420 05/28/11 0604 05/27/11 1230  NA 138 137 137 137 133* --  K 3.7 3.9 3.4* 3.9 4.2 --  CL 104 102 102 100 97 --  CO2 24 24 26 25 26  --  GLUCOSE 117* 97 130* 161* 88 --  BUN 29* 28* 24* 27* 19 --  CREATININE 1.55* 1.54* 1.61* 1.59* 1.21* --  CALCIUM 8.5 8.4 8.6 8.8 9.0 --  MG 1.7 -- -- -- 2.1 2.0    Lab 05/27/11 1230  INR 1.07  PROTIME --    URINE CULTURE     Status: Normal   Collection Time   05/27/11  7:00 AM      Component Value Range Status Comment   Specimen Description URINE   Final    Special Requests NONE   Final    Colony Count 8,000 COLONIES/ML   Final    Culture INSIGNIFICANT GROWTH   Final    Report Status 05/28/2011 FINAL   Final   CULTURE, BLOOD (ROUTINE X 2)     Status: Normal   Collection Time   05/27/11  7:15 AM      Component Value Range Status Comment   Culture NO GROWTH 5 DAYS   Final    Report Status 06/02/2011 FINAL   Final   CULTURE, BLOOD (ROUTINE X 2)     Status: Normal   Collection Time   05/27/11  9:25 AM      Component Value Range Status Comment   Culture NO GROWTH 5 DAYS   Final    Report Status 06/02/2011 FINAL   Final   CULTURE, BLOOD (ROUTINE X 2)     Status: Normal (Preliminary result)   Collection Time  05/29/11  7:00 AM      Component Value Range Status Comment   Culture     Final    Value:        BLOOD CULTURE RECEIVED NO GROWTH TO DATE    Report Status PENDING   Incomplete   CULTURE, BLOOD (ROUTINE X 2)     Status: Normal (Preliminary result)   Collection Time   05/29/11  7:10 AM      Component Value Range Status Comment   Culture     Final    Value:        BLOOD CULTURE RECEIVED NO GROWTH TO DATE    Report Status PENDING   Incomplete     Studies/Results: Dg Chest 2 View Jun 02, 2011    IMPRESSION:  Bibasilar subsegmental atelectasis but no focal infiltrate or effusion.    Ct Angio Chest W/cm &/or Wo Cm 06/02/2011    IMPRESSION:  1.  No evidence of acute pulmonary embolism. 2.  Enlarging left pleural effusion with increasing left basilar air space disease, likely atelectasis. 3.  Upper abdominal ascites with possible peritoneal nodularity in the left upper quadrant, concerning for peritoneal metastatic disease.  The patient underwent paracentesis 05/29/2011. 4.  Stable mediastinal and left hilar adenopathy.     Medications: Scheduled Meds:   . alteplase  2 mg Intracatheter Once  . cloNIDine  0.1 mg Transdermal Weekly  . enoxaparin  40 mg Subcutaneous Q24H  . feeding supplement  237 mL Oral BID BM  . fluconazole  (DIFLUCAN) IV  100 mg Intravenous Q24H  . insulin aspart  0-9 Units Subcutaneous TID  . lactulose  30 g Per Tube TID  . pantoprazole (PROTONIX) IV  40 mg Intravenous Q12H  . piperacillin-tazobactam (ZOSYN)  IV  3.375 g Intravenous Q8H  . sodium chloride  3 mL Intravenous Q12H  . vancomycin  750 mg Intravenous Q12H   Continuous Infusions:   . sodium chloride 50 mL/hr at 06/02/2011 0814  . fat emulsion 240 mL (Jun 02, 2011 1800)  . TPN (CLINIMIX) +/- additives    . TPN (CLINIMIX) +/- additives     PRN Meds:.acetaminophen (TYLENOL) oral liquid 160 mg/5 mL, albuterol, benzocaine, benzonatate, chlorpheniramine-HYDROcodone, guaiFENesin-dextromethorphan, HYDROmorphone (DILAUDID) injection, iohexol, lip balm, LORazepam, morphine, ondansetron (ZOFRAN) IV, promethazine, sodium chloride   LOS: 6 days   Jamiah Homeyer 06/02/2011, 2:09 PM  [facility] Pager: [phone]

## 2011-06-02 NOTE — Progress Notes (Addendum)
PARENTERAL NUTRITION CONSULT NOTE - Follow up  Pharmacy Consult for TNA Indication: Hx of pSBO  No Known Allergies  Patient Measurements: Height: 5\' 6"  (167.6 cm) Weight: 187 lb 6.3 oz (85 kg) IBW/kg (Calculated) : 59.3   Vital Signs: Temp: 100.1 F (37.8 C) (05/21 0541) Temp src: Oral (05/21 0541) BP: 111/77 mmHg (05/21 0541) Pulse Rate: 115  (05/21 0541) Intake/Output from previous day: 05/20 0701 - 05/21 0700 In: 4710 [P.O.:450; I.V.:1296; Blood:342; IV Piggyback:450; TPN:2132] Out: 1101 [Urine:750; Drains:350; Stool:1]  Labs:  [facility] 06/02/11 0510 06/01/11 0455 05/31/11 1223  WBC 7.2 2.8* 1.4*  HGB 8.4* 7.6* 8.2*  HCT 26.8* 24.6* 25.6*  PLT 99* 93* 86*  APTT -- -- --  INR -- -- --     Basename 06/01/11 0455 05/31/11 1223  NA 138 137  K 3.7 3.9  CL 104 102  CO2 24 24  GLUCOSE 117* 97  BUN 29* 28*  CREATININE 1.55* 1.54*  LABCREA -- --  CREAT24HRUR -- --  CALCIUM 8.5 8.4  MG 1.7 --  PHOS 3.0 --  PROT 6.0 --  ALBUMIN 1.8* --  AST 13 --  ALT 10 --  ALKPHOS 91 --  BILITOT 0.3 --  BILIDIR -- --  IBILI -- --  PREALBUMIN 9.1* --  TRIG 108 --  CHOLHDL -- --  CHOL 138 --   Estimated Creatinine Clearance: 51.4 ml/min (by C-G formula based on Cr of 1.55).    Basename 06/02/11 0537 06/02/11 0003 06/01/11 1750  GLUCAP 173* 129* 79   Medications:  Scheduled:     . alteplase  2 mg Intracatheter Once  . cloNIDine  0.1 mg Transdermal Weekly  . enoxaparin  40 mg Subcutaneous Q24H  . fluconazole (DIFLUCAN) IV  100 mg Intravenous Q24H  . insulin aspart  0-9 Units Subcutaneous TID  . lactulose  30 g Per Tube TID  . pantoprazole (PROTONIX) IV  40 mg Intravenous Q12H  . piperacillin-tazobactam (ZOSYN)  IV  3.375 g Intravenous Q8H  . sodium chloride  3 mL Intravenous Q12H  . vancomycin  750 mg Intravenous Q12H   Infusions:     . sodium chloride 50 mL/hr at 06/01/11 0814  . fat emulsion 240 mL (06/01/11 1800)  . TPN (CLINIMIX) +/- additives    . TPN  (CLINIMIX) +/- additives    . DISCONTD: fat emulsion     Insulin Requirements in the past 24 hours:  CBGs: 79-159 Insulin: 3 units  Current Nutrition:  TNA: Clinimix E 5/20 cyclic over 14 hours (50 ml/hr from 6pm-7pm, 150 ml/hr from 7pm-7am, and 50 ml/hr from 7am-8am.  Off from 8am until 6pm) Diet: Full liquid; po intake increased, stooling  Assessment:  41YOF w/TNA started during previous admission (4/25) for partial SBO (resolving per Xray 5/15). Changed to cyclic at the end of previous admission and d/c'd on cyclic TNA. Pt readmitted 5/15 and cyclic TNA resumed  Electrolytes:  WNL. Scr continues ~ 1.5  CBG within goal < 150  Albumin low, 2.3 (5/16), AST/ALT wnl (5/16)  IVF: NS at 50 ml/hr  Ppx: Protonix q12h  Nutritional Goals:   RD recs 5/15:  Kcal 1750-2050/day, Protein 90-105 g/day, Fluid 1.7-2L/day  Clinimix E 5/20 at goal of + MWF only lipids  20% 240 ml = average of 1896 kcal/d and 96 g protein/day  Plan:   Continue cyclic Clinimix E 5/20 1900 mL over 14 hours  No reduction in rate to reduce protein load (acute reaction)  Fat emulsion  at 82ml/hr over 14 hours (MWF only due to ongoing shortage).  TNA to contain standard multivitamins and trace elements (MWF only due to ongoing shortage).  CBGs and Sensitive SSI Q8h, scheduled while TNA is running  TNA lab panels on Mondays & Thursdays.  BMET in am   Otho Bellows PharmD  06/02/2011 11:47 AM

## 2011-06-02 NOTE — Progress Notes (Signed)
ANTIBIOTIC CONSULT NOTE - Follow up  Pharmacy Consult for Vanc, Zosyn,  Indication: neutropenic fever (neutropenia resolved)  No Known Allergies  Patient Measurements: Height: 5\' 6"  (167.6 cm) Weight: 187 lb 6.3 oz (85 kg) IBW/kg (Calculated) : 59.3   Vital Signs: Temp: 100.1 F (37.8 C) (05/21 0541) Temp src: Oral (05/21 0541) BP: 111/77 mmHg (05/21 0541) Pulse Rate: 115  (05/21 0541) Labs:  Basename 06/02/11 0510 06/01/11 0455 05/31/11 1223  WBC 7.2 2.8* 1.4*  HGB 8.4* 7.6* 8.2*  PLT 99* 93* 86*  LABCREA -- -- --  CREATININE -- 1.55* 1.54*   Estimated Creatinine Clearance: 51.4 ml/min (by C-G formula based on Cr of 1.55). No results found for this basename: VANCOTROUGH:2,VANCOPEAK:2,VANCORANDOM:2,GENTTROUGH:2,GENTPEAK:2,GENTRANDOM:2,TOBRATROUGH:2,TOBRAPEAK:2,TOBRARND:2,AMIKACINPEAK:2,AMIKACINTROU:2,AMIKACIN:2, in the last 72 hours   Microbiology: 5/15 blood: no growth-final 5/15 urine: insignificant growth-final 5/17 blood: no growth to date  Medications:  Anti-infectives     Start     Dose/Rate Route Frequency Ordered Stop   05/31/11 1000   fluconazole (DIFLUCAN) IVPB 100 mg        100 mg 50 mL/hr over 60 Minutes Intravenous Every 24 hours 05/31/11 0825     05/30/11 0000   vancomycin (VANCOCIN) 750 mg in sodium chloride 0.9 % 150 mL IVPB        750 mg 150 mL/hr over 60 Minutes Intravenous Every 12 hours 05/29/11 1308     05/29/11 1500   levofloxacin (LEVAQUIN) IVPB 750 mg        750 mg 100 mL/hr over 90 Minutes Intravenous Every 24 hours 05/29/11 1402 05/31/11 1610   05/29/11 1415   ceFEPIme (MAXIPIME) 1 g in dextrose 5 % 50 mL IVPB  Status:  Discontinued        1 g 100 mL/hr over 30 Minutes Intravenous 3 times per day 05/29/11 1402 05/29/11 1403   05/29/11 1000   metroNIDAZOLE (FLAGYL) IVPB 500 mg  Status:  Discontinued     Comments: Pharmacy to dose please      500 mg 100 mL/hr over 60 Minutes Intravenous Every 8 hours 05/29/11 0909 05/29/11 1432   05/28/11 1200   vancomycin (VANCOCIN) 1,250 mg in sodium chloride 0.9 % 250 mL IVPB  Status:  Discontinued        1,250 mg 166.7 mL/hr over 90 Minutes Intravenous Every 12 hours 05/28/11 0846 05/29/11 1308   05/27/11 1800   vancomycin (VANCOCIN) IVPB 1000 mg/200 mL premix  Status:  Discontinued        1,000 mg 200 mL/hr over 60 Minutes Intravenous Every 8 hours 05/27/11 1135 05/28/11 0846   05/27/11 1400   piperacillin-tazobactam (ZOSYN) IVPB 3.375 g        3.375 g 12.5 mL/hr over 240 Minutes Intravenous 3 times per day 05/27/11 1135     05/27/11 0645   vancomycin (VANCOCIN) IVPB 1000 mg/200 mL premix  Status:  Discontinued        1,000 mg 200 mL/hr over 60 Minutes Intravenous  Once 05/27/11 0622 05/27/11 1101   05/27/11 0645   piperacillin-tazobactam (ZOSYN) IVPB 3.375 g        3.375 g 100 mL/hr over 30 Minutes Intravenous  Once 05/27/11 0622 05/27/11 0720         Assessment:  41 yoF admit with neutropenic fever (resolved s/p Neupogen)  Day #7 vanc and zosy, D#3 Fluconazole, Completed 3 days levaquin  SCr increased, 0.93 > 1.55, with CrCl ~ 52 ml/min  Blood cx 5/15 no growth, Blood cx rpt 5/17-no growth  Vancomycin  5/17 elevated, dose adjusted  Continues to spike temps; Abd CT ordered: L pleural effusion, ascites, possible peritoneal nodules, atalectasis.   Goal of Therapy:  Vancomycin trough level 15-20 mcg/ml  Plan:   Vancomycin 750mg  IV Q12h  Continue Zosyn 3.375g IV Q8H infused over 4hrs.  Fluconazole 100mg  q24 per MD, dose appropriate for renal function  Monitor renal fxn and culture results.  Await decision regarding de-escalation of abx, will consider rpt Vancomycin trough  Shauna Bodkins L PharmD 06/02/2011 11:57 AM

## 2011-06-03 ENCOUNTER — Inpatient Hospital Stay (HOSPITAL_COMMUNITY): Payer: Medicaid Other

## 2011-06-03 LAB — GLUCOSE, CAPILLARY
Glucose-Capillary: 162 mg/dL — ABNORMAL HIGH (ref 70–99)
Glucose-Capillary: 104 mg/dL — ABNORMAL HIGH (ref 70–99)

## 2011-06-03 LAB — CBC
Hemoglobin: 8.3 g/dL — ABNORMAL LOW (ref 12.0–15.0)
MCH: 26.9 pg (ref 26.0–34.0)
MCHC: 31.4 g/dL (ref 30.0–36.0)
Platelets: 138 10*3/uL — ABNORMAL LOW (ref 150–400)
RDW: 16.6 % — ABNORMAL HIGH (ref 11.5–15.5)

## 2011-06-03 LAB — DIFFERENTIAL
Basophils Relative: 1 % (ref 0–1)
Eosinophils Absolute: 0 10*3/uL (ref 0.0–0.7)
Lymphocytes Relative: 7 % — ABNORMAL LOW (ref 12–46)
Neutrophils Relative %: 76 % (ref 43–77)

## 2011-06-03 LAB — BASIC METABOLIC PANEL
Calcium: 8.2 mg/dL — ABNORMAL LOW (ref 8.4–10.5)
GFR calc Af Amer: 49 mL/min — ABNORMAL LOW (ref >90)
GFR calc non Af Amer: 43 mL/min — ABNORMAL LOW (ref >90)
Glucose, Bld: 137 mg/dL — ABNORMAL HIGH (ref 70–99)
Potassium: 4 mEq/L (ref 3.5–5.1)
Sodium: 138 mEq/L (ref 135–145)

## 2011-06-03 MED ORDER — TRACE MINERALS CR-CU-MN-SE-ZN 10-1000-500-60 MCG/ML IV SOLN
INTRAVENOUS | Status: AC
  Administered 2011-06-03: 17:00:00 via INTRAVENOUS
  Filled 2011-06-03: qty 2000

## 2011-06-03 MED ORDER — FAT EMULSION 20 % IV EMUL
240.0000 mL | INTRAVENOUS | Status: AC
  Administered 2011-06-03: 240 mL via INTRAVENOUS
  Filled 2011-06-03 (×2): qty 250

## 2011-06-03 MED ORDER — SODIUM CHLORIDE 0.9 % IJ SOLN
10.0000 mL | Freq: Two times a day (BID) | INTRAMUSCULAR | Status: DC
  Administered 2011-06-03 – 2011-06-06 (×5): 10 mL via INTRAVENOUS

## 2011-06-03 MED ORDER — ACETAMINOPHEN 160 MG/5ML PO SOLN
325.0000 mg | ORAL | Status: DC | PRN
  Administered 2011-06-03 (×2): 325 mg via ORAL
  Filled 2011-06-03: qty 20.3

## 2011-06-03 MED ORDER — HYDROMORPHONE HCL PF 1 MG/ML IJ SOLN
1.0000 mg | INTRAMUSCULAR | Status: DC | PRN
  Administered 2011-06-03 – 2011-06-06 (×9): 1 mg via INTRAVENOUS
  Filled 2011-06-03 (×9): qty 1

## 2011-06-03 NOTE — Progress Notes (Addendum)
PROGRESS NOTE  Krystal [name]:[id number] DOB: August 29, 1970 DOA: 05/27/2011 PCP: [name], MD, MD  Brief narrative: Krystal [name] is a 41 year old female with endometrial carcinoma status post 2 cycles of chemotherapy, last given on 05/20/2011, whose treatment course has been complicated by a small bowel obstruction. During her previous hospital stay, a G-tube was placed for gastric drainage. She was admitted to the hospital on 05/27/2011 with fever in the setting of neutropenia.  Assessment/Plan: Principal Problem:  *Neutropenic fever in the setting of treatment for endometrial cancer, status post chemotherapy  Secondary to immunosuppressed status from malignancy and chemotherapy treatment.  Per oncology next chemotherapy was scheduled 3 weeks from 05/20/2011.  Started on empiric antibiotics with vancomycin and zosyn upon admission, which does provide broad spectrum coverage. With ongoing fever, could consider a switch to alternative therapy.  Unfortunately, cultures were not requested of the ascitic fluid and the lab no longer has the ascitic fluid that was drained earlier today. Adding empiric antifungal coverage is also a consideration. Patient does not have any specific complaints of abdominal pain and is only mildly tender on exam so doubt she has peritonitis. Question whether "tumor fever" may be playing a role.  Blood cultures negative to date, urine culture with insignificant growth.   Neupogen 300 mg x 1 dose given stat on admission according to Dr. Armando Gang notes. Not currently on this therapy.  Dr. Darrold Span is following the patient's course as well. Active Problems:  Partial small bowel obstruction with inadequate oral intake  Treated conservatively with bowel rest and G.-tube decompression of the stomach.   Evaluated by surgery during previous admission, not felt to be a surgical candidate.  TNA per pharmacy for nutritional support.  Chronic constipation  Continue bowel  regimen as tolerated.  Ascites  Recurrent malignant ascites noted. Patient underwent successful ultrasound-guided paracentesis yielding 2.2 L of amber fluid on 06/03/2011.  HTN (hypertension)  Blood pressure controlled on a clonidine patch.  Thrombocytopenia  Platelet count stable with no signs of bleeding. Likely from the sequela of prior chemotherapy.  ARF (acute renal failure) with history of Hydronephrosis  Creatinine slowly rising over time with admission creatinine of 1.06.  Anemia of chronic disease  Hemoglobin 7.6-8.4. Likely a combination of anemia of chronic disease as well as the sequela of prior chemotherapy.  Hypokalemia  Electrolytes being monitored closely with replacement as needed.  Code Status: DNR Family Communication: None at bedside. Disposition Plan: Home, when stable.  Medical Consultants:  Dr. Jama Flavors, Oncology  Other consultants:  Pharmacy for TNA and antibiotic dosing.  Antibiotics: Zosyn 05/27/2011--->  Vancomycin 05/27/11--->   Subjective  [name] denies any abdominal pain.  No nausea or vomiting.  States her bowels moved today, and that she is able to clamp her G-tube temporarily to eat or take medicines.   Objective    Interim History: Stable overnight.   Objective: Filed Vitals:   06/03/11 1057 06/03/11 1119 06/03/11 1125 06/03/11 1321  BP: 128/84 123/78 126/81 116/83  Pulse:    116  Temp:    100.3 F (37.9 C)  TempSrc:    Oral  Resp:    16  Height:      Weight:      SpO2:    98%    Intake/Output Summary (Last 24 hours) at 06/03/11 1420 Last data filed at 06/03/11 0939  Gross per 24 hour  Intake    720 ml  Output   1250 ml  Net   -530 ml  Exam: Gen:  NAD Cardiovascular:  RRR, No M/R/G Respiratory: Lungs CTAB Gastrointestinal: Abdomen softly disteended, no rebound or guarding.  PEG tube present. Extremities: No C/E/C    Data Reviewed: Basic Metabolic Panel:  Lab 06/03/11 4098 06/01/11 0455 05/31/11  1223 05/30/11 0515 05/29/11 0420 05/28/11 0604  NA 138 138 137 137 137 --  K 4.0 3.7 -- -- -- --  CL 105 104 102 102 100 --  CO2 24 24 24 26 25  --  GLUCOSE 137* 117* 97 130* 161* --  BUN 28* 29* 28* 24* 27* --  CREATININE 1.47* 1.55* 1.54* 1.61* 1.59* --  CALCIUM 8.2* 8.5 8.4 8.6 8.8 --  MG -- 1.7 -- -- -- 2.1  PHOS -- 3.0 -- -- -- 4.3   GFR Estimated Creatinine Clearance: 55.9 ml/min (by C-G formula based on Cr of 1.47). Liver Function Tests:  Lab 06/01/11 0455 05/28/11 0604  AST 13 30  ALT 10 29  ALKPHOS 91 140*  BILITOT 0.3 0.7  PROT 6.0 6.8  ALBUMIN 1.8* 2.3*    CBC:  Lab 06/03/11 0654 06/02/11 0510 06/01/11 0455 05/31/11 1223 05/30/11 0515  WBC 10.4 7.2 2.8* 1.4* 0.5*  NEUTROABS 7.9* 5.6 1.7 0.6* --  HGB 8.3* 8.4* 7.6* 8.2* 7.8*  HCT 26.4* 26.8* 24.6* 25.6* 25.3*  MCV 85.7 85.6 84.8 83.9 85.5  PLT 138* 99* 93* 86* 86*   CBG:  Lab 06/03/11 0550 06/03/11 0053 06/02/11 1750 06/02/11 0537 06/02/11 0003  GLUCAP 162* 156* 72 173* 129*   Lipid Profile  Basename 06/01/11 0455  CHOL 138  HDL --  LDLCALC --  TRIG 108  CHOLHDL --  LDLDIRECT --   Microbiology Recent Results (from the past 240 hour(s))  URINE CULTURE     Status: Normal   Collection Time   05/27/11  7:00 AM      Component Value Range Status Comment   Specimen Description URINE, CLEAN CATCH   Final    Special Requests NONE   Final    Culture  Setup Time [id number]   Final    Colony Count 8,000 COLONIES/ML   Final    Culture INSIGNIFICANT GROWTH   Final    Report Status 05/28/2011 FINAL   Final   CULTURE, BLOOD (ROUTINE X 2)     Status: Normal   Collection Time   05/27/11  7:15 AM      Component Value Range Status Comment   Specimen Description BLOOD LEFT ANTECUBITAL   Final    Special Requests BOTTLES DRAWN AEROBIC AND ANAEROBIC   Final    Culture  Setup Time [id number]   Final    Culture NO GROWTH 5 DAYS   Final    Report Status 06/02/2011 FINAL   Final   CULTURE, BLOOD (ROUTINE X  2)     Status: Normal   Collection Time   05/27/11  9:25 AM      Component Value Range Status Comment   Specimen Description BLOOD RIGHT PORT   Final    Special Requests BOTTLES DRAWN AEROBIC AND ANAEROBIC [facility]   Final    Culture  Setup Time [id number]   Final    Culture NO GROWTH 5 DAYS   Final    Report Status 06/02/2011 FINAL   Final   CULTURE, BLOOD (ROUTINE X 2)     Status: Normal (Preliminary result)   Collection Time   05/29/11  7:00 AM      Component Value Range Status Comment   Specimen  Description BLOOD LEFT HAND   Final    Special Requests BOTTLES DRAWN AEROBIC AND ANAEROBIC 5CC   Final    Culture  Setup Time [id number]   Final    Culture     Final    Value:        BLOOD CULTURE RECEIVED NO GROWTH TO DATE CULTURE WILL BE HELD FOR 5 DAYS BEFORE ISSUING A FINAL NEGATIVE REPORT   Report Status PENDING   Incomplete   CULTURE, BLOOD (ROUTINE X 2)     Status: Normal (Preliminary result)   Collection Time   05/29/11  7:10 AM      Component Value Range Status Comment   Specimen Description BLOOD LEFT HAND   Final    Special Requests BOTTLES DRAWN AEROBIC AND ANAEROBIC [facility]   Final    Culture  Setup Time [id number]   Final    Culture     Final    Value:        BLOOD CULTURE RECEIVED NO GROWTH TO DATE CULTURE WILL BE HELD FOR 5 DAYS BEFORE ISSUING A FINAL NEGATIVE REPORT   Report Status PENDING   Incomplete     Procedures and Diagnostic Studies:   US Paracentesis 06/03/2011 IMPRESSION: Successful ultrasound guided paracentesis yielding 2.2 liters of ascites.  Read by Brayton El PA-C  Original Report Authenticated By: Osa Craver, M.D.    Dg Abd Acute W/chest 06/03/2011 IMPRESSION: Retrocardiac collapse / consolidation.  No evidence for bowel perforation or obstruction.  Original Report Authenticated By: ERIC A. MANSELL, M.D.    Ct Angio Chest W/cm &/or Wo Cm 06/02/2011 IMPRESSION:  1.  No evidence of acute pulmonary embolism. 2.  Enlarging left pleural effusion  with increasing left basilar air space disease, likely atelectasis. 3.  Upper abdominal ascites with possible peritoneal nodularity in the left upper quadrant, concerning for peritoneal metastatic disease.  The patient underwent paracentesis 05/29/2011. 4.  Stable mediastinal and left hilar adenopathy.  Original Report Authenticated By: Gerrianne Scale, M.D.    Dg Chest 2 View 06/01/2011  IMPRESSION:  Bibasilar subsegmental atelectasis but no focal infiltrate or effusion.  Original Report Authenticated By: P. Loralie Champagne, M.D.    Dg Chest 2 View 05/31/2011 IMPRESSION: Mild bibasilar airspace disease is present which may represent atelectasis or pneumonia.  No significant interval change.  Original Report Authenticated By: Camelia Phenes, M.D.    Dg Chest Port 1v Same Day 05/29/2011 IMPRESSION: Increased retrocardiac opacity could reflect worsening basilar atelectasis or infection/pneumonia.  Original Report Authenticated By: Harley Hallmark, M.D.    Dg Chest 2 View 05/27/2011  IMPRESSION: Shallow inspiration with bilateral basilar atelectasis.  Cardiac enlargement.  No significant interval change.  Original Report Authenticated By: Marlon Pel, M.D.   Dg Chest Port 1v Same Day 05/29/2011 IMPRESSION: Increased retrocardiac opacity could reflect worsening basilar atelectasis or infection/pneumonia.  Original Report Authenticated By: Harley Hallmark, M.D.    Dg Abd Acute W/chest 05/26/2011  IMPRESSION: Resolving but not yet cleared small bowel obstruction.  Bibasilar atelectasis.  Original Report Authenticated By: Elsie Stain, M.D.    Scheduled Meds:   . cloNIDine  0.1 mg Transdermal Weekly  . enoxaparin  40 mg Subcutaneous Q24H  . feeding supplement  237 mL Oral BID BM  . insulin aspart  0-9 Units Subcutaneous TID  . lactulose  30 g Per Tube TID  . pantoprazole (PROTONIX) IV  40 mg Intravenous Q12H  . piperacillin-tazobactam (ZOSYN)  IV  3.375  g Intravenous Q8H  . sodium chloride   3 mL Intravenous Q12H  . vancomycin  750 mg Intravenous Q12H  . DISCONTD: fluconazole (DIFLUCAN) IV  100 mg Intravenous Q24H   Continuous Infusions:   . sodium chloride 1,000 mL (06/03/11 0644)  . fat emulsion 240 mL (06/01/11 1800)  . fat emulsion    . TPN (CLINIMIX) +/- additives 50 mL/hr at 06/02/11 1848  . TPN [facility]) +/- additives        LOS: 7 days   Hillery Aldo, MD Pager [phone]  06/03/2011, 2:20 PM

## 2011-06-03 NOTE — Progress Notes (Signed)
06/03/2011, 8:56 AM  Hospital day: 8 Antibiotics: day 7 vanc/zosyn, day 5 levaquin, (diflucan DCd) Chemotherapy: day 15 cycle 2 adriamycin  Subjective: DId better yesterday including walking in hall, then very uncomfortable with abdominal distension and pain overnight. Increased nausea/vomiting with abdominal symptoms overnight, IV dilaudid not helping adequately. Febrile still. Bowels did move today. She has tried clamping Gtube after po liquids. Mr.Ige at bedside. Objective: Vital signs in last 24 hours: Blood pressure 119/83, pulse 115, temperature 100 F (37.8 C), temperature source Oral, resp. rate 18, height 5\' 6"  (1.676 m), weight 199 lb 3.2 oz (90.357 kg), last menstrual period 07/02/2010, SpO2 100.00%. Tmax 102.2 yesterday AM. Awake and alert, looks uncomfortable, respirations not labored on [place] O2 supine. Mouth moist. PERRL, PAC site ok, PICC also. Abdomen tightly distended and somewhat tender to palpation thruout. G tube to suction with clear fluid in tubing. LE no edema, cords.  Intake/Output from previous day: 05/21 0701 - 05/22 0700 In: 1251.2 [P.O.:720; I.V.:254.9; IV Piggyback:50; TPN:226.4] Out: 650 [Drains:650] Intake/Output this shift:   Lab Results:  Basename 06/03/11 0654 06/02/11 0510  WBC 10.4 7.2  HGB 8.3* 8.4*  HCT 26.4* 26.8*  PLT 138* 99*   BMET  Basename 06/03/11 0654 06/01/11 0455  NA 138 138  K 4.0 3.7  CL 105 104  CO2 24 24  GLUCOSE 137* 117*  BUN 28* 29*  CREATININE 1.47* 1.55*  CALCIUM 8.2* 8.5    Studies/Results: Dg Chest 2 View  06/01/2011  *RADIOLOGY REPORT*  Clinical Data: Cough.  Neutropenia.  CHEST - 2 VIEW  Comparison: 05/30/2011.  Findings: The power port is stable.  The right PICC line is stable. The cardiac silhouette, mediastinal and hilar contours are unchanged.  There are linear bands of subsegmental atelectasis at both lung bases.  No definite infiltrates or effusions.  Mild central vascular congestion but no edema.   IMPRESSION:  Bibasilar subsegmental atelectasis but no focal infiltrate or effusion.  Original Report Authenticated By: P. Loralie Champagne, M.D.   Ct Angio Chest W/cm &/or Wo Cm  06/02/2011  *RADIOLOGY REPORT*  Clinical Data: Spiking fevers with cough and shortness of breath. Recurrent endometrial cancer.  Evaluate for pulmonary embolism.  CT ANGIOGRAPHY CHEST  Technique:  Multidetector CT imaging of the chest using the standard protocol during bolus administration of intravenous contrast. Multiplanar reconstructed images including MIPs were obtained and reviewed to evaluate the vascular anatomy.  Contrast: OMNIPAQUE IOHEXOL 300 MG/ML  SOLN  Comparison: Chest CTA 05/22/2011.  Findings: The pulmonary arteries are well opacified with contrast. There is no evidence of acute pulmonary embolism.  The thoracic aorta has a stable appearance.  Right IJ central venous catheter is stable in position.  There is an enlarging left pleural effusion which is now moderate in size.  This demonstrates no definite nodularity or abnormal enhancement.  There is no significant right-sided pleural effusion or pericardial effusion.  There is increased air space disease in the left lower lobe.  Right basilar atelectasis appears stable. There is a stable small left upper lobe nodule on image 35.  1.6 cm AP window lymph node on image 27 and 1.2 cm left hilar node on image 28 are unchanged.  There is no progressive lymphadenopathy. Mild thyroid nodularity appears unchanged.  Images through the upper abdomen again demonstrate ascites with possible peritoneal nodularity along the splenic flexure of the colon.  Low density hepatic lesions are unchanged.  IMPRESSION:  1.  No evidence of acute pulmonary embolism. 2.  Enlarging  left pleural effusion with increasing left basilar air space disease, likely atelectasis. 3.  Upper abdominal ascites with possible peritoneal nodularity in the left upper quadrant, concerning for peritoneal  metastatic disease.  The patient underwent paracentesis 05/29/2011. 4.  Stable mediastinal and left hilar adenopathy.  Original Report Authenticated By: Gerrianne Scale, M.D.    Acute abd ordered now; have also asked for US paracentesis if sufficient ascites and otherwise appropriate. Assessment/Plan: 1. Progressive abdominal distension and increased pain, also still febrile: ? Recurrent malignant ascites &/or peritonitis or other. Increase dilaudid to q 1 hr prn. Acute abd xrays and possible US paracentesis. ? If present antibiotics still best combination? 2.No PE by CT angio chest yesterday. Agree pleural effusion not enough to need thoracentesis. Back on Lovenox prophylactically 3.bowel obstruction: has seemed some better with good bowel movements daily x several days and better tolerance of po liquids (?). Still on TNA 4.Recurrent endometrial carcinoma: counts recovered from last chemotherapy. Decision re further chemotherapy when this is due next week, depending then on overall status. 5.DNR 6. Difficult social situation   Jeramie Scogin P

## 2011-06-03 NOTE — Procedures (Signed)
Successful US guided paracentesis from Left lateral abdomen.  Yielded 2.2L of amber colored fluid.  No immediate complications.  Pt tolerated well.   Specimen was not sent for labs.  Brayton El PA-C 06/03/2011 11:29 AM

## 2011-06-03 NOTE — Progress Notes (Signed)
PT Cancellation Note  ___Treatment cancelled today due to medical issues with patient which prohibited therapy  _X_ Treatment cancelled today due to patient receiving procedure or test ....the patient out of room  ___ Treatment cancelled today due to patient's refusal to participate   ___ Treatment cancelled today due to  Felecia Shelling  PTA [facility]  Acute  Rehab Pager     [phone]

## 2011-06-03 NOTE — Progress Notes (Signed)
PARENTERAL NUTRITION CONSULT NOTE - Follow up  Pharmacy Consult for TNA Indication: Hx of pSBO  No Known Allergies  Patient Measurements: Height: 5\' 6"  (167.6 cm) Weight: 199 lb 3.2 oz (90.357 kg) IBW/kg (Calculated) : 59.3   Vital Signs: Temp: 100.1 F (37.8 C) (05/22 0555) Temp src: Oral (05/22 0555) BP: 119/83 mmHg (05/22 0555) Pulse Rate: 115  (05/22 0555) Intake/Output from previous day: 05/21 0701 - 05/22 0700 In: 1251.2 [P.O.:720; I.V.:254.9; IV Piggyback:50; TPN:226.4] Out: 650 [Drains:650]  Labs:  [facility] 06/02/11 0510 06/01/11 0455 05/31/11 1223  WBC 7.2 2.8* 1.4*  HGB 8.4* 7.6* 8.2*  HCT 26.8* 24.6* 25.6*  PLT 99* 93* 86*  APTT -- -- --  INR -- -- --     Basename 06/01/11 0455 05/31/11 1223  NA 138 137  K 3.7 3.9  CL 104 102  CO2 24 24  GLUCOSE 117* 97  BUN 29* 28*  CREATININE 1.55* 1.54*  LABCREA -- --  CREAT24HRUR -- --  CALCIUM 8.5 8.4  MG 1.7 --  PHOS 3.0 --  PROT 6.0 --  ALBUMIN 1.8* --  AST 13 --  ALT 10 --  ALKPHOS 91 --  BILITOT 0.3 --  BILIDIR -- --  IBILI -- --  PREALBUMIN 9.1* --  TRIG 108 --  CHOLHDL -- --  CHOL 138 --   Estimated Creatinine Clearance: 53 ml/min (by C-G formula based on Cr of 1.55).    Basename 06/03/11 0550 06/03/11 0053 06/02/11 1750  GLUCAP 162* 156* 72   Medications:  Scheduled:     . cloNIDine  0.1 mg Transdermal Weekly  . enoxaparin  40 mg Subcutaneous Q24H  . feeding supplement  237 mL Oral BID BM  . fluconazole (DIFLUCAN) IV  100 mg Intravenous Q24H  . insulin aspart  0-9 Units Subcutaneous TID  . lactulose  30 g Per Tube TID  . pantoprazole (PROTONIX) IV  40 mg Intravenous Q12H  . piperacillin-tazobactam (ZOSYN)  IV  3.375 g Intravenous Q8H  . sodium chloride  3 mL Intravenous Q12H  . vancomycin  750 mg Intravenous Q12H   Infusions:     . sodium chloride 1,000 mL (06/03/11 0644)  . fat emulsion 240 mL (06/01/11 1800)  . TPN (CLINIMIX) +/- additives 50 mL/hr at 06/02/11 1848  . TPN  (CLINIMIX) +/- additives     Insulin Requirements in the past 24 hours:  CBGs: 72-173 Insulin: 5 units  Current Nutrition:  TNA: Clinimix E 5/20 cyclic over 14 hours (50 ml/hr from 6pm-7pm, 150 ml/hr from 7pm-7am, and 50 ml/hr from 7am-8am.  Off from 8am until 6pm) Diet: Full liquid; po intake increased, stooling  Assessment:  41YOF w/TNA started during previous admission (4/25) for partial SBO (resolving per Xray 5/15). Changed to cyclic at the end of previous admission and d/c'd on cyclic TNA. Pt readmitted 5/15 and cyclic TNA resumed  Electrolytes:  WNL 5/20, no new labs today. Scr continues ~ 1.5  CBG within goal < 150  Albumin low, 2.3 (5/16), AST/ALT wnl (5/16)  IVF: NS at 50 ml/hr  Ppx: Protonix q12h  Nutritional Goals:   RD recs 5/15:  Kcal 1750-2050/day, Protein 90-105 g/day, Fluid 1.7-2L/day  Clinimix E 5/20 at goal of + MWF only lipids  20% 240 ml = average of 1896 kcal/d and 96 g protein/day  Plan:   Continue cyclic Clinimix E 5/20 1900 mL over 14 hours  Will not reduce protein (rate) for now despite elevated Scr since this is acute  Continue fat emulsion at 69ml/hr over 14 hours (MWF only due to ongoing shortage).  TNA to contain standard multivitamins and trace elements (MWF only due to ongoing shortage).  CBGs and Sensitive SSI Q8h, scheduled while TNA is running  TNA lab panels on Mondays & Thursdays.  BMET in am  Gwen Her PharmD  [phone] 06/03/2011 7:14 AM

## 2011-06-03 NOTE — Progress Notes (Signed)
Dr. Darnelle Catalan ordered for ascitic fluid that was drawn off during paracentesis earlier today to be sent for gram stain and culture.  Spoke with Brayton El, PA with radiology and he stated fluid had already been discarded prior to lab orders.  Dr. Darnelle Catalan notified and gram stain and culture orders discontinued.  [name] [facility]  06/03/2011  5:28 PM

## 2011-06-04 DIAGNOSIS — R188 Other ascites: Secondary | ICD-10-CM

## 2011-06-04 DIAGNOSIS — R509 Fever, unspecified: Secondary | ICD-10-CM

## 2011-06-04 DIAGNOSIS — R1033 Periumbilical pain: Secondary | ICD-10-CM

## 2011-06-04 DIAGNOSIS — N8502 Endometrial intraepithelial neoplasia [EIN]: Secondary | ICD-10-CM

## 2011-06-04 LAB — DIFFERENTIAL
Basophils Absolute: 0 10*3/uL (ref 0.0–0.1)
Eosinophils Absolute: 0 10*3/uL (ref 0.0–0.7)
Lymphocytes Relative: 6 % — ABNORMAL LOW (ref 12–46)
Monocytes Relative: 15 % — ABNORMAL HIGH (ref 3–12)
Neutro Abs: 6.8 10*3/uL (ref 1.7–7.7)
Neutrophils Relative %: 79 % — ABNORMAL HIGH (ref 43–77)

## 2011-06-04 LAB — COMPREHENSIVE METABOLIC PANEL
Albumin: 1.7 g/dL — ABNORMAL LOW (ref 3.5–5.2)
Alkaline Phosphatase: 105 U/L (ref 39–117)
BUN: 28 mg/dL — ABNORMAL HIGH (ref 6–23)
Calcium: 8.7 mg/dL (ref 8.4–10.5)
GFR calc Af Amer: 49 mL/min — ABNORMAL LOW (ref >90)
Glucose, Bld: 144 mg/dL — ABNORMAL HIGH (ref 70–99)
Potassium: 4.1 mEq/L (ref 3.5–5.1)
Sodium: 137 mEq/L (ref 135–145)
Total Protein: 5.8 g/dL — ABNORMAL LOW (ref 6.0–8.3)

## 2011-06-04 LAB — CBC
MCHC: 31.5 g/dL (ref 30.0–36.0)
Platelets: 143 10*3/uL — ABNORMAL LOW (ref 150–400)
RDW: 16.7 % — ABNORMAL HIGH (ref 11.5–15.5)
WBC: 8.6 10*3/uL (ref 4.0–10.5)

## 2011-06-04 LAB — CULTURE, BLOOD (ROUTINE X 2)
Culture  Setup Time: 201305170918
Culture: NO GROWTH
Culture: NO GROWTH

## 2011-06-04 LAB — GLUCOSE, CAPILLARY: Glucose-Capillary: 152 mg/dL — ABNORMAL HIGH (ref 70–99)

## 2011-06-04 LAB — MAGNESIUM: Magnesium: 1.8 mg/dL (ref 1.5–2.5)

## 2011-06-04 MED ORDER — INDOMETHACIN 25 MG SUPPOSITORY
25.0000 mg | Freq: Three times a day (TID) | RECTAL | Status: DC
  Filled 2011-06-04 (×3): qty 1

## 2011-06-04 MED ORDER — ALTEPLASE 2 MG IJ SOLR
2.0000 mg | Freq: Once | INTRAMUSCULAR | Status: AC
  Administered 2011-06-04: 2 mg
  Filled 2011-06-04: qty 2

## 2011-06-04 MED ORDER — CLINIMIX E/DEXTROSE (5/20) 5 % IV SOLN
INTRAVENOUS | Status: AC
  Administered 2011-06-04: 18:00:00 via INTRAVENOUS
  Filled 2011-06-04: qty 2000

## 2011-06-04 MED ORDER — FENTANYL 25 MCG/HR TD PT72
25.0000 ug | MEDICATED_PATCH | TRANSDERMAL | Status: DC
  Administered 2011-06-04: 25 ug via TRANSDERMAL
  Filled 2011-06-04: qty 1

## 2011-06-04 MED ORDER — ZOLPIDEM TARTRATE 5 MG PO TABS
5.0000 mg | ORAL_TABLET | Freq: Every evening | ORAL | Status: DC | PRN
  Administered 2011-06-04: 5 mg via ORAL
  Filled 2011-06-04: qty 1

## 2011-06-04 MED ORDER — INDOMETHACIN 50 MG RE SUPP
25.0000 mg | Freq: Three times a day (TID) | RECTAL | Status: DC
  Administered 2011-06-04 – 2011-06-06 (×6): 25 mg via RECTAL
  Filled 2011-06-04 (×9): qty 1

## 2011-06-04 NOTE — Progress Notes (Signed)
06/04/2011, 10:11 AM  Hospital day: 9 Antibiotics: day 8 vanc/zosyn, day 6 levaquin Chemotherapy: day 16 cycle 2 adriamycin  Subjective: More abdominal pain thru night and this am, and febrile again overnight. Abdomen less distended since paracentesis of 2 liters yesterday, this first time since 5-17 and prior to that paracentesis on 5-6. Cough improved, needed albuterol nebs only x 2 yesterday. Nausea and vomiting better since paracentesis. IV Dilaudid not helping as much with abdominal pain now; IV phenergan still somewhat helpful when nausea. Uncle here and the 3 of [place] have talked at [facility]; Mr Reddix asleep here. I have told patient that I cannot tell any real improvement since the most recent chemotherapy and clearly she had complications from febrile neutropenia related to that treatment. I do not believe that further chemotherapy in this situation would help the cancer or help her. I have suggested that she might want to try to be back at home with the children rather than spend her remaining time in hospital, and I have encouraged them to consider Hospice assistance. We have discussed possible goal of having some comfortable time at home with children and I have explained that hospice assistance often can allow patients to stay out of ED and out of hospital more effectively than is possible otherwise. I have shown uncle the CT AP images on EMR and discussed the diagnosis and course of her aggressive disease thus far. I have mentioned that TNA is not generally used if we are not trying to support thru more chemotherapy, and I have told her that this can nourish cancer more than nourishing patient at some point. I would consider peritoneal drain if she needs more frequent paracenteses, but at this point with G tube, PICC and PAC, I do not think one more tube would really be helpful. Patient has asked if I have ever seen a miracle with this type of cancer, and I have told her that I have not seen this.  I have assured her that we can continue all comfort interventions if she decides to try less aggressive care. I have recommended duragesic patch and indocin suppositories to try to help with fevers, those orders written. I have told her that antibiotics will continue until all cultures known ok.  I updated gyn oncology on situation yesterday.   Review of Systems otherwise: not more SOB or noticeably more weak with Hgb lower. Objective: Vital signs in last 24 hours: Blood pressure 109/75, pulse 113, temperature 99.5 F (37.5 C), temperature source Oral, resp. rate 19, height 5\' 6"  (1.676 m), weight 194 lb 12.8 oz (88.361 kg), last menstrual period 07/02/2010, SpO2 99.00%.  Alert, looks moderately uncomfortable, seems to follow conversation well and uncle seems to understand. Mouth moist without lesions. PERRL. No JVD. Lungs without wheezes or rales anteriorly. Cor RRR. PAC site ok, infusing without difficulty. PICC RUE site ok, infusing ok. No swelling RUE.Marland Kitchen Abdomen not as distended as prior to paracentesis, mildly tender thruout without clear rebound. G tube to suction, dressings dry. LE no edema, cords, tenderness. Moves easily in bed. No bleeding. Intake/Output from previous day: 05/22 0701 - 05/23 0700 In: 6608 [P.O.:960; I.V.:1992; TPN:3656] Out: 1350 [Drains:1350] Intake/Output this shift:      Lab Results:  Basename 06/04/11 0500 06/03/11 0654  WBC 8.6 10.4  HGB 7.6* 8.3*  HCT 24.1* 26.4*  PLT 143* 138*   BMET  Basename 06/04/11 0500 06/03/11 0654  NA 137 138  K 4.1 4.0  CL 102 105  CO2  25 24  GLUCOSE 144* 137*  BUN 28* 28*  CREATININE 1.49* 1.47*  CALCIUM 8.7 8.2*   No + cultures that I find to date Studies/Results: Ct Angio Chest W/cm &/or Wo Cm  06/02/2011  *RADIOLOGY REPORT*  Clinical Data: Spiking fevers with cough and shortness of breath. Recurrent endometrial cancer.  Evaluate for pulmonary embolism.  CT ANGIOGRAPHY CHEST  Technique:  Multidetector CT imaging  of the chest using the standard protocol during bolus administration of intravenous contrast. Multiplanar reconstructed images including MIPs were obtained and reviewed to evaluate the vascular anatomy.  Contrast: OMNIPAQUE IOHEXOL 300 MG/ML  SOLN  Comparison: Chest CTA 05/22/2011.  Findings: The pulmonary arteries are well opacified with contrast. There is no evidence of acute pulmonary embolism.  The thoracic aorta has a stable appearance.  Right IJ central venous catheter is stable in position.  There is an enlarging left pleural effusion which is now moderate in size.  This demonstrates no definite nodularity or abnormal enhancement.  There is no significant right-sided pleural effusion or pericardial effusion.  There is increased air space disease in the left lower lobe.  Right basilar atelectasis appears stable. There is a stable small left upper lobe nodule on image 35.  1.6 cm AP window lymph node on image 27 and 1.2 cm left hilar node on image 28 are unchanged.  There is no progressive lymphadenopathy. Mild thyroid nodularity appears unchanged.  Images through the upper abdomen again demonstrate ascites with possible peritoneal nodularity along the splenic flexure of the colon.  Low density hepatic lesions are unchanged.  IMPRESSION:  1.  No evidence of acute pulmonary embolism. 2.  Enlarging left pleural effusion with increasing left basilar air space disease, likely atelectasis. 3.  Upper abdominal ascites with possible peritoneal nodularity in the left upper quadrant, concerning for peritoneal metastatic disease.  The patient underwent paracentesis 05/29/2011. 4.  Stable mediastinal and left hilar adenopathy.  Original Report Authenticated By: Gerrianne Scale, M.D.   US Paracentesis  06/03/2011  *RADIOLOGY REPORT*  Clinical Data: Metastatic endometrial carcinoma, recurrent abdominal ascites  ULTRASOUND GUIDED PARACENTESIS  Comparison:  Previous paracentesis  An ultrasound guided paracentesis  was thoroughly discussed with the patient and questions answered.  The benefits, risks, alternatives and complications were also discussed.  The patient understands and wishes to proceed with the procedure.  Written consent was obtained.  Ultrasound was performed to localize and mark an adequate pocket of fluid in the left lateral quadrant of the abdomen.  The area was then prepped and draped in the normal sterile fashion.  1% Lidocaine was used for local anesthesia.  Under ultrasound guidance a 19 gauge Yueh catheter was introduced.  Paracentesis was performed.  The catheter was removed and a dressing applied.  Complications:  None  Findings:  A total of approximately 2.2 liters of amber colored fluid was removed.  A fluid sample was not sent for laboratory analysis.  IMPRESSION: Successful ultrasound guided paracentesis yielding 2.2 liters of ascites.  Read by Brayton El PA-C  Original Report Authenticated By: Osa Craver, M.D.   Dg Abd Acute W/chest  06/03/2011  *RADIOLOGY REPORT*  Clinical Data: Abdominal pain and distention.  ACUTE ABDOMEN SERIES (ABDOMEN 2 VIEW & CHEST 1 VIEW)  Comparison: 05/26/2011  Findings: Lung volumes are low. The cardiopericardial silhouette is enlarged.  There is interval increase in retrocardiac collapse / consolidation.  Right Port-A-Cath remains in place.  Right PICC line tip projects at the distal SVC level.  Right-sided decubitus view the abdomen shows no evidence for intraperitoneal free air.  Supine film shows no gaseous bowel dilatation to suggest obstruction.  There is scattered along the colon and oral contrast material in the colon may be residual from a CT scan of 05/18/2011.  Gastrostomy tube is evident.  IMPRESSION: Retrocardiac collapse / consolidation.  No evidence for bowel perforation or obstruction.  Original Report Authenticated By: ERIC A. MANSELL, M.D.     Assessment/Plan: 1. Rapidly recurrent, aggressive, metastatic high grade endometrial  carcinoma: I do not believe further chemotherapy should be attempted. Will ask Hospice to meet with patient and family in hopes that they can assist after discharge. Will try duragesic patch and tid indocin suppositories for now.  2.continues on TNA which we began as short term intervention to allow attempt at more chemotherapy. I have encouraged her to keep trying to clamp G tube after po fluids to allow some nutrition from that if possible.  3.continued fevers, source not clear: multiple lines/ drains, possible abdominal source vs lungs, possible tumor fever. Add indocin suppositories as above. 4.DNR patient's request. 5.progressive anemia: no apparent bleeding.  6. Difficult social situation  All help with care of this very unfortunate, lovely lady much appreciated. Johney Perotti P

## 2011-06-04 NOTE — Progress Notes (Signed)
Palliative Medicine Team consult received from Dr Darrold Span for hospice care consult; attempted to speak with Dr Darrold Span who is off today -spoke with Dr Darnelle Catalan, attending who agrees - spoke with patient at bedside who stated she would like for her mother to be able to participate in this discussion 'she needs to hear what is going on with me' patient wants to discussion of goals and hospice options  -a [place] interpretor will be arranged -meeting scheduled for tomorrow Friday 06/05/11 @ 2:00 pm  Valente David, RN 06/04/2011, 3:11 PM Palliative Medicine Team RN Liaison [phone]

## 2011-06-04 NOTE — Progress Notes (Addendum)
PROGRESS NOTE  Krystal [name] ZOX:[id number] DOB: 08/08/1970 DOA: 05/27/2011 PCP: [name], MD, MD  Brief narrative: Krystal [name] is a 41 year old female with endometrial carcinoma status post 2 cycles of chemotherapy, last given on 05/20/2011, whose treatment course has been complicated by a small bowel obstruction. During her previous hospital stay, a G-tube was placed for gastric drainage. She was admitted to the hospital on 05/27/2011 with fever in the setting of neutropenia.  Assessment/Plan: Principal Problem:  *Neutropenic fever in the setting of treatment for endometrial cancer, status post chemotherapy  Secondary to immunosuppressed status from malignancy and chemotherapy treatment.  Per oncology next chemotherapy was scheduled 3 weeks from 05/20/2011.  Started on empiric antibiotics with vancomycin and zosyn upon admission, which does provide broad spectrum coverage. With ongoing fever, could consider a switch to alternative therapy.  Unfortunately, cultures were not requested of the ascitic fluid and the lab no longer has the ascitic fluid that was drained earlier today. Adding empiric antifungal coverage is also a consideration. Patient does not have any specific complaints of abdominal pain and is only mildly tender on exam so doubt she has peritonitis. Question whether "tumor fever" may be playing a role.  Blood cultures negative to date, urine culture with insignificant growth.   Neupogen 300 mg x 1 dose given stat on admission according to Dr. Armando Gang notes. Not currently on this therapy.  Dr. Darrold Span is following the patient's course as well.  Hospice consultation requested by Dr. Darrold Span 06/04/2011 with goals of care meeting scheduled for 06/05/2011.  Duragesic patch and 3 times a day Indocin suppositories ordered for pain control. Active Problems:  Partial small bowel obstruction with inadequate oral intake  Treated conservatively with bowel rest and G.-tube  decompression of the stomach.   Evaluated by surgery during previous admission, not felt to be a surgical candidate.  TNA per pharmacy for nutritional support.  This is a temporary measure and should probably be stopped soon.  Chronic constipation  Continue bowel regimen as tolerated.  Ascites  Recurrent malignant ascites noted. Patient underwent successful ultrasound-guided paracentesis yielding 2.2 L of amber fluid on 06/03/2011.  HTN (hypertension)  Blood pressure controlled on a clonidine patch.  Thrombocytopenia  Platelet count stable with no signs of bleeding. Likely from the sequela of prior chemotherapy.  ARF (acute renal failure) with history of Hydronephrosis  Creatinine slowly rising over time with admission creatinine of 1.06.  Anemia of chronic disease  Hemoglobin 7.6-8.4. Likely a combination of anemia of chronic disease as well as the sequela of prior chemotherapy.  Hypokalemia  Electrolytes being monitored closely with replacement as needed.  Code Status: DNR Family Communication: None at bedside. Disposition Plan: Home, when stable.  Medical Consultants:  Dr. [place] Flavors, Oncology  Other consultants:  Pharmacy for TNA and antibiotic dosing.  Antibiotics: Zosyn 05/27/2011--->  Vancomycin 05/27/11--->   Subjective  Krystal [name] denies required some IV pain medicine for complaints of abdominal pain. She states her bowels moved today. She reports some white to yellow drainage around her PEG tube site.   Objective    Interim History: Stable overnight.   Objective: Filed Vitals:   06/04/11 0500 06/04/11 0522 06/04/11 1401 06/04/11 1435  BP:  109/75 111/81   Pulse:  113 107   Temp:  99.5 F (37.5 C) 98.3 F (36.8 C)   TempSrc:  Oral Oral   Resp:  19 16   Height:      Weight: 88.361 kg (194 lb 12.8 oz)  SpO2:  99% 99% 97%    Intake/Output Summary (Last 24 hours) at 06/04/11 1517 Last data filed at 06/04/11 0500  Gross per 24 hour    Intake   6128 ml  Output    750 ml  Net   5378 ml    Exam: Gen:  NAD Cardiovascular:  RRR, No M/R/G Respiratory: Lungs CTAB Gastrointestinal: Abdomen softly disteended, no rebound or guarding.  PEG tube present. Extremities: No C/E/C    Data Reviewed: Basic Metabolic Panel:  Lab 06/04/11 1610 06/03/11 0654 06/01/11 0455 05/31/11 1223 05/30/11 0515  NA 137 138 138 137 137  K 4.1 4.0 -- -- --  CL 102 105 104 102 102  CO2 25 24 24 24 26   GLUCOSE 144* 137* 117* 97 130*  BUN 28* 28* 29* 28* 24*  CREATININE 1.49* 1.47* 1.55* 1.54* 1.61*  CALCIUM 8.7 8.2* 8.5 8.4 8.6  MG 1.8 -- 1.7 -- --  PHOS 4.3 -- 3.0 -- --   GFR Estimated Creatinine Clearance: 54.5 ml/min (by C-G formula based on Cr of 1.49). Liver Function Tests:  Lab 06/04/11 0500 06/01/11 0455  AST 11 13  ALT 10 10  ALKPHOS 105 91  BILITOT 0.2* 0.3  PROT 5.8* 6.0  ALBUMIN 1.7* 1.8*    CBC:  Lab 06/04/11 0500 06/03/11 0654 06/02/11 0510 06/01/11 0455 05/31/11 1223  WBC 8.6 10.4 7.2 2.8* 1.4*  NEUTROABS 6.8 7.9* 5.6 1.7 0.6*  HGB 7.6* 8.3* 8.4* 7.6* 8.2*  HCT 24.1* 26.4* 26.8* 24.6* 25.6*  MCV 85.8 85.7 85.6 84.8 83.9  PLT 143* 138* 99* 93* 86*   CBG:  Lab 06/04/11 0636 06/04/11 0012 06/03/11 1751 06/03/11 0550 06/03/11 0053  GLUCAP 152* 151* 104* 162* 156*   Lipid Profile No results found for this basename: CHOL:2,HDL:2,LDLCALC:2,TRIG:2,CHOLHDL:2,LDLDIRECT:2 in the last 72 hours Microbiology Recent Results (from the past 240 hour(s))  URINE CULTURE     Status: Normal   Collection Time   05/27/11  7:00 AM      Component Value Range Status Comment   Specimen Description URINE, CLEAN CATCH   Final    Special Requests NONE   Final    Culture  Setup Time [id number]   Final    Colony Count 8,000 COLONIES/ML   Final    Culture INSIGNIFICANT GROWTH   Final    Report Status 05/28/2011 FINAL   Final   CULTURE, BLOOD (ROUTINE X 2)     Status: Normal   Collection Time   05/27/11  7:15 AM      Component  Value Range Status Comment   Specimen Description BLOOD LEFT ANTECUBITAL   Final    Special Requests BOTTLES DRAWN AEROBIC AND ANAEROBIC   Final    Culture  Setup Time [id number]   Final    Culture NO GROWTH 5 DAYS   Final    Report Status 06/02/2011 FINAL   Final   CULTURE, BLOOD (ROUTINE X 2)     Status: Normal   Collection Time   05/27/11  9:25 AM      Component Value Range Status Comment   Specimen Description BLOOD RIGHT PORT   Final    Special Requests BOTTLES DRAWN AEROBIC AND ANAEROBIC [facility]   Final    Culture  Setup Time [id number]   Final    Culture NO GROWTH 5 DAYS   Final    Report Status 06/02/2011 FINAL   Final   CULTURE, BLOOD (ROUTINE X 2)     Status:  Normal   Collection Time   05/29/11  7:00 AM      Component Value Range Status Comment   Specimen Description BLOOD LEFT HAND   Final    Special Requests BOTTLES DRAWN AEROBIC AND ANAEROBIC 5CC   Final    Culture  Setup Time [id number]   Final    Culture NO GROWTH 5 DAYS   Final    Report Status 06/04/2011 FINAL   Final   CULTURE, BLOOD (ROUTINE X 2)     Status: Normal   Collection Time   05/29/11  7:10 AM      Component Value Range Status Comment   Specimen Description BLOOD LEFT HAND   Final    Special Requests BOTTLES DRAWN AEROBIC AND ANAEROBIC [facility]   Final    Culture  Setup Time [id number]   Final    Culture NO GROWTH 5 DAYS   Final    Report Status 06/04/2011 FINAL   Final     Procedures and Diagnostic Studies:   US Paracentesis 06/03/2011 IMPRESSION: Successful ultrasound guided paracentesis yielding 2.2 liters of ascites.  Read by Brayton El PA-C  Original Report Authenticated By: Osa Craver, M.D.    Dg Abd Acute W/chest 06/03/2011 IMPRESSION: Retrocardiac collapse / consolidation.  No evidence for bowel perforation or obstruction.  Original Report Authenticated By: ERIC A. MANSELL, M.D.    Ct Angio Chest W/cm &/or Wo Cm 06/02/2011 IMPRESSION:  1.  No evidence of acute pulmonary  embolism. 2.  Enlarging left pleural effusion with increasing left basilar air space disease, likely atelectasis. 3.  Upper abdominal ascites with possible peritoneal nodularity in the left upper quadrant, concerning for peritoneal metastatic disease.  The patient underwent paracentesis 05/29/2011. 4.  Stable mediastinal and left hilar adenopathy.  Original Report Authenticated By: Gerrianne Scale, M.D.    Dg Chest 2 View 06/01/2011  IMPRESSION:  Bibasilar subsegmental atelectasis but no focal infiltrate or effusion.  Original Report Authenticated By: P. Loralie Champagne, M.D.    Dg Chest 2 View 05/31/2011 IMPRESSION: Mild bibasilar airspace disease is present which may represent atelectasis or pneumonia.  No significant interval change.  Original Report Authenticated By: Camelia Phenes, M.D.    Dg Chest Port 1v Same Day 05/29/2011 IMPRESSION: Increased retrocardiac opacity could reflect worsening basilar atelectasis or infection/pneumonia.  Original Report Authenticated By: Harley Hallmark, M.D.    Dg Chest 2 View 05/27/2011  IMPRESSION: Shallow inspiration with bilateral basilar atelectasis.  Cardiac enlargement.  No significant interval change.  Original Report Authenticated By: Marlon Pel, M.D.   Dg Chest Port 1v Same Day 05/29/2011 IMPRESSION: Increased retrocardiac opacity could reflect worsening basilar atelectasis or infection/pneumonia.  Original Report Authenticated By: Harley Hallmark, M.D.    Dg Abd Acute W/chest 05/26/2011  IMPRESSION: Resolving but not yet cleared small bowel obstruction.  Bibasilar atelectasis.  Original Report Authenticated By: Elsie Stain, M.D.    Scheduled Meds:    . cloNIDine  0.1 mg Transdermal Weekly  . enoxaparin  40 mg Subcutaneous Q24H  . feeding supplement  237 mL Oral BID BM  . fentaNYL  25 mcg Transdermal Q72H  . indomethacin  25 mg Rectal TID  . insulin aspart  0-9 Units Subcutaneous TID  . lactulose  30 g Per Tube TID  . pantoprazole  (PROTONIX) IV  40 mg Intravenous Q12H  . piperacillin-tazobactam (ZOSYN)  IV  3.375 g Intravenous Q8H  . sodium chloride  10 mL Intravenous Q12H  .  vancomycin  750 mg Intravenous Q12H  . DISCONTD: indomethacin  25 mg Rectal TID  . DISCONTD: sodium chloride  3 mL Intravenous Q12H   Continuous Infusions:    . sodium chloride 50 mL/hr at 06/04/11 0923  . fat emulsion 192 kcal (06/04/11 0500)  . TPN (CLINIMIX) +/- additives    . TPN (CLINIMIX) +/- additives 150 mL/hr at 06/04/11 0500      LOS: 8 days   Hillery Aldo, MD Pager [phone]  06/04/2011, 3:17 PM

## 2011-06-04 NOTE — Progress Notes (Signed)
Purple port of PICC line clotted and unable to flush.  Unable to instill TPA after 3 attempts. Red port has good blood return and flushes easily. Might consider exchanging PICC if still clotted tomorrow due to increased risk of infection

## 2011-06-04 NOTE — Progress Notes (Signed)
ANTIBIOTIC CONSULT NOTE - FOLLOW UP  Pharmacy Consult for Vancomycin Indication: Neutropenic fever  No Known Allergies  Patient Measurements: Height: 5\' 6"  (167.6 cm) Weight: 199 lb 3.2 oz (90.357 kg) IBW/kg (Calculated) : 59.3  Adjusted Body Weight:   Vital Signs: Temp: 99.3 F (37.4 C) (05/22 2023) Temp src: Oral (05/22 2023) BP: 109/75 mmHg (05/22 2023) Pulse Rate: 107  (05/22 2023) Intake/Output from previous day: 05/22 0701 - 05/23 0700 In: 960 [P.O.:960] Out: 1200 [Drains:1200] Intake/Output from this shift: Total I/O In: 480 [P.O.:480] Out: 600 [Drains:600]  Labs:  [facility] 06/03/11 0654 06/02/11 0510 06/01/11 0455  WBC 10.4 7.2 2.8*  HGB 8.3* 8.4* 7.6*  PLT 138* 99* 93*  LABCREA -- -- --  CREATININE 1.47* -- 1.55*   Estimated Creatinine Clearance: 55.9 ml/min (by C-G formula based on Cr of 1.47).  Basename 06/04/11 0216  VANCOTROUGH 20.2*  VANCOPEAK --  Drue Dun --  GENTTROUGH --  GENTPEAK --  GENTRANDOM --  TOBRATROUGH --  TOBRAPEAK --  TOBRARND --  AMIKACINPEAK --  AMIKACINTROU --  AMIKACIN --     Microbiology: Recent Results (from the past 720 hour(s))  URINE CULTURE     Status: Normal   Collection Time   05/27/11  7:00 AM      Component Value Range Status Comment   Specimen Description URINE, CLEAN CATCH   Final    Special Requests NONE   Final    Culture  Setup Time [id number]   Final    Colony Count 8,000 COLONIES/ML   Final    Culture INSIGNIFICANT GROWTH   Final    Report Status 05/28/2011 FINAL   Final   CULTURE, BLOOD (ROUTINE X 2)     Status: Normal   Collection Time   05/27/11  7:15 AM      Component Value Range Status Comment   Specimen Description BLOOD LEFT ANTECUBITAL   Final    Special Requests BOTTLES DRAWN AEROBIC AND ANAEROBIC   Final    Culture  Setup Time [id number]   Final    Culture NO GROWTH 5 DAYS   Final    Report Status 06/02/2011 FINAL   Final   CULTURE, BLOOD (ROUTINE X 2)     Status: Normal   Collection Time   05/27/11  9:25 AM      Component Value Range Status Comment   Specimen Description BLOOD RIGHT PORT   Final    Special Requests BOTTLES DRAWN AEROBIC AND ANAEROBIC [facility]   Final    Culture  Setup Time [id number]   Final    Culture NO GROWTH 5 DAYS   Final    Report Status 06/02/2011 FINAL   Final   CULTURE, BLOOD (ROUTINE X 2)     Status: Normal (Preliminary result)   Collection Time   05/29/11  7:00 AM      Component Value Range Status Comment   Specimen Description BLOOD LEFT HAND   Final    Special Requests BOTTLES DRAWN AEROBIC AND ANAEROBIC 5CC   Final    Culture  Setup Time [id number]   Final    Culture     Final    Value:        BLOOD CULTURE RECEIVED NO GROWTH TO DATE CULTURE WILL BE HELD FOR 5 DAYS BEFORE ISSUING A FINAL NEGATIVE REPORT   Report Status PENDING   Incomplete   CULTURE, BLOOD (ROUTINE X 2)     Status: Normal (Preliminary result)   Collection Time  05/29/11  7:10 AM      Component Value Range Status Comment   Specimen Description BLOOD LEFT HAND   Final    Special Requests BOTTLES DRAWN AEROBIC AND ANAEROBIC [facility]   Final    Culture  Setup Time [id number]   Final    Culture     Final    Value:        BLOOD CULTURE RECEIVED NO GROWTH TO DATE CULTURE WILL BE HELD FOR 5 DAYS BEFORE ISSUING A FINAL NEGATIVE REPORT   Report Status PENDING   Incomplete     Anti-infectives     Start     Dose/Rate Route Frequency Ordered Stop   05/31/11 1000   fluconazole (DIFLUCAN) IVPB 100 mg  Status:  Discontinued        100 mg 50 mL/hr over 60 Minutes Intravenous Every 24 hours 05/31/11 0825 06/03/11 0901   05/30/11 0000   vancomycin (VANCOCIN) 750 mg in sodium chloride 0.9 % 150 mL IVPB        750 mg 150 mL/hr over 60 Minutes Intravenous Every 12 hours 05/29/11 1308     05/29/11 1500   levofloxacin (LEVAQUIN) IVPB 750 mg        750 mg 100 mL/hr over 90 Minutes Intravenous Every 24 hours 05/29/11 1402 05/31/11 1610   05/29/11 1415   ceFEPIme (MAXIPIME) 1  g in dextrose 5 % 50 mL IVPB  Status:  Discontinued        1 g 100 mL/hr over 30 Minutes Intravenous 3 times per day 05/29/11 1402 05/29/11 1403   05/29/11 1000   metroNIDAZOLE (FLAGYL) IVPB 500 mg  Status:  Discontinued     Comments: Pharmacy to dose please      500 mg 100 mL/hr over 60 Minutes Intravenous Every 8 hours 05/29/11 0909 05/29/11 1432   05/28/11 1200   vancomycin (VANCOCIN) 1,250 mg in sodium chloride 0.9 % 250 mL IVPB  Status:  Discontinued        1,250 mg 166.7 mL/hr over 90 Minutes Intravenous Every 12 hours 05/28/11 0846 05/29/11 1308   05/27/11 1800   vancomycin (VANCOCIN) IVPB 1000 mg/200 mL premix  Status:  Discontinued        1,000 mg 200 mL/hr over 60 Minutes Intravenous Every 8 hours 05/27/11 1135 05/28/11 0846   05/27/11 1400  piperacillin-tazobactam (ZOSYN) IVPB 3.375 g       3.375 g 12.5 mL/hr over 240 Minutes Intravenous 3 times per day 05/27/11 1135     05/27/11 0645   vancomycin (VANCOCIN) IVPB 1000 mg/200 mL premix  Status:  Discontinued        1,000 mg 200 mL/hr over 60 Minutes Intravenous  Once 05/27/11 0622 05/27/11 1101   05/27/11 0645  piperacillin-tazobactam (ZOSYN) IVPB 3.375 g       3.375 g 100 mL/hr over 30 Minutes Intravenous  Once 05/27/11 0622 05/27/11 0720          Assessment: Patient with level just above goal (20.2).  0300 dose not infused yet.   Goal of Therapy:  Vancomycin trough level 15-20 mcg/ml  Plan:  Measure antibiotic drug levels at steady state Follow up culture results I feel that 0.2 above is okay, SCr has recent started to trend down, and would rather be high vs low for neutorpenic fever. Will keep on same dose for now, consider recheck level sooner vs later.  Krystal [name], Krystal [name] 06/04/2011,3:44 AM

## 2011-06-04 NOTE — Progress Notes (Signed)
PARENTERAL NUTRITION CONSULT NOTE - Follow up  Pharmacy Consult for TNA Indication: Hx of pSBO  No Known Allergies  Patient Measurements: Height: 5\' 6"  (167.6 cm) Weight: 194 lb 12.8 oz (88.361 kg) IBW/kg (Calculated) : 59.3   Vital Signs: Temp: 99.5 F (37.5 C) (05/23 0522) Temp src: Oral (05/23 0522) BP: 109/75 mmHg (05/23 0522) Pulse Rate: 113  (05/23 0522) Intake/Output from previous day: 05/22 0701 - 05/23 0700 In: 6608 [P.O.:960; I.V.:1992; TPN:3656] Out: 1350 [Drains:1350]  Labs:  [facility] 06/04/11 0500 06/03/11 0654 06/02/11 0510  WBC 8.6 10.4 7.2  HGB 7.6* 8.3* 8.4*  HCT 24.1* 26.4* 26.8*  PLT 143* 138* 99*  APTT -- -- --  INR -- -- --     Basename 06/04/11 0500 06/03/11 0654  NA 137 138  K 4.1 4.0  CL 102 105  CO2 25 24  GLUCOSE 144* 137*  BUN 28* 28*  CREATININE 1.49* 1.47*  LABCREA -- --  CREAT24HRUR -- --  CALCIUM 8.7 8.2*  MG 1.8 --  PHOS 4.3 --  PROT 5.8* --  ALBUMIN 1.7* --  AST 11 --  ALT 10 --  ALKPHOS 105 --  BILITOT 0.2* --  BILIDIR -- --  IBILI -- --  PREALBUMIN -- --  TRIG -- --  CHOLHDL -- --  CHOL -- --   Estimated Creatinine Clearance: 54.5 ml/min (by C-G formula based on Cr of 1.49).    Basename 06/04/11 0636 06/04/11 0012 06/03/11 1751  GLUCAP 152* 151* 104*   Medications:  Scheduled:     . cloNIDine  0.1 mg Transdermal Weekly  . enoxaparin  40 mg Subcutaneous Q24H  . feeding supplement  237 mL Oral BID BM  . insulin aspart  0-9 Units Subcutaneous TID  . lactulose  30 g Per Tube TID  . pantoprazole (PROTONIX) IV  40 mg Intravenous Q12H  . piperacillin-tazobactam (ZOSYN)  IV  3.375 g Intravenous Q8H  . sodium chloride  10 mL Intravenous Q12H  . vancomycin  750 mg Intravenous Q12H  . DISCONTD: fluconazole (DIFLUCAN) IV  100 mg Intravenous Q24H  . DISCONTD: sodium chloride  3 mL Intravenous Q12H   Infusions:     . sodium chloride 50 mL/hr at 06/04/11 0500  . fat emulsion 192 kcal (06/04/11 0500)  . TPN  (CLINIMIX) +/- additives 50 mL/hr at 06/02/11 1848  . TPN (CLINIMIX) +/- additives 150 mL/hr at 06/04/11 0500   Insulin Requirements in the past 24 hours:  CBGs: 72-173 Insulin: 5 units  Current Nutrition:  TNA: Clinimix E 5/20 cyclic over 14 hours (50 ml/hr from 6pm-7pm, 150 ml/hr from 7pm-7am, and 50 ml/hr from 7am-8am.  Off from 8am until 6pm) Diet: Full liquid; po intake increased, stooling  Assessment:  41YOF w/TNA started during previous admission (4/25) for partial SBO (resolving per Xray 5/15). Changed to cyclic at the end of previous admission and d/c'd on cyclic TNA. Pt readmitted 5/15 and cyclicTNA resumed  Electrolytes:  WNL today. Scr continues ~ 1.5  CBG within goal < 150  LFT and TBili wnl today  Prealbumin 9.1 (5/20), today's result pending  Lipid profile wnl 5/20, todday's results pending  IVF: NS at 50 ml/hr  PPX: Protonix q12h  Nutritional Goals:   RD recs 5/15:  Kcal 1750-2050/day, Protein 90-105 g/day, Fluid 1.7-2L/day  Clinimix E 5/20 at goal of + MWF only lipids  20% 240 ml = average of 1896 kcal/d and 96 g protein/day  Plan:   Continue cyclic Clinimix E  5/20 1900 mL over 14 hours  Will not reduce protein (rate) for now despite elevated Scr since this is acute and Scr trended down and holding at ~1.5  Continue fat emulsion at 70ml/hr over 14 hours (MWF only due to ongoing shortage).  TNA to contain standard multivitamins and trace elements (MWF only due to ongoing shortage).  CBGs and Sensitive SSI Q8h, scheduled while TNA is running  TNA lab panels on Mondays & Thursdays.  BMET in am  Gwen Her PharmD  [phone] 06/04/2011 7:22 AM

## 2011-06-05 ENCOUNTER — Encounter: Payer: Self-pay | Admitting: Oncology

## 2011-06-05 DIAGNOSIS — R188 Other ascites: Secondary | ICD-10-CM

## 2011-06-05 DIAGNOSIS — R112 Nausea with vomiting, unspecified: Secondary | ICD-10-CM

## 2011-06-05 DIAGNOSIS — R509 Fever, unspecified: Secondary | ICD-10-CM

## 2011-06-05 DIAGNOSIS — R627 Adult failure to thrive: Secondary | ICD-10-CM

## 2011-06-05 DIAGNOSIS — R1033 Periumbilical pain: Secondary | ICD-10-CM

## 2011-06-05 DIAGNOSIS — N8502 Endometrial intraepithelial neoplasia [EIN]: Secondary | ICD-10-CM

## 2011-06-05 DIAGNOSIS — R109 Unspecified abdominal pain: Secondary | ICD-10-CM

## 2011-06-05 LAB — CBC
HCT: 23 % — ABNORMAL LOW (ref 36.0–46.0)
MCV: 86.1 fL (ref 78.0–100.0)
RBC: 2.67 MIL/uL — ABNORMAL LOW (ref 3.87–5.11)
RDW: 16.7 % — ABNORMAL HIGH (ref 11.5–15.5)
WBC: 7.9 10*3/uL (ref 4.0–10.5)

## 2011-06-05 LAB — DIFFERENTIAL
Basophils Absolute: 0 10*3/uL (ref 0.0–0.1)
Basophils Relative: 0 % (ref 0–1)
Eosinophils Relative: 0 % (ref 0–5)
Lymphocytes Relative: 6 % — ABNORMAL LOW (ref 12–46)
Lymphs Abs: 0.5 10*3/uL — ABNORMAL LOW (ref 0.7–4.0)
Monocytes Relative: 17 % — ABNORMAL HIGH (ref 3–12)
Neutro Abs: 6.1 10*3/uL (ref 1.7–7.7)

## 2011-06-05 LAB — GLUCOSE, CAPILLARY
Glucose-Capillary: 142 mg/dL — ABNORMAL HIGH (ref 70–99)
Glucose-Capillary: 77 mg/dL (ref 70–99)

## 2011-06-05 MED ORDER — FAT EMULSION 20 % IV EMUL
250.0000 mL | INTRAVENOUS | Status: DC
  Filled 2011-06-05 (×2): qty 250

## 2011-06-05 MED ORDER — TRACE MINERALS CR-CU-MN-SE-ZN 10-1000-500-60 MCG/ML IV SOLN
INTRAVENOUS | Status: DC
  Filled 2011-06-05: qty 2000

## 2011-06-05 NOTE — Progress Notes (Signed)
PARENTERAL NUTRITION CONSULT NOTE - Follow up  Pharmacy Consult for TNA Indication: Hx of pSBO  No Known Allergies  Patient Measurements: Height: 5\' 6"  (167.6 cm) Weight: 200 lb (90.719 kg) IBW/kg (Calculated) : 59.3   Vital Signs: Temp: 99 F (37.2 C) (05/24 0625) Temp src: Oral (05/24 0625) BP: 102/74 mmHg (05/24 0625) Pulse Rate: 101  (05/24 0625) Intake/Output from previous day: 05/23 0701 - 05/24 0700 In: 3990 [I.V.:1518; IV Piggyback:680; TPN:1792] Out: -   Labs:  Basename 06/05/11 0715 06/04/11 0500 06/03/11 0654  WBC 7.9 8.6 10.4  HGB 7.0* 7.6* 8.3*  HCT 23.0* 24.1* 26.4*  PLT 199 143* 138*  APTT -- -- --  INR -- -- --     Basename 06/04/11 0500 06/03/11 0654  NA 137 138  K 4.1 4.0  CL 102 105  CO2 25 24  GLUCOSE 144* 137*  BUN 28* 28*  CREATININE 1.49* 1.47*  LABCREA -- --  CREAT24HRUR -- --  CALCIUM 8.7 8.2*  MG 1.8 --  PHOS 4.3 --  PROT 5.8* --  ALBUMIN 1.7* --  AST 11 --  ALT 10 --  ALKPHOS 105 --  BILITOT 0.2* --  BILIDIR -- --  IBILI -- --  PREALBUMIN -- --  TRIG -- --  CHOLHDL -- --  CHOL -- --   Estimated Creatinine Clearance: 55.3 ml/min (by C-G formula based on Cr of 1.49).    Basename 06/05/11 0614 06/05/11 0025 06/04/11 1740  GLUCAP 142* 133* 85   Medications:  Scheduled:     . alteplase  2 mg Intracatheter Once  . cloNIDine  0.1 mg Transdermal Weekly  . enoxaparin  40 mg Subcutaneous Q24H  . feeding supplement  237 mL Oral BID BM  . fentaNYL  25 mcg Transdermal Q72H  . indomethacin  25 mg Rectal TID  . insulin aspart  0-9 Units Subcutaneous TID  . lactulose  30 g Per Tube TID  . pantoprazole (PROTONIX) IV  40 mg Intravenous Q12H  . sodium chloride  10 mL Intravenous Q12H  . DISCONTD: piperacillin-tazobactam (ZOSYN)  IV  3.375 g Intravenous Q8H  . DISCONTD: vancomycin  750 mg Intravenous Q12H   Infusions:     . sodium chloride 50 mL/hr at 06/05/11 0653  . fat emulsion 192 kcal (06/04/11 0500)  . TPN  (CLINIMIX) +/- additives     And  . fat emulsion    . TPN (CLINIMIX) +/- additives 50 mL/hr at 06/04/11 1802   Insulin Requirements in the past 24 hours:  CBGs: 79-159 Insulin: 3 units  Current Nutrition:  TNA: Clinimix E 5/20 cyclic over 14 hours (50 ml/hr from 6pm-7pm, 150 ml/hr from 7pm-7am, and 50 ml/hr from 7am-8am.  Off from 8am until 6pm) Diet: Full liquid; po intake increased, stooling  Assessment:  41YOF w/TNA started during previous admission (4/25) for partial SBO (resolving per Xray 5/15). Changed to cyclic at the end of previous admission and d/c'd on cyclic TNA. Pt readmitted 5/15 and cyclic TNA resumed  Labs yesterday.Electrolytes:  WNL. Scr continues ~ 1.5  CBG within goal < 150  IVF: NS at 50 ml/hr  Ppx: Protonix q12h  Nutritional Goals:   RD recs 5/15:  Kcal 1750-2050/day, Protein 90-105 g/day, Fluid 1.7-2L/day  Clinimix E 5/20 at goal of + MWF only lipids  20% 240 ml = average of 1896 kcal/d and 96 g protein/day  Plan:   Continue cyclic Clinimix E 5/20 1900 mLand Fat emulsion at 48ml/hr over 14 hours (  MWF only due to ongoing shortage).  Plan home with hospice after holiday, plan to wean TNA over weekend.  TNA to contain standard multivitamins and trace elements (MWF only due to ongoing shortage).  CBGs and Sensitive SSI Q8h, scheduled while TNA is running  TNA lab panels on Mondays & Thursdays.   Otho Bellows PharmD  06/05/2011 3:37 PM

## 2011-06-05 NOTE — Progress Notes (Signed)
Night Chaplain paged at 1823 and arrived in unit at 59. After consultation with Palliative Care/Hospice RN and Room Nurses, I proceeded to the pt room. Krystal [name] spoke with a low voice indicating she was in some pain. She was surrounded by family and friends who were assisting her with her needs. We spoke of her diagnosis and the fact that she will die in the near future. She is accepting of this reality, but is very afraid. This fear is largely for the welfare of her daughters, but also the specter of dying long before she expected to die. She asked for everyone who will to pray for her to live for three months, so that she can plan for her daughters' future. Her request for God to provide three months comes in light of her concerns for her daughters future. Krystal [name] felt that three months was a modest request, rather than for prayers for a cure or recovery she feels will not come. In reference to this we discussed what projects she planned for those months. One suggestion was to write letters to her daughters, expressing her love for them, her dreams for them and her prayers for their meeting and marrying "good moral men."  As we discussed these projects Krystal [name]'s affect turned more peaceful, more focused and less shattered. She appears to need to have goals and visions to assist her bear out her short time before death.  Prayer prayed for God to assist medical staff in providing all the time possible for Krystal [name] to complete her projects of care for her daughters.   Krystal [name] says her care has been excellent. She says Social Work consults have been a blessing and has reduced her fears somewhat. Palliative Care's gentle care is a comfort to this young mother.  Krystal. [name] is a practicing Christian. She reports she is receiving good care and support from her community of faith.  Follow-up chaplain care needed to ensure holistic care for her mind, body and spirit. Care givers please alert chaplain of  any times when spiritual care will assist Krystal [name]. Please page the chaplain if Krystal [name] needs to talk or to be prayed for.  [name] D Antwane Grose. APC, D.Min. Chaplain 7:43 PM   06/05/2011

## 2011-06-05 NOTE — Progress Notes (Addendum)
Notified by Krystal [name] CMRN, patient and family request services of [facility]) after discharge. Patient information reviewed with Dr Elliot Gurney, [facility] Medical Director hospice eligible with dx: Endometrial cancer.  Spoke with patient Krystal [name], Krystal [name] and friend Krystal [name] who acted as Engineer, technical sales for [name] to initiate education related to hospice services, philosophy and team approach to care with understanding voiced by patient and [name] via interpretor.  No firm discharge date as of this entry - patient voiced there is consideration of an abdominal drain placement for recurrent ascites prior to discharge; she hopes this will be done this weekend.    Patient feels she is strong enough to d/c by personal vehicle and will need travel oxygen tank - currently patient on 2 LNC continuous and per patient report gets SOB with exertion  Discussed DME needs- currently has [employer] suction set-up - requests 1) complete Pkg A which includes electric hospital bed with AP&P mattress; over-bed table; wheel-chair; 3n1 BSC; walker w wheels and tub seat w back; and 2) complete Oxygen pkg B currently O2 @ 2LNC continuous.  Initial paperwork faxed to [facility] in anticipation of possible weekend d/c when medically stable. Completed d/c summary will need to be faxed to [facility] @ [phone] when final Please notify [facility] when patient is ready to leave unit at d/c [phone] on weekend; [facility] information and contact numbers also given to patient and [name] during visit.   Krystal [name] [facility] representative called and notified of DME requests and possibly placing the DME on hold until a d/c date could be determined. Krystal [name] CMRN had previously informed the weekend [facility] will follow up as needed  Patient stated - because of language issues patient's PCP her [name] speaks [place] -patient wants [facility] to contact her (patient) directly to set delivery time for equipment  and she will make sure someone is at the home to receive the DME -patient has friends/other family who are supportive and understand English and will help with information flow to her [name] Please call with any questions or concerns   Krystal David, RN 06/05/2011, 6:04 PM [facility] Palliative Medicine Team RN Liaison [phone]

## 2011-06-05 NOTE — Progress Notes (Signed)
UR complete 

## 2011-06-05 NOTE — Clinical Social Work Note (Signed)
[facility]  Clinical Social Work  Clinical Social Work participated in Tour manager of Care meeting today.  During meeting, patient was accompanied by two PMT providers, myself, patient's significant other, patient's mother, three close friends.  The PMT addressed patient's current condition and prognosis in depth and allowed family members to ask many questions.  The patient expressed understanding and is agreeable to Hospice.  The patient has chosen [facility] as her provider and CSW notified Lorenda Ishihara, CM.  The patient shared she is "scared", she states she "is scared for her children" and "scared to die young".  The team assured patient that Hospice would be able to provide emotional and practical support to family.  Ms. Bragdon would like to discharge home and understands residential Hospice is an option if she is unable to receive the care she needs at home.  She has designated her mother as her primary caregiver, however, her mother does not speak [place].  Ms. Hehr has many friends/"family" that may be able to assist.    CSW will call [facility] (patient's children are currently on the waiting list at [facility]) to determine if counselors could begin working with the children.  Also, CSW spoke with patient's friend Dewitt Rota who is helping patient with living will and other legal matters. CSW will assist [place] and connect her with [facility] for support.  Kathrin Penner, MSW, [facility] Clinical Social Worker [facility] [phone]

## 2011-06-05 NOTE — Consult Note (Signed)
Consult Note from the Palliative Medicine Team at [facility] Patient RU:EAVWUJ Krystal [name]      DOB: 1970/01/29      WJX:[id number]   Consult Requested by: Dr Salome Arnt                            PCP: [name], MD, MD Reason for Consultation:Goals of Care/Symptom Mangement    Phone Number:[phone]   Reviewed chart, spoke with staff carrying for patient, and Dr Sharl Ma spoke with Dr Darrold Span from Oncology prior to family meeting for Goals of Care today. The patient and family members present are from the [place], patient speaks fluent English, but an family friend acted as an Equities trader mainly for interaction with the patients mother who still lives in that there. Other people present was a friend, and the father of patients 2/3 children. The author of this note and Dr Mauro Kaufmann were present from PMT, also Saul Fordyce CSW  Also in attendance.  We illicited from the patient the events leading up to her diagnosis of endometrial cancer, her treatment history and her understanding of the current extent of the cancer at this point. She also added that she understood that there were no further medical or surgical interventions to control the cancers growth. Dr Sharl Ma reviewed the most recent CT scan and explained to patient and those in attendance what parts of the body were diagnostically identified as being infiltrated by the cancer. A discussion was directed about the path of care the patient would prefer at this point, which included the risks/benefits of any medical interventions that may be used including resuscitation, intubation, re-hospitalizations and artificial feeding/hydration modalities. Ms. Kaner indicated that she would like to be managed at home with hospice support. She informed us that her mother will be her primary caregiver for her and her children, there are also friends and the father of her 2 children to offer intermittent support. We verified through the  interpreter that the mother clearly understood the content of the above conversation.   Assessment and Plan: 1. Code Status:DNR/DNI 2. Symptom Control:     -Pain: patient stated pain is located in abdominal area and generally worsens with increasing ascites. Currently she is on Fentanyl patch 25 mcg q 72 hrs, with Dilaudid 1 mg IV q hour as needed (3 mg/24 hrs), current pain score is 3/10 which was tolerable for patient.      -Nausea/Vomiting: patient has been able to eat and drink small amounts and have bowel movements, but continue to have intermittent nausea/vomiting. Currently she receives IV Zofran, IV Phenergan and IV Ativan for control.      Ascites: paracentesis completed on 06/03/11 at which time 2.2 liters, but unfortunately the fluid has re-accumulated. Dr Sharl Ma spoke with Dr Darnelle Catalan who will arrange for  peritoneal pleurex for drainage  3. Psycho/Social:patient is a single mother of 3 small girls, 9 years and younger. The process has begun to have the children enrolled in [facility] 4. Spiritual: spiritual consult placed 5. Disposition:Case management consult placed for hospice choice for home support.  Patient Documents Completed or Given: Document Given Completed  Advanced Directives Pkt    MOST    DNR       YES  Gone from My Sight    Hard Choices      Brief HPI:    ROS: Pain:  +, controlled with current medication  regime Insomnia: Denies Nausea/Vomiting: +moderately controlled with current med regime Dyspnea: Denies Constipation/Diarrhea: Denies Depression: Denies Appetite: eating/drinking small amts Fatigue: tired, after medication intervention for pain/nausea  PMH:  Past Medical History  Diagnosis Date  . Hypertension     SINCE FIRST PREGNANCY  . Asthma   . Constipation   . History of miscarriage   . Preeclampsia   . Postoperative ileus 07/2010  . Iron deficiency anemia     s/p transfusions [facility]  . Uterine cancer   .  Bowel obstruction      PSH: Past Surgical History  Procedure Date  . Cesarean section   . Abdominal hysterectomy 07-08-10     TAH-BSO, WITH OMENTECTOMY AND BILAT. PELVIC AND PERIAORTIC LYMPH NODE DISSECTION  . Appendectomy     IN CHILDHOOD  . Paracentesis 05/12/2011  . Porta cath placement    I have reviewed the FH and SH and  If appropriate update it with new information. No Known Allergies Scheduled Meds:   . alteplase  2 mg Intracatheter Once  . cloNIDine  0.1 mg Transdermal Weekly  . enoxaparin  40 mg Subcutaneous Q24H  . feeding supplement  237 mL Oral BID BM  . fentaNYL  25 mcg Transdermal Q72H  . indomethacin  25 mg Rectal TID  . lactulose  30 g Per Tube TID  . pantoprazole (PROTONIX) IV  40 mg Intravenous Q12H  . sodium chloride  10 mL Intravenous Q12H  . DISCONTD: insulin aspart  0-9 Units Subcutaneous TID  . DISCONTD: piperacillin-tazobactam (ZOSYN)  IV  3.375 g Intravenous Q8H  . DISCONTD: vancomycin  750 mg Intravenous Q12H   Continuous Infusions:   . sodium chloride 50 mL/hr at 06/05/11 0653  . fat emulsion 192 kcal (06/04/11 0500)  . TPN (CLINIMIX) +/- additives 50 mL/hr at 06/04/11 1802  . DISCONTD: fat emulsion    . DISCONTD: TPN (CLINIMIX) +/- additives     PRN Meds:.acetaminophen (TYLENOL) oral liquid 160 mg/5 mL, albuterol, benzocaine, benzonatate, chlorpheniramine-HYDROcodone, guaiFENesin-dextromethorphan, HYDROmorphone (DILAUDID) injection, lip balm, LORazepam, morphine, ondansetron (ZOFRAN) IV, promethazine, sodium chloride, zolpidem    BP 111/79  Pulse 101  Temp(Src) 98.6 F (37 C) (Oral)  Resp 18  Ht 5\' 6"  (1.676 m)  Wt 90.719 kg (200 lb)  BMI 32.28 kg/m2  SpO2 99%  LMP 07/02/2010   PPS:   Intake/Output Summary (Last 24 hours) at 06/05/11 1643 Last data filed at 06/05/11 1400  Gross per 24 hour  Intake   4304 ml  Output      0 ml  Net   4304 ml   LBM: 5/24  Physical Exam:  General: Tired looking young BF, alopecic HEENT:   Anicteric, mouth moist Chest: CTA bilaterally, diminished at bases  CVS: RRR, no MGR Abdomen:distended, tympanic, mild pain upon palpation, no guarding, PEG tube to intermitent low suction present and d/w yellow-tan fluid Ext: no edema Neuro:alert/oriented with appropriate verbal responses  Labs: CBC    Component Value Date/Time   WBC 7.9 06/05/2011 0715   WBC 4.1 05/25/2011 0830   RBC 2.67* 06/05/2011 0715   RBC 3.68* 05/25/2011 0830   HGB 7.0* 06/05/2011 0715   HGB 9.7* 05/25/2011 0830   HCT 23.0* 06/05/2011 0715   HCT 30.6* 05/25/2011 0830   PLT 199 06/05/2011 0715   PLT 427* 05/25/2011 0830   MCV 86.1 06/05/2011 0715   MCV 83.2 05/25/2011 0830   MCH 26.2 06/05/2011 0715   MCH 26.4 05/25/2011 0830   MCHC 30.4 06/05/2011 0715  MCHC 31.7 05/25/2011 0830   RDW 16.7* 06/05/2011 0715   RDW 16.2* 05/25/2011 0830   LYMPHSABS 0.5* 06/05/2011 0715   LYMPHSABS 0.5* 05/25/2011 0830   MONOABS 1.3* 06/05/2011 0715   MONOABS 0.0* 05/25/2011 0830   EOSABS 0.0 06/05/2011 0715   EOSABS 0.0 05/25/2011 0830   BASOSABS 0.0 06/05/2011 0715   BASOSABS 0.0 05/25/2011 0830    BMET    Component Value Date/Time   NA 137 06/04/2011 0500   K 4.1 06/04/2011 0500   CL 102 06/04/2011 0500   CO2 25 06/04/2011 0500   GLUCOSE 144* 06/04/2011 0500   BUN 28* 06/04/2011 0500   CREATININE 1.49* 06/04/2011 0500   CREATININE 0.55 10/17/2009 1300   CALCIUM 8.7 06/04/2011 0500   GFRNONAA 42* 06/04/2011 0500   GFRAA 49* 06/04/2011 0500    CMP     Component Value Date/Time   NA 137 06/04/2011 0500   K 4.1 06/04/2011 0500   CL 102 06/04/2011 0500   CO2 25 06/04/2011 0500   GLUCOSE 144* 06/04/2011 0500   BUN 28* 06/04/2011 0500   CREATININE 1.49* 06/04/2011 0500   CREATININE 0.55 10/17/2009 1300   CALCIUM 8.7 06/04/2011 0500   PROT 5.8* 06/04/2011 0500   ALBUMIN 1.7* 06/04/2011 0500   AST 11 06/04/2011 0500   ALT 10 06/04/2011 0500   ALKPHOS 105 06/04/2011 0500   BILITOT 0.2* 06/04/2011 0500   GFRNONAA 42* 06/04/2011 0500   GFRAA 49*  06/04/2011 0500    CT scan of the Head Reviewed/Impressions:06/02/11  IMPRESSION:  1. No evidence of acute pulmonary embolism.  2. Enlarging left pleural effusion with increasing left basilar  air space disease, likely atelectasis.  3. Upper abdominal ascites with possible peritoneal nodularity in  the left upper quadrant, concerning for peritoneal metastatic  disease. The patient underwent paracentesis 05/29/2011.  4. Stable mediastinal and left hilar adenopathy.   Time In Time Out Total Time Spent with Patient Total Overall Time  2:00p 4:00p  90 min  120 min    Greater than 50%  of this time was spent counseling and coordinating care related to the above assessment and plan.   Freddie Breech, CNS-C Palliative Medicine Team [facility] Team Phone: [phone] Pager: [phone]

## 2011-06-05 NOTE — Progress Notes (Signed)
While changing the tubing on the TPN the bag was punctured.  There was approximately 250 cc left in the bag of TPN.  Since this happened at 0700 and the patient is on cyclic she only lost 50 cc.  Notified Laurena Slimmer from pharmacy, his suggestion was to make note of occurrence.

## 2011-06-05 NOTE — Care Management Note (Signed)
    Page 1 of 2   06/05/2011     3:44:15 PM   CARE MANAGEMENT NOTE 06/05/2011  Patient:  Krystal [name], Krystal [name]   Account Number:  [id number]  Date Initiated:  05/28/2011  Documentation initiated by:  Jiles Crocker  Subjective/Objective Assessment:   ADMITTED WITH FEVER, endometrial carcinoma on chemotherapy , SBO     Action/Plan:   PCP:  [name], MD; LIVES AT HOME WITH FAMILY; RECEIVING HHC WITH [facility]/ HOME TNA   Anticipated DC Date:  06/08/2011   Anticipated DC Plan:  HOME W HOSPICE CARE  In-house referral  Clinical Social Worker  Hospice / Palliative Care  Nutrition      DC Planning Services  CM consult      PAC Choice  HOSPICE   Choice offered to / List presented to:  C-1 Patient           HH agency  [facility] [place]   Status of service:  Completed, signed off Medicare Important Message given?  NA - LOS <3 / Initial given by admissions (If response is "NO", the following Medicare IM given date fields will be blank) Date Medicare IM given:   Date Additional Medicare IM given:    Discharge Disposition:  HOME W HOSPICE CARE  Per UR Regulation:  Reviewed for med. necessity/level of care/duration of stay  If discussed at Long Length of Stay Meetings, dates discussed:   06/03/2011    Comments:  06-05-11 Lorenda Ishihara RN  CM [id number] Palliative care meeting held, patient wants [facility], contacted Margic Konrad Dolores to make her aware of possible d/c over weekend. Unsure of DME needs at this time. Patient active with [facility] prior to this admission.  05/28/2011- B CHANDLER RN, BSN, MHA

## 2011-06-05 NOTE — Progress Notes (Signed)
PT Cancellation Note  Treatment cancelled today due to per RN, pt is up ambulating on her own and does not need further PT.  Krystal, [name] 06/05/2011, 1:47 PM

## 2011-06-05 NOTE — Progress Notes (Signed)
PROGRESS NOTE  Krystal [name] XLK:[id number] DOB: 22-Apr-1970 DOA: 05/27/2011 PCP: [name], MD, MD  Brief narrative: Krystal [name] is a 41 year old female with endometrial carcinoma status post 2 cycles of chemotherapy, last given on 05/20/2011, whose treatment course has been complicated by a small bowel obstruction. During her previous hospital stay, a G-tube was placed for gastric drainage. She was admitted to the hospital on 05/27/2011 with fever in the setting of neutropenia.  Assessment/Plan: Principal Problem:  *Neutropenic fever in the setting of treatment for endometrial cancer, status post chemotherapy  Secondary to immunosuppressed status from malignancy and chemotherapy treatment.  Started on empiric antibiotics with vancomycin and zosyn upon admission, which does provide broad spectrum coverage. She is now on day #9 of empiric therapy so we'll discontinue antibiotics and monitor for recurrence of significant fever.  Question whether "tumor fever" may be playing a role.  Blood cultures negative to date, urine culture with insignificant growth. Although abdomen mildly tender, no rebound or guarding to suggest peritonitis.  Neupogen 300 mg x 1 dose given stat on admission according to Krystal [name] notes. Not currently on this therapy.  Hospice consultation requested by Krystal [name] 06/04/2011 with goals of care meeting scheduled for 06/05/2011.  Duragesic patch and 3 times a day Indocin suppositories ordered for pain control. Active Problems:  Partial small bowel obstruction with inadequate oral intake  Treated conservatively with bowel rest and G.-tube decompression of the stomach.   Evaluated by surgery during previous admission, not felt to be a surgical candidate.  TNA per pharmacy for nutritional support.  This is a temporary measure and should probably be stopped soon.  Chronic constipation  Continue bowel regimen as tolerated.  Ascites  Recurrent malignant ascites  noted. Patient underwent successful ultrasound-guided paracentesis yielding 2.2 L of amber fluid on 06/03/2011.  HTN (hypertension)  Blood pressure controlled on a clonidine patch.  Thrombocytopenia  Platelet count stable with no signs of bleeding. Likely from the sequela of prior chemotherapy.  ARF (acute renal failure) with history of Hydronephrosis  Creatinine slowly rising over time with admission creatinine of 1.06.  Anemia of chronic disease  Hemoglobin 7.0-8.4. Likely a combination of anemia of chronic disease as well as the sequela of prior chemotherapy.  No current complaints of dyspnea, chest pain, or dizziness. Would only transfuse for symptomatic anemia or hemoglobin less than 7 mg/dL.  Hypokalemia  Electrolytes being monitored closely with replacement as needed.  Code Status: DNR Family Communication: None at bedside. Disposition Plan: Home, when stable.  Medical Consultants:  Dr. Jama [name], Oncology  Palliative Care  Other consultants:  Pharmacy for TNA and antibiotic dosing.  Antibiotics: Zosyn 05/27/2011--->06/05/11 Vancomycin 05/27/11--->06/05/11   Subjective  Krystal [name] states that her pain is controlled. No nausea or vomiting. She 1 small loose bowel movement this morning.   Objective    Interim History: Stable overnight. For palliative care meeting with goals of care to be discussed by the patient with her family at 2 PM today.   Objective: Filed Vitals:   06/04/11 2054 06/04/11 2100 06/05/11 0500 06/05/11 0625  BP:  121/86  102/74  Pulse: 107 106  101  Temp:  99 F (37.2 C)  99 F (37.2 C)  TempSrc:  Oral  Oral  Resp: 18 26  24   Height:      Weight:   90.719 kg (200 lb)   SpO2: 98% 99%  99%    Intake/Output Summary (Last 24 hours) at 06/05/11 3664 Last data filed at  06/05/11 0646  Gross per 24 hour  Intake   3990 ml  Output      0 ml  Net   3990 ml    Exam: Gen:  NAD Cardiovascular:  RRR, No M/R/G Respiratory: Lungs  CTAB Gastrointestinal: Abdomen softly disteended, no rebound or guarding.  PEG tube present. Extremities: No C/E/C    Data Reviewed: Basic Metabolic Panel:  Lab 06/04/11 1478 06/03/11 0654 06/01/11 0455 05/31/11 1223 05/30/11 0515  NA 137 138 138 137 137  K 4.1 4.0 -- -- --  CL 102 105 104 102 102  CO2 25 24 24 24 26   GLUCOSE 144* 137* 117* 97 130*  BUN 28* 28* 29* 28* 24*  CREATININE 1.49* 1.47* 1.55* 1.54* 1.61*  CALCIUM 8.7 8.2* 8.5 8.4 8.6  MG 1.8 -- 1.7 -- --  PHOS 4.3 -- 3.0 -- --   GFR Estimated Creatinine Clearance: 55.3 ml/min (by C-G formula based on Cr of 1.49). Liver Function Tests:  Lab 06/04/11 0500 06/01/11 0455  AST 11 13  ALT 10 10  ALKPHOS 105 91  BILITOT 0.2* 0.3  PROT 5.8* 6.0  ALBUMIN 1.7* 1.8*    CBC:  Lab 06/05/11 0715 06/04/11 0500 06/03/11 0654 06/02/11 0510 06/01/11 0455  WBC 7.9 8.6 10.4 7.2 2.8*  NEUTROABS 6.1 6.8 7.9* 5.6 1.7  HGB 7.0* 7.6* 8.3* 8.4* 7.6*  HCT 23.0* 24.1* 26.4* 26.8* 24.6*  MCV 86.1 85.8 85.7 85.6 84.8  PLT 199 143* 138* 99* 93*   CBG:  Lab 06/05/11 0614 06/05/11 0025 06/04/11 1740 06/04/11 0636 06/04/11 0012  GLUCAP 142* 133* 85 152* 151*   Lipid Profile No results found for this basename: CHOL:2,HDL:2,LDLCALC:2,TRIG:2,CHOLHDL:2,LDLDIRECT:2 in the last 72 hours Microbiology Recent Results (from the past 240 hour(s))  URINE CULTURE     Status: Normal   Collection Time   05/27/11  7:00 AM      Component Value Range Status Comment   Specimen Description URINE, CLEAN CATCH   Final    Special Requests NONE   Final    Culture  Setup Time [id number]   Final    Colony Count 8,000 COLONIES/ML   Final    Culture INSIGNIFICANT GROWTH   Final    Report Status 05/28/2011 FINAL   Final   CULTURE, BLOOD (ROUTINE X 2)     Status: Normal   Collection Time   05/27/11  7:15 AM      Component Value Range Status Comment   Specimen Description BLOOD LEFT ANTECUBITAL   Final    Special Requests BOTTLES DRAWN AEROBIC AND  ANAEROBIC   Final    Culture  Setup Time [id number]   Final    Culture NO GROWTH 5 DAYS   Final    Report Status 06/02/2011 FINAL   Final   CULTURE, BLOOD (ROUTINE X 2)     Status: Normal   Collection Time   05/27/11  9:25 AM      Component Value Range Status Comment   Specimen Description BLOOD RIGHT PORT   Final    Special Requests BOTTLES DRAWN AEROBIC AND ANAEROBIC [facility]   Final    Culture  Setup Time [id number]   Final    Culture NO GROWTH 5 DAYS   Final    Report Status 06/02/2011 FINAL   Final   CULTURE, BLOOD (ROUTINE X 2)     Status: Normal   Collection Time   05/29/11  7:00 AM      Component Value Range  Status Comment   Specimen Description BLOOD LEFT HAND   Final    Special Requests BOTTLES DRAWN AEROBIC AND ANAEROBIC 5CC   Final    Culture  Setup Time [id number]   Final    Culture NO GROWTH 5 DAYS   Final    Report Status 06/04/2011 FINAL   Final   CULTURE, BLOOD (ROUTINE X 2)     Status: Normal   Collection Time   05/29/11  7:10 AM      Component Value Range Status Comment   Specimen Description BLOOD LEFT HAND   Final    Special Requests BOTTLES DRAWN AEROBIC AND ANAEROBIC [facility]   Final    Culture  Setup Time [id number]   Final    Culture NO GROWTH 5 DAYS   Final    Report Status 06/04/2011 FINAL   Final     Procedures and Diagnostic Studies:   US Paracentesis 06/03/2011 IMPRESSION: Successful ultrasound guided paracentesis yielding 2.2 liters of ascites.  Read by Brayton El PA-C  Original Report Authenticated By: Osa Craver, M.D.    Dg Abd Acute W/chest 06/03/2011 IMPRESSION: Retrocardiac collapse / consolidation.  No evidence for bowel perforation or obstruction.  Original Report Authenticated By: ERIC A. MANSELL, M.D.    Ct Angio Chest W/cm &/or Wo Cm 06/02/2011 IMPRESSION:  1.  No evidence of acute pulmonary embolism. 2.  Enlarging left pleural effusion with increasing left basilar air space disease, likely atelectasis. 3.  Upper abdominal  ascites with possible peritoneal nodularity in the left upper quadrant, concerning for peritoneal metastatic disease.  The patient underwent paracentesis 05/29/2011. 4.  Stable mediastinal and left hilar adenopathy.  Original Report Authenticated By: Gerrianne Scale, M.D.    Dg Chest 2 View 06/01/2011  IMPRESSION:  Bibasilar subsegmental atelectasis but no focal infiltrate or effusion.  Original Report Authenticated By: P. Loralie Champagne, M.D.    Dg Chest 2 View 05/31/2011 IMPRESSION: Mild bibasilar airspace disease is present which may represent atelectasis or pneumonia.  No significant interval change.  Original Report Authenticated By: Camelia Phenes, M.D.    Dg Chest Port 1v Same Day 05/29/2011 IMPRESSION: Increased retrocardiac opacity could reflect worsening basilar atelectasis or infection/pneumonia.  Original Report Authenticated By: Harley Hallmark, M.D.    Dg Chest 2 View 05/27/2011  IMPRESSION: Shallow inspiration with bilateral basilar atelectasis.  Cardiac enlargement.  No significant interval change.  Original Report Authenticated By: Marlon Pel, M.D.   Dg Chest Port 1v Same Day 05/29/2011 IMPRESSION: Increased retrocardiac opacity could reflect worsening basilar atelectasis or infection/pneumonia.  Original Report Authenticated By: Harley Hallmark, M.D.    Dg Abd Acute W/chest 05/26/2011  IMPRESSION: Resolving but not yet cleared small bowel obstruction.  Bibasilar atelectasis.  Original Report Authenticated By: Elsie Stain, M.D.    Scheduled Meds:    . alteplase  2 mg Intracatheter Once  . cloNIDine  0.1 mg Transdermal Weekly  . enoxaparin  40 mg Subcutaneous Q24H  . feeding supplement  237 mL Oral BID BM  . fentaNYL  25 mcg Transdermal Q72H  . indomethacin  25 mg Rectal TID  . insulin aspart  0-9 Units Subcutaneous TID  . lactulose  30 g Per Tube TID  . pantoprazole (PROTONIX) IV  40 mg Intravenous Q12H  . piperacillin-tazobactam (ZOSYN)  IV  3.375 g Intravenous  Q8H  . sodium chloride  10 mL Intravenous Q12H  . vancomycin  750 mg Intravenous Q12H  . DISCONTD: indomethacin  25 mg Rectal TID   Continuous Infusions:    . sodium chloride 50 mL/hr at 06/05/11 0653  . fat emulsion 192 kcal (06/04/11 0500)  . TPN [facility]) +/- additives 50 mL/hr at 06/04/11 1802      LOS: 9 days   Hillery Aldo, MD Pager [phone]  06/05/2011, 9:59 AM

## 2011-06-06 DIAGNOSIS — R1033 Periumbilical pain: Secondary | ICD-10-CM

## 2011-06-06 DIAGNOSIS — R509 Fever, unspecified: Secondary | ICD-10-CM

## 2011-06-06 DIAGNOSIS — R188 Other ascites: Secondary | ICD-10-CM

## 2011-06-06 DIAGNOSIS — N8502 Endometrial intraepithelial neoplasia [EIN]: Secondary | ICD-10-CM

## 2011-06-06 MED ORDER — INDOMETHACIN 50 MG RE SUPP: 25.0000 mg | Freq: Three times a day (TID) | RECTAL | Status: DC

## 2011-06-06 MED ORDER — HYDROMORPHONE HCL PF 1 MG/ML IJ SOLN
1.0000 mg | INTRAMUSCULAR | Status: DC | PRN
  Administered 2011-06-06: 1 mg via INTRAVENOUS
  Filled 2011-06-06: qty 1

## 2011-06-06 MED ORDER — FENTANYL 50 MCG/HR TD PT72: 1.0000 | MEDICATED_PATCH | TRANSDERMAL | Status: DC

## 2011-06-06 MED ORDER — HEPARIN SOD (PORK) LOCK FLUSH 100 UNIT/ML IV SOLN
INTRAVENOUS | Status: AC
  Filled 2011-06-06: qty 5

## 2011-06-06 MED ORDER — FENTANYL 50 MCG/HR TD PT72
50.0000 ug | MEDICATED_PATCH | TRANSDERMAL | Status: DC
  Administered 2011-06-06: 50 ug via TRANSDERMAL
  Filled 2011-06-06: qty 1

## 2011-06-06 MED ORDER — HYDROCOD POLST-CHLORPHEN POLST 10-8 MG/5ML PO LQCR: 5.0000 mL | Freq: Two times a day (BID) | ORAL | Status: DC | PRN

## 2011-06-06 MED ORDER — LORAZEPAM 1 MG PO TABS: 1.0000 mg | ORAL_TABLET | ORAL | Status: DC | PRN

## 2011-06-06 NOTE — Progress Notes (Signed)
While Chaplain was in hospital he followed up on Ms Krystal [name] before her discharge. We had a brief conversation concerning the availability of Hospice Chaplains in addition to the spiritual care of her faith community. Ms Krystal [name] is resigned to her diagnosis and is apprehensive about what the hospice experience will entail medically. Chaplain also met daughter Krystal [name], named for the [facility] Ms Muscatello has in God.  No Spiritual Care follow-up is foreseen because of pt discharge.  Sheila Ocasio D Elio Haden, APC, D.Min Chaplain 3:54 PM  06/06/2011

## 2011-06-06 NOTE — Progress Notes (Signed)
Patient discharged home with family.  Called to radiology for appt. For paracentesis and was told that they could not give me an appt. And they would call patient when scheduled.  Hospice nurse would be coming out to patient's house at 1630.  Discharge instructions given and patient verbalized understanding.

## 2011-06-06 NOTE — Progress Notes (Signed)
  06/06/2011, 8:10 AM  Hospital day: 11 Antibiotics: DCd Chemotherapy: day 18 cycle 2 adriamycin  Appreciate assistance from Palliative Care team and chaplain yesterday. I spoke with Dr.Lama before and after palliative consult.  Subjective Patient tells me that she is to be DC today (?). Antibiotics and TNA off. Is having abdominal pain and is nauseated now, tells me she is trying not to take meds so not drowsy when she goes home, but obviously arrangements not yet complete for DC so have encouraged her to use meds. PICC still in, but PAC should be sufficient for comfort care. Will increase duragesic now and expect she will need prn IV dilaudid by pump at DC. May need paracentesis before peritoneal drain can be placed/ before DC. Sister and uncle at bedside. Objective: Vital signs in last 24 hours: Blood pressure 90/60, pulse 100, temperature 99.5 F (37.5 C), temperature source Oral, resp. rate 18, height 5\' 6"  (1.676 m), weight 200 lb (90.719 kg), last menstrual period 07/02/2010, SpO2 100.00%.  Awake and alert, looks uncomfortable, nearly vomiting. Mouth moist. Not icteric. PAC site ok, IV NS at 50. PICC capped RUE. Lungs without wheezes or rales anteriorly. Cor tachy, regular. Abdomen almost tight, quiet. Small blood-tinged fluid in Gtube. LE no edema. Moves easily in bed. Intake/Output from previous day: 05/24 0701 - 05/25 0700 In: 314 [I.V.:212; TPN:102] Out: 600 [Drains:600] Intake/Output this shift:      Lab Results:  Basename 06/05/11 0715 06/04/11 0500  WBC 7.9 8.6  HGB 7.0* 7.6*  HCT 23.0* 24.1*  PLT 199 143*   BMET  Basename 06/04/11 0500  NA 137  K 4.1  CL 102  CO2 25  GLUCOSE 144*  BUN 28*  CREATININE 1.49*  CALCIUM 8.7    Studies/Results: No results found.   Assessment/Plan: 1. Rapidly progressive endometrial carcinoma despite initial surgery by gyn onc, 6 cycles taxol/carbo + RT thru mid Jan, 1 cycle of CDDP + Adria, and most recent Adriamycin as  above. Complications now include bowel obstruction, rapidly recurrent ascites, pain, nausea, shortness of breath,fever and anemia. Has G tube and PAC; likely PICC can come out when ready for DC. Expect needs IV dilaudid by pump at DC, may need paracentesis for comfort before able to get peritoneal catheter (with holiday weekend). DNR. Hospice to follow. Discussed with RN now. Hospice aware that I am glad to be attending for them after DC.  Django Nguyen P

## 2011-06-06 NOTE — Discharge Summary (Signed)
Physician Discharge Summary  Patient ID: Krystal [name] MRN: [id number] DOB/AGE: May 13, 1970 41 y.o.  Admit date: 05/27/2011 Discharge date: 06/06/2011  Primary Care Physician:  [name], MD, MD   Discharge Diagnoses:    .Neutropenic fever .End stage endometrial ca .Anemia of chronic disease .Malignant neoplasm of body of uterus .Partial small bowel obstruction .Chronic constipation .Ascites .HTN (hypertension) .Thrombocytopenia .Inadequate oral intake .ARF (acute renal failure) .Hydronephrosis .Syncope .Hypokalemia  Discharge Medications:  Medication List  As of 06/06/2011 10:29 AM   STOP taking these medications         tpn solution (CLINIMIX E 5/15) 5 % SOLN 2,000 mL with multivitamins adult INJ 10 mL, trace elements Cr-Cu-Mn-Se-Zn 10-998-500-60 MCG/ML SOLN 1 mL         TAKE these medications         chlorpheniramine-HYDROcodone 10-8 MG/5ML Lqcr   Commonly known as: TUSSIONEX   Take 5 mLs by mouth every 12 (twelve) hours as needed.      cloNIDine 0.2 mg/24hr patch   Commonly known as: CATAPRES - Dosed in mg/24 hr   Place 1 patch (0.2 mg total) onto the skin once a week.      fentaNYL 50 MCG/HR   Commonly known as: DURAGESIC - dosed mcg/hr   Place 1 patch (50 mcg total) onto the skin every 3 (three) days.      indomethacin 50 MG suppository   Commonly known as: INDOCIN   Place 0.5 suppositories (25 mg total) rectally 3 (three) times daily.      Lactulose Soln   30 mLs by Gastric Tube route 3 (three) times daily. Clamp tube for 60 minutes after putting lactulose in tube.      LORazepam 1 MG tablet   Commonly known as: ATIVAN   Take 1 tablet (1 mg total) by mouth every 4 (four) hours as needed for anxiety (nausea).      morphine 20 MG/5ML solution   Take 1.3-2.5 mLs (5.2-10 mg total) by mouth every 4 (four) hours as needed for pain.      ondansetron 8 MG disintegrating tablet   Commonly known as: ZOFRAN-ODT   Take 1 tablet (8 mg total) by mouth  every 8 (eight) hours as needed for nausea.      promethazine 25 MG suppository   Commonly known as: PHENERGAN   Place 1 suppository (25 mg total) rectally every 4 (four) hours as needed for nausea.             Disposition and Follow-up: The patient is being discharged home with hospice care. A hospital bed has been ordered and she will be followed by hospice and palliative care of [facility] She is instructed to followup with Dr. Darrold Span as needed.   Medical Consultants:  Dr. Jama Flavors, Oncology  Dr. Mauro Kaufmann, Palliative Care Other consultants:  Pharmacy for TNA and antibiotic dosing.  Procedures and Diagnostic Studies:  US Paracentesis 06/03/2011 IMPRESSION: Successful ultrasound guided paracentesis yielding 2.2 liters of ascites. Read by Brayton El PA-C Original Report Authenticated By: Osa Craver, M.D.  Dg Abd Acute W/chest 06/03/2011 IMPRESSION: Retrocardiac collapse / consolidation. No evidence for bowel perforation or obstruction. Original Report Authenticated By: ERIC A. MANSELL, M.D.  Ct Angio Chest W/cm &/or Wo Cm 06/02/2011 IMPRESSION: 1. No evidence of acute pulmonary embolism. 2. Enlarging left pleural effusion with increasing left basilar air space disease, likely atelectasis. 3. Upper abdominal ascites with possible peritoneal nodularity in the left upper quadrant, concerning for peritoneal metastatic  disease. The patient underwent paracentesis 05/29/2011. 4. Stable mediastinal and left hilar adenopathy. Original Report Authenticated By: Gerrianne Scale, M.D.  Dg Chest 2 View 06/01/2011 IMPRESSION: Bibasilar subsegmental atelectasis but no focal infiltrate or effusion. Original Report Authenticated By: P. Loralie Champagne, M.D.  Dg Chest 2 View 05/31/2011 IMPRESSION: Mild bibasilar airspace disease is present which may represent atelectasis or pneumonia. No significant interval change. Original Report Authenticated By: Camelia Phenes, M.D.  Dg Chest Port 1v  Same Day 05/29/2011 IMPRESSION: Increased retrocardiac opacity could reflect worsening basilar atelectasis or infection/pneumonia. Original Report Authenticated By: Harley Hallmark, M.D.  Dg Chest 2 View 05/27/2011 IMPRESSION: Shallow inspiration with bilateral basilar atelectasis. Cardiac enlargement. No significant interval change. Original Report Authenticated By: Marlon Pel, M.D. Dg Chest Port 1v Same Day 05/29/2011 IMPRESSION: Increased retrocardiac opacity could reflect worsening basilar atelectasis or infection/pneumonia. Original Report Authenticated By: Harley Hallmark, M.D.  Dg Abd Acute W/chest 05/26/2011 IMPRESSION: Resolving but not yet cleared small bowel obstruction. Bibasilar atelectasis. Original Report Authenticated By: Elsie Stain, M.D.    Discharge Laboratory Values: Basic Metabolic Panel:  Lab 06/04/11 1610 06/03/11 0654 06/01/11 0455 05/31/11 1223  NA 137 138 138 137  K 4.1 4.0 -- --  CL 102 105 104 102  CO2 25 24 24 24   GLUCOSE 144* 137* 117* 97  BUN 28* 28* 29* 28*  CREATININE 1.49* 1.47* 1.55* 1.54*  CALCIUM 8.7 8.2* 8.5 8.4  MG 1.8 -- 1.7 --  PHOS 4.3 -- 3.0 --   GFR Estimated Creatinine Clearance: 55.3 ml/min (by C-G formula based on Cr of 1.49). Liver Function Tests:  Lab 06/04/11 0500 06/01/11 0455  AST 11 13  ALT 10 10  ALKPHOS 105 91  BILITOT 0.2* 0.3  PROT 5.8* 6.0  ALBUMIN 1.7* 1.8*   CBC:  Lab 06/05/11 0715 06/04/11 0500 06/03/11 0654 06/02/11 0510 06/01/11 0455  WBC 7.9 8.6 10.4 7.2 2.8*  NEUTROABS 6.1 6.8 7.9* 5.6 1.7  HGB 7.0* 7.6* 8.3* 8.4* 7.6*  HCT 23.0* 24.1* 26.4* 26.8* 24.6*  MCV 86.1 85.8 85.7 85.6 84.8  PLT 199 143* 138* 99* 93*   CBG:  Lab 06/05/11 1831 06/05/11 0614 06/05/11 0025 06/04/11 1740 06/04/11 0636  GLUCAP 77 142* 133* 85 152*   Microbiology Recent Results (from the past 240 hour(s))  CULTURE, BLOOD (ROUTINE X 2)     Status: Normal   Collection Time   05/29/11  7:00 AM      Component Value Range Status  Comment   Specimen Description BLOOD LEFT HAND   Final    Special Requests BOTTLES DRAWN AEROBIC AND ANAEROBIC 5CC   Final    Culture  Setup Time [id number]   Final    Culture NO GROWTH 5 DAYS   Final    Report Status 06/04/2011 FINAL   Final   CULTURE, BLOOD (ROUTINE X 2)     Status: Normal   Collection Time   05/29/11  7:10 AM      Component Value Range Status Comment   Specimen Description BLOOD LEFT HAND   Final    Special Requests BOTTLES DRAWN AEROBIC AND ANAEROBIC [facility]   Final    Culture  Setup Time [id number]   Final    Culture NO GROWTH 5 DAYS   Final    Report Status 06/04/2011 FINAL   Final      Brief H and P: For complete details please refer to admission H and P, but in brief, Ms.  Standre is a 41 year old female with endometrial carcinoma status post 2 cycles of chemotherapy, last given on 05/20/2011, whose treatment course has been complicated by a small bowel obstruction. During her previous hospital stay, a G-tube was placed for gastric drainage. She was admitted to the hospital on 05/27/2011 with fever in the setting of neutropenia.   Physical Exam at Discharge: BP 90/60  Pulse 100  Temp(Src) 99.5 F (37.5 C) (Oral)  Resp 18  Ht 5\' 6"  (1.676 m)  Wt 90.719 kg (200 lb)  BMI 32.28 kg/m2  SpO2 100%  LMP 07/02/2010 Gen: NAD  Cardiovascular: RRR, No M/R/G  Respiratory: Lungs CTAB  Gastrointestinal: Abdomen softly distended, no rebound or guarding. PEG tube present.  Extremities: No C/E/C  Hospital Course:  Principal Problem:  *Neutropenic fever in the setting of treatment for endometrial cancer, status post chemotherapy  Secondary to immunosuppressed status from malignancy and chemotherapy treatment.  Treated with empiric antibiotics with vancomycin and zosyn for 9 days of therapy  "Tumor fever" thought to be driving ongoing problems with low-grade fevers.  Blood cultures negative to date, urine culture with insignificant growth. Although abdomen has been mildly  tender, she has not had any rebound or guarding to suggest peritonitis.  Neupogen 300 mg x 1 dose given stat on admission according to Dr. Armando Gang notes. Not currently on this therapy.  Hospice consultation requested by Dr. Darrold Span 06/04/2011 with goals of care meeting done with family on 06/05/2011.  Plan is to discharge home with hospice care. Active Problems:  Partial small bowel obstruction with inadequate oral intake  Treated conservatively with bowel rest and G.-tube decompression of the stomach.  Evaluated by surgery during previous admission, not felt to be a surgical candidate.  TNA discontinued after goals of care meeting. No plans to resume. The patient can continue full liquids and soft foods as tolerated with gastric decompression via the G-tube as needed for recurrent problems with nausea and vomiting. Chronic constipation  Continue bowel regimen as tolerated. Ascites  Recurrent malignant ascites noted. Patient underwent multiple ultrasound-guided paracenteses, with the last one yielding 2.2 L of amber fluid on 06/03/2011. Outpatient ultrasound paracentesis and placement of a tunneled catheter requested and will be scheduled. HTN (hypertension)  Blood pressure controlled on a clonidine patch. Thrombocytopenia  Platelet count stable with no signs of bleeding. Likely from the sequela of prior chemotherapy. ARF (acute renal failure) with history of Hydronephrosis  Creatinine slowly rising over time with admission creatinine of 1.06. No further lab draws. Anemia of chronic disease  Hemoglobin 7.0-8.4. Likely a combination of anemia of chronic disease as well as the sequela of prior chemotherapy.  No current complaints of dyspnea, chest pain, or dizziness. Would only transfuse for symptomatic anemia or hemoglobin less than 7 mg/dL. No further lab draws scheduled. Hypokalemia  Electrolytes being monitored closely with replacement as needed. No further lab draws scheduled.   Diet:   FL, soft foods as tolerated.  Activity:  Increase activity slowly.  Condition at Discharge:   Stable, terminal.  Time spent on Discharge:  40 minutes.  Signed: Dr. Trula Ore Christy Friede Pager [phone] 06/06/2011, 10:29 AM

## 2011-06-06 NOTE — Discharge Instructions (Signed)
Ascites  Ascites is a gathering of fluid in the belly (abdomen). This is most often caused by liver disease. It may also be caused by a number of other less common problems. It causes a ballooning out (distension) of the abdomen.  CAUSES   Scarring of the liver (cirrhosis) is the most common cause of ascites. Other causes include:   Infection or inflammation in the abdomen.   Cancer in the abdomen.   Heart failure.   Certain forms of kidney failure (nephritic syndrome).   Inflammation of the pancreas.   Clots in the veins of the liver.  SYMPTOMS   In the early stages of ascites, you may not have any symptoms. The main symptom of ascites is a sense of abdominal bloating. This is due to the presence of fluid. This may also cause an increase in abdominal or waist size. People with this condition can develop swelling in the legs, and men can develop a swollen scrotum. When there is a lot of fluid, it may be hard to breath. Stretching of the abdomen by fluid can be painful.  DIAGNOSIS   Certain features of your medical history, such as a history of liver disease and of an enlarging abdomen, can suggest the presence of ascites. The diagnosis of ascites can be made on physical exam by your caregiver. An abdominal ultrasound examination can confirm that ascites is present, and estimate the amount of fluid.  Once ascites is confirmed, it is important to determine its cause. Again, a history of one of the conditions listed in "CAUSES" provides a strong clue. A physical exam is important, and blood and X-ray tests may be needed. During a procedure called paracentesis, a sample of fluid is removed from the abdomen. This can determine certain key features about the fluid, such as whether or not infection or cancer is present. Your caregiver will determine if a paracentesis is necessary. They will describe the procedure to you.  PREVENTION   Ascites is a complication of other conditions. Therefore to prevent ascites, you  must seek treatment for any significant health conditions you have. Once ascites is present, careful attention to fluid and salt intake may help prevent it from getting worse. If you have ascites, you should not drink alcohol.  PROGNOSIS   The prognosis of ascites depends on the underlying disease. If the disease is reversible, such as with certain infections or with heart failure, then ascites may improve or disappear. When ascites is caused by cirrhosis, then it indicates that the liver disease has worsened, and further evaluation and treatment of the liver disease is needed. If your ascites is caused by cancer, then the success or failure of the cancer treatment will determine whether your ascites will improve or worsen.  RISKS AND COMPLICATIONS   Ascites is likely to worsen if it is not properly diagnosed and treated. A large amount of ascites can cause pain and difficulty breathing. The main complication, besides worsening, is infection (called spontaneous bacterial peritonitis). This requires prompt treatment.  TREATMENT   The treatment of ascites depends on its cause. When liver disease is your cause, medical management using water pills (diuretics) and decreasing salt intake is often effective. Ascites due to peritoneal inflammation or malignancy (cancer) alone does not respond to salt restriction and diuretics. Hospitalization is sometimes required.  If the treatment of ascites cannot be managed with medications, a number of other treatments are available. Your caregivers will help you decide which will work best for you. Some   of these are:   Removal of fluid from the abdomen (paracentesis).   Fluid from the abdomen is passed into a vein (peritoneovenous shunting).   Liver transplantation.   Transjugular intrahepatic portosystemic stent shunt.  HOME CARE INSTRUCTIONS   It is important to monitor body weight and the intake and output of fluids. Weigh yourself at the same time every day. Record your  weights. Fluid restriction may be necessary. It is also important to know your salt intake. The more salt you take in, the more fluid you will retain. Ninety percent of people with ascites respond to this approach.   Follow any directions for medicines carefully.   Follow up with your caregiver, as directed.   Report any changes in your health, especially any new or worsening symptoms.   If your ascites is from liver disease, avoid alcohol and other substances toxic to the liver.  SEEK MEDICAL CARE IF:    Your weight increases more than a few pounds in a few days.   Your abdominal or waist size increases.   You develop swelling in your legs.   You had swelling and it worsens.  SEEK IMMEDIATE MEDICAL CARE IF:    You develop a fever.   You develop new abdominal pain.   You develop difficulty breathing.   You develop confusion.   You have bleeding from the mouth, stomach, or rectum.  MAKE SURE YOU:    Understand these instructions.   Will watch your condition.   Will get help right away if you are not doing well or get worse.  Document Released: 12/29/2004 Document Revised: 12/18/2010 Document Reviewed: 07/30/2006  [employer] Patient Information 2012 [employer]

## 2011-06-06 NOTE — Progress Notes (Signed)
  Patient seen, feels better this morning. Denies nausea, vomiting overnight. Pain is well controlled at this time.  Filed Vitals:   06/06/11 0512  BP: 90/60  Pulse: 100  Temp: 99.5 F (37.5 C)  Resp: 18   Chest ; Clear b/l Heart S1S2 RRR Abdomen: Distended, mild tenderness on palpation Ext : No edema  A/P Nausea/Vomiting: Continue phenergan, zofran prn along with prn sublingual ativan.  Abdominal pain; Continue duragesic patch, and roxanol prn  Constipation; Continue lactulose at home to avoid constipation  Ascites: Recommend Peritoneal pleurex catheter as outpatient.

## 2011-06-07 ENCOUNTER — Other Ambulatory Visit: Payer: Self-pay | Admitting: Oncology

## 2011-06-07 DIAGNOSIS — C549 Malignant neoplasm of corpus uteri, unspecified: Secondary | ICD-10-CM

## 2011-06-07 NOTE — Progress Notes (Signed)
Cm contacted by MD Rama concerning hospital bed delivery. CM verified with [facility] rep [employer] scheduled delivery 1400 06/06/11. [facility] notified of pt's discharge. CM spoke to [employer] at [phone]. Hospice to begin upon dc. Pt's TNA discontinued. Hospice to provide care of pt's tube, suction, & PICC line.   Leonie Green [phone]

## 2011-06-09 ENCOUNTER — Other Ambulatory Visit: Payer: Self-pay | Admitting: Lab

## 2011-06-09 ENCOUNTER — Other Ambulatory Visit: Payer: Self-pay | Admitting: Pulmonary Disease

## 2011-06-09 ENCOUNTER — Ambulatory Visit: Payer: Self-pay | Admitting: Oncology

## 2011-06-09 ENCOUNTER — Other Ambulatory Visit: Payer: Self-pay

## 2011-06-09 DIAGNOSIS — C541 Malignant neoplasm of endometrium: Secondary | ICD-10-CM

## 2011-06-09 NOTE — ED Provider Notes (Signed)
Medical screening examination/treatment/procedure(s) were conducted as a shared visit with non-physician practitioner(s) and myself.  I personally evaluated the patient during the encounter   Krystal Nielsen, MD 06/09/11 2307

## 2011-06-10 ENCOUNTER — Other Ambulatory Visit (HOSPITAL_COMMUNITY): Payer: Self-pay

## 2011-06-10 ENCOUNTER — Telehealth: Payer: Self-pay

## 2011-06-10 ENCOUNTER — Other Ambulatory Visit: Payer: Self-pay | Admitting: Radiology

## 2011-06-10 NOTE — Telephone Encounter (Signed)
Krystal [name] is going to [place] today on a flight at 1500 so she will not be able to keep appt. For peritoneal catheter.   Cancelled appt. With IR. Discussion with patient and hospice nurse Gunnar Fusi [phone].  Krystal [name] wants to take this trip and will call Dr. Darrold Span 's nurse when she returns to reschedule catheter placement.

## 2011-06-11 ENCOUNTER — Ambulatory Visit: Payer: Self-pay

## 2011-06-12 ENCOUNTER — Telehealth: Payer: Self-pay | Admitting: Oncology

## 2011-06-12 NOTE — Telephone Encounter (Signed)
Called pt and left message regarding appt in June, July , August 2013 lab, chemo and MD

## 2011-06-15 ENCOUNTER — Inpatient Hospital Stay (HOSPITAL_COMMUNITY)
Admission: EM | Admit: 2011-06-15 | Discharge: 2011-06-19 | DRG: 755 | Disposition: A | Discharge status: Hospice | Payer: Medicaid Other | Attending: Internal Medicine | Admitting: Internal Medicine

## 2011-06-15 ENCOUNTER — Emergency Department (HOSPITAL_COMMUNITY): Payer: Medicaid Other

## 2011-06-15 ENCOUNTER — Encounter (HOSPITAL_COMMUNITY): Payer: Self-pay | Admitting: *Deleted

## 2011-06-15 DIAGNOSIS — D72829 Elevated white blood cell count, unspecified: Secondary | ICD-10-CM

## 2011-06-15 DIAGNOSIS — D6481 Anemia due to antineoplastic chemotherapy: Secondary | ICD-10-CM | POA: Diagnosis present

## 2011-06-15 DIAGNOSIS — Z66 Do not resuscitate: Secondary | ICD-10-CM | POA: Diagnosis present

## 2011-06-15 DIAGNOSIS — D649 Anemia, unspecified: Secondary | ICD-10-CM

## 2011-06-15 DIAGNOSIS — T451X5A Adverse effect of antineoplastic and immunosuppressive drugs, initial encounter: Secondary | ICD-10-CM | POA: Diagnosis present

## 2011-06-15 DIAGNOSIS — J45909 Unspecified asthma, uncomplicated: Secondary | ICD-10-CM | POA: Diagnosis present

## 2011-06-15 DIAGNOSIS — E46 Unspecified protein-calorie malnutrition: Secondary | ICD-10-CM | POA: Diagnosis present

## 2011-06-15 DIAGNOSIS — C541 Malignant neoplasm of endometrium: Secondary | ICD-10-CM

## 2011-06-15 DIAGNOSIS — B370 Candidal stomatitis: Secondary | ICD-10-CM | POA: Diagnosis present

## 2011-06-15 DIAGNOSIS — C801 Malignant (primary) neoplasm, unspecified: Secondary | ICD-10-CM | POA: Diagnosis present

## 2011-06-15 DIAGNOSIS — Z9221 Personal history of antineoplastic chemotherapy: Secondary | ICD-10-CM

## 2011-06-15 DIAGNOSIS — N179 Acute kidney failure, unspecified: Secondary | ICD-10-CM | POA: Diagnosis present

## 2011-06-15 DIAGNOSIS — K56609 Unspecified intestinal obstruction, unspecified as to partial versus complete obstruction: Secondary | ICD-10-CM | POA: Diagnosis present

## 2011-06-15 DIAGNOSIS — R180 Malignant ascites: Secondary | ICD-10-CM | POA: Diagnosis present

## 2011-06-15 DIAGNOSIS — N8502 Endometrial intraepithelial neoplasia [EIN]: Secondary | ICD-10-CM

## 2011-06-15 DIAGNOSIS — I10 Essential (primary) hypertension: Secondary | ICD-10-CM | POA: Diagnosis present

## 2011-06-15 DIAGNOSIS — Z515 Encounter for palliative care: Secondary | ICD-10-CM

## 2011-06-15 DIAGNOSIS — C549 Malignant neoplasm of corpus uteri, unspecified: Principal | ICD-10-CM | POA: Diagnosis present

## 2011-06-15 DIAGNOSIS — R188 Other ascites: Secondary | ICD-10-CM

## 2011-06-15 DIAGNOSIS — N170 Acute kidney failure with tubular necrosis: Secondary | ICD-10-CM

## 2011-06-15 DIAGNOSIS — C799 Secondary malignant neoplasm of unspecified site: Secondary | ICD-10-CM

## 2011-06-15 DIAGNOSIS — Z8542 Personal history of malignant neoplasm of other parts of uterus: Secondary | ICD-10-CM

## 2011-06-15 LAB — DIFFERENTIAL
Eosinophils Relative: 0 % (ref 0–5)
Lymphocytes Relative: 4 % — ABNORMAL LOW (ref 12–46)
Lymphs Abs: 0.6 10*3/uL — ABNORMAL LOW (ref 0.7–4.0)

## 2011-06-15 LAB — LIPASE, BLOOD: Lipase: 20 U/L (ref 11–59)

## 2011-06-15 LAB — COMPREHENSIVE METABOLIC PANEL
ALT: 40 U/L — ABNORMAL HIGH (ref 0–35)
Alkaline Phosphatase: 191 U/L — ABNORMAL HIGH (ref 39–117)
CO2: 35 mEq/L — ABNORMAL HIGH (ref 19–32)
Calcium: 9.4 mg/dL (ref 8.4–10.5)
GFR calc Af Amer: 12 mL/min — ABNORMAL LOW (ref >90)
GFR calc non Af Amer: 11 mL/min — ABNORMAL LOW (ref >90)
Glucose, Bld: 120 mg/dL — ABNORMAL HIGH (ref 70–99)
Sodium: 136 mEq/L (ref 135–145)

## 2011-06-15 LAB — CBC
HCT: 26.6 % — ABNORMAL LOW (ref 36.0–46.0)
Hemoglobin: 8.2 g/dL — ABNORMAL LOW (ref 12.0–15.0)
MCV: 86.6 fL (ref 78.0–100.0)
Platelets: 600 10*3/uL — ABNORMAL HIGH (ref 150–400)
RBC: 3.07 MIL/uL — ABNORMAL LOW (ref 3.87–5.11)
WBC: 14.3 10*3/uL — ABNORMAL HIGH (ref 4.0–10.5)

## 2011-06-15 MED ORDER — HYDROMORPHONE HCL PF 2 MG/ML IJ SOLN
2.0000 mg | Freq: Once | INTRAMUSCULAR | Status: AC
  Administered 2011-06-15: 2 mg via INTRAVENOUS
  Filled 2011-06-15: qty 1

## 2011-06-15 MED ORDER — ONDANSETRON HCL 4 MG/2ML IJ SOLN: 4.0000 mg | Freq: Four times a day (QID) | INTRAMUSCULAR | Status: DC | PRN

## 2011-06-15 MED ORDER — SODIUM CHLORIDE 0.9 % IV SOLN: Freq: Once | INTRAVENOUS | Status: DC

## 2011-06-15 MED ORDER — SODIUM CHLORIDE 0.9 % IJ SOLN
10.0000 mL | INTRAMUSCULAR | Status: DC | PRN
  Administered 2011-06-15 – 2011-06-17 (×2): 10 mL

## 2011-06-15 MED ORDER — CEFAZOLIN SODIUM 1-5 GM-% IV SOLN
1.0000 g | Freq: Once | INTRAVENOUS | Status: AC
  Administered 2011-06-16: 1 g via INTRAVENOUS
  Filled 2011-06-15: qty 50

## 2011-06-15 MED ORDER — SODIUM CHLORIDE 0.9 % IV SOLN: INTRAVENOUS | Status: AC

## 2011-06-15 MED ORDER — HYDROMORPHONE HCL PF 1 MG/ML IJ SOLN
1.0000 mg | Freq: Once | INTRAMUSCULAR | Status: AC
  Administered 2011-06-15: 1 mg via INTRAVENOUS

## 2011-06-15 MED ORDER — SODIUM CHLORIDE 0.9 % IV BOLUS (SEPSIS)
500.0000 mL | Freq: Once | INTRAVENOUS | Status: AC
  Administered 2011-06-15: 500 mL via INTRAVENOUS

## 2011-06-15 MED ORDER — HYDROMORPHONE HCL PF 1 MG/ML IJ SOLN: 2.0000 mg | INTRAMUSCULAR | Status: DC | PRN

## 2011-06-15 MED ORDER — LACTULOSE ENEMA
300.0000 mL | Freq: Every day | ORAL | Status: DC
  Administered 2011-06-18: 300 mL via RECTAL
  Filled 2011-06-15 (×4): qty 300

## 2011-06-15 MED ORDER — SODIUM CHLORIDE 0.9 % IV SOLN
INTRAVENOUS | Status: DC
  Administered 2011-06-16 – 2011-06-17 (×6): via INTRAVENOUS

## 2011-06-15 MED ORDER — METOCLOPRAMIDE HCL 5 MG/ML IJ SOLN
10.0000 mg | Freq: Once | INTRAMUSCULAR | Status: AC
  Administered 2011-06-15: 10 mg via INTRAVENOUS
  Filled 2011-06-15: qty 2

## 2011-06-15 MED ORDER — ONDANSETRON HCL 4 MG PO TABS
4.0000 mg | ORAL_TABLET | Freq: Four times a day (QID) | ORAL | Status: DC | PRN
  Filled 2011-06-15: qty 1

## 2011-06-15 MED ORDER — IOHEXOL 300 MG/ML  SOLN
20.0000 mL | INTRAMUSCULAR | Status: AC
  Administered 2011-06-15: 20 mL via ORAL

## 2011-06-15 MED ORDER — HYDROMORPHONE HCL PF 1 MG/ML IJ SOLN
1.0000 mg | INTRAMUSCULAR | Status: DC | PRN
  Administered 2011-06-16 (×2): 1 mg via INTRAVENOUS
  Filled 2011-06-15 (×2): qty 1

## 2011-06-15 MED ORDER — ONDANSETRON 4 MG PO TBDP
8.0000 mg | ORAL_TABLET | Freq: Once | ORAL | Status: AC
  Administered 2011-06-15: 8 mg via ORAL
  Filled 2011-06-15: qty 2

## 2011-06-15 MED ORDER — SODIUM CHLORIDE 0.9 % IJ SOLN
3.0000 mL | Freq: Two times a day (BID) | INTRAMUSCULAR | Status: DC
  Administered 2011-06-16 – 2011-06-18 (×4): 3 mL via INTRAVENOUS

## 2011-06-15 MED ORDER — ONDANSETRON HCL 4 MG/2ML IJ SOLN
4.0000 mg | Freq: Once | INTRAMUSCULAR | Status: AC
  Administered 2011-06-15: 4 mg via INTRAVENOUS
  Filled 2011-06-15: qty 2

## 2011-06-15 MED ORDER — ONDANSETRON HCL 4 MG/2ML IJ SOLN
4.0000 mg | Freq: Four times a day (QID) | INTRAMUSCULAR | Status: DC | PRN
  Administered 2011-06-16 – 2011-06-18 (×4): 4 mg via INTRAVENOUS
  Filled 2011-06-15 (×5): qty 2

## 2011-06-15 NOTE — ED Notes (Signed)
The iv team has accessed her porta-cath and drew her blood

## 2011-06-15 NOTE — ED Notes (Signed)
Patient denies nausea from drinking her contrast.  Family at bedside.

## 2011-06-15 NOTE — ED Provider Notes (Signed)
History     CSN: [id number]  Arrival date & time 06/15/11  1443   First MD Initiated Contact with Patient 06/15/11 1635      Chief Complaint  Patient presents with  . Abdominal Pain    (Consider location/radiation/quality/duration/timing/severity/associated sxs/prior treatment) The history is provided by the patient.  41 y/o F with PMH end-stage endometrial cancer, no longer undergoing chemotherapy, SBO, g-tube placement, and recent d/c from hospital on 5/25 after a 10-day stay for neutropenic fever, who presents to ED with c/c of worsening abdominal pain assoc with nausea for the last few days. Pain generalized but worse to the upper abd. Assoc with nausea, bilious emesis began on the way to ED; pt feels this is because she is not hooked up to her G-tube suction. + abd distention. Weakness onset this morning, assoc with intermittent visual hallucinations, "as if someone else is standing in front of me". Has had no head injury and there is no other change in visual acuity per pt. There has been no associated fever, chills, CP, SOB. No diarrhea. Symptoms worse with movement, palpation of area. No alleviating factors. Has taken home medications without relief.    Past Medical History  Diagnosis Date  . Hypertension     SINCE FIRST PREGNANCY  . Asthma   . Constipation   . History of miscarriage   . Preeclampsia   . Postoperative ileus 07/2010  . Iron deficiency anemia     s/p transfusions [facility]  . Uterine cancer   . Bowel obstruction     Past Surgical History  Procedure Date  . Cesarean section   . Abdominal hysterectomy 07-08-10     TAH-BSO, WITH OMENTECTOMY AND BILAT. PELVIC AND PERIAORTIC LYMPH NODE DISSECTION  . Appendectomy     IN CHILDHOOD  . Paracentesis 05/12/2011  . Porta cath placement     Family History  Problem Relation Age of Onset  . Diabetes Mother   . Hypertension Mother   . Hypertension Father     History  Substance Use Topics  . Smoking status: Never  Smoker   . Smokeless tobacco: Never Used  . Alcohol Use: Yes     occ.    OB History    Grav Para Term Preterm Abortions TAB SAB Ect Mult Living   4 3 2 1 1  0 1   3      Review of Systems  Genitourinary: Positive for decreased urine volume. Negative for dysuria.       + foul urinary odor  10 systems reviewed and are otherwise negative for acute change except as noted in the HPI.   Allergies  Morphine and related  Home Medications   Current Outpatient Rx  Name Route Sig Dispense Refill  . LACTULOSE 10 GM/15ML PO SOLN Gastric Tube 20 g by Gastric Tube route 3 (three) times daily. Clamp tube for 60 minutes after putting Lactulose in tube.    Marland Kitchen ONDANSETRON 8 MG PO TBDP Oral Take 8 mg by mouth every 8 (eight) hours as needed. For nausea.      BP 106/80  Pulse 115  Temp(Src) 98 F (36.7 C) (Oral)  Resp 14  SpO2 99%  LMP 07/02/2010  Physical Exam  Nursing note reviewed. Constitutional: She is oriented to person, place, and time. She appears well-developed and well-nourished.       VS reviewed, sig for tachycardia. Uncomfortable appearing.   HENT:  Head: Normocephalic and atraumatic.       MMM  Eyes:  Pupils are equal, round, and reactive to light.       Palpebral conjunctiva pale. Subglossal mucosa icteric.  Neck: Neck supple.  Cardiovascular: Regular rhythm and normal heart sounds.  Tachycardia present.   Pulmonary/Chest: Effort normal and breath sounds normal. No respiratory distress. She has no wheezes. She exhibits no tenderness.  Abdominal: Bowel sounds are normal. She exhibits distension and ascites. There is tenderness in the right lower quadrant, suprapubic area and left lower quadrant. There is no rigidity and no guarding.  Musculoskeletal: She exhibits no edema.  Neurological: She is alert and oriented to person, place, and time.  Skin: Skin is warm and dry.    ED Course  Procedures (including critical care time)  Labs Reviewed  CBC - Abnormal; Notable  for the following:    WBC 14.3 (*)    RBC 3.07 (*)    Hemoglobin 8.2 (*)    HCT 26.6 (*)    RDW 17.1 (*)    Platelets 600 (*)    All other components within normal limits  DIFFERENTIAL - Abnormal; Notable for the following:    Neutrophils Relative 84 (*)    Neutro Abs 12.0 (*)    Lymphocytes Relative 4 (*)    Lymphs Abs 0.6 (*)    Monocytes Absolute 1.7 (*)    All other components within normal limits  COMPREHENSIVE METABOLIC PANEL - Abnormal; Notable for the following:    Chloride 85 (*)    CO2 35 (*)    Glucose, Bld 120 (*)    BUN 66 (*)    Creatinine, Ser 4.60 (*)    Albumin 2.5 (*)    AST 43 (*)    ALT 40 (*)    Alkaline Phosphatase 191 (*)    GFR calc non Af Amer 11 (*)    GFR calc Af Amer 12 (*)    All other components within normal limits  SAMPLE TO BLOOD BANK  LIPASE, BLOOD  LACTIC ACID, PLASMA  PROCALCITONIN  URINALYSIS, ROUTINE W REFLEX MICROSCOPIC   Ct Abdomen Pelvis Wo Contrast  06/15/2011  *RADIOLOGY REPORT*  Clinical Data: Abdominal pain  CT ABDOMEN AND PELVIS WITHOUT CONTRAST  Technique:  Multidetector CT imaging of the abdomen and pelvis was performed following the standard protocol without intravenous contrast.  Comparison: 05/18/2011  Findings: Imaging of the lung bases shows left base collapse / consolidation with small to moderate left pleural effusion.  There is been interval progression of metastatic disease throughout the abdomen and pelvis.  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  Fluid along the right aspect of the liver now measures 2.6 cm in thickness compared 1.6 cm previously.  There is more obvious mass effect on the underlying liver today.  Similar findings are noted around the spleen.  The stomach is collapsed by the adjacent to intraperitoneal disease.  High attenuation material the gallbladder lumen is compatible with vicarious excretion of previously administered contrast material.  Gastrostomy tube is evident within the distal stomach.   No abdominal aortic aneurysm.  Adrenal glands are unremarkable. The fullness of the left intrarenal collecting system and ureter has progressed in the interval.  No right hydronephrosis at this time.  Mesenteric and periceliac disease has progressed.  Small bowel loops in the left abdomen are distended up to 2.9 cm but contrast flows through these distended loops into more distal less distended bowel loops.  Imaging through the pelvis shows interval increase and intraperitoneal metastatic disease.  There are some very collapsed small bowel  loops in the right lower quadrant.  Colon is filled with air and stool.  Bone windows reveal no worrisome lytic or sclerotic osseous lesions.  IMPRESSION: Substantial interval progression of intraperitoneal metastatic disease with increasing mass effect on the liver, spleen, and stomach.  The intraperitoneal disease generates some distention of proximal small bowel, but this is similar to the previous study. No overt small bowel obstruction at this time.  Original Report Authenticated By: ERIC A. MANSELL, M.D.   Ct Head Wo Contrast  06/15/2011  *RADIOLOGY REPORT*  Clinical Data: Visual disturbance.  CT HEAD WITHOUT CONTRAST  Technique:  Contiguous axial images were obtained from the base of the skull through the vertex without contrast.  Comparison: 06/05/2005  Findings: There is no evidence for acute hemorrhage, hydrocephalus, mass lesion, or abnormal extra-axial fluid collection.  No definite CT evidence for acute infarction.  The visualized paranasal sinuses and mastoid air cells are clear.  IMPRESSION: Stable.  No acute findings.  Original Report Authenticated By: ERIC A. MANSELL, M.D.   Dg Abd Acute W/chest  06/15/2011  *RADIOLOGY REPORT*  Clinical Data: Abdominal pain.  ACUTE ABDOMEN SERIES (ABDOMEN 2 VIEW & CHEST 1 VIEW)  Comparison: 06/03/2011  Findings: Upright chest shows left base atelectasis with small left pleural effusion.  The left base is better aerated than  on the previous study.  Right PICC line has been removed in the interval. Right Port-A-Cath remains in place.  Right-sided decubitus film shows no evidence for intraperitoneal free air.  The supine abdominal film shows gas distributed along the length of a mildly distended colon.  There are some mildly distended small bowel loops in the left abdomen measuring up to 4 cm in diameter.  A gastrostomy tube is evident.  IMPRESSION: Interval improvement in left base collapse / consolidation with small left pleural effusion now visible.  No intraperitoneal free air.  Mild gaseous distention of colon with some mildly gas distended small bowel the left abdomen.  Imaging features could be related to gastroenteritis, ileus or evolving obstruction.  Original Report Authenticated By: ERIC A. MANSELL, M.D.     1. Metastatic cancer   2. Acute kidney injury   3. Ascites   4. Transaminitis   5. Leukocytosis       MDM  Pt with metastatic CA, end-stage, on palliative/hospice care. Worsened abd pain/distention over several days with exam/labs/CT scan c/w ascites recurrence. Sig worsened renal insufficiency as compared to several weeks ago. Leukocytosis with stable anemia. CT head neg for acute findings. Given acutely worsened condition, pt will be admitted to the hospital for symptom management, fluid resuscitation (as there is likely some dehydration contributing), and possible paracentesis if deemed necessary/warranted by care team. Pt and family understand plan and agree.     Note: After consultation with admitting MD, I spoke with pt about rescuscitation wishes. Prior records indicate pt is DNR, though pt denies any knowledge of this and would like all measures taken in the event of cardiopulmonary arrest or other life-threatening situation. This will likely need to be further discussed with the patient upon admission.        Shaaron Adler, PA-C 06/15/11 2231

## 2011-06-15 NOTE — ED Notes (Addendum)
CT contrast completed.  CT notified.  Will have to wait until 2030 until she can be scanned.

## 2011-06-15 NOTE — ED Notes (Signed)
Went to talk with patient and she stated that she vomited after this nurse was in assess patient

## 2011-06-15 NOTE — ED Notes (Signed)
Pts G tube emptied.  Hooked to suction on intermittent per patients norm at home.

## 2011-06-15 NOTE — ED Notes (Signed)
Pt is aware that we need to collect a urine specimen, but unable to give urine at this time. Pt is not willing to have in & out cath.

## 2011-06-15 NOTE — ED Notes (Signed)
Pt unable to get a urine at present

## 2011-06-15 NOTE — ED Notes (Signed)
Pt tolerating Po contrast well reports relief of nausea and pain  Will continue to monitor

## 2011-06-15 NOTE — ED Notes (Signed)
The pt has a porta-cath iv team called to access and draw blood

## 2011-06-15 NOTE — ED Notes (Signed)
Iv team coming 

## 2011-06-15 NOTE — ED Notes (Signed)
The pt hashad abd pain nv for several days.  She also feels weak.  She feels like this whenever her blood count is low

## 2011-06-15 NOTE — ED Notes (Signed)
Stephanie in Ct notified of pt nausea and need for CT next

## 2011-06-15 NOTE — ED Notes (Signed)
Pt G tube to low intermittent suction. Pt given PO fluids and food

## 2011-06-15 NOTE — ED Provider Notes (Addendum)
Medical screening examination/treatment/procedure(s) were conducted as a shared visit with non-physician practitioner(s) and myself.  I personally evaluated the patient during the encounter   Patient seen by me patient with the end-stage cancer however still once to be a full code there was some controversy about that previous notes that she was a DO NOT RESUSCITATE but patient clearly is a clear state of mind and states that she's full code tonight. We will give her admitted for the abdominal pain nausea and vomiting may require some removal of fluid from her abdomen and needs comfort measures. Hospitalist team will admit.  Shelda Jakes, MD 06/15/11 1610  Shelda Jakes, MD 06/16/11 [phone]

## 2011-06-15 NOTE — ED Notes (Signed)
Returned from CT.

## 2011-06-16 ENCOUNTER — Inpatient Hospital Stay (HOSPITAL_COMMUNITY): Payer: Medicaid Other

## 2011-06-16 ENCOUNTER — Telehealth (HOSPITAL_COMMUNITY): Payer: Self-pay | Admitting: Oncology

## 2011-06-16 ENCOUNTER — Encounter (HOSPITAL_COMMUNITY): Payer: Self-pay | Admitting: Radiology

## 2011-06-16 DIAGNOSIS — R180 Malignant ascites: Secondary | ICD-10-CM

## 2011-06-16 DIAGNOSIS — N170 Acute kidney failure with tubular necrosis: Secondary | ICD-10-CM

## 2011-06-16 DIAGNOSIS — N8502 Endometrial intraepithelial neoplasia [EIN]: Secondary | ICD-10-CM

## 2011-06-16 DIAGNOSIS — D649 Anemia, unspecified: Secondary | ICD-10-CM

## 2011-06-16 LAB — URINE MICROSCOPIC-ADD ON

## 2011-06-16 LAB — BASIC METABOLIC PANEL
Chloride: 88 mEq/L — ABNORMAL LOW (ref 96–112)
Creatinine, Ser: 4.26 mg/dL — ABNORMAL HIGH (ref 0.50–1.10)
GFR calc Af Amer: 14 mL/min — ABNORMAL LOW (ref >90)
GFR calc non Af Amer: 12 mL/min — ABNORMAL LOW (ref >90)
Potassium: 3.1 mEq/L — ABNORMAL LOW (ref 3.5–5.1)

## 2011-06-16 LAB — CBC
HCT: 23.9 % — ABNORMAL LOW (ref 36.0–46.0)
RDW: 17.1 % — ABNORMAL HIGH (ref 11.5–15.5)
WBC: 14.2 10*3/uL — ABNORMAL HIGH (ref 4.0–10.5)

## 2011-06-16 LAB — HEPATIC FUNCTION PANEL
ALT: 31 U/L (ref 0–35)
Bilirubin, Direct: 0.2 mg/dL (ref 0.0–0.3)
Indirect Bilirubin: 0.2 mg/dL — ABNORMAL LOW (ref 0.3–0.9)
Total Protein: 7.4 g/dL (ref 6.0–8.3)

## 2011-06-16 LAB — APTT: aPTT: 32 seconds (ref 24–37)

## 2011-06-16 LAB — URINALYSIS, ROUTINE W REFLEX MICROSCOPIC
Glucose, UA: NEGATIVE mg/dL
Hgb urine dipstick: NEGATIVE
Ketones, ur: NEGATIVE mg/dL
Leukocytes, UA: NEGATIVE
Nitrite: NEGATIVE
Protein, ur: 30 mg/dL — AB
Specific Gravity, Urine: 1.018 (ref 1.005–1.030)
Urobilinogen, UA: 0.2 mg/dL (ref 0.0–1.0)
pH: 5 (ref 5.0–8.0)

## 2011-06-16 LAB — PROTIME-INR
INR: 1.22 (ref 0.00–1.49)
Prothrombin Time: 15.7 seconds — ABNORMAL HIGH (ref 11.6–15.2)

## 2011-06-16 LAB — TSH: TSH: 2.16 u[IU]/mL (ref 0.350–4.500)

## 2011-06-16 MED ORDER — FENTANYL CITRATE 0.05 MG/ML IJ SOLN
INTRAMUSCULAR | Status: AC | PRN
  Administered 2011-06-16: 25 ug via INTRAVENOUS
  Administered 2011-06-16: 50 ug via INTRAVENOUS
  Administered 2011-06-16: 25 ug via INTRAVENOUS

## 2011-06-16 MED ORDER — POTASSIUM CHLORIDE 10 MEQ/100ML IV SOLN
10.0000 meq | INTRAVENOUS | Status: AC
  Administered 2011-06-16 (×3): 10 meq via INTRAVENOUS
  Filled 2011-06-16 (×3): qty 100

## 2011-06-16 MED ORDER — PROMETHAZINE HCL 25 MG/ML IJ SOLN
12.5000 mg | Freq: Four times a day (QID) | INTRAMUSCULAR | Status: DC
  Administered 2011-06-16 – 2011-06-18 (×9): 12.5 mg via INTRAVENOUS
  Administered 2011-06-18: 11:00:00 via INTRAVENOUS
  Administered 2011-06-19 (×2): 12.5 mg via INTRAVENOUS
  Filled 2011-06-16 (×23): qty 1

## 2011-06-16 MED ORDER — HYDROMORPHONE HCL PF 1 MG/ML IJ SOLN
1.0000 mg | INTRAMUSCULAR | Status: DC | PRN
  Administered 2011-06-16 (×2): 2 mg via INTRAVENOUS
  Administered 2011-06-16: 1 mg via INTRAVENOUS
  Administered 2011-06-17: 2 mg via INTRAVENOUS
  Filled 2011-06-16 (×2): qty 1
  Filled 2011-06-16 (×2): qty 2
  Filled 2011-06-16: qty 1

## 2011-06-16 MED ORDER — MIDAZOLAM HCL 5 MG/5ML IJ SOLN
INTRAMUSCULAR | Status: AC | PRN
  Administered 2011-06-16: 1 mg via INTRAVENOUS
  Administered 2011-06-16: 0.5 mg via INTRAVENOUS
  Administered 2011-06-16: 1 mg via INTRAVENOUS
  Administered 2011-06-16: 0.5 mg via INTRAVENOUS

## 2011-06-16 MED ORDER — CEFAZOLIN SODIUM 1-5 GM-% IV SOLN
INTRAVENOUS | Status: AC
  Filled 2011-06-16: qty 50

## 2011-06-16 MED FILL — Lactulose Solution 10 GM/15ML: ORAL | Qty: 45 | Status: AC

## 2011-06-16 NOTE — Procedures (Signed)
RLQ Aspira drain

## 2011-06-16 NOTE — Progress Notes (Signed)
Room 5121 - Krystal [name] [facility]  [facility] RN Visit  Related admission to [facility] diagnosis of uterine cancer.   Pt is DNR code.    Pt arousable & oriented, lying in bed, with complaints of pain and discomfort-just had peritoneal drain placed with 2100 cc fluid removed-pt just medicated.   Mother-French speaking- present.    Please call [facility] @ [phone] with any needs.  Thank you.  Joneen Boers, RN  [facility]  Hospice Liaison

## 2011-06-16 NOTE — Progress Notes (Signed)
Krystal [name] is back in the hospital secondary to acute renal failure and nausea with abdominal pain. Labs showed acute renal failure.  Ultrasound showed mild to moderate hydronephrosis of the left kidney. Patient has extensive metastases in the abdomen. She is presently on hospice service. Will hydrate her to see if there is any improvement in her kidney function. I doubt she would be a candidate for any urologic intervention. Will monitor her kidney function today. Hospice palliative care has been notified that the patient is admitted.

## 2011-06-16 NOTE — Interval H&P Note (Cosign Needed)
History and Physical Interval Note:  06/16/2011 11:19 AM  Krystal [name]  has been admitted with a known diagnosis or metastatic endometrial cancer and recurrent malignant ascites. She was previously scheduled for Percutaneous tunneled catheter with port but has not yet had this. An order for paracentesis was placed but we have decided that the tunneled port is still appropriate. The various methods of treatment have been discussed with the patient and family. After consideration of risks, benefits and other options for treatment, the patient has consented to image guided tunneled percutaneous peritoneal catheter with port as intervention .  The patients' history has been reviewed, patient examined, no change in status, stable for surgery.  I have reviewed the patients' chart and labs.  Questions were answered to the patient's satisfaction.   BP 132/86  Pulse 113  Temp(Src) 98.7 F (37.1 C) (Oral)  Resp 18  Ht 5\' 6"  (1.676 m)  Wt 175 lb 8 oz (79.606 kg)  BMI 28.33 kg/m2  SpO2 99%  LMP 07/02/2010 Lungs: CTA without w/r/r Heart: Regular Abdomen: distended but NT Results for orders placed during the hospital encounter of 06/15/11  CBC      Component Value Range   WBC 14.3 (*) 4.0 - 10.5 (K/uL)   RBC 3.07 (*) 3.87 - 5.11 (MIL/uL)   Hemoglobin 8.2 (*) 12.0 - 15.0 (g/dL)   HCT 16.1 (*) 09.6 - 46.0 (%)   MCV 86.6  78.0 - 100.0 (fL)   MCH 26.7  26.0 - 34.0 (pg)   MCHC 30.8  30.0 - 36.0 (g/dL)   RDW 04.5 (*) 40.9 - 15.5 (%)   Platelets 600 (*) 150 - 400 (K/uL)  DIFFERENTIAL      Component Value Range   Neutrophils Relative 84 (*) 43 - 77 (%)   Neutro Abs 12.0 (*) 1.7 - 7.7 (K/uL)   Lymphocytes Relative 4 (*) 12 - 46 (%)   Lymphs Abs 0.6 (*) 0.7 - 4.0 (K/uL)   Monocytes Relative 12  3 - 12 (%)   Monocytes Absolute 1.7 (*) 0.1 - 1.0 (K/uL)   Eosinophils Relative 0  0 - 5 (%)   Eosinophils Absolute 0.0  0.0 - 0.7 (K/uL)   Basophils Relative 0  0 - 1 (%)   Basophils Absolute 0.0  0.0 -  0.1 (K/uL)  COMPREHENSIVE METABOLIC PANEL      Component Value Range   Sodium 136  135 - 145 (mEq/L)   Potassium 3.5  3.5 - 5.1 (mEq/L)   Chloride 85 (*) 96 - 112 (mEq/L)   CO2 35 (*) 19 - 32 (mEq/L)   Glucose, Bld 120 (*) 70 - 99 (mg/dL)   BUN 66 (*) 6 - 23 (mg/dL)   Creatinine, Ser 8.11 (*) 0.50 - 1.10 (mg/dL)   Calcium 9.4  8.4 - 91.4 (mg/dL)   Total Protein 8.3  6.0 - 8.3 (g/dL)   Albumin 2.5 (*) 3.5 - 5.2 (g/dL)   AST 43 (*) 0 - 37 (U/L)   ALT 40 (*) 0 - 35 (U/L)   Alkaline Phosphatase 191 (*) 39 - 117 (U/L)   Total Bilirubin 0.5  0.3 - 1.2 (mg/dL)   GFR calc non Af Amer 11 (*) >90 (mL/min)   GFR calc Af Amer 12 (*) >90 (mL/min)  SAMPLE TO BLOOD BANK      Component Value Range   Blood Bank Specimen SAMPLE AVAILABLE FOR TESTING     Sample Expiration 06/16/2011    LIPASE, BLOOD  Component Value Range   Lipase 20  11 - 59 (U/L)  URINALYSIS, ROUTINE W REFLEX MICROSCOPIC      Component Value Range   Color, Urine YELLOW  YELLOW    APPearance CLOUDY (*) CLEAR    Specific Gravity, Urine 1.018  1.005 - 1.030    pH 5.0  5.0 - 8.0    Glucose, UA NEGATIVE  NEGATIVE (mg/dL)   Hgb urine dipstick NEGATIVE  NEGATIVE    Bilirubin Urine SMALL (*) NEGATIVE    Ketones, ur NEGATIVE  NEGATIVE (mg/dL)   Protein, ur 30 (*) NEGATIVE (mg/dL)   Urobilinogen, UA 0.2  0.0 - 1.0 (mg/dL)   Nitrite NEGATIVE  NEGATIVE    Leukocytes, UA NEGATIVE  NEGATIVE   LACTIC ACID, PLASMA      Component Value Range   Lactic Acid, Venous 1.0  0.5 - 2.2 (mmol/L)  PROCALCITONIN      Component Value Range   Procalcitonin 1.96    APTT      Component Value Range   aPTT 32  24 - 37 (seconds)  BASIC METABOLIC PANEL      Component Value Range   Sodium 139  135 - 145 (mEq/L)   Potassium 3.1 (*) 3.5 - 5.1 (mEq/L)   Chloride 88 (*) 96 - 112 (mEq/L)   CO2 36 (*) 19 - 32 (mEq/L)   Glucose, Bld 107 (*) 70 - 99 (mg/dL)   BUN 65 (*) 6 - 23 (mg/dL)   Creatinine, Ser 1.61 (*) 0.50 - 1.10 (mg/dL)   Calcium 8.9  8.4  - 10.5 (mg/dL)   GFR calc non Af Amer 12 (*) >90 (mL/min)   GFR calc Af Amer 14 (*) >90 (mL/min)  CBC      Component Value Range   WBC 14.2 (*) 4.0 - 10.5 (K/uL)   RBC 2.81 (*) 3.87 - 5.11 (MIL/uL)   Hemoglobin 7.6 (*) 12.0 - 15.0 (g/dL)   HCT 09.6 (*) 04.5 - 46.0 (%)   MCV 85.1  78.0 - 100.0 (fL)   MCH 27.0  26.0 - 34.0 (pg)   MCHC 31.8  30.0 - 36.0 (g/dL)   RDW 40.9 (*) 81.1 - 15.5 (%)   Platelets 547 (*) 150 - 400 (K/uL)  PROTIME-INR      Component Value Range   Prothrombin Time 15.7 (*) 11.6 - 15.2 (seconds)   INR 1.22  0.00 - 1.49   HEPATIC FUNCTION PANEL      Component Value Range   Total Protein 7.4  6.0 - 8.3 (g/dL)   Albumin 2.3 (*) 3.5 - 5.2 (g/dL)   AST 30  0 - 37 (U/L)   ALT 31  0 - 35 (U/L)   Alkaline Phosphatase 167 (*) 39 - 117 (U/L)   Total Bilirubin 0.4  0.3 - 1.2 (mg/dL)   Bilirubin, Direct 0.2  0.0 - 0.3 (mg/dL)   Indirect Bilirubin 0.2 (*) 0.3 - 0.9 (mg/dL)  URINE MICROSCOPIC-ADD ON      Component Value Range   Squamous Epithelial / LPF MANY (*) RARE    WBC, UA 3-6  <3 (WBC/hpf)   RBC / HPF 0-2  <3 (RBC/hpf)   Bacteria, UA FEW (*) RARE    Casts HYALINE CASTS (*) NEGATIVE    Urine-Other FEW YEAST      Brayton El PA-C Seen for Dr. Maryclare Bean

## 2011-06-16 NOTE — Telephone Encounter (Signed)
Notified [facility] that patient was admitted to hospitalist service at [facility].

## 2011-06-16 NOTE — H&P (Signed)
Krystal [name] is an 41 y.o. female.   Chief Complaint: Abdominal pain and distention HPI: A 41 year old female with disseminated metastatic endometrial cancer which was recently hospitalized with febrile neutropenia. Patient was discharged home a few days ago but returned today with abdominal distention and pain. During the last hospitalization she had paracentesis for malignant ascites. She felt better but it seems like the ascites is now back. She has pain and distention the pain rated as 6/10. Affecting her breathing and is making her uncomfortable. Patient is no longer on chemotherapy at this point and is currently a DO NOT RESUSCITATE. She is being followed by oncologist and has a G-tube that is usually hooked up to suction. Denied any fever or chills. No melena no bright red blood per rectum.  Past Medical History  Diagnosis Date  . Hypertension     SINCE FIRST PREGNANCY  . Asthma   . Constipation   . History of miscarriage   . Preeclampsia   . Postoperative ileus 07/2010  . Iron deficiency anemia     s/p transfusions [facility]  . Uterine cancer   . Bowel obstruction     Past Surgical History  Procedure Date  . Cesarean section   . Abdominal hysterectomy 07-08-10     TAH-BSO, WITH OMENTECTOMY AND BILAT. PELVIC AND PERIAORTIC LYMPH NODE DISSECTION  . Appendectomy     IN CHILDHOOD  . Paracentesis 05/12/2011  . Porta cath placement     Family History  Problem Relation Age of Onset  . Diabetes Mother   . Hypertension Mother   . Hypertension Father    Social History:  reports that she has never smoked. She has never used smokeless tobacco. She reports that she drinks alcohol. She reports that she does not use illicit drugs.  Allergies:  Allergies  Allergen Reactions  . Morphine And Related     vomiting    Medications Prior to Admission  Medication Sig Dispense Refill  . lactulose (CHRONULAC) 10 GM/15ML solution 20 g by Gastric Tube route 3 (three) times daily. Clamp tube for  60 minutes after putting Lactulose in tube.      . ondansetron (ZOFRAN-ODT) 8 MG disintegrating tablet Take 8 mg by mouth every 8 (eight) hours as needed. For nausea.        Results for orders placed during the hospital encounter of 06/15/11 (from the past 48 hour(s))  SAMPLE TO BLOOD BANK     Status: Normal   Collection Time   06/15/11  4:05 PM      Component Value Range Comment   Blood Bank Specimen SAMPLE AVAILABLE FOR TESTING      Sample Expiration 06/16/2011     CBC     Status: Abnormal   Collection Time   06/15/11  4:41 PM      Component Value Range Comment   WBC 14.3 (*) 4.0 - 10.5 (K/uL)    RBC 3.07 (*) 3.87 - 5.11 (MIL/uL)    Hemoglobin 8.2 (*) 12.0 - 15.0 (g/dL)    HCT 78.2 (*) 95.6 - 46.0 (%)    MCV 86.6  78.0 - 100.0 (fL)    MCH 26.7  26.0 - 34.0 (pg)    MCHC 30.8  30.0 - 36.0 (g/dL)    RDW 21.3 (*) 08.6 - 15.5 (%)    Platelets 600 (*) 150 - 400 (K/uL)   DIFFERENTIAL     Status: Abnormal   Collection Time   06/15/11  4:41 PM  Component Value Range Comment   Neutrophils Relative 84 (*) 43 - 77 (%)    Neutro Abs 12.0 (*) 1.7 - 7.7 (K/uL)    Lymphocytes Relative 4 (*) 12 - 46 (%)    Lymphs Abs 0.6 (*) 0.7 - 4.0 (K/uL)    Monocytes Relative 12  3 - 12 (%)    Monocytes Absolute 1.7 (*) 0.1 - 1.0 (K/uL)    Eosinophils Relative 0  0 - 5 (%)    Eosinophils Absolute 0.0  0.0 - 0.7 (K/uL)    Basophils Relative 0  0 - 1 (%)    Basophils Absolute 0.0  0.0 - 0.1 (K/uL)   COMPREHENSIVE METABOLIC PANEL     Status: Abnormal   Collection Time   06/15/11  4:41 PM      Component Value Range Comment   Sodium 136  135 - 145 (mEq/L)    Potassium 3.5  3.5 - 5.1 (mEq/L)    Chloride 85 (*) 96 - 112 (mEq/L)    CO2 35 (*) 19 - 32 (mEq/L)    Glucose, Bld 120 (*) 70 - 99 (mg/dL)    BUN 66 (*) 6 - 23 (mg/dL)    Creatinine, Ser 1.61 (*) 0.50 - 1.10 (mg/dL)    Calcium 9.4  8.4 - 10.5 (mg/dL)    Total Protein 8.3  6.0 - 8.3 (g/dL)    Albumin 2.5 (*) 3.5 - 5.2 (g/dL)    AST 43 (*) 0 - 37  (U/L)    ALT 40 (*) 0 - 35 (U/L)    Alkaline Phosphatase 191 (*) 39 - 117 (U/L)    Total Bilirubin 0.5  0.3 - 1.2 (mg/dL)    GFR calc non Af Amer 11 (*) >90 (mL/min)    GFR calc Af Amer 12 (*) >90 (mL/min)   LIPASE, BLOOD     Status: Normal   Collection Time   06/15/11  4:41 PM      Component Value Range Comment   Lipase 20  11 - 59 (U/L)   LACTIC ACID, PLASMA     Status: Normal   Collection Time   06/15/11  5:07 PM      Component Value Range Comment   Lactic Acid, Venous 1.0  0.5 - 2.2 (mmol/L)   PROCALCITONIN     Status: Normal   Collection Time   06/15/11  5:07 PM      Component Value Range Comment   Procalcitonin 1.96     APTT     Status: Normal   Collection Time   06/16/11  1:10 AM      Component Value Range Comment   aPTT 32  24 - 37 (seconds)   BASIC METABOLIC PANEL     Status: Abnormal   Collection Time   06/16/11  1:10 AM      Component Value Range Comment   Sodium 139  135 - 145 (mEq/L)    Potassium 3.1 (*) 3.5 - 5.1 (mEq/L)    Chloride 88 (*) 96 - 112 (mEq/L)    CO2 36 (*) 19 - 32 (mEq/L)    Glucose, Bld 107 (*) 70 - 99 (mg/dL)    BUN 65 (*) 6 - 23 (mg/dL)    Creatinine, Ser 0.96 (*) 0.50 - 1.10 (mg/dL)    Calcium 8.9  8.4 - 10.5 (mg/dL)    GFR calc non Af Amer 12 (*) >90 (mL/min)    GFR calc Af Amer 14 (*) >90 (mL/min)   CBC  Status: Abnormal   Collection Time   06/16/11  1:10 AM      Component Value Range Comment   WBC 14.2 (*) 4.0 - 10.5 (K/uL)    RBC 2.81 (*) 3.87 - 5.11 (MIL/uL)    Hemoglobin 7.6 (*) 12.0 - 15.0 (g/dL)    HCT 16.1 (*) 09.6 - 46.0 (%)    MCV 85.1  78.0 - 100.0 (fL)    MCH 27.0  26.0 - 34.0 (pg)    MCHC 31.8  30.0 - 36.0 (g/dL)    RDW 04.5 (*) 40.9 - 15.5 (%)    Platelets 547 (*) 150 - 400 (K/uL)   PROTIME-INR     Status: Abnormal   Collection Time   06/16/11  1:10 AM      Component Value Range Comment   Prothrombin Time 15.7 (*) 11.6 - 15.2 (seconds)    INR 1.22  0.00 - 1.49    HEPATIC FUNCTION PANEL     Status: Abnormal   Collection  Time   06/16/11  2:01 AM      Component Value Range Comment   Total Protein 7.4  6.0 - 8.3 (g/dL)    Albumin 2.3 (*) 3.5 - 5.2 (g/dL)    AST 30  0 - 37 (U/L)    ALT 31  0 - 35 (U/L)    Alkaline Phosphatase 167 (*) 39 - 117 (U/L)    Total Bilirubin 0.4  0.3 - 1.2 (mg/dL)    Bilirubin, Direct 0.2  0.0 - 0.3 (mg/dL)    Indirect Bilirubin 0.2 (*) 0.3 - 0.9 (mg/dL)    Ct Abdomen Pelvis Wo Contrast  06/15/2011  *RADIOLOGY REPORT*  Clinical Data: Abdominal pain  CT ABDOMEN AND PELVIS WITHOUT CONTRAST  Technique:  Multidetector CT imaging of the abdomen and pelvis was performed following the standard protocol without intravenous contrast.  Comparison: 05/18/2011  Findings: Imaging of the lung bases shows left base collapse / consolidation with small to moderate left pleural effusion.  There is been interval progression of metastatic disease throughout the abdomen and pelvis.  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  Fluid along the right aspect of the liver now measures 2.6 cm in thickness compared 1.6 cm previously.  There is more obvious mass effect on the underlying liver today.  Similar findings are noted around the spleen.  The stomach is collapsed by the adjacent to intraperitoneal disease.  High attenuation material the gallbladder lumen is compatible with vicarious excretion of previously administered contrast material.  Gastrostomy tube is evident within the distal stomach.  No abdominal aortic aneurysm.  Adrenal glands are unremarkable. The fullness of the left intrarenal collecting system and ureter has progressed in the interval.  No right hydronephrosis at this time.  Mesenteric and periceliac disease has progressed.  Small bowel loops in the left abdomen are distended up to 2.9 cm but contrast flows through these distended loops into more distal less distended bowel loops.  Imaging through the pelvis shows interval increase and intraperitoneal metastatic disease.  There are some very  collapsed small bowel loops in the right lower quadrant.  Colon is filled with air and stool.  Bone windows reveal no worrisome lytic or sclerotic osseous lesions.  IMPRESSION: Substantial interval progression of intraperitoneal metastatic disease with increasing mass effect on the liver, spleen, and stomach.  The intraperitoneal disease generates some distention of proximal small bowel, but this is similar to the previous study. No overt small bowel obstruction at this time.  Original  Report Authenticated By: ERIC A. MANSELL, M.D.   Ct Head Wo Contrast  06/15/2011  *RADIOLOGY REPORT*  Clinical Data: Visual disturbance.  CT HEAD WITHOUT CONTRAST  Technique:  Contiguous axial images were obtained from the base of the skull through the vertex without contrast.  Comparison: 06/05/2005  Findings: There is no evidence for acute hemorrhage, hydrocephalus, mass lesion, or abnormal extra-axial fluid collection.  No definite CT evidence for acute infarction.  The visualized paranasal sinuses and mastoid air cells are clear.  IMPRESSION: Stable.  No acute findings.  Original Report Authenticated By: ERIC A. MANSELL, M.D.   Dg Abd Acute W/chest  06/15/2011  *RADIOLOGY REPORT*  Clinical Data: Abdominal pain.  ACUTE ABDOMEN SERIES (ABDOMEN 2 VIEW & CHEST 1 VIEW)  Comparison: 06/03/2011  Findings: Upright chest shows left base atelectasis with small left pleural effusion.  The left base is better aerated than on the previous study.  Right PICC line has been removed in the interval. Right Port-A-Cath remains in place.  Right-sided decubitus film shows no evidence for intraperitoneal free air.  The supine abdominal film shows gas distributed along the length of a mildly distended colon.  There are some mildly distended small bowel loops in the left abdomen measuring up to 4 cm in diameter.  A gastrostomy tube is evident.  IMPRESSION: Interval improvement in left base collapse / consolidation with small left pleural effusion now  visible.  No intraperitoneal free air.  Mild gaseous distention of colon with some mildly gas distended small bowel the left abdomen.  Imaging features could be related to gastroenteritis, ileus or evolving obstruction.  Original Report Authenticated By: ERIC A. MANSELL, M.D.    Review of Systems  Constitutional: Positive for malaise/fatigue.  HENT: Negative.   Eyes: Negative.   Respiratory: Positive for cough and shortness of breath. Negative for hemoptysis and wheezing.   Cardiovascular: Negative.   Gastrointestinal: Positive for nausea, vomiting and abdominal pain. Negative for diarrhea, blood in stool and melena.  Genitourinary: Negative.   Musculoskeletal: Positive for myalgias.  Skin: Negative.   Neurological: Positive for weakness.  Endo/Heme/Allergies: Negative.   Psychiatric/Behavioral: Positive for depression. The patient is nervous/anxious.     Blood pressure 134/90, pulse 110, temperature 98.1 F (36.7 C), temperature source Axillary, resp. rate 18, weight 79.606 kg (175 lb 8 oz), last menstrual period 07/02/2010, SpO2 95.00%. Physical Exam  Constitutional: She appears well-developed.  HENT:  Head: Normocephalic and atraumatic.  Right Ear: External ear normal.  Left Ear: External ear normal.  Nose: Nose normal.  Mouth/Throat: Oropharynx is clear and moist.  Eyes: Conjunctivae and EOM are normal. Pupils are equal, round, and reactive to light.  Neck: Normal range of motion. Neck supple.  Cardiovascular: Normal rate, regular rhythm, normal heart sounds and intact distal pulses.   Respiratory: No respiratory distress. She has decreased breath sounds in the left upper field, the left middle field and the left lower field. She has no wheezes. She has no rales. She exhibits no tenderness.  GI: She exhibits distension. She exhibits no mass. There is tenderness. There is no rebound and no guarding.  Musculoskeletal: Normal range of motion.  Neurological: She is alert. She has  normal reflexes.  Skin: Skin is warm. She is diaphoretic.  Psychiatric: Her behavior is normal. Thought content normal.     Assessment/Plan A 41 year old female being admitted with abdominal pain anemia and malignant ascites. Patient also has developed acute renal failure in the last couple of days with creatinine now very  much elevated. The renal failure is most likely secondary to dehydration versus complications of her malignant tumor affecting her renal function. It could be obstruction from her intra-abdominal tumor. Plan #1 acute renal failure: Patient will be admitted we will hydrate her and check renal ultrasound. She is not a candidate for hemodialysis. We will follow the renal function if she does not improve we will have to discuss comfort measures. #2 malignant Ascites: Patient may benefit again for therapeutic paracentesis. We will check an ultrasound first and defer further treatment oncologist #3 anemia: Most likely from her recent chemotherapy. She has leukocytosis at this point and not neutropenia probably from the abdominal process. We'll follow H&H H. and if it drops further we'll transfuse red blood cells. #4 disseminated cancer: Again we'll defer treatment to oncology.   Fabyan Loughmiller,LAWAL 06/16/2011, 5:04 AM

## 2011-06-17 DIAGNOSIS — N8502 Endometrial intraepithelial neoplasia [EIN]: Secondary | ICD-10-CM

## 2011-06-17 DIAGNOSIS — D649 Anemia, unspecified: Secondary | ICD-10-CM

## 2011-06-17 DIAGNOSIS — C549 Malignant neoplasm of corpus uteri, unspecified: Principal | ICD-10-CM

## 2011-06-17 DIAGNOSIS — N170 Acute kidney failure with tubular necrosis: Secondary | ICD-10-CM

## 2011-06-17 DIAGNOSIS — R180 Malignant ascites: Secondary | ICD-10-CM

## 2011-06-17 DIAGNOSIS — K56609 Unspecified intestinal obstruction, unspecified as to partial versus complete obstruction: Secondary | ICD-10-CM

## 2011-06-17 LAB — BASIC METABOLIC PANEL
GFR calc Af Amer: 22 mL/min — ABNORMAL LOW (ref >90)
GFR calc non Af Amer: 19 mL/min — ABNORMAL LOW (ref >90)
Glucose, Bld: 84 mg/dL (ref 70–99)
Potassium: 3.3 mEq/L — ABNORMAL LOW (ref 3.5–5.1)
Sodium: 141 mEq/L (ref 135–145)

## 2011-06-17 MED ORDER — DIPHENHYDRAMINE HCL 12.5 MG/5ML PO ELIX
12.5000 mg | ORAL_SOLUTION | Freq: Four times a day (QID) | ORAL | Status: DC | PRN
  Filled 2011-06-17: qty 5

## 2011-06-17 MED ORDER — HYDROMORPHONE 0.3 MG/ML IV SOLN
INTRAVENOUS | Status: DC
  Administered 2011-06-17: 11:00:00 via INTRAVENOUS
  Filled 2011-06-17: qty 25

## 2011-06-17 MED ORDER — HYDROMORPHONE HCL PF 1 MG/ML IJ SOLN
INTRAMUSCULAR | Status: AC
  Administered 2011-06-17: 2 mg via INTRAVENOUS
  Filled 2011-06-17: qty 2

## 2011-06-17 MED ORDER — FENTANYL 50 MCG/HR TD PT72: 50.0000 ug | MEDICATED_PATCH | TRANSDERMAL | Status: DC

## 2011-06-17 MED ORDER — SODIUM CHLORIDE 0.9 % IJ SOLN: 9.0000 mL | INTRAMUSCULAR | Status: DC | PRN

## 2011-06-17 MED ORDER — ONDANSETRON HCL 4 MG/2ML IJ SOLN: 4.0000 mg | Freq: Four times a day (QID) | INTRAMUSCULAR | Status: DC | PRN

## 2011-06-17 MED ORDER — LACTULOSE 10 GM/15ML PO SOLN
30.0000 g | Freq: Three times a day (TID) | ORAL | Status: DC
  Administered 2011-06-17 – 2011-06-18 (×3): 30 g via ORAL
  Filled 2011-06-17 (×10): qty 45

## 2011-06-17 MED ORDER — INDOMETHACIN 25 MG SUPPOSITORY
25.0000 mg | Freq: Four times a day (QID) | RECTAL | Status: DC | PRN
  Filled 2011-06-17: qty 1

## 2011-06-17 MED ORDER — DIPHENHYDRAMINE HCL 50 MG/ML IJ SOLN: 12.5000 mg | Freq: Four times a day (QID) | INTRAMUSCULAR | Status: DC | PRN

## 2011-06-17 MED ORDER — HYDROMORPHONE HCL PF 1 MG/ML IJ SOLN
1.0000 mg | INTRAMUSCULAR | Status: DC | PRN
  Administered 2011-06-17 – 2011-06-18 (×3): 2 mg via INTRAVENOUS
  Filled 2011-06-17 (×2): qty 2

## 2011-06-17 MED ORDER — NALOXONE HCL 0.4 MG/ML IJ SOLN: 0.4000 mg | INTRAMUSCULAR | Status: DC | PRN

## 2011-06-17 MED ORDER — HYDROMORPHONE HCL 2 MG PO TABS: 4.0000 mg | ORAL_TABLET | ORAL | Status: DC | PRN

## 2011-06-17 NOTE — Progress Notes (Signed)
Room 5121 - [facility]  [facility] RN Visit  Related admission to [facility] diagnosis of advanced metastatic uterine cancer.   Pt is DNR code.  Pt arousable & oriented, lying in bed, with complaints of abdominal pain 5-6/10.  Patient's mother present with Attending Dr. Jomarie Longs.   Along with Dr. Jomarie Longs, discussed with patient her wishes for discharge.  Pt requests to go home to be with 2 young children which she states she misses terribly.  Pt states she is open to transition to [facility] as needed when closer to EOL but is ok for a home death in the presence of her children.  Discussed with DON[facility] Jan P and recommended [facility] could support  Dilaudid drip for patient (CADD  Pump) when discharged home as she is unable to take Morphine subling.  Discussed with Dr. Jomarie Longs who placed orders for [facility] - this RN spoke with [facility] rep-she will confirm prescription is available and supplies will be brought to 5100 for change over to CADD pump at discharge scheduled for 6/6.  Please call [facility] @ [phone] with any needs.  Thank you.  Joneen Boers, RN  [facility]  Hospice Liaison

## 2011-06-17 NOTE — Progress Notes (Signed)
Subjective: Still with abd pain 8-9/10   Objective: Vital signs in last 24 hours: Temp:  [97 F (36.1 C)-99.3 F (37.4 C)] 99 F (37.2 C) (06/05 0510) Pulse Rate:  [99-119] 119  (06/05 0510) Resp:  [15-21] 18  (06/05 0510) BP: (112-140)/(70-100) 135/76 mmHg (06/05 0510) SpO2:  [97 %-100 %] 99 % (06/05 0510) Weight change:  Last BM Date: 06/15/11  Intake/Output from previous day: 06/04 0701 - 06/05 0700 In: 2908.3 [P.O.:120; I.V.:2788.3] Out: 4150 [Urine:600; Drains:3550] Total I/O In: -  Out: 600 [Drains:600]   Physical Exam: General: Alert, awake, oriented x3,  HEENT: No bruits, no goiter. Heart: Regular rate and rhythm, without murmurs, rubs, gallops. Lungs: Clear to auscultation bilaterally. Abdomen: Soft, slightly distended, tender, positive bowel sounds. Peritoneal catheter present, G -tube Extremities: No clubbing cyanosis or edema with positive pedal pulses. Neuro: Grossly intact, nonfocal.    Lab Results: Basic Metabolic Panel:  Basename 06/17/11 0525 06/16/11 0110  NA 141 139  K 3.3* 3.1*  CL 95* 88*  CO2 33* 36*  GLUCOSE 84 107*  BUN 44* 65*  CREATININE 2.87* 4.26*  CALCIUM 8.7 8.9  MG -- --  PHOS -- --   Liver Function Tests:  [facility] 06/16/11 0201 06/15/11 1641  AST 30 43*  ALT 31 40*  ALKPHOS 167* 191*  BILITOT 0.4 0.5  PROT 7.4 8.3  ALBUMIN 2.3* 2.5*    Basename 06/15/11 1641  LIPASE 20  AMYLASE --   No results found for this basename: AMMONIA:2 in the last 72 hours CBC:  Basename 06/16/11 0110 06/15/11 1641  WBC 14.2* 14.3*  NEUTROABS -- 12.0*  HGB 7.6* 8.2*  HCT 23.9* 26.6*  MCV 85.1 86.6  PLT 547* 600*   Cardiac Enzymes: No results found for this basename: CKTOTAL:3,CKMB:3,CKMBINDEX:3,TROPONINI:3 in the last 72 hours BNP: No results found for this basename: PROBNP:3 in the last 72 hours D-Dimer: No results found for this basename: DDIMER:2 in the last 72 hours CBG: No results found for this basename: GLUCAP:6 in  the last 72 hours Hemoglobin A1C: No results found for this basename: HGBA1C in the last 72 hours Fasting Lipid Panel: No results found for this basename: CHOL,HDL,LDLCALC,TRIG,CHOLHDL,LDLDIRECT in the last 72 hours Thyroid Function Tests:  Basename 06/16/11 0110  TSH 2.160  T4TOTAL --  FREET4 --  T3FREE --  THYROIDAB --   Anemia Panel: No results found for this basename: VITAMINB12,FOLATE,FERRITIN,TIBC,IRON,RETICCTPCT in the last 72 hours Coagulation:  Basename 06/16/11 0110  LABPROT 15.7*  INR 1.22   Urine Drug Screen: Drugs of Abuse  No results found for this basename: labopia, cocainscrnur, labbenz, amphetmu, thcu, labbarb    Alcohol Level: No results found for this basename: ETH:2 in the last 72 hours Urinalysis:  Basename 06/16/11 0530  COLORURINE YELLOW  LABSPEC 1.018  PHURINE 5.0  GLUCOSEU NEGATIVE  HGBUR NEGATIVE  BILIRUBINUR SMALL*  KETONESUR NEGATIVE  PROTEINUR 30*  UROBILINOGEN 0.2  NITRITE NEGATIVE  LEUKOCYTESUR NEGATIVE    No results found for this or any previous visit (from the past 240 hour(s)).  Studies/Results: Ct Abdomen Pelvis Wo Contrast  06/15/2011  *RADIOLOGY REPORT*  Clinical Data: Abdominal pain  CT ABDOMEN AND PELVIS WITHOUT CONTRAST  Technique:  Multidetector CT imaging of the abdomen and pelvis was performed following the standard protocol without intravenous contrast.  Comparison: 05/18/2011  Findings: Imaging of the lung bases shows left base collapse / consolidation with small to moderate left pleural effusion.  There is been interval progression of metastatic disease throughout the  abdomen and pelvis.  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  Fluid along the right aspect of the liver now measures 2.6 cm in thickness compared 1.6 cm previously.  There is more obvious mass effect on the underlying liver today.  Similar findings are noted around the spleen.  The stomach is collapsed by the adjacent to intraperitoneal disease.   High attenuation material the gallbladder lumen is compatible with vicarious excretion of previously administered contrast material.  Gastrostomy tube is evident within the distal stomach.  No abdominal aortic aneurysm.  Adrenal glands are unremarkable. The fullness of the left intrarenal collecting system and ureter has progressed in the interval.  No right hydronephrosis at this time.  Mesenteric and periceliac disease has progressed.  Small bowel loops in the left abdomen are distended up to 2.9 cm but contrast flows through these distended loops into more distal less distended bowel loops.  Imaging through the pelvis shows interval increase and intraperitoneal metastatic disease.  There are some very collapsed small bowel loops in the right lower quadrant.  Colon is filled with air and stool.  Bone windows reveal no worrisome lytic or sclerotic osseous lesions.  IMPRESSION: Substantial interval progression of intraperitoneal metastatic disease with increasing mass effect on the liver, spleen, and stomach.  The intraperitoneal disease generates some distention of proximal small bowel, but this is similar to the previous study. No overt small bowel obstruction at this time.  Original Report Authenticated By: ERIC A. MANSELL, M.D.   Ct Head Wo Contrast  06/15/2011  *RADIOLOGY REPORT*  Clinical Data: Visual disturbance.  CT HEAD WITHOUT CONTRAST  Technique:  Contiguous axial images were obtained from the base of the skull through the vertex without contrast.  Comparison: 06/05/2005  Findings: There is no evidence for acute hemorrhage, hydrocephalus, mass lesion, or abnormal extra-axial fluid collection.  No definite CT evidence for acute infarction.  The visualized paranasal sinuses and mastoid air cells are clear.  IMPRESSION: Stable.  No acute findings.  Original Report Authenticated By: ERIC A. MANSELL, M.D.   US Renal  06/16/2011  *RADIOLOGY REPORT*  Clinical Data:  Renal failure, uterine cancer   RENAL/URINARY TRACT ULTRASOUND COMPLETE  Comparison:  None.  Findings:  Right Kidney:  Normal in size and parenchymal echogenicity.  No evidence of mass or hydronephrosis.  Left Kidney:  Normal in size and parenchymal echogenicity.  No evidence of mass. There is mild - moderate hydronephrosis, similar to yesterday's CT scan.  Bladder:  Appears normal for degree of bladder distention.  Ascites is noted.  IMPRESSION: Mild - moderate left hydronephrosis.  Ascites.  Original Report Authenticated By: Brandon Melnick, M.D.   Ir Perc [employer] Perit Cath [facility]  06/16/2011  *RADIOLOGY REPORT*  Clinical Data/Indication: ENDOMETRIAL CARCINOMA AND ASCITES  IR PERC TUN PERIT CATH WITHOUT PORT S+I WITH IMAGE  Sedation: Versed three mg, Fentanyl 100 mg.  Total Moderate Sedation Time: 20 minutes.  Fluoroscopy Time: 0.8 minutes.  Procedure: The procedure, risks, benefits, and alternatives were explained to the patient. Questions regarding the procedure were encouraged and answered. The patient understands and consents to the procedure.  The right lower quadrant was prepped with betadine in a sterile fashion, and a sterile drape was applied covering the operative field. A sterile gown and sterile gloves were used for the procedure.  Under sonographic guidance, an 18 gauge needle was inserted into the peritoneal space via a right lower quadrant approach.  It was removed over an Amplatz.  The included peel-away  sheath was advanced over the Amplatz into the peritoneal space.  A second incision was made 10 cm from the puncture site.  The leading edge of the catheter was then tunneled from the second incision out the initial puncture site incision.  The catheter was then fed through the peel-away sheath.  Peel-away sheath was removed.  The initial site was closed utilizing eight to layer approach.  A 300 volts Monocryl for the subcutaneous tissue and eight 4-0 Vicryl subcuticular stitch.  The second incision was closed with an O Prolene  pursestring knot.  Dermabond was applied.  Findings: Imaging demonstrates placement of a right lower quadrant Aspira drain  Complications: None.  IMPRESSION: Successful right lower quadrant Aspira drain.  Original Report Authenticated By: Donavan Burnet, M.D.   Dg Abd Acute W/chest  06/15/2011  *RADIOLOGY REPORT*  Clinical Data: Abdominal pain.  ACUTE ABDOMEN SERIES (ABDOMEN 2 VIEW & CHEST 1 VIEW)  Comparison: 06/03/2011  Findings: Upright chest shows left base atelectasis with small left pleural effusion.  The left base is better aerated than on the previous study.  Right PICC line has been removed in the interval. Right Port-A-Cath remains in place.  Right-sided decubitus film shows no evidence for intraperitoneal free air.  The supine abdominal film shows gas distributed along the length of a mildly distended colon.  There are some mildly distended small bowel loops in the left abdomen measuring up to 4 cm in diameter.  A gastrostomy tube is evident.  IMPRESSION: Interval improvement in left base collapse / consolidation with small left pleural effusion now visible.  No intraperitoneal free air.  Mild gaseous distention of colon with some mildly gas distended small bowel the left abdomen.  Imaging features could be related to gastroenteritis, ileus or evolving obstruction.  Original Report Authenticated By: ERIC A. MANSELL, M.D.    Medications: Scheduled Meds:   . sodium chloride   Intravenous STAT  . sodium chloride   Intravenous Once  . ceFAZolin      .  ceFAZolin (ANCEF) IV  1 g Intravenous Once  . fentaNYL  50 mcg Transdermal Q72H  . lactulose  30 g Oral TID  . lactulose  300 mL Rectal Daily  . potassium chloride  10 mEq Intravenous Q1 Hr x 3  . promethazine  12.5 mg Intravenous Q6H  . sodium chloride  3 mL Intravenous Q12H   Continuous Infusions:   . sodium chloride 100 mL/hr at 06/17/11 0846   PRN Meds:.fentaNYL, HYDROmorphone (DILAUDID) injection, HYDROmorphone, indomethacin,  midazolam, ondansetron (ZOFRAN) IV, ondansetron, sodium chloride, DISCONTD:  HYDROmorphone (DILAUDID) injection  Assessment/Plan: 1. End stage endometrial cancer 2. 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 Anemia 4. ARF 5. Malnutrition Plan: Add Dilaudid PCA pump Does not want Liquid morphine(ROxanol) Use Peritoneal catheter PRN Home with hospice tomorrow, D/W patient and Hospice RN(ROse)   LOS: 2 days   [facility] Pager: [phone] 06/17/2011, 9:49 AM

## 2011-06-17 NOTE — Progress Notes (Signed)
[facility]  Homecare SW visit Related admission. Patient was lying in bed, drowsy but alert. Patient's mother and family friend at bedside. Patient stated she was in pain.  SW contacted patient's RN Aurea Graff. Patient experienced some vomiting during visit. Patient will return to [facility] homecare upon discharge. Patient states she would like to stay home as long as possible.Please call [facility] at [phone] with any questions or concerns. Vonna Kotyk, LCSWA

## 2011-06-17 NOTE — Progress Notes (Signed)
Subjective: Endometrial ca; recurrent abd ascites Peritoneal aspira tunneled catheter placed 6/4 in IR 2.1 liters removed 6/4 per chart ? During procedure   Objective: Vital signs in last 24 hours: Temp:  [97 F (36.1 C)-99.3 F (37.4 C)] 99 F (37.2 C) (06/05 0510) Pulse Rate:  [99-119] 119  (06/05 0510) Resp:  [15-21] 18  (06/05 0510) BP: (112-140)/(70-100) 135/76 mmHg (06/05 0510) SpO2:  [97 %-100 %] 99 % (06/05 0510) Last BM Date: 06/15/11  Intake/Output from previous day: 06/04 0701 - 06/05 0700 In: 2908.3 [P.O.:120; I.V.:2788.3] Out: 4150 [Urine:600; Drains:3550] Intake/Output this shift:    PE:  Afeb; vss Catheter in place; intact No sign of infection Sl tender to touch Capped now abd soft; no real distention   Lab Results:   Basename 06/16/11 0110 06/15/11 1641  WBC 14.2* 14.3*  HGB 7.6* 8.2*  HCT 23.9* 26.6*  PLT 547* 600*   BMET  Basename 06/17/11 0525 06/16/11 0110  NA 141 139  K 3.3* 3.1*  CL 95* 88*  CO2 33* 36*  GLUCOSE 84 107*  BUN 44* 65*  CREATININE 2.87* 4.26*  CALCIUM 8.7 8.9   PT/INR  Basename 06/16/11 0110  LABPROT 15.7*  INR 1.22   ABG No results found for this basename: PHART:2,PCO2:2,PO2:2,HCO3:2 in the last 72 hours  Studies/Results: Ct Abdomen Pelvis Wo Contrast  06/15/2011  *RADIOLOGY REPORT*  Clinical Data: Abdominal pain  CT ABDOMEN AND PELVIS WITHOUT CONTRAST  Technique:  Multidetector CT imaging of the abdomen and pelvis was performed following the standard protocol without intravenous contrast.  Comparison: 05/18/2011  Findings: Imaging of the lung bases shows left base collapse / consolidation with small to moderate left pleural effusion.  There is been interval progression of metastatic disease throughout the abdomen and pelvis.  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  Fluid along the right aspect of the liver now measures 2.6 cm in thickness compared 1.6 cm previously.  There is more obvious mass effect  on the underlying liver today.  Similar findings are noted around the spleen.  The stomach is collapsed by the adjacent to intraperitoneal disease.  High attenuation material the gallbladder lumen is compatible with vicarious excretion of previously administered contrast material.  Gastrostomy tube is evident within the distal stomach.  No abdominal aortic aneurysm.  Adrenal glands are unremarkable. The fullness of the left intrarenal collecting system and ureter has progressed in the interval.  No right hydronephrosis at this time.  Mesenteric and periceliac disease has progressed.  Small bowel loops in the left abdomen are distended up to 2.9 cm but contrast flows through these distended loops into more distal less distended bowel loops.  Imaging through the pelvis shows interval increase and intraperitoneal metastatic disease.  There are some very collapsed small bowel loops in the right lower quadrant.  Colon is filled with air and stool.  Bone windows reveal no worrisome lytic or sclerotic osseous lesions.  IMPRESSION: Substantial interval progression of intraperitoneal metastatic disease with increasing mass effect on the liver, spleen, and stomach.  The intraperitoneal disease generates some distention of proximal small bowel, but this is similar to the previous study. No overt small bowel obstruction at this time.  Original Report Authenticated By: ERIC A. MANSELL, M.D.   Ct Head Wo Contrast  06/15/2011  *RADIOLOGY REPORT*  Clinical Data: Visual disturbance.  CT HEAD WITHOUT CONTRAST  Technique:  Contiguous axial images were obtained from the base of the skull through the vertex without contrast.  Comparison: 06/05/2005  Findings: There is no evidence for acute hemorrhage, hydrocephalus, mass lesion, or abnormal extra-axial fluid collection.  No definite CT evidence for acute infarction.  The visualized paranasal sinuses and mastoid air cells are clear.  IMPRESSION: Stable.  No acute findings.  Original  Report Authenticated By: ERIC A. MANSELL, M.D.   US Renal  06/16/2011  *RADIOLOGY REPORT*  Clinical Data:  Renal failure, uterine cancer  RENAL/URINARY TRACT ULTRASOUND COMPLETE  Comparison:  None.  Findings:  Right Kidney:  Normal in size and parenchymal echogenicity.  No evidence of mass or hydronephrosis.  Left Kidney:  Normal in size and parenchymal echogenicity.  No evidence of mass. There is mild - moderate hydronephrosis, similar to yesterday's CT scan.  Bladder:  Appears normal for degree of bladder distention.  Ascites is noted.  IMPRESSION: Mild - moderate left hydronephrosis.  Ascites.  Original Report Authenticated By: Brandon Melnick, M.D.   Ir Perc [employer] Perit Cath [facility]  06/16/2011  *RADIOLOGY REPORT*  Clinical Data/Indication: ENDOMETRIAL CARCINOMA AND ASCITES  IR PERC TUN PERIT CATH WITHOUT PORT S+I WITH IMAGE  Sedation: Versed three mg, Fentanyl 100 mg.  Total Moderate Sedation Time: 20 minutes.  Fluoroscopy Time: 0.8 minutes.  Procedure: The procedure, risks, benefits, and alternatives were explained to the patient. Questions regarding the procedure were encouraged and answered. The patient understands and consents to the procedure.  The right lower quadrant was prepped with betadine in a sterile fashion, and a sterile drape was applied covering the operative field. A sterile gown and sterile gloves were used for the procedure.  Under sonographic guidance, an 18 gauge needle was inserted into the peritoneal space via a right lower quadrant approach.  It was removed over an Amplatz.  The included peel-away sheath was advanced over the Amplatz into the peritoneal space.  A second incision was made 10 cm from the puncture site.  The leading edge of the catheter was then tunneled from the second incision out the initial puncture site incision.  The catheter was then fed through the peel-away sheath.  Peel-away sheath was removed.  The initial site was closed utilizing eight to layer approach.  A  300 volts Monocryl for the subcutaneous tissue and eight 4-0 Vicryl subcuticular stitch.  The second incision was closed with an O Prolene pursestring knot.  Dermabond was applied.  Findings: Imaging demonstrates placement of a right lower quadrant Aspira drain  Complications: None.  IMPRESSION: Successful right lower quadrant Aspira drain.  Original Report Authenticated By: Donavan Burnet, M.D.   Dg Abd Acute W/chest  06/15/2011  *RADIOLOGY REPORT*  Clinical Data: Abdominal pain.  ACUTE ABDOMEN SERIES (ABDOMEN 2 VIEW & CHEST 1 VIEW)  Comparison: 06/03/2011  Findings: Upright chest shows left base atelectasis with small left pleural effusion.  The left base is better aerated than on the previous study.  Right PICC line has been removed in the interval. Right Port-A-Cath remains in place.  Right-sided decubitus film shows no evidence for intraperitoneal free air.  The supine abdominal film shows gas distributed along the length of a mildly distended colon.  There are some mildly distended small bowel loops in the left abdomen measuring up to 4 cm in diameter.  A gastrostomy tube is evident.  IMPRESSION: Interval improvement in left base collapse / consolidation with small left pleural effusion now visible.  No intraperitoneal free air.  Mild gaseous distention of colon with some mildly gas distended small bowel the left abdomen.  Imaging features could be related  to gastroenteritis, ileus or evolving obstruction.  Original Report Authenticated By: ERIC A. MANSELL, M.D.    Anti-infectives: Anti-infectives     Start     Dose/Rate Route Frequency Ordered Stop   06/16/11 1150   ceFAZolin (ANCEF) 1-5 GM-% IVPB     Comments: ARNAUD, LINDA: cabinet override         06/16/11 1150 06/16/11 2359   06/16/11 0600   ceFAZolin (ANCEF) IVPB 1 g/50 mL premix        1 g 100 mL/hr over 30 Minutes Intravenous  Once 06/15/11 2335 06/16/11 1221          Assessment/Plan: s/p RLQ tunneled aspira peritoneal drain  placed 6/4 Endometrial ca Catheter intact To be used as needed for abd ascites Plan per Oncology- Dr Estella Husk A 06/17/2011

## 2011-06-17 NOTE — Progress Notes (Signed)
Medical Oncology  Appreciate hospitalist notifying me of admission; patient seen with mother here now (mother does not speak [place]). She is followed by [facility] and they are also aware of admission. Patient's family had taken her to a religious event in [place] and by arrival back at [place] airport she was feeling so badly that they presented to ED.  Patient is awake and alert, more comfortable since peritoneal catheter placed yesterday, to be used prn to drain malignant ascites. Hospice RNs will manage this at home after discharge. She is drinking some liquids, G tube is presently to suction and pain is well-controlled with prn IV dilaudid. She had been on duragesic patch at last hospital discharge, but prn dilaudid via pump and PAC at home would probably be best now. She has not been completely obstructed and was still having bowel movements using lactulose per G tube (clamping tube x 30-60 min after each dose). She had apparent tumor fever during last hospitalization which was managed with indocin suppositories prn.  Labs all noted, including WBC and Hgb on 06-16-11 and CMET this am.   Afebrile now. BP 135/76, HR 119 regular, respirations not labored supine RA. Speech fluent and appropriate. Looks generally comfortable. PAC site ok. Lungs clear ant/laterally. Cor tachy, regular, no gallop. Dressings dry at peritoneal drain, which is clamped, some serosanguinous fluid in tube. G tube dressings dry, to suction with clear fluid. Abdomen not distended or tight now, quiet, not tender to gentle palpation. LE no pitting edema. Moves all extremities.  A/Rec;  1.Rapidly recurrent, aggressive metastatic endometrial carcinoma: diagnosed 7 mo after full term pregnancy, IIIC at optimal debulking by Dr Yolande Jolly June 2012, recurred within ~ 2 months of completing six cycles of taxol/carboplatin with RT in Jan 2012. No response to cisplatin + adriamycin followed by one cycle of adriamycin given 05-20-11.  She is not candidate for further surgery for the cancer and not felt candidate for surgery for the bowel obstruction related to the cancer. Performance status is too poor for further chemotherapy, which was not helpful regardless.Code status per my discussion with patient was clearly NCB. Hospice will be assisting again after DC.The language barrier with family seems to contribute to multiple admissions. 2.PAC, G tube, peritoneal drain all in place for comfort care. TNA DCd at last discharge and will not improve overall situation now. 3.difficult social situation: not [place] citizen, 3 very young daughters, mother here on emergency VISA from [place] and is assuming care of the children.  Appreciate excellent care by hospitalist service now. I would be comfortable with DC home with hospice at any point now. Please call if questions or if I can help further now. I will see her again if still in hospital later this week.  Jama Flavors, MD [phone]

## 2011-06-18 DIAGNOSIS — D649 Anemia, unspecified: Secondary | ICD-10-CM

## 2011-06-18 DIAGNOSIS — N8502 Endometrial intraepithelial neoplasia [EIN]: Secondary | ICD-10-CM

## 2011-06-18 DIAGNOSIS — N170 Acute kidney failure with tubular necrosis: Secondary | ICD-10-CM

## 2011-06-18 DIAGNOSIS — R180 Malignant ascites: Secondary | ICD-10-CM

## 2011-06-18 LAB — GLUCOSE, CAPILLARY

## 2011-06-18 MED ORDER — NYSTATIN 100000 UNIT/ML MT SUSP: 5.0000 mL | Freq: Four times a day (QID) | OROMUCOSAL | Status: AC

## 2011-06-18 MED ORDER — FLUCONAZOLE 100 MG PO TABS: 100.0000 mg | ORAL_TABLET | Freq: Every day | ORAL | Status: AC

## 2011-06-18 MED ORDER — FLUCONAZOLE 100 MG PO TABS
100.0000 mg | ORAL_TABLET | Freq: Every day | ORAL | Status: DC
  Administered 2011-06-18: 100 mg via ORAL
  Filled 2011-06-18 (×2): qty 1

## 2011-06-18 MED ORDER — HYDROMORPHONE 0.3 MG/ML IV SOLN: INTRAVENOUS | Status: DC

## 2011-06-18 MED ORDER — NYSTATIN 100000 UNIT/ML MT SUSP
5.0000 mL | Freq: Four times a day (QID) | OROMUCOSAL | Status: DC
  Administered 2011-06-18 (×4): 500000 [IU] via ORAL
  Filled 2011-06-18 (×8): qty 5

## 2011-06-18 MED ORDER — INDOMETHACIN 25 MG SUPPOSITORY: 25.0000 mg | Freq: Four times a day (QID) | RECTAL | Status: AC | PRN

## 2011-06-18 MED ORDER — HYDROMORPHONE 0.3 MG/ML IV SOLN: INTRAVENOUS | Status: AC

## 2011-06-18 MED ORDER — BLISTEX EX OINT
TOPICAL_OINTMENT | CUTANEOUS | Status: DC | PRN
  Filled 2011-06-18: qty 10

## 2011-06-18 NOTE — Clinical Social Work Psychosocial (Signed)
     Clinical Social Work Department BRIEF PSYCHOSOCIAL ASSESSMENT 06/18/2011  Patient:  Krystal [name], Krystal [name]     Account Number:  [id number]     Admit date:  06/15/2011  Clinical Social Worker:  Dennison Bulla  Date/Time:  06/18/2011 09:30 AM  Referred by:  RN  Date Referred:  06/18/2011 Referred for  Residential hospice placement   Other Referral:   Interview type:  Patient Other interview type:    PSYCHOSOCIAL DATA Living Status:  FAMILY Admitted from facility:   Level of care:   Primary support name:  Hilda Lias Primary support relationship to patient:  FRIEND Degree of support available:   Adequate    CURRENT CONCERNS Current Concerns  Post-Acute Placement   Other Concerns:    SOCIAL WORK ASSESSMENT / PLAN CSW received referral from RN during progression meeting. Before today, patient had chosen to go home with hospice following. Per RN, patient has changed her mind and prefers residential hospice now. CSW reviewed chart and met with patient.    CSW introduced myself and met with patient while hospice RN Okey Dupre) was in the room. Patient's mother was in the room but does not speak [place]. Patient reports that she prefers residential hospice now. CSW offered choice and patient chose [facility]. Hospice RN reported that she would contact hospice SW and inform SW of patient's desires. CSW is available if needed.   Assessment/plan status:  Psychosocial Support/Ongoing Assessment of Needs Other assessment/ plan:   Information/referral to community resources:   Hospice Choice    PATIENTS/FAMILYS RESPONSE TO PLAN OF CARE: Patient was alert and oriented but complaining of pain. Patient was agreeable to speak with CSW and RN. Patient reports that she feels home is not an option now. Patient appreciative of visit.

## 2011-06-18 NOTE — Consult Note (Signed)
Agree with above 

## 2011-06-18 NOTE — Progress Notes (Signed)
Patient refused her PCA pump, pain medication given IV push, remains lethargic.

## 2011-06-18 NOTE — Progress Notes (Signed)
Room 5121 - [facility]  [facility] RN Visit  Related admission to [facility] diagnosis of metastatic uterine cancer.  Pt is DNR code.    Pt arousable, lethargic & oriented, lying in bed, with complaints of pain at a level of 2/10.  Mother present at visit and Renato Gails and friend entered following visit.  [facility] Chaplain Chris and [facility] [place] present at intervals during visit.  Pt was scheduled to return home with [facility] home care services and upon visit today, patient states she no longer wishes to go home but wishes to go to [facility].    [facility] rep Lupita Leash has CADD pump and Dilaudid infusion on hold until bed at [facility] available.  All info for [facility] bed offer routed to [facility] including all imaging, labs, H&P, updated notes from consultants.  Will route DC summary when posted.  [facility] SW and [facility] Liaison SW contacted re: [facility] bed offer.    [facility] Director called to say no bed availability today 6/5.  Dr. Jomarie Longs, Staff RN and [facility] [place] advised of no bed availability for today.      Please call [facility] @ [phone] with any needs.  Thank you.  Joneen Boers, RN  [facility]  Hospice Liaison

## 2011-06-18 NOTE — Progress Notes (Signed)
Subjective: Could not tolerate the PCA due to alarms going off, pain 2-3/10   Objective: Vital signs in last 24 hours: Temp:  [98.1 F (36.7 C)-99.9 F (37.7 C)] 98.1 F (36.7 C) (06/06 1411) Pulse Rate:  [122-127] 125  (06/06 1411) Resp:  [16-20] 20  (06/06 1411) BP: (121-128)/(83-92) 121/83 mmHg (06/06 1411) SpO2:  [96 %-99 %] 99 % (06/06 1411) Weight change:  Last BM Date: 06/15/11  Intake/Output from previous day: 06/05 0701 - 06/06 0700 In: 1965.4 [I.V.:1965.4] Out: 600 [Drains:600] Total I/O In: 170 [P.O.:170] Out: -    Physical Exam: General:drowsy, easily aroused, oriented x3,  HEENT: No bruits, no goiter. Heart: Regular rate and rhythm, without murmurs, rubs, gallops. Lungs: Clear to auscultation bilaterally. Abdomen: Soft, slightly distended, tender, positive bowel sounds. Peritoneal catheter present, G -tube Extremities: No clubbing cyanosis or edema with positive pedal pulses. Neuro: Grossly intact, nonfocal.    Lab Results: Basic Metabolic Panel:  Basename 06/17/11 0525 06/16/11 0110  NA 141 139  K 3.3* 3.1*  CL 95* 88*  CO2 33* 36*  GLUCOSE 84 107*  BUN 44* 65*  CREATININE 2.87* 4.26*  CALCIUM 8.7 8.9  MG -- --  PHOS -- --   Liver Function Tests:  Basename 06/16/11 0201  AST 30  ALT 31  ALKPHOS 167*  BILITOT 0.4  PROT 7.4  ALBUMIN 2.3*   No results found for this basename: LIPASE:2,AMYLASE:2 in the last 72 hours No results found for this basename: AMMONIA:2 in the last 72 hours CBC:  Basename 06/16/11 0110  WBC 14.2*  NEUTROABS --  HGB 7.6*  HCT 23.9*  MCV 85.1  PLT 547*   Cardiac Enzymes: No results found for this basename: CKTOTAL:3,CKMB:3,CKMBINDEX:3,TROPONINI:3 in the last 72 hours BNP: No results found for this basename: PROBNP:3 in the last 72 hours D-Dimer: No results found for this basename: DDIMER:2 in the last 72 hours CBG:  Basename 06/18/11 0757  GLUCAP 82   Hemoglobin A1C: No results found for this  basename: HGBA1C in the last 72 hours Fasting Lipid Panel: No results found for this basename: CHOL,HDL,LDLCALC,TRIG,CHOLHDL,LDLDIRECT in the last 72 hours Thyroid Function Tests:  Basename 06/16/11 0110  TSH 2.160  T4TOTAL --  FREET4 --  T3FREE --  THYROIDAB --   Anemia Panel: No results found for this basename: VITAMINB12,FOLATE,FERRITIN,TIBC,IRON,RETICCTPCT in the last 72 hours Coagulation:  Basename 06/16/11 0110  LABPROT 15.7*  INR 1.22   Urine Drug Screen: Drugs of Abuse  No results found for this basename: labopia,  cocainscrnur,  labbenz,  amphetmu,  thcu,  labbarb    Alcohol Level: No results found for this basename: ETH:2 in the last 72 hours Urinalysis:  Basename 06/16/11 0530  COLORURINE YELLOW  LABSPEC 1.018  PHURINE 5.0  GLUCOSEU NEGATIVE  HGBUR NEGATIVE  BILIRUBINUR SMALL*  KETONESUR NEGATIVE  PROTEINUR 30*  UROBILINOGEN 0.2  NITRITE NEGATIVE  LEUKOCYTESUR NEGATIVE    No results found for this or any previous visit (from the past 240 hour(s)).  Studies/Results: No results found.  Medications: Scheduled Meds:    . sodium chloride   Intravenous Once  . fluconazole  100 mg Oral Daily  . HYDROmorphone PCA 0.3 mg/mL   Intravenous Q4H  . lactulose  30 g Oral TID  . lactulose  300 mL Rectal Daily  . nystatin  5 mL Oral QID  . promethazine  12.5 mg Intravenous Q6H  . sodium chloride  3 mL Intravenous Q12H   Continuous Infusions:    . DISCONTD:  sodium chloride 75 mL/hr at 06/17/11 2313   PRN Meds:.diphenhydrAMINE, diphenhydrAMINE, HYDROmorphone (DILAUDID) injection, indomethacin, lip balm, naloxone, ondansetron (ZOFRAN) IV, ondansetron, sodium chloride, sodium chloride  Assessment/Plan: 1. End stage endometrial cancer 2. 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 Anemia 4. ARF 5. Malnutrition Plan: Back on Dilaudid PRN Use Peritoneal catheter PRN Changed her mind, wants Residential hospice now Terminal stages    LOS: 3 days   [facility] Pager: [phone] 06/18/2011, 5:41 PM

## 2011-06-19 DIAGNOSIS — N170 Acute kidney failure with tubular necrosis: Secondary | ICD-10-CM

## 2011-06-19 DIAGNOSIS — R180 Malignant ascites: Secondary | ICD-10-CM

## 2011-06-19 DIAGNOSIS — N8502 Endometrial intraepithelial neoplasia [EIN]: Secondary | ICD-10-CM

## 2011-06-19 DIAGNOSIS — D649 Anemia, unspecified: Secondary | ICD-10-CM

## 2011-06-19 MED ORDER — ONDANSETRON HCL 4 MG/2ML IJ SOLN: 8.0000 mg | Freq: Four times a day (QID) | INTRAMUSCULAR | Status: DC | PRN

## 2011-06-19 MED ORDER — ONDANSETRON 8 MG/NS 50 ML IVPB
8.0000 mg | Freq: Four times a day (QID) | INTRAVENOUS | Status: DC | PRN
  Administered 2011-06-19: 8 mg via INTRAVENOUS
  Filled 2011-06-19: qty 8

## 2011-06-19 MED ORDER — PROMETHAZINE HCL 25 MG/ML IJ SOLN
12.5000 mg | Freq: Four times a day (QID) | INTRAMUSCULAR | Status: DC | PRN
  Administered 2011-06-19: 12.5 mg via INTRAVENOUS
  Filled 2011-06-19: qty 1

## 2011-06-19 MED ORDER — HEPARIN SOD (PORK) LOCK FLUSH 100 UNIT/ML IV SOLN
500.0000 [IU] | Freq: Once | INTRAVENOUS | Status: AC
  Administered 2011-06-19: 500 [IU]
  Filled 2011-06-19: qty 5

## 2011-06-19 NOTE — Progress Notes (Signed)
Clinical Social Work  Per [employer] (RN hospice) patient is ready to transfer to [facility]. CSW informed patient, boyfriend and RN of dc and all were agreeable. CSW prepared dc packet and coordinated transportation via [facility]. CSW is signing off.  [place], [place] [phone]

## 2011-06-19 NOTE — Progress Notes (Signed)
DC to hospice by ambulance. Family at side.

## 2011-06-19 NOTE — Progress Notes (Addendum)
Room 5121 - Garnet [place]  [facility]  [facility] RN Visit  Related admission to [facility] diagnosis of metastatic uterine cancer.   Pt is DNR code.    Pt alert, oriented, sitting up in chair-pt had attempted to wash up and became light headed with nausea & vomiting per staff RN.  Mother and 2 family friends present.    Pt to transfer to [facility] today asap.  DC summary routed to [facility], prescription for Dilaudid drip and OOF DNR on shadow chart - discussed with staff RN.  Discussed transport with SW [place].    Please call [facility] @ [phone] with any needs.  Thank you.  Joneen Boers, RN  [facility]  Hospice Liaison

## 2011-06-19 NOTE — Discharge Summary (Signed)
Physician Discharge Summary  Patient ID: NATALI LAVALLEE MRN: [id number] DOB/AGE: 03-23-1970 41 y.o.  Admit date: 06/15/2011 Discharge date: 06/19/2011  Oncologist:  [name], MD, MD   Discharge Diagnoses:   1. End stage Endometrial cancer 2. Recurrent malignant ascites s/p peritoneal catheter 3. Partial SBO s/p G tube to ILWS PRN 4. ARF (acute renal failure) 5. Anemia 6. Thrush  Medication List  As of 06/19/2011  8:23 AM   TAKE these medications         fluconazole 100 MG tablet   Commonly known as: DIFLUCAN   Take 1 tablet (100 mg total) by mouth daily.      HYDROmorphone PCA 0.3 mg/mL 0.3 mg/mL Soln   Commonly known as: DILAUDID   500mg /152ml, continuous 1mg /hr and bolus of 0.5mg  Q79min PRN  Per Standard dosing protocol      indomethacin 25 mg Supp   Commonly known as: INDOCIN   Place 1 suppository (25 mg total) rectally 4 (four) times daily as needed (prn fever).      lactulose 10 GM/15ML solution   Commonly known as: CHRONULAC   20 g by Gastric Tube route 3 (three) times daily. Clamp tube for 60 minutes after putting Lactulose in tube.      nystatin 100000 UNIT/ML suspension   Commonly known as: MYCOSTATIN   Take 5 mLs (500,000 Units total) by mouth 4 (four) times daily.      ondansetron 8 MG disintegrating tablet   Commonly known as: ZOFRAN-ODT   Take 8 mg by mouth every 8 (eight) hours as needed. For nausea.            Consults: IR  Procedures: Paracentesis and placement of percutaneous peritoneal catheter by Dr.Hoss 06/16/11   Brief H and P: HPI: A 41 year old female with disseminated metastatic endometrial cancer which was recently hospitalized with febrile neutropenia. Patient was discharged home a few days ago wth hospice but returned today with abdominal distention and pain. During the last hospitalization she had paracentesis for malignant ascites. She felt better but it seems like the ascites is now back. She has pain and distention the pain  rated as 6/10. Affecting her breathing and is making her uncomfortable. Patient is no longer on chemotherapy at this point and is currently a DO NOT RESUSCITATE. She is being followed by oncologist and has a G-tube that is usually hooked up to suction. Denied any fever or chills. No melena no bright red blood per rectum.   Hospital Course:  1. End stage endometrial cancer : She is actively dying, under the care of hospice services, will be Discharged to residential hospice, On IV dilaudid via Cadd pump 2. 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 Partial SBO: secondary to underlying malignancy and metastasis, has a G tube that can be hooked up to intermittent suction as needed, has been tolerating liquids PO during hospitalization 4. ARF : improved with IVF and decompression with paracentesis  At this time being discharged to residential hospice for End of life care of her Terminal Cancer  Time spent on Discharge:  Signed: Ronnesha Mester [facility] Pager: [phone] 06/19/2011, 8:23 AM

## 2011-06-26 ENCOUNTER — Telehealth: Payer: Self-pay

## 2011-06-26 NOTE — Telephone Encounter (Signed)
Faxed signed orders dated 06-26-11 to [facility].  Sent a copy to medical records to scan copy into pt.'s chart.

## 2011-06-28 ENCOUNTER — Other Ambulatory Visit: Payer: Self-pay | Admitting: Oncology

## 2011-06-29 ENCOUNTER — Ambulatory Visit: Admission: RE | Admit: 2011-06-29 | Payer: Self-pay | Source: Ambulatory Visit | Admitting: Radiation Oncology

## 2011-07-01 ENCOUNTER — Ambulatory Visit: Payer: Self-pay

## 2011-07-01 ENCOUNTER — Ambulatory Visit: Payer: Self-pay | Admitting: Radiation Oncology

## 2011-07-13 DEATH — deceased

## 2011-07-22 ENCOUNTER — Ambulatory Visit: Payer: Self-pay

## 2011-08-12 ENCOUNTER — Ambulatory Visit: Payer: Self-pay

## 2011-08-19 ENCOUNTER — Other Ambulatory Visit: Payer: Self-pay | Admitting: Lab

## 2011-08-19 ENCOUNTER — Ambulatory Visit: Payer: Self-pay | Admitting: Oncology

## 2013-01-31 ENCOUNTER — Telehealth: Payer: Self-pay | Admitting: Radiation Oncology

## 2013-01-31 NOTE — Telephone Encounter (Signed)
Krystal [name] showed up today wanting her records.  Per Patric Dykes, the compliance officer states that we need a copy of the marriage certificate to prove they were married.  I advised him of this today.

## 2013-05-21 IMAGING — CT CT ANGIO NECK
1 of 8 series · 6 of 33 positions shown · IV contrast (omnipaque)
Comparison: None

CLINICAL DATA: Neck pain.  Headache.  Uterine cancer.

CT ANGIOGRAPHY NECK
TECHNIQUE: Multidetector CT imaging of the neck was performed
using the standard protocol during bolus administration of
intravenous contrast.  Multiplanar CT image reconstructions
including MIPs were obtained to evaluate the vascular anatomy.
Carotid stenosis measurements (when applicable) are obtained
utilizing NASCET criteria, using the distal internal carotid
diameter as the denominator.
Contrast: 50mL OMNIPAQUE IOHEXOL 350 MG/ML IV SOLN

[mpr, ax 1x1 mpr, axial · axial · 0.43mm/px · z∈[+122,+272]mm · 6 of 210 slices shown]
[im 30/210  soft-tissue]
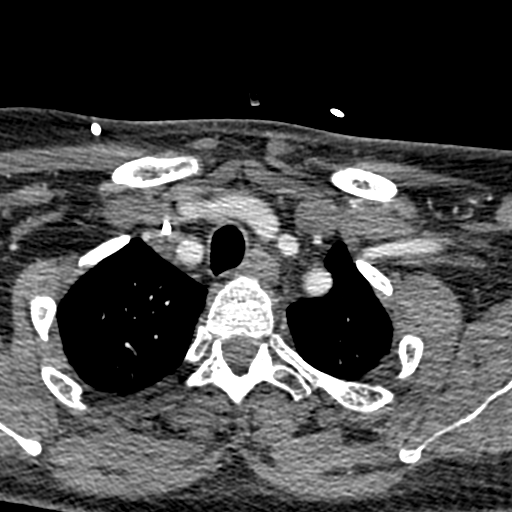
[im 60/210  bone]
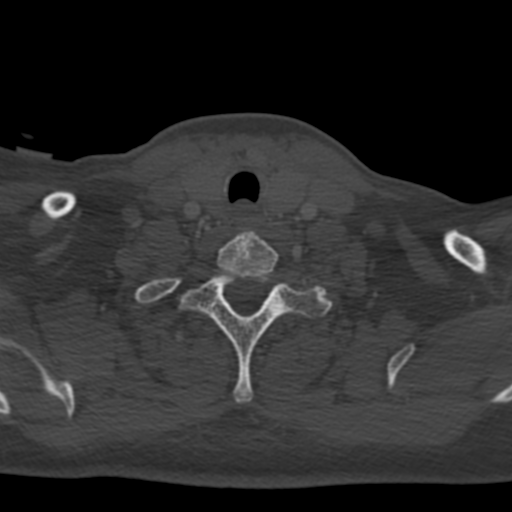
[im 90/210  soft-tissue]
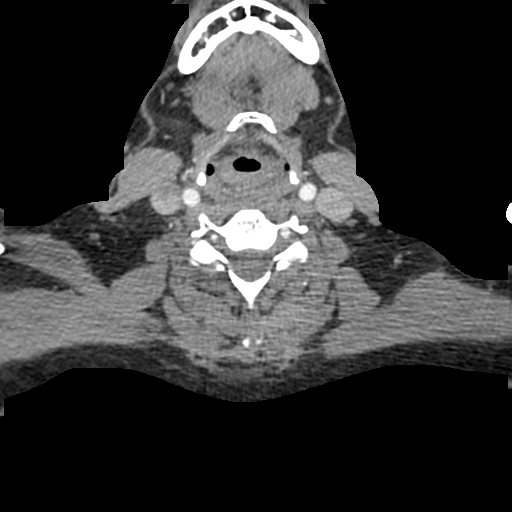
[im 120/210  bone]
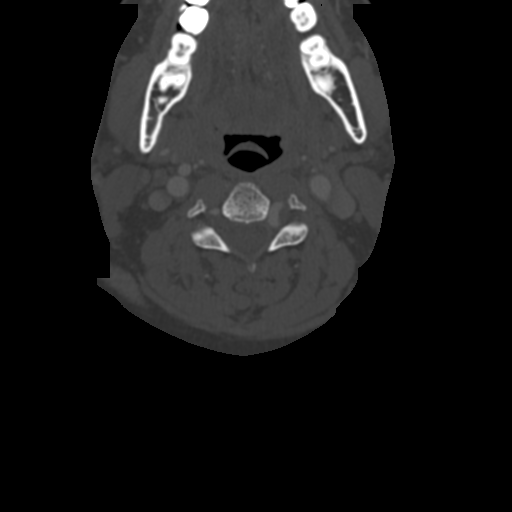
[im 150/210  soft-tissue]
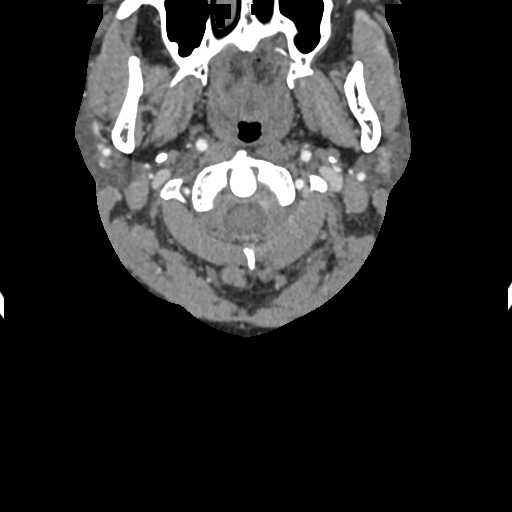
[im 180/210  bone]
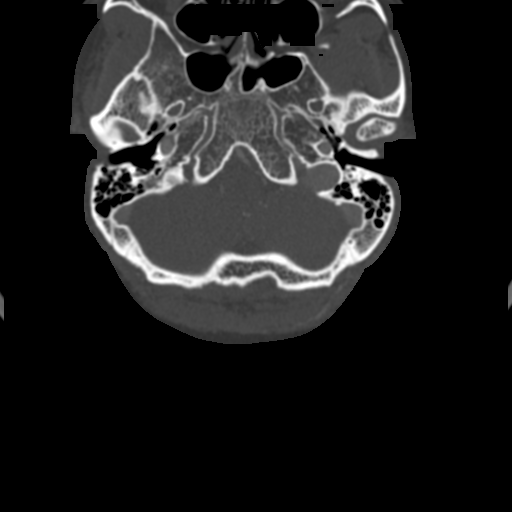

[6 of 33 positions shown; findings below may reference images not displayed]

FINDINGS: Branching pattern of the brachiocephalic vessels from
the arch is normal.  The aorta is ectatic.  No brachiocephalic
vessel origin stenoses.

The right common carotid arteries widely patent to the bifurcation.
The bifurcation is normal.  No atherosclerotic change.  No
fibromuscular disease.  The vessel was patent into the brain.

The left common carotid artery does not show any atherosclerotic
change, dissection or fibromuscular change.  The bifurcation region
is normal.  This vessels also patent into the brain.

Both vertebral artery origins appear patent and normal with the
left being larger than the right.  Both vessels are patent through
the cervical region though they are tortuous.  No stenosis,
dissection or fibromuscular change.  Both vessels are patent into
the posterior fossa.

Jugular veins appear normal.  Soft tissues of the neck appear
normal.  No mass or adenopathy.  No significant bony finding.

 Review of the MIP images confirms the above findings.
IMPRESSION: The arteries are tortuous but there is no stenosis or dissection.
No soft tissue lesion of the neck is identified.

## 2013-10-07 IMAGING — CR DG ABDOMEN 1V
1 series · 1 of 1 positions shown · non-contrast
Comparison: Abdomen film of 05/11/2011

CLINICAL DATA: Evaluate NG tube placement, vomiting

ABDOMEN - 1 VIEW

[ap (kub)]
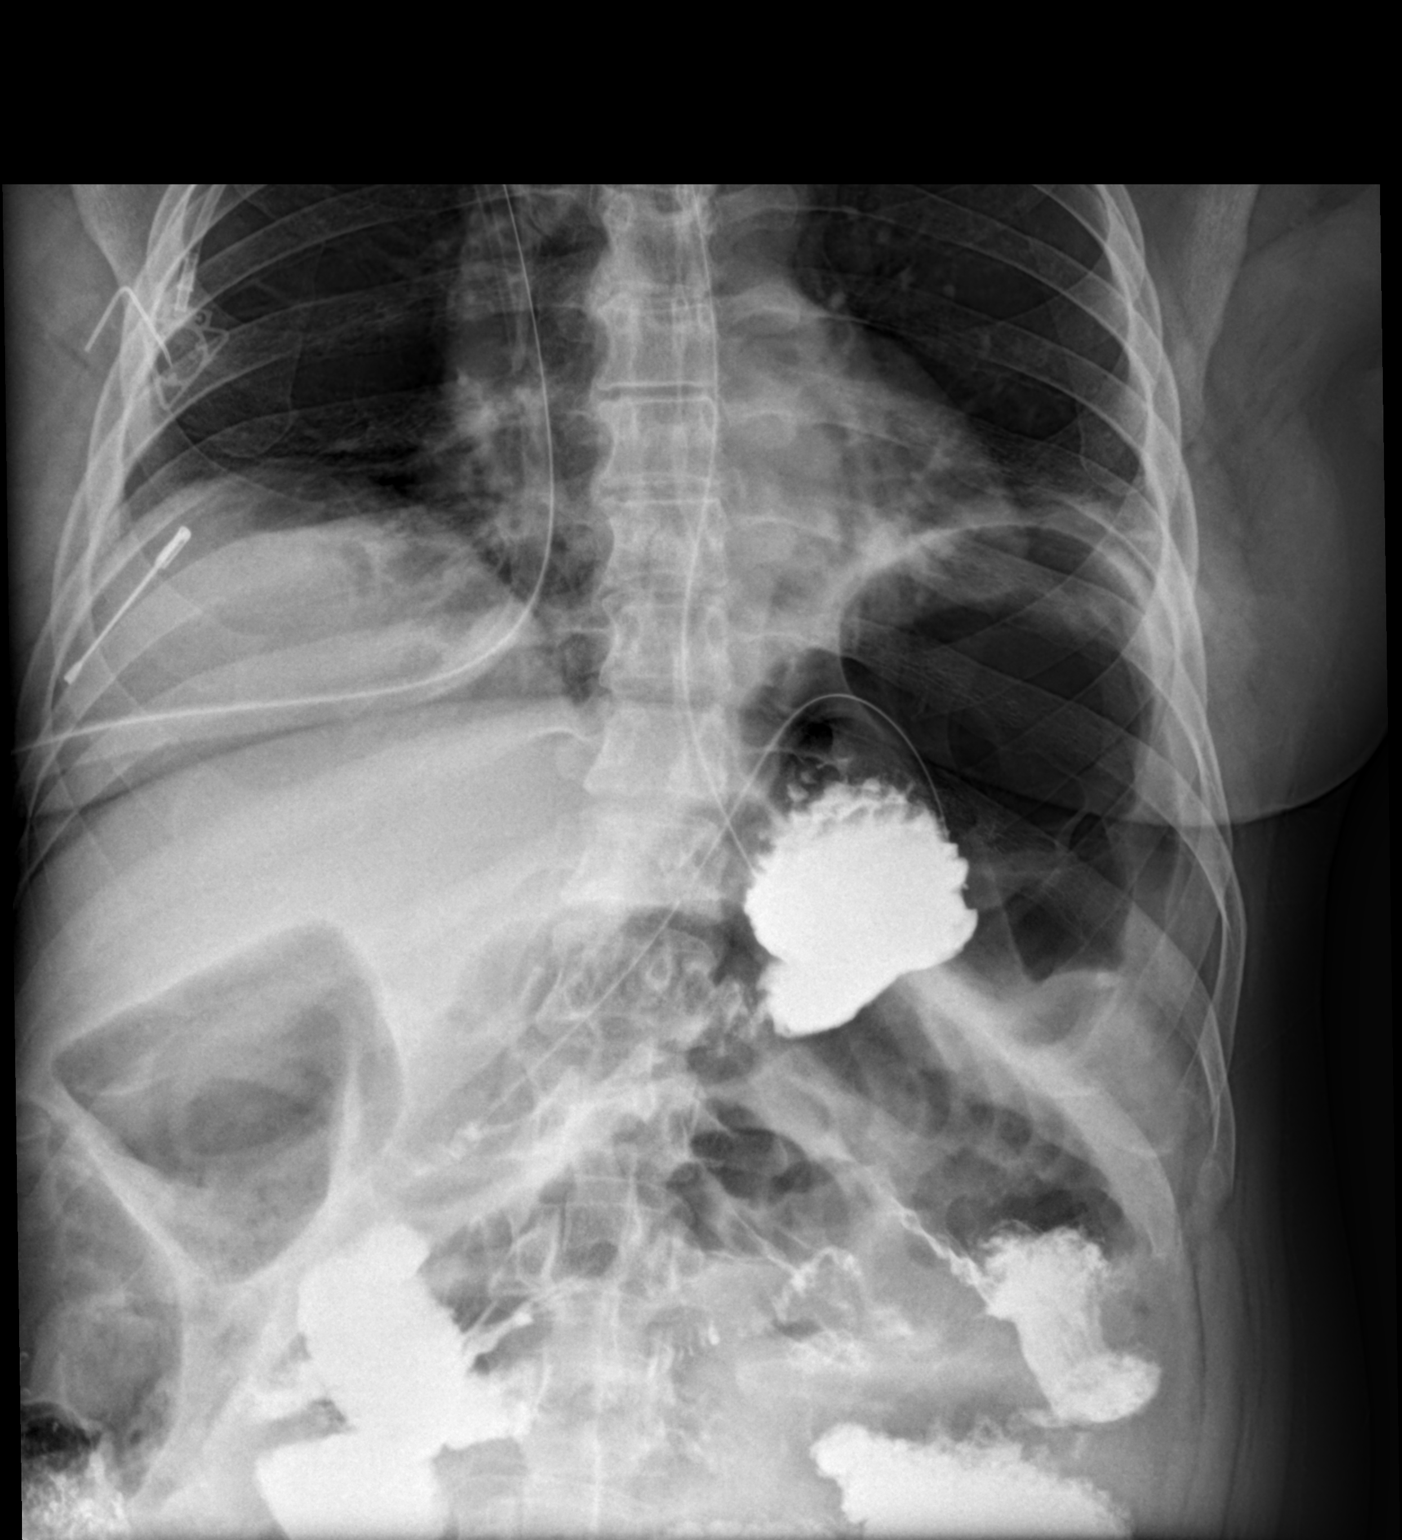

[1 of 1 positions shown; findings below may reference images not displayed]

FINDINGS: The NG tube coils in the fundus of the stomach with the
tip extending into the antrum of the stomach.  Some contrast layers
within the fundus of the stomach.  Dilated small bowel loops again
are noted, and there is left basilar atelectasis present.
IMPRESSION: NG tube tip in the antrum of the stomach.  There is little change
in gaseous distention of small bowel loops.

## 2013-10-07 IMAGING — US IR PARACENTESIS
1 series · 1 of 1 positions shown · non-contrast
Comparison: none

CLINICAL HISTORY: 41-year-old with metastatic endometrial cancer.
The patient has a small bowel obstruction and a gastrostomy tube
has been requested for bowel decompression.

[Series 1: sp percut gastrostomy tube insert w/fluoro · 1 of 1 slices shown]
[im 1/1]
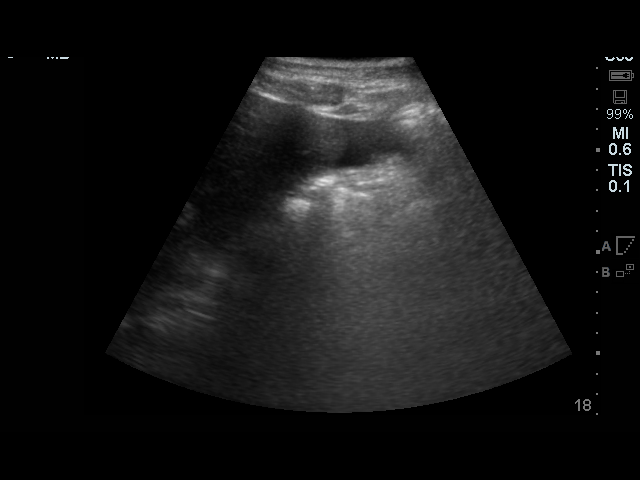

[1 of 1 positions shown; findings below may reference images not displayed]

PROCEDURE(S): ULTRASOUND GUIDED PARACENTESIS; FLUOROSCOPIC GUIDED
PLACEMENT OF NASOGASTRIC TUBE

Medications:None

Moderate sedation time:None

Fluoroscopy time: 0.9 minutes

Procedure:Informed consent was obtained for a paracentesis and
gastrostomy tube placement.  The patient was evaluated with
ultrasound and fluoroscopy.  Ultrasound demonstrated a large amount
of abdominal ascites.  Fluoroscopy demonstrated barium within the
stomach with dilated loops of small bowel.

The right side of the abdomen was prepped and draped in a sterile
fashion. The skin was anesthetized with lidocaine.  Using
ultrasound guidance, a 5-French Yueh catheter was directed into the
peritoneal fluid.  2 liters of yellowish-green ascites was removed.

The right nostril was anesthetized with viscous lidocaine.  14-
French nasogastric tube was inserted and advanced in the stomach.
Positioning was confirmed with fluoroscopy.  The tube was secured
to the nose.
FINDINGS: Large amount of ascites.  2 liters of fluid was removed.
Barium within the stomach and dilated small bowel loops. Gas-filled
structures throughout the abdomen may represent colon and small
bowel loops.

Complications: None
IMPRESSION: Successful ultrasound guided paracentesis.  2 liters of
fluid was removed.

The patient is scheduled for a percutaneous gastrostomy tube.
However, there is barium within the stomach which is not ideal for
placement of a gastrostomy tube.  As a result, a nasogastric tube
was placed in order to remove the barium and decompress the dilated
bowel loops.  The patient will be evaluated again on May 13, 2011
for the gastrostomy tube placement.

## 2013-11-13 ENCOUNTER — Encounter (HOSPITAL_COMMUNITY): Payer: Self-pay | Admitting: Radiology
